# Patient Record
Sex: Female | Born: 1960 | Race: Black or African American | Hispanic: No | Marital: Single | Smoking: Never smoker
Health system: Southern US, Community
[De-identification: ages and names within clinical notes are randomized; demographics above are authoritative.]

## PROBLEM LIST (undated history)

## (undated) DIAGNOSIS — T7840XA Allergy, unspecified, initial encounter: Secondary | ICD-10-CM

## (undated) DIAGNOSIS — K5900 Constipation, unspecified: Secondary | ICD-10-CM

## (undated) DIAGNOSIS — M1990 Unspecified osteoarthritis, unspecified site: Secondary | ICD-10-CM

## (undated) DIAGNOSIS — T8859XA Other complications of anesthesia, initial encounter: Secondary | ICD-10-CM

## (undated) DIAGNOSIS — J45909 Unspecified asthma, uncomplicated: Secondary | ICD-10-CM

## (undated) DIAGNOSIS — H9193 Unspecified hearing loss, bilateral: Secondary | ICD-10-CM

## (undated) DIAGNOSIS — R32 Unspecified urinary incontinence: Secondary | ICD-10-CM

## (undated) DIAGNOSIS — T4145XA Adverse effect of unspecified anesthetic, initial encounter: Secondary | ICD-10-CM

## (undated) DIAGNOSIS — R12 Heartburn: Secondary | ICD-10-CM

## (undated) DIAGNOSIS — N946 Dysmenorrhea, unspecified: Secondary | ICD-10-CM

## (undated) DIAGNOSIS — J189 Pneumonia, unspecified organism: Secondary | ICD-10-CM

## (undated) DIAGNOSIS — F53 Postpartum depression: Secondary | ICD-10-CM

## (undated) DIAGNOSIS — E669 Obesity, unspecified: Secondary | ICD-10-CM

## (undated) DIAGNOSIS — G43909 Migraine, unspecified, not intractable, without status migrainosus: Secondary | ICD-10-CM

## (undated) DIAGNOSIS — E7800 Pure hypercholesterolemia, unspecified: Secondary | ICD-10-CM

## (undated) DIAGNOSIS — E119 Type 2 diabetes mellitus without complications: Secondary | ICD-10-CM

## (undated) DIAGNOSIS — Q632 Ectopic kidney: Secondary | ICD-10-CM

## (undated) DIAGNOSIS — K635 Polyp of colon: Secondary | ICD-10-CM

## (undated) DIAGNOSIS — O99345 Other mental disorders complicating the puerperium: Secondary | ICD-10-CM

## (undated) DIAGNOSIS — D649 Anemia, unspecified: Secondary | ICD-10-CM

## (undated) HISTORY — DX: Migraine, unspecified, not intractable, without status migrainosus: G43.909

## (undated) HISTORY — DX: Unspecified asthma, uncomplicated: J45.909

## (undated) HISTORY — DX: Ectopic kidney: Q63.2

## (undated) HISTORY — DX: Constipation, unspecified: K59.00

## (undated) HISTORY — DX: Postpartum depression: F53.0

## (undated) HISTORY — DX: Obesity, unspecified: E66.9

## (undated) HISTORY — DX: Pure hypercholesterolemia, unspecified: E78.00

## (undated) HISTORY — DX: Polyp of colon: K63.5

## (undated) HISTORY — DX: Unspecified osteoarthritis, unspecified site: M19.90

## (undated) HISTORY — DX: Pneumonia, unspecified organism: J18.9

## (undated) HISTORY — DX: Type 2 diabetes mellitus without complications: E11.9

## (undated) HISTORY — DX: Allergy, unspecified, initial encounter: T78.40XA

## (undated) HISTORY — DX: Other mental disorders complicating the puerperium: O99.345

## (undated) HISTORY — DX: Unspecified urinary incontinence: R32

## (undated) HISTORY — DX: Anemia, unspecified: D64.9

## (undated) HISTORY — PX: HEMORRHOID BANDING: SHX5850

## (undated) HISTORY — DX: Heartburn: R12

## (undated) HISTORY — PX: TYMPANOSTOMY TUBE PLACEMENT: SHX32

## (undated) HISTORY — DX: Dysmenorrhea, unspecified: N94.6

---

## 1999-04-21 ENCOUNTER — Other Ambulatory Visit: Admission: RE | Admit: 1999-04-21 | Discharge: 1999-05-21 | Payer: Self-pay | Admitting: *Deleted

## 1999-04-21 ENCOUNTER — Encounter: Admission: RE | Admit: 1999-04-21 | Discharge: 1999-04-21 | Payer: Self-pay | Admitting: Family Medicine

## 1999-05-26 ENCOUNTER — Encounter: Admission: RE | Admit: 1999-05-26 | Discharge: 1999-05-26 | Payer: Self-pay | Admitting: Family Medicine

## 1999-10-15 ENCOUNTER — Encounter: Admission: RE | Admit: 1999-10-15 | Discharge: 1999-10-15 | Payer: Self-pay | Admitting: Family Medicine

## 2000-02-07 ENCOUNTER — Encounter: Admission: RE | Admit: 2000-02-07 | Discharge: 2000-02-07 | Payer: Self-pay | Admitting: Family Medicine

## 2000-04-19 ENCOUNTER — Encounter: Admission: RE | Admit: 2000-04-19 | Discharge: 2000-04-19 | Payer: Self-pay | Admitting: Family Medicine

## 2000-06-12 ENCOUNTER — Encounter: Admission: RE | Admit: 2000-06-12 | Discharge: 2000-06-12 | Payer: Self-pay | Admitting: Family Medicine

## 2000-09-26 ENCOUNTER — Encounter: Payer: Self-pay | Admitting: Emergency Medicine

## 2000-09-26 ENCOUNTER — Emergency Department (HOSPITAL_COMMUNITY): Admission: EM | Admit: 2000-09-26 | Discharge: 2000-09-26 | Payer: Self-pay | Admitting: Emergency Medicine

## 2000-09-28 ENCOUNTER — Encounter: Admission: RE | Admit: 2000-09-28 | Discharge: 2000-09-28 | Payer: Self-pay | Admitting: Family Medicine

## 2000-12-06 ENCOUNTER — Encounter: Admission: RE | Admit: 2000-12-06 | Discharge: 2000-12-06 | Payer: Self-pay | Admitting: Family Medicine

## 2000-12-06 ENCOUNTER — Encounter: Payer: Self-pay | Admitting: Sports Medicine

## 2000-12-06 ENCOUNTER — Encounter: Admission: RE | Admit: 2000-12-06 | Discharge: 2000-12-06 | Payer: Self-pay | Admitting: Sports Medicine

## 2000-12-12 ENCOUNTER — Encounter: Admission: RE | Admit: 2000-12-12 | Discharge: 2000-12-12 | Payer: Self-pay | Admitting: Family Medicine

## 2000-12-12 ENCOUNTER — Ambulatory Visit (HOSPITAL_COMMUNITY): Admission: RE | Admit: 2000-12-12 | Discharge: 2000-12-12 | Payer: Self-pay | Admitting: *Deleted

## 2000-12-19 ENCOUNTER — Encounter: Admission: RE | Admit: 2000-12-19 | Discharge: 2000-12-19 | Payer: Self-pay | Admitting: *Deleted

## 2000-12-26 ENCOUNTER — Encounter: Admission: RE | Admit: 2000-12-26 | Discharge: 2000-12-26 | Payer: Self-pay | Admitting: Family Medicine

## 2001-01-02 ENCOUNTER — Encounter: Admission: RE | Admit: 2001-01-02 | Discharge: 2001-01-22 | Payer: Self-pay | Admitting: Sports Medicine

## 2001-01-16 ENCOUNTER — Encounter: Admission: RE | Admit: 2001-01-16 | Discharge: 2001-01-16 | Payer: Self-pay | Admitting: Family Medicine

## 2001-04-16 ENCOUNTER — Encounter: Admission: RE | Admit: 2001-04-16 | Discharge: 2001-04-16 | Payer: Self-pay | Admitting: Family Medicine

## 2001-04-18 ENCOUNTER — Encounter: Admission: RE | Admit: 2001-04-18 | Discharge: 2001-04-18 | Payer: Self-pay | Admitting: Family Medicine

## 2001-04-25 ENCOUNTER — Encounter: Admission: RE | Admit: 2001-04-25 | Discharge: 2001-04-25 | Payer: Self-pay | Admitting: Family Medicine

## 2001-05-15 ENCOUNTER — Other Ambulatory Visit: Admission: RE | Admit: 2001-05-15 | Discharge: 2001-05-15 | Payer: Self-pay | Admitting: Internal Medicine

## 2003-02-28 ENCOUNTER — Encounter: Payer: Self-pay | Admitting: Internal Medicine

## 2003-02-28 ENCOUNTER — Other Ambulatory Visit: Admission: RE | Admit: 2003-02-28 | Discharge: 2003-02-28 | Payer: Self-pay | Admitting: Internal Medicine

## 2003-02-28 LAB — CONVERTED CEMR LAB

## 2005-07-29 ENCOUNTER — Encounter: Admission: RE | Admit: 2005-07-29 | Discharge: 2005-07-29 | Payer: Self-pay | Admitting: Internal Medicine

## 2006-08-02 ENCOUNTER — Encounter: Admission: RE | Admit: 2006-08-02 | Discharge: 2006-08-02 | Payer: Self-pay | Admitting: Internal Medicine

## 2006-08-11 ENCOUNTER — Encounter: Admission: RE | Admit: 2006-08-11 | Discharge: 2006-08-11 | Payer: Self-pay | Admitting: Internal Medicine

## 2006-12-12 ENCOUNTER — Ambulatory Visit: Payer: Self-pay | Admitting: Internal Medicine

## 2006-12-12 LAB — CONVERTED CEMR LAB
AST: 16 units/L (ref 0–37)
Albumin: 3.4 g/dL — ABNORMAL LOW (ref 3.5–5.2)
Alkaline Phosphatase: 64 units/L (ref 39–117)
Basophils Relative: 0.2 % (ref 0.0–1.0)
Bilirubin, Direct: 0.1 mg/dL (ref 0.0–0.3)
Cholesterol: 161 mg/dL (ref 0–200)
Creatinine, Ser: 1 mg/dL (ref 0.4–1.2)
GFR calc Af Amer: 77 mL/min
GFR calc non Af Amer: 64 mL/min
HCT: 34.3 % — ABNORMAL LOW (ref 36.0–46.0)
HDL: 43.8 mg/dL (ref >39.0)
Hemoglobin: 11.5 g/dL — ABNORMAL LOW (ref 12.0–15.0)
MCHC: 33.5 g/dL (ref 30.0–36.0)
MCV: 73.3 fL — ABNORMAL LOW (ref 78.0–100.0)
Neutrophils Relative %: 59.1 % (ref 43.0–77.0)
RBC: 4.68 M/uL (ref 3.87–5.11)
RDW: 16.6 % — ABNORMAL HIGH (ref 11.5–14.6)
Sodium: 140 meq/L (ref 135–145)
Total CHOL/HDL Ratio: 3.7
WBC: 6 10*3/uL (ref 4.5–10.5)

## 2006-12-19 ENCOUNTER — Ambulatory Visit: Payer: Self-pay | Admitting: Internal Medicine

## 2007-01-31 ENCOUNTER — Encounter: Payer: Self-pay | Admitting: Internal Medicine

## 2007-01-31 DIAGNOSIS — N943 Premenstrual tension syndrome: Secondary | ICD-10-CM | POA: Insufficient documentation

## 2007-01-31 DIAGNOSIS — J45909 Unspecified asthma, uncomplicated: Secondary | ICD-10-CM | POA: Insufficient documentation

## 2007-01-31 DIAGNOSIS — K219 Gastro-esophageal reflux disease without esophagitis: Secondary | ICD-10-CM | POA: Insufficient documentation

## 2007-03-12 ENCOUNTER — Ambulatory Visit: Payer: Self-pay | Admitting: Internal Medicine

## 2007-03-12 ENCOUNTER — Telehealth: Payer: Self-pay | Admitting: Internal Medicine

## 2007-03-12 DIAGNOSIS — T753XXA Motion sickness, initial encounter: Secondary | ICD-10-CM | POA: Insufficient documentation

## 2007-04-16 ENCOUNTER — Telehealth: Payer: Self-pay | Admitting: Internal Medicine

## 2007-05-14 ENCOUNTER — Other Ambulatory Visit: Admission: RE | Admit: 2007-05-14 | Discharge: 2007-05-14 | Payer: Self-pay | Admitting: Obstetrics and Gynecology

## 2007-06-01 ENCOUNTER — Ambulatory Visit: Payer: Self-pay | Admitting: Internal Medicine

## 2007-06-01 DIAGNOSIS — G479 Sleep disorder, unspecified: Secondary | ICD-10-CM | POA: Insufficient documentation

## 2007-07-13 ENCOUNTER — Encounter: Payer: Self-pay | Admitting: Internal Medicine

## 2007-07-13 ENCOUNTER — Ambulatory Visit (HOSPITAL_BASED_OUTPATIENT_CLINIC_OR_DEPARTMENT_OTHER): Admission: RE | Admit: 2007-07-13 | Discharge: 2007-07-13 | Payer: Self-pay | Admitting: Internal Medicine

## 2007-07-25 ENCOUNTER — Encounter: Payer: Self-pay | Admitting: Critical Care Medicine

## 2007-07-25 ENCOUNTER — Ambulatory Visit: Payer: Self-pay | Admitting: Pulmonary Disease

## 2007-07-30 ENCOUNTER — Encounter: Payer: Self-pay | Admitting: Internal Medicine

## 2007-11-20 ENCOUNTER — Telehealth: Payer: Self-pay | Admitting: Internal Medicine

## 2007-11-20 ENCOUNTER — Ambulatory Visit: Payer: Self-pay | Admitting: Internal Medicine

## 2007-12-07 ENCOUNTER — Telehealth: Payer: Self-pay | Admitting: Internal Medicine

## 2009-04-17 ENCOUNTER — Encounter: Payer: Self-pay | Admitting: *Deleted

## 2009-04-17 ENCOUNTER — Encounter: Payer: Self-pay | Admitting: Internal Medicine

## 2009-04-17 LAB — CONVERTED CEMR LAB
Creatinine, Ser: 0.92 mg/dL
LDL Cholesterol: 117 mg/dL
TSH: 0.937 microintl units/mL
Triglycerides: 50 mg/dL

## 2009-04-22 ENCOUNTER — Telehealth: Payer: Self-pay | Admitting: *Deleted

## 2009-05-19 ENCOUNTER — Ambulatory Visit: Payer: Self-pay | Admitting: Internal Medicine

## 2009-05-19 DIAGNOSIS — G4733 Obstructive sleep apnea (adult) (pediatric): Secondary | ICD-10-CM | POA: Insufficient documentation

## 2009-05-19 DIAGNOSIS — R5381 Other malaise: Secondary | ICD-10-CM | POA: Insufficient documentation

## 2009-05-19 DIAGNOSIS — R5383 Other fatigue: Secondary | ICD-10-CM

## 2009-05-19 DIAGNOSIS — E119 Type 2 diabetes mellitus without complications: Secondary | ICD-10-CM | POA: Insufficient documentation

## 2009-05-19 LAB — CONVERTED CEMR LAB: Blood Glucose, Fingerstick: 146

## 2009-05-22 LAB — CONVERTED CEMR LAB
CO2: 27 meq/L (ref 19–32)
GFR calc non Af Amer: 86.26 mL/min (ref >60)
Microalb Creat Ratio: 2.1 mg/g (ref 0.0–30.0)
Microalb, Ur: 0.3 mg/dL (ref 0.0–1.9)
Potassium: 4.1 meq/L (ref 3.5–5.1)

## 2009-06-11 ENCOUNTER — Encounter: Payer: Self-pay | Admitting: *Deleted

## 2009-06-18 ENCOUNTER — Ambulatory Visit: Payer: Self-pay | Admitting: Internal Medicine

## 2009-06-18 DIAGNOSIS — R1013 Epigastric pain: Secondary | ICD-10-CM | POA: Insufficient documentation

## 2009-06-23 ENCOUNTER — Ambulatory Visit: Payer: Self-pay | Admitting: Internal Medicine

## 2009-06-23 ENCOUNTER — Encounter: Admission: RE | Admit: 2009-06-23 | Discharge: 2009-06-23 | Payer: Self-pay | Admitting: Internal Medicine

## 2009-07-30 ENCOUNTER — Ambulatory Visit: Payer: Self-pay | Admitting: Internal Medicine

## 2009-07-30 DIAGNOSIS — R0989 Other specified symptoms and signs involving the circulatory and respiratory systems: Secondary | ICD-10-CM

## 2009-07-30 DIAGNOSIS — M25519 Pain in unspecified shoulder: Secondary | ICD-10-CM | POA: Insufficient documentation

## 2009-07-30 DIAGNOSIS — R0609 Other forms of dyspnea: Secondary | ICD-10-CM | POA: Insufficient documentation

## 2009-07-30 LAB — CONVERTED CEMR LAB: Hemoglobin: 13.3 g/dL

## 2009-08-12 ENCOUNTER — Ambulatory Visit: Payer: Self-pay | Admitting: Pulmonary Disease

## 2009-08-14 ENCOUNTER — Ambulatory Visit: Payer: Self-pay | Admitting: Pulmonary Disease

## 2009-08-21 ENCOUNTER — Ambulatory Visit: Payer: Self-pay | Admitting: Pulmonary Disease

## 2009-08-26 ENCOUNTER — Ambulatory Visit (HOSPITAL_COMMUNITY): Admission: RE | Admit: 2009-08-26 | Discharge: 2009-08-26 | Payer: Self-pay | Admitting: Pulmonary Disease

## 2009-09-07 ENCOUNTER — Telehealth: Payer: Self-pay | Admitting: *Deleted

## 2009-12-28 ENCOUNTER — Telehealth: Payer: Self-pay | Admitting: *Deleted

## 2010-01-15 ENCOUNTER — Encounter: Admission: RE | Admit: 2010-01-15 | Discharge: 2010-01-15 | Payer: Self-pay | Admitting: Otolaryngology

## 2010-08-29 ENCOUNTER — Encounter: Payer: Self-pay | Admitting: Internal Medicine

## 2010-09-09 NOTE — Assessment & Plan Note (Signed)
Summary: consult for dyspnea   Copy to:  Berniece Andreas Primary Provider/Referring Provider:  Madelin Headings MD  CC:  Pulmonary Consult.  History of Present Illness: The pt is a 50y/o female who I have been asked to see for dyspnea.  She states intermittantly for the past 2 mos, she has had the feeling of sob, as well as chest heaviness with no restriction to airflow.  It occurs primarily with exertion, but it does not limit her ability to do things.  It is just "something I notice".  She describes a 3 block doe at moderate pace on flat ground, but denies any issues with housework or bringing groceries in from the car.  She notices her abnormal breathing the most when she is "stressed".  Her friends will often ask her why she is breathing so heavy.  The pt has chronic LE edema, but denies any chest pain.  She tells me that her weight has been neutral over the past one year.  There is a question whether she has had asthma in the past.  Her recent cxr was unremarkable.  Current Medications (verified): 1)  Norco 5-325 Mg  Tabs (Hydrocodone-Acetaminophen) .Marland Kitchen.. 1-2 By Mouth Q4-6 Hours As Needed  Headache 2)  Maxalt-Mlt 10 Mg  Tbdp (Rizatriptan Benzoate) .Marland Kitchen.. 1 By Mouth As Needed For Headache. Can Repeat in 2 Hours. 3)  Zyrtec Hives Relief 10 Mg Tabs (Cetirizine Hcl) .... As Needed 4)  Naproxen 500 Mg Tabs (Naproxen) .Marland Kitchen.. 1 By Mouth Two Times A Day For Shoulder Neck Pain  Allergies (verified): No Known Drug Allergies  Past History:  Past Medical History: Reviewed history from 07/30/2009 and no changes required. 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 Family History of Arthritis Family History Diabetes gm Family History Hypertension Father :   deceased from MVA   Mom   arthrits  HT   allergies: mother asthma: mother heart disease: maternal grandmother clotting disorders: maternal grandmother (stroke)   Social History: Reviewed history from 07/30/2009 and no changes required. Single pt has child(ren) pt works as Theme park manager  Never Smoked Alcohol use-no Drug use-no  HH of 3   no pets   Review of Systems       The patient complains of shortness of breath with activity, shortness of breath at rest, sore throat, headaches, and nasal congestion/difficulty breathing through nose.  The patient denies productive cough, non-productive cough, coughing up blood, chest pain, irregular heartbeats, acid heartburn, indigestion, loss of appetite, weight change, abdominal pain, difficulty swallowing, tooth/dental problems, sneezing, itching, ear ache, anxiety, depression, hand/feet swelling, joint stiffness or pain, rash, change in color of mucus, and fever.    Vital Signs:  Patient profile:   50 year old female Menstrual status:  irregular Height:      62 inches Weight:      267.25 pounds BMI:     49.06 O2 Sat:      97 % on Room air Temp:     98.3 degrees F oral Pulse rate:   108 / minute BP sitting:   116 / 80  (left arm) Cuff size:   large  Vitals Entered By: Arman Filter LPN (  August 12, 2009 2:30 PM)  O2 Flow:  Room air CC: Pulmonary Consult Comments Medications reviewed with patient Arman Filter LPN  August 12, 2009 2:41 PM    Physical Exam  General:  obese female in nad Eyes:  PERRLA and EOMI.   Nose:  patent without discharge, mild turbinate hypertrophy Mouth:  elongation of soft palate and uvula Neck:  no jvd, LN, tmg Lungs:  totally clear to auscultation Heart:  rrr, no mrg Abdomen:  soft and nontender, bs+ Extremities:  1+ edema with venous  stasis changes no cyanosis pulses intact distally Neurologic:  alert and oriented, moves all 4.   Impression & Recommendations:  Problem # 1:  DYSPNEA ON EXERTION (ICD-786.09) the pt is having a "sensation" of shortness of breath after heavier exertional activities, but does not feel it limits her activities in anyway.  She has a clear cxr and lung exam, but does have centripetal obesity that more than likely limits diaphragm excursion.  At this point, I think it is worthwhile to do pfts to make sure she doesn't have airflow obstruction (i.e. asthma) or a significant DLCO abnormality.  If these are ok, I would recommend working on weight loss and some type of exercise program for 3-4 months.  If she is successful, but her symptoms persist, would do echo for completeness (also to rule out pulmonary htn).  The pt is agreeable to this approach.  Medications Added to Medication List This Visit: 1)  Zyrtec Hives Relief 10 Mg Tabs (Cetirizine hcl) .... As needed  Other Orders: Consultation Level IV (82956) Pulmonary Referral (Pulmonary)  Patient Instructions: 1)  will set up for breathing studies, and call you with the results 2)  work on weight loss and conditioning.

## 2010-09-09 NOTE — Miscellaneous (Signed)
Summary: Orders Update pft charges  Clinical Lists Changes  Orders: Added new Service order of Carbon Monoxide diffusing w/capacity [phone]) - Signed Added new Service order of Lung Volumes (57846) - Signed Added new Service order of Spirometry (Pre & Post) [phone]) - Signed  Appended Document: Orders Update pft charges megan, pt needs ov to discuss pft results.  Appended Document: Orders Update pft charges [employer]  Appended Document: Orders Update pft charges called and spoke with pt.  pt scheduled to see [facility] tomorrow at 2pm

## 2010-09-09 NOTE — Assessment & Plan Note (Signed)
Summary: ov to discuss PFT results/mg   Copy to:  Berniece Andreas Primary Provider/Referring Provider:  Madelin Headings MD  CC:  Pt is here for a f/u appt to discuss PFT results.  .  History of Present Illness: The pt comes in today for f/u of her recent pfts as part of a workup for dyspnea.  She was found to have normal spirometry and no response to bronchodilators.  Her lung volumes showed no restriction, but there was mild airtrapping.  Her DLCO was moderately reduced, but corrected to normal with alveolar volume adjustment.  I have gone over the study with her in detail, and answered all of her questions.  Current Medications (verified): 1)  Norco 5-325 Mg  Tabs (Hydrocodone-Acetaminophen) .Marland Kitchen.. 1-2 By Mouth Q4-6 Hours As Needed  Headache 2)  Maxalt-Mlt 10 Mg  Tbdp (Rizatriptan Benzoate) .Marland Kitchen.. 1 By Mouth As Needed For Headache. Can Repeat in 2 Hours. 3)  Zyrtec Hives Relief 10 Mg Tabs (Cetirizine Hcl) .... As Needed 4)  Naproxen 500 Mg Tabs (Naproxen) .Marland Kitchen.. 1 By Mouth Two Times A Day For Shoulder Neck Pain 5)  Metformin Hcl 500 Mg Tabs (Metformin Hcl) .... Take 1 Tablet By Mouth Once A Day  Allergies (verified): No Known Drug Allergies  Vital Signs:  Patient profile:   50 year old female Menstrual status:  irregular Height:      62 inches Weight:      268 pounds BMI:     49.20 O2 Sat:      97 % on Room air Temp:     98.1 degrees F oral Pulse rate:   95 / minute BP sitting:   118 / 84  (left arm) Cuff size:   large  Vitals Entered By: Arman Filter LPN (August 21, 2009 1:58 PM)  O2 Flow:  Room air CC: Pt is here for a f/u appt to discuss PFT results.   Comments Medications reviewed with patient Arman Filter LPN  August 21, 2009 1:59 PM     Impression & Recommendations:  Problem # 1:  DYSPNEA ON EXERTION (ICD-786.09) the pt has no obvious obstruction by her spirometry, but does have mild airtrapping on her lung volumes.  This raises the question whether she may have  occult obstructive airways disease, but it is far from straightforward.  I think it is worthwhile to try her on an inhaler for asthma and see if she has improvement.  My other concern is that her DLCO is abnormal, and she has issues with LE edema.  I think we need to exclude the possibility of chronic thromboembolic disease, and will check v/q scan.  If the pt does not improve with her inhaler, and the v/q is ok, would do echo to r/o the possibility of pulmonary htn.  She is agreeable to this approach.  Time spent with pt today was .  Medications Added to Medication List This Visit: 1)  Metformin Hcl 500 Mg Tabs (Metformin hcl) .... Take 1 tablet by mouth once a day  Other Orders: Est. Patient Level III (29562) Radiology Referral (Radiology)  Patient Instructions: 1)  will start on dulera 100/58mcg  2 inhalations am and pm...rinse mouth well. 2)  will schedule for lung scan to exclude small blood clots.  I will call with results. 3)  call me in 3 weeks with response to dulera.

## 2010-09-09 NOTE — Progress Notes (Signed)
Summary: machine & strips  Phone Note Call from Patient Call back at Home Phone [phone]   Caller: vm Call For: panosh Summary of Call: Have coupon for free meter One Touch Ultra Mini machine & also need strips One Touch Ultra Blue Test Strips.  Now that I'm diabetic need machine.   [facility] [address]   Initial call taken by: Rudy Jew, RN,  September 07, 2009 2:29 PM  Follow-up for Phone Call        Left message for pt to call back. I need to know how often she is testing her blood sugar so we can call in the rx. Follow-up by: Romualdo Bolk, CMA Duncan Dull),  September 07, 2009 4:46 PM  Additional Follow-up for Phone Call Additional follow up Details #1::        Pt wants to know how often you would like her to check her blood sugar? Additional Follow-up by: Romualdo Bolk, CMA (AAMA),  September 09, 2009 4:17 PM    Additional Follow-up for Phone Call Additional follow up Details #2::    two times a day for 1-2 weeks then daily at various times.  Follow-up by: Madelin Headings MD,  September 09, 2009 5:44 PM  Additional Follow-up for Phone Call Additional follow up Details #3:: Details for Additional Follow-up Action Taken: Rx sent to pharmacy. Pt aware Additional Follow-up by: Romualdo Bolk, CMA (AAMA),  September 10, 2009 11:22 AM  New/Updated Medications: ONETOUCH ULTRA MINI W/DEVICE KIT (BLOOD GLUCOSE MONITORING SUPPL) check blood sugar two times a day for 1-2 weeks then daily at various times ONETOUCH ULTRA TEST  STRP (GLUCOSE BLOOD) check blood sugar two times a day for 1-2 weeks then daily at various times Prescriptions: ONETOUCH ULTRA TEST  STRP (GLUCOSE BLOOD) check blood sugar two times a day for 1-2 weeks then daily at various times  #50 x 11   Entered by:   Romualdo Bolk, CMA (AAMA)   Authorized by:   Madelin Headings MD   Signed by:   Romualdo Bolk, CMA (AAMA) on 09/10/2009   Method used:   [facility]        [facility]  [address] [phone]* (retail)       [address]       [place], [place]  [id number]       Ph: [phone] or [phone]       Fax: [phone]   RxID:   [id number] [facility] ULTRA MINI W/DEVICE KIT (BLOOD GLUCOSE MONITORING SUPPL) check blood sugar two times a day for 1-2 weeks then daily at various times  #1 x 0   Entered by:   Romualdo Bolk, CMA (AAMA)   Authorized by:   Madelin Headings MD   Signed by:   Romualdo Bolk, CMA (AAMA) on 09/10/2009   Method used:   [facility]        [facility]  [address] [phone]* (retail)       [address]       [place], [place]  [id number]       Ph: [phone] or [phone]       Fax: [phone]   RxID:   [id number]

## 2010-09-09 NOTE — Progress Notes (Signed)
Summary: Anielle Headrick please call  Phone Note Call from Patient Call back at Home Phone [phone]   Caller: Patient Call For: Madelin Headings MD Summary of Call: Pt would like Wendell Fiebig to return her call concerning diabete machine.  Initial call taken by: Heron Sabins,  Dec 28, 2009 11:13 AM  Follow-up for Phone Call        Pt states that she has pays $200.00 up front before they will cover anything. Follow-up by: Romualdo Bolk, CMA Duncan Dull),  Dec 28, 2009 11:22 AM  Additional Follow-up for Phone Call Additional follow up Details #1::        I found a Accu-Chek that has a connect card. This card states that you pay no more than $15.00 for a prescription. I gave this to her with some test strips. Additional Follow-up by: Romualdo Bolk, CMA (AAMA),  Dec 28, 2009 11:59 AM    New/Updated Medications: ACCU-CHEK AVIVA  KIT (BLOOD GLUCOSE MONITORING SUPPL)

## 2010-12-21 NOTE — Procedures (Signed)
Jill [name], [place] NO.:  [id number]   MEDICAL RECORD NO.:  [id number]          PATIENT TYPE:  OUT   LOCATION:  SLEEP CENTER                 FACILITY:  [facility]   PHYSICIAN:  Barbaraann Share, MD,FCCPDATE OF BIRTH:  1960/11/05   DATE OF STUDY:  07/13/2007                            NOCTURNAL POLYSOMNOGRAM   REFERRING PHYSICIAN:  Neta Mends. Panosh, MD   INDICATION FOR STUDY:  Hypersomnia with sleep apnea.   EPWORTH SLEEPINESS SCORE:  13.   SLEEP ARCHITECTURE:  The patient had a total sleep time of 335 minutes  with very little slow-wave sleep or REM.  Sleep onset latency was normal  at 17 minutes, and REM onset was prolonged at 165 minutes.  Sleep  efficiency was poor.   RESPIRATORY DATA:  The patient was found to have 212 obstructive apneas,  1 central apnea, and 45 hypopneas during the first 158 minutes of sleep.  Because of the severity of her sleep apnea, she met the emergency split  night protocol.  She therefore had an apnea/hypopnea index of 98 events  per hour during the diagnostic portion of the study.  The events were  not positional and there was very loud snoring noted.  By protocol, the  patient was placed on CPAP, with the mask type not documented by the  sleep technician.  Pressure was initiated at 5 cm of water and gradually  increased in order to treat the patient's obstructive events.  At a  final pressure of 15 cm, patient had a significant reduction in her  sleep apnea but she continued to have breakthrough events.  She also had  difficulties with mask leaking and this may explain the breakthrough  events.   OXYGEN DATA:  The patient had O2 desaturation as low as 73% with her  obstructive events.   CARDIAC DATA:  No clinically significant cardiac arrhythmias were noted.   MOVEMENT-PARASOMNIA:  There were no clinically significant leg jerks nor  abnormal behaviors noted   IMPRESSIONS-RECOMMENDATIONS:  Very severe obstructive sleep  apnea/hypopnea syndrome with an apnea/hypopnea index of 98 events per  hour noted in the first 2-1/2 hours of sleep.  The patient met the  emergency split night protocol because of the large numbers of events  and O2 desaturations as low as 73%.  She was placed on CPAP and titrated  to a pressure of 15 cm of water, however, continued to have persistent  breakthrough.  Patient did have trouble with mask leaking and this may  be responsible for her breakthrough events.  There was really not  enough time during the night to get her CPAP to an optimal level.  I  would highly recommend returning to the sleep lab given the severity of  her sleep apnea for optimal titration and mask fitting.      Barbaraann Share, MD,FCCP  Diplomate, American Board of Sleep  Medicine  Electronically Signed     [facility]/MEDQ  D:  07/25/2007 08:41:21  T:  07/25/2007 12:39:19  Job:  [id number]

## 2012-08-20 ENCOUNTER — Other Ambulatory Visit: Payer: Self-pay | Admitting: Internal Medicine

## 2012-08-20 DIAGNOSIS — Z1231 Encounter for screening mammogram for malignant neoplasm of breast: Secondary | ICD-10-CM

## 2012-09-13 ENCOUNTER — Encounter: Payer: Self-pay | Admitting: Family Medicine

## 2012-09-13 ENCOUNTER — Ambulatory Visit
Admission: RE | Admit: 2012-09-13 | Discharge: 2012-09-13 | Disposition: A | Discharge status: Home / Self Care | Payer: 59 | Source: Ambulatory Visit | Attending: Internal Medicine | Admitting: Internal Medicine

## 2012-09-13 ENCOUNTER — Other Ambulatory Visit: Payer: Self-pay | Admitting: Family Medicine

## 2012-09-13 ENCOUNTER — Ambulatory Visit (INDEPENDENT_AMBULATORY_CARE_PROVIDER_SITE_OTHER): Payer: 59 | Admitting: Family Medicine

## 2012-09-13 VITALS — BP 132/73 | HR 94 | Temp 98.5°F | Resp 16 | Ht 63.0 in | Wt 272.0 lb

## 2012-09-13 DIAGNOSIS — Z130 Encounter for screening for diseases of the blood and blood-forming organs and certain disorders involving the immune mechanism: Secondary | ICD-10-CM

## 2012-09-13 DIAGNOSIS — L258 Unspecified contact dermatitis due to other agents: Secondary | ICD-10-CM

## 2012-09-13 DIAGNOSIS — Z1231 Encounter for screening mammogram for malignant neoplasm of breast: Secondary | ICD-10-CM

## 2012-09-13 DIAGNOSIS — Z0000 Encounter for general adult medical examination without abnormal findings: Secondary | ICD-10-CM

## 2012-09-13 DIAGNOSIS — L853 Xerosis cutis: Secondary | ICD-10-CM

## 2012-09-13 DIAGNOSIS — Z13228 Encounter for screening for other metabolic disorders: Secondary | ICD-10-CM

## 2012-09-13 DIAGNOSIS — Z23 Encounter for immunization: Secondary | ICD-10-CM

## 2012-09-13 DIAGNOSIS — Z1329 Encounter for screening for other suspected endocrine disorder: Secondary | ICD-10-CM

## 2012-09-13 LAB — CBC WITH DIFFERENTIAL/PLATELET
Basophils Absolute: 0 10*3/uL (ref 0.0–0.1)
Basophils Relative: 0 % (ref 0–1)
HCT: 36.5 % (ref 36.0–46.0)
Lymphocytes Relative: 35 % (ref 12–46)
Lymphs Abs: 2.3 10*3/uL (ref 0.7–4.0)
MCH: 23.8 pg — ABNORMAL LOW (ref 26.0–34.0)
MCHC: 32.3 g/dL (ref 30.0–36.0)
MCV: 73.7 fL — ABNORMAL LOW (ref 78.0–100.0)
Monocytes Absolute: 0.3 10*3/uL (ref 0.1–1.0)
Neutrophils Relative %: 59 % (ref 43–77)
RBC: 4.95 MIL/uL (ref 3.87–5.11)
RDW: 16.1 % — ABNORMAL HIGH (ref 11.5–15.5)

## 2012-09-13 LAB — HEPATITIS C ANTIBODY: HCV Ab: NEGATIVE

## 2012-09-13 LAB — POCT URINALYSIS DIPSTICK
Bilirubin, UA: NEGATIVE
Blood, UA: NEGATIVE
Ketones, UA: NEGATIVE
Nitrite, UA: NEGATIVE
pH, UA: 5.5

## 2012-09-13 LAB — LIPID PANEL: Cholesterol: 171 mg/dL (ref 0–200)

## 2012-09-13 LAB — COMPREHENSIVE METABOLIC PANEL
ALT: 16 U/L (ref 0–35)
BUN: 13 mg/dL (ref 6–23)
CO2: 25 mEq/L (ref 19–32)
Calcium: 9.3 mg/dL (ref 8.4–10.5)
Creat: 0.84 mg/dL (ref 0.50–1.10)
Total Bilirubin: 0.3 mg/dL (ref 0.3–1.2)
Total Protein: 7.4 g/dL (ref 6.0–8.3)

## 2012-09-13 LAB — IFOBT (OCCULT BLOOD): IFOBT: NEGATIVE

## 2012-09-13 LAB — T3, FREE: T3, Free: 3.4 pg/mL (ref 2.3–4.2)

## 2012-09-13 NOTE — Patient Instructions (Signed)
Keeping You Healthy  Get These Tests  Blood Pressure- Have your blood pressure checked by your healthcare provider at least once a year.  Normal blood pressure is 120/80.  Weight- Have your body mass index (BMI) calculated to screen for obesity.  BMI is a measure of body fat based on height and weight.  You can calculate your own BMI at https://www.west-esparza.com/  Cholesterol- Have your cholesterol checked every year.  Diabetes- Have your blood sugar checked every year if you have high blood pressure, high cholesterol, a family history of diabetes or if you are overweight.  Pap Smear- Have a pap smear every 1 to 3 years if you have been sexually active.  If you are older than 65 and recent pap smears have been normal you may not need additional pap smears.  In addition, if you have had a hysterectomy  For benign disease additional pap smears are not necessary.  Mammogram-Yearly mammograms are essential for early detection of breast cancer  Screening for Colon Cancer- Colonoscopy starting at age 30. Screening may begin sooner depending on your family history and other health conditions.  Follow up colonoscopy as directed by your Gastroenterologist.  Screening for Osteoporosis- Screening begins at age 100 with bone density scanning, sooner if you are at higher risk for developing Osteoporosis.  Get these medicines  Calcium with Vitamin D- Your body requires 1200-1500 mg of Calcium a day and [phone] IU of Vitamin D a day.  You can only absorb 500 mg of Calcium at a time therefore Calcium must be taken in 2 or 3 separate doses throughout the day.  Hormones- Hormone therapy has been associated with increased risk for certain cancers and heart disease.  Talk to your healthcare provider about if you need relief from menopausal symptoms.  Aspirin- Ask your healthcare provider about taking Aspirin to prevent Heart Disease and Stroke.  Get these Immuniztions  Flu shot- Every fall  Pneumonia  shot- Once after the age of 50; if you are younger ask your healthcare provider if you need a pneumonia shot.  Tetanus- Every ten years.  You received a Tdap today; next regular Tetanus is due in 2024.  Zostavax- Once after the age of 32 to prevent shingles.  Take these steps  Don't smoke- Your healthcare provider can help you quit. For tips on how to quit, ask your healthcare provider or go to www.smokefree.gov or call 1-800 QUIT-NOW.  Be physically active- Exercise 5 days a week for a minimum of 30 minutes.  If you are not already physically active, start slow and gradually work up to 30 minutes of moderate physical activity.  Try walking, dancing, bike riding, swimming, etc.  Eat a healthy diet- Eat a variety of healthy foods such as fruits, vegetables, whole grains, low fat milk, low fat cheeses, yogurt, lean meats, chicken, fish, eggs, dried beans, tofu, etc.  For more information go to www.thenutritionsource.org  Dental visit- Brush and floss teeth twice daily; visit your dentist twice a year.  Eye exam- Visit your Optometrist or Ophthalmologist yearly.  Drink alcohol in moderation- Limit alcohol intake to one drink or less a day.  Never drink and drive.  Depression- Your emotional health is as important as your physical health.  If you're feeling down or losing interest in things you normally enjoy, please talk to your healthcare provider.  Seat Belts- can save your life; always wear one  Smoke/Carbon Monoxide detectors- These detectors need to be installed on the appropriate level of your  home.  Replace batteries at least once a year.  Violence- If anyone is threatening or hurting you, please tell your healthcare provider.  Living Will/ Health care power of attorney- Discuss with your healthcare provider and family.    Obesity Obesity is defined as having too much total body fat and a body mass index (BMI) of 30 or more. BMI is an estimate of body fat and is calculated from  your height and weight. Obesity happens when you consume more calories than you can burn by exercising or performing daily physical tasks. Prolonged obesity can cause major illnesses or emergencies, such as:   A stroke.  Heart disease.  Diabetes.  Cancer.  Arthritis.  High blood pressure (hypertension).  High cholesterol.  Sleep apnea.  Erectile dysfunction.  Infertility problems. CAUSES   Regularly eating unhealthy foods.  Physical inactivity.  Certain disorders, such as an underactive thyroid (hypothyroidism), Cushing's syndrome, and polycystic ovarian syndrome.  Certain medicines, such as steroids, some depression medicines, and antipsychotics.  Genetics.  Lack of sleep. DIAGNOSIS  A caregiver can diagnose obesity after calculating your BMI. Obesity will be diagnosed if your BMI is 30 or higher.  There are other methods of measuring obesity levels. Some other methods include measuring your skin fold thickness, your waist circumference, and comparing your hip circumference to your waist circumference. TREATMENT  A healthy treatment program includes some or all of the following:  Long-term dietary changes.  Exercise and physical activity.  Behavioral and lifestyle changes.  Medicine only under the supervision of your caregiver. Medicines may help, but only if they are used with diet and exercise programs. An unhealthy treatment program includes:  Fasting.  Fad diets.  Supplements and drugs. These choices do not succeed in long-term weight control.  HOME CARE INSTRUCTIONS   Exercise and perform physical activity as directed by your caregiver. To increase physical activity, try the following:  Use stairs instead of elevators.  Park farther away from store entrances.  Garden, bike, or walk instead of watching television or using the computer.  Eat healthy, low-calorie foods and drinks on a regular basis. Eat more fruits and vegetables. Use low-calorie  cookbooks or take healthy cooking classes.  Limit fast food, sweets, and processed snack foods.  Eat smaller portions.  Keep a daily journal of everything you eat. There are many free websites to help you with this. It may be helpful to measure your foods so you can determine if you are eating the correct portion sizes.  Avoid drinking alcohol. Drink more water and drinks without calories.  Take vitamins and supplements only as recommended by your caregiver.  Weight-loss support groups, Optometrist, counselors, and stress reduction education can also be very helpful. SEEK IMMEDIATE MEDICAL CARE IF:  You have chest pain or tightness.  You have trouble breathing or feel short of breath.  You have weakness or leg numbness.  You feel confused or have trouble talking.  You have sudden changes in your vision. MAKE SURE YOU:  Understand these instructions.  Will watch your condition.  Will get help right away if you are not doing well or get worse. Document Released: 09/01/2004 Document Revised: 01/24/2012 Document Reviewed: 08/31/2011 [facility] Patient Information 2013 [place], [place].  If you are interested in nutritional counseling, let us know and a referral can be made for you.

## 2012-09-18 NOTE — Progress Notes (Signed)
Subjective:    Patient ID: Jill [name], female    DOB: May 06, 1961, 52 y.o.   MRN: [id number]  HPI  This 52 y.o. AA female is here for CPE; last PAP was performed by GYN (Dr. Rondel Baton). MMG   is scheduled for today. Pt has chronic hearing loss which is followed by ENT- Dr. Ermalinda Barrios.  She also has chronic dry skin and has been eval by DERM- Dr. Donzetta Starch.  Otherwise, she has no significant complaints.     Review of Systems  Constitutional: Negative.   HENT: Positive for hearing loss.   Eyes: Positive for itching.  Genitourinary: Positive for frequency.  Psychiatric/Behavioral: Positive for sleep disturbance.  All other systems reviewed and are negative.       Objective:   Physical Exam  Nursing note and vitals reviewed. Constitutional: She is oriented to person, place, and time. She appears well-developed and well-nourished. No distress.  Pt is obese.  HENT:  Head: Normocephalic and atraumatic.  Right Ear: Tympanic membrane, external ear and ear canal normal. Decreased hearing is noted.  Left Ear: Tympanic membrane, external ear and ear canal normal. Decreased hearing is noted.  Nose: Nose normal. No nasal deformity or septal deviation.  Mouth/Throat: Uvula is midline and oropharynx is clear and moist.  Eyes: Conjunctivae, EOM and lids are normal. Pupils are equal, round, and reactive to light. No scleral icterus.  Neck: Normal range of motion. Neck supple. No thyromegaly present.  Cardiovascular: Normal rate, regular rhythm, normal heart sounds and intact distal pulses.  Exam reveals no gallop and no friction rub.   No murmur heard. Pulmonary/Chest: Effort normal and breath sounds normal. No respiratory distress. She has no wheezes. Right breast exhibits no inverted nipple, no mass, no nipple discharge, no skin change and no tenderness. Left breast exhibits no inverted nipple, no mass, no nipple discharge, no skin change and no tenderness. Breasts are symmetrical.  Pt  has large breasts w/ dense tissue.  Abdominal: Soft. She exhibits no distension, no abdominal bruit, no pulsatile midline mass and no mass. Bowel sounds are decreased. There is no hepatosplenomegaly. There is no tenderness. There is no guarding and no CVA tenderness. No hernia.  Genitourinary: Guaiac negative stool.  NEFG; PAP/pelvic exam not done.  Musculoskeletal: Normal range of motion. She exhibits no edema and no tenderness.  Lymphadenopathy:    She has no cervical adenopathy.  Neurological: She is alert and oriented to person, place, and time. She has normal reflexes. No cranial nerve deficit. She exhibits normal muscle tone. Coordination normal.  Skin: Skin is warm and dry. Rash noted.  Skin is very dry with hyperpigmented plaques esp on trunk.  Psychiatric: She has a normal mood and affect. Her behavior is normal. Judgment and thought content normal.          Assessment & Plan:   1. Routine general medical examination at a health care facility  Comprehensive metabolic panel      Lipid panel   CBC with Differential   IFOBT POC (occult bld, rslt in office)   POCT urinalysis dipstick   Tdap vaccine greater than or equal to 7yo IM  2. Screening for other and unspecified endocrine, nutritional, metabolic, and immunity disorders  Hepatitis C antibody   Hepatitis C antibody   T3, free   TSH   Vitamin D 25 hydroxy  3. Dry skin dermatitis  Pt can contact DERM and sch follow-up.  4. Obesity, Class III, BMI 40-49.9 (morbid obesity)  Advised better nutrition and increased physical activity.   T3, free   TSH   Lipid panel   Vitamin D 25 hydroxy   Pt advised that we may consider Sleep Study if sleep disturbance continues; she understands but declines at this time.

## 2012-09-19 ENCOUNTER — Other Ambulatory Visit: Payer: Self-pay | Admitting: Family Medicine

## 2012-09-19 MED ORDER — ERGOCALCIFEROL 1.25 MG (50000 UT) PO CAPS: 50000.0000 [IU] | ORAL_CAPSULE | ORAL | Status: DC

## 2012-09-19 NOTE — Progress Notes (Signed)
Quick Note:  Please call pt and advise that the following labs are abnormal... Blood sugar was above normal. You need to return to clinic to be screened for Diabetes.  You have very mild anemia; eat as healthy as possible including foods that are good sources of iron. Thyroid tests are normal. Test for Hepatitis C is negative.  Vitamin D is very low; I am prescribing a Vitamin D supplement - 50000 units per capsule- to be taken once a week.  Copy to pt. ______

## 2012-10-03 ENCOUNTER — Ambulatory Visit (INDEPENDENT_AMBULATORY_CARE_PROVIDER_SITE_OTHER): Payer: 59 | Admitting: Family Medicine

## 2012-10-03 VITALS — BP 130/85 | HR 94 | Temp 97.9°F | Resp 16 | Ht 60.0 in | Wt 272.0 lb

## 2012-10-03 DIAGNOSIS — IMO0001 Reserved for inherently not codable concepts without codable children: Secondary | ICD-10-CM

## 2012-10-03 DIAGNOSIS — R739 Hyperglycemia, unspecified: Secondary | ICD-10-CM

## 2012-10-03 DIAGNOSIS — R7309 Other abnormal glucose: Secondary | ICD-10-CM

## 2012-10-03 DIAGNOSIS — Z23 Encounter for immunization: Secondary | ICD-10-CM

## 2012-10-03 LAB — POCT GLYCOSYLATED HEMOGLOBIN (HGB A1C): Hemoglobin A1C: 8.9

## 2012-10-03 MED ORDER — GLIPIZIDE 5 MG PO TABS: 5.0000 mg | ORAL_TABLET | Freq: Two times a day (BID) | ORAL | Status: DC

## 2012-10-03 MED ORDER — LISINOPRIL 5 MG PO TABS: 5.0000 mg | ORAL_TABLET | Freq: Every day | ORAL | Status: DC

## 2012-10-03 NOTE — Patient Instructions (Addendum)
We are going to start you on glipizide for your blood sugar and lisinopril for blood pressure/ to protect your kidneys.    Start taking glipizide just once a day. If your fasting blood sugars continue to run over 150 increase to twice a day  Think about joining weight watchers or doing another weight loss program, and exercise daily if possible.  Weight loss (even just 20 or 30 lbs) will make a big difference and may eventually allow you to control your diabetes without medication  Please come and see [place] in about 2 months for a recheck- sooner if any concerns

## 2012-10-03 NOTE — Progress Notes (Signed)
[facility] [address] [place] [address] [phone]  Date:  10/03/2012   Name:  Jill [name]   DOB:  November 17, 1960   MRN:  [id number]  PCP:  No primary provider on file.    Chief Complaint: Hyperglycemia   History of Present Illness:  Jill [name] is a 52 y.o. very pleasant female patient who presents with the following:  She is here today to follow- up possible DM.  She was fasting at her last lab check and was noted to have a glucoce of 219.  She does have a glucometer- her sugar was 159 this am.  She has been given metformin in the past- she last used it a few years ago. She was diagnosed with "pre diabetes" at that time.  She did not tolerate the metfomin well and was bothered by persistent GI symptoms.  DM does run in her family    She had TM tubes placed this morning- she thinks this is why her BP is high.  Better on rechek  Patient Active Problem List  Diagnosis  . DIABETES MELLITUS, TYPE II, UNCONTROLLED  . OBESITY, MORBID  . OBSTRUCTIVE SLEEP APNEA  . ASTHMA  . GERD  . MIGRAINE, MENSTRUAL  . SYMPTOM, DISTURBANCE, SLEEP NOS  . FATIGUE  . DYSPNEA ON EXERTION  . MOTION SICKNESS    No past medical history on file.  Past Surgical History  Procedure Laterality Date  . Cesarean section    . Tympanostomy tube placement      History  Substance Use Topics  . Smoking status: Never Smoker   . Smokeless tobacco: Not on file  . Alcohol Use: No    Family History  Problem Relation Age of Onset  . Diabetes Maternal Grandmother   . Heart failure Maternal Grandmother   . Hypertension Maternal Grandfather   . Hypertension Mother   . Arthritis Mother     No Known Allergies  Medication list has been reviewed and updated.  Current Outpatient Prescriptions on File Prior to Visit  Medication Sig Dispense Refill  . ergocalciferol (DRISDOL) 50000 UNITS capsule Take 1 capsule (50,000 Units total) by mouth once a week.  4 capsule  5    No current facility-administered medications on file prior to visit.    Review of Systems:  As per HPI- otherwise negative.   Physical Examination: Filed Vitals:   10/03/12 1050  BP: 196/66  Pulse: 94  Temp: 97.9 F (36.6 C)  Resp: 16   Filed Vitals:   10/03/12 1050  Height: 5' (1.524 m)  Weight: 272 lb (123.378 kg)  see recheck BP  Body mass index is 53.12 kg/(m^2). Ideal Body Weight: Weight in (lb) to have BMI = 25: 127.7  GEN: WDWN, NAD, Non-toxic, A & O x 3, obese HEENT: Atraumatic, Normocephalic. Neck supple. No masses, No LAD. Ears and Nose: No external deformity. CV: RRR, No M/G/R. No JVD. No thrill. No extra heart sounds. PULM: CTA B, no wheezes, crackles, rhonchi. No retractions. No resp. distress. No accessory muscle use. ABD: S, NT, ND EXTR: No c/c/e NEURO Normal gait.  PSYCH: Normally interactive. Conversant. Not depressed or anxious appearing.  Calm demeanor.   Results for orders placed in visit on 10/03/12  POCT GLYCOSYLATED HEMOGLOBIN (HGB A1C)      Result Value Range   Hemoglobin A1C 8.9     She is not currently SA- not in the last 10 years    Assessment and Plan:  Hyperglycemia - Plan: POCT glycosylated hemoglobin (Hb A1C)  Type II or unspecified type diabetes mellitus without mention of complication, uncontrolled - Plan: glipiZIDE (GLUCOTROL) 5 MG tablet, lisinopril (PRINIVIL,ZESTRIL) 5 MG tablet, Flu vaccine greater than or equal to 3yo preservative free IM  Discussed dx of DM in detail and encouraged lifestyle changes to get her weight and blood sugars under control.  Will start treatment with glipizide as she had trouble tolerating glucophage in the past, also start a low dose of lisinopril.  Let her know that we will likely start a low dose of cholesterol medication at our next visit.    See patient instructions for more details.     Abbe Amsterdam, MD

## 2012-12-20 ENCOUNTER — Ambulatory Visit (INDEPENDENT_AMBULATORY_CARE_PROVIDER_SITE_OTHER): Payer: 59 | Admitting: Family Medicine

## 2012-12-20 ENCOUNTER — Encounter: Payer: Self-pay | Admitting: Family Medicine

## 2012-12-20 VITALS — BP 134/76 | HR 87 | Temp 98.2°F | Resp 16 | Ht 62.75 in | Wt 260.2 lb

## 2012-12-20 DIAGNOSIS — E11649 Type 2 diabetes mellitus with hypoglycemia without coma: Secondary | ICD-10-CM

## 2012-12-20 DIAGNOSIS — E1169 Type 2 diabetes mellitus with other specified complication: Secondary | ICD-10-CM

## 2012-12-20 DIAGNOSIS — IMO0001 Reserved for inherently not codable concepts without codable children: Secondary | ICD-10-CM

## 2012-12-20 MED ORDER — LISINOPRIL 5 MG PO TABS: 5.0000 mg | ORAL_TABLET | Freq: Every day | ORAL | Status: DC

## 2012-12-20 MED ORDER — GLIPIZIDE 5 MG PO TABS: ORAL_TABLET | ORAL | Status: DC

## 2012-12-20 NOTE — Progress Notes (Signed)
S:  This 52 y.o. AA female is here for Type II DM f/u; she had A1c checked in Feb 2014= 8.9%. Medications were prescribed and she has been compliant with meds as well as nutrition modification. Pt has eliminated most carbs and meat and drinks mostly water. She has had a few episodes of hypoglycemia (symptomatic w/ diaphoresis and shakiness). This occurred when she increased Glipizide dose to twice daily; she took that  Dose for about 1 month then reduced it back to once a day after FSBS= 60. She feels better at this dose; she has had a mild dry cough which may be related to Lisinopril or allergic rhinitis. Pt  Works 12-hour days so she has not been very motivated to exercise.  PMHx, Soc Hx and Fam Hx reviewed- pt's grandmother died w/ complications (blindness and PAD/ amputation).  O:  Filed Vitals:   12/20/12 0930  BP: 134/76                                        Weight down 12 pounds !  Pulse: 87  Temp: 98.2 F (36.8 C)  Resp: 16   GEN: In nAD; WN,WD. Pt  Is obese. HEENT: [place]/AT. EOMI w/ clear conj/ sclerae. EACs/nose/orph clear and moist. Otherwise unremarkable. COR: RRR. LUNGS: Normal resp rate and effort. SKIN: W&D; no rashes or erythema. NEURO: A&O x 3; CNs intact. Nonfocal.  Results for orders placed in visit on 12/20/12  POCT GLYCOSYLATED HEMOGLOBIN (HGB A1C)      Result Value Range   Hemoglobin A1C 6.6      A/P: Type II or unspecified type diabetes mellitus without mention of complication, uncontrolled - Plan:  Continue Glipizide once a day before breakfast; eat regular meals and healthy snacks.  Microalbumin, urine pending  Hypoglycemia associated with diabetes- Monitor FSBS and discontinue Glipizide if hypoglycemia continues to occur. Maintain DM control w/ good nutrition.  Severe obesity (BMI >= 40)- begin exercise program as instructed.  Meds ordered this encounter  Medications  . Multiple Vitamin (MULTIVITAMIN) tablet    Sig: Take 1 tablet by mouth daily.  Marland Kitchen OVER  THE COUNTER MEDICATION    Sig: OTC Hair,Skin, and Nail  B Complex taking  twice daily  . glipiZIDE (GLUCOTROL) 5 MG tablet    Sig: Take 1 tablet by mouth twice a day before meals or as directed.    Dispense:  60 tablet    Refill:  5  . lisinopril (PRINIVIL,ZESTRIL) 5 MG tablet    Sig: Take 1 tablet (5 mg total) by mouth daily.    Dispense:  30 tablet    Refill:  5

## 2012-12-20 NOTE — Patient Instructions (Addendum)
You have done well with weight loss. Continue the medications once a day and monitor your blood sugar as you have been doing. Try to start exercising for 10-15 minutes most days of the week for additional weight loss and cardiovascular health. I will see you again in 3-4 months.

## 2013-01-11 ENCOUNTER — Telehealth: Payer: Self-pay

## 2013-01-11 MED ORDER — OLOPATADINE HCL 0.2 % OP SOLN: OPHTHALMIC | Status: DC

## 2013-01-11 MED ORDER — FEXOFENADINE HCL 180 MG PO TABS: 180.0000 mg | ORAL_TABLET | Freq: Every day | ORAL | Status: DC

## 2013-01-11 MED ORDER — LOSARTAN POTASSIUM 25 MG PO TABS: 25.0000 mg | ORAL_TABLET | Freq: Every day | ORAL | Status: DC

## 2013-01-11 NOTE — Telephone Encounter (Signed)
Pt would like to change her b/p medicine because the one she is currently on is causing her to cough, also the zyrtec is not working for  Her.please advise. [facility] [address] Best# [phone]

## 2013-01-11 NOTE — Telephone Encounter (Signed)
I have routed the following medications to pt's pharmacy: Generic Allegra 180 mg 1 tablet daily for allergies; allergy eye drops to be used once a day (1 drop in each eye) and Losartan for blood pressure in place of Lisinopril. If pt continues to have problems, she should schedule a follow-up visit.

## 2013-01-11 NOTE — Telephone Encounter (Signed)
1.Using the zyrtec she is still having issues with her eyes. Her eyes are matted in the morning with ithcing. Had fish last night for the fist time in 1 month and started itching bad. Unsure if the fish caused a sensitivity? 2.Has had dry, hacking cough x 1 month. Sometimes can not catch her breath with the cough. Has not taken Lisinopril x 2 days and the cough has resolved. Can we switch her bp medication? Please advise. Thank you!

## 2013-01-14 NOTE — Telephone Encounter (Signed)
Patient called back and was advised.  

## 2013-01-14 NOTE — Telephone Encounter (Signed)
Called her to advise, left message for her to call me back 

## 2013-04-25 ENCOUNTER — Ambulatory Visit: Payer: 59 | Admitting: Family Medicine

## 2013-06-18 ENCOUNTER — Ambulatory Visit: Payer: 59 | Admitting: Family Medicine

## 2013-08-17 ENCOUNTER — Other Ambulatory Visit: Payer: Self-pay | Admitting: Family Medicine

## 2013-08-30 ENCOUNTER — Other Ambulatory Visit: Payer: Self-pay

## 2013-08-30 DIAGNOSIS — Z1231 Encounter for screening mammogram for malignant neoplasm of breast: Secondary | ICD-10-CM

## 2013-10-22 ENCOUNTER — Encounter: Payer: Self-pay | Admitting: Family Medicine

## 2013-10-22 ENCOUNTER — Encounter: Payer: Self-pay | Admitting: Certified Nurse Midwife

## 2013-10-22 ENCOUNTER — Ambulatory Visit
Admission: RE | Admit: 2013-10-22 | Discharge: 2013-10-22 | Disposition: A | Discharge status: Home / Self Care | Payer: 59 | Source: Ambulatory Visit

## 2013-10-22 ENCOUNTER — Other Ambulatory Visit: Payer: Self-pay | Admitting: Family Medicine

## 2013-10-22 ENCOUNTER — Ambulatory Visit (INDEPENDENT_AMBULATORY_CARE_PROVIDER_SITE_OTHER): Payer: 59 | Admitting: Certified Nurse Midwife

## 2013-10-22 ENCOUNTER — Ambulatory Visit (INDEPENDENT_AMBULATORY_CARE_PROVIDER_SITE_OTHER): Payer: 59 | Admitting: Family Medicine

## 2013-10-22 VITALS — BP 120/82 | HR 78 | Resp 16 | Ht 62.75 in | Wt 274.0 lb

## 2013-10-22 VITALS — BP 130/72 | HR 107 | Temp 98.8°F | Resp 16 | Ht 62.75 in | Wt 275.2 lb

## 2013-10-22 DIAGNOSIS — E010 Iodine-deficiency related diffuse (endemic) goiter: Secondary | ICD-10-CM

## 2013-10-22 DIAGNOSIS — E559 Vitamin D deficiency, unspecified: Secondary | ICD-10-CM

## 2013-10-22 DIAGNOSIS — I10 Essential (primary) hypertension: Secondary | ICD-10-CM

## 2013-10-22 DIAGNOSIS — R928 Other abnormal and inconclusive findings on diagnostic imaging of breast: Secondary | ICD-10-CM

## 2013-10-22 DIAGNOSIS — E049 Nontoxic goiter, unspecified: Secondary | ICD-10-CM

## 2013-10-22 DIAGNOSIS — E1165 Type 2 diabetes mellitus with hyperglycemia: Secondary | ICD-10-CM

## 2013-10-22 DIAGNOSIS — Z0000 Encounter for general adult medical examination without abnormal findings: Secondary | ICD-10-CM

## 2013-10-22 DIAGNOSIS — N951 Menopausal and female climacteric states: Secondary | ICD-10-CM

## 2013-10-22 DIAGNOSIS — Z01419 Encounter for gynecological examination (general) (routine) without abnormal findings: Secondary | ICD-10-CM

## 2013-10-22 DIAGNOSIS — Z1231 Encounter for screening mammogram for malignant neoplasm of breast: Secondary | ICD-10-CM

## 2013-10-22 DIAGNOSIS — IMO0001 Reserved for inherently not codable concepts without codable children: Secondary | ICD-10-CM

## 2013-10-22 LAB — POCT GLYCOSYLATED HEMOGLOBIN (HGB A1C): HEMOGLOBIN A1C: 9.3

## 2013-10-22 LAB — COMPLETE METABOLIC PANEL WITH GFR
ALT: 29 U/L (ref 0–35)
AST: 22 U/L (ref 0–37)
Albumin: 3.9 g/dL (ref 3.5–5.2)
Alkaline Phosphatase: 86 U/L (ref 39–117)
BUN: 11 mg/dL (ref 6–23)
CALCIUM: 9.5 mg/dL (ref 8.4–10.5)
CHLORIDE: 101 meq/L (ref 96–112)
CO2: 29 mEq/L (ref 19–32)
CREATININE: 0.93 mg/dL (ref 0.50–1.10)
GFR, EST AFRICAN AMERICAN: 82 mL/min
GFR, Est Non African American: 71 mL/min
Glucose, Bld: 240 mg/dL — ABNORMAL HIGH (ref 70–99)
Potassium: 4.3 mEq/L (ref 3.5–5.3)
Sodium: 136 mEq/L (ref 135–145)
Total Bilirubin: 0.5 mg/dL (ref 0.2–1.2)
Total Protein: 7.3 g/dL (ref 6.0–8.3)

## 2013-10-22 LAB — HM DIABETES EYE EXAM

## 2013-10-22 LAB — GLUCOSE, POCT (MANUAL RESULT ENTRY): POC GLUCOSE: 258 mg/dL — AB (ref 70–99)

## 2013-10-22 MED ORDER — GLIPIZIDE 5 MG PO TABS: 5.0000 mg | ORAL_TABLET | Freq: Two times a day (BID) | ORAL | Status: DC

## 2013-10-22 MED ORDER — LOSARTAN POTASSIUM 25 MG PO TABS: 25.0000 mg | ORAL_TABLET | Freq: Every day | ORAL | Status: DC

## 2013-10-22 MED ORDER — GLIPIZIDE 10 MG PO TABS: 10.0000 mg | ORAL_TABLET | Freq: Two times a day (BID) | ORAL | Status: DC

## 2013-10-22 NOTE — Progress Notes (Signed)
53 y.o. G1P1001 Single African American Fe here for annual exam. Periods are scant to small now, last normal heavy  period 11/14. Occasional hot flash or night sweat, unsure if related to cycle or diabetes. Contraception not needed not sexually active. Sees PCP for diabetes management, aex, labs. Has appointment today. Patient off diabetic medication at present. Plans to work with PCP regarding at appointment today. Sees dermatology for skin issues on legs, appointment today. No other health issues today.   Patient's last menstrual period was 10/16/2013.          Sexually active: no  The current method of family planning is abstinence.    Exercising: no  exercise Smoker:  no  Health Maintenance: Pap:  2011, no abnormal paps ever MMG:  12/13, scheduled for today Colonoscopy:  none BMD:   none TDaP:  2014 Labs: none Self breast exam: done monthly   reports that she has never smoked. She does not have any smokeless tobacco history on file. She reports that she does not drink alcohol or use illicit drugs.  Past Medical History  Diagnosis Date  . Allergy   . Diabetes mellitus without complication   . Asthma     67yrs ago  . Migraines     none in last 11 yrs  . Anemia   . Post partum depression   . Dysmenorrhea   . Urinary incontinence     Past Surgical History  Procedure Laterality Date  . Cesarean section    . Tympanostomy tube placement      Current Outpatient Prescriptions  Medication Sig Dispense Refill  . fexofenadine (ALLEGRA) 180 MG tablet Take 1 tablet (180 mg total) by mouth daily.  30 tablet  5  . Multiple Vitamin (MULTIVITAMIN) tablet Take 1 tablet by mouth daily.      Marland Kitchen OVER THE COUNTER MEDICATION OTC Hair,Skin, and Nail  B Complex taking  twice daily       No current facility-administered medications for this visit.    Family History  Problem Relation Age of Onset  . Diabetes Maternal Grandmother   . Heart failure Maternal Grandmother   . Hypertension  Maternal Grandfather   . Hypertension Mother   . Arthritis Mother   . Cancer Sister     cervical  . Other Sister     blood clot    ROS:  Pertinent items are noted in HPI.  Otherwise, a comprehensive ROS was negative.  Exam:   BP 120/82  Pulse 78  Resp 16  Ht 5' 2.75" (1.594 m)  Wt 274 lb (124.286 kg)  BMI 48.92 kg/m2  LMP 10/16/2013 Height: 5' 2.75" (159.4 cm)  Ht Readings from Last 3 Encounters:  10/22/13 5' 2.75" (1.594 m)  12/20/12 5' 2.75" (1.594 m)  10/03/12 5' (1.524 m)    General appearance: alert, cooperative and appears stated age Head: Normocephalic, without obvious abnormality, atraumatic Neck: no adenopathy, supple, symmetrical, trachea midline and thyroid normal to inspection and palpation Lungs: clear to auscultation bilaterally Breasts: normal appearance, no masses or tenderness, No nipple retraction or dimpling, No nipple discharge or bleeding, No axillary or supraclavicular adenopathy Heart: regular rate and rhythm Abdomen: soft, non-tender; no masses,  no organomegaly Extremities: extremities normal, atraumatic, no cyanosis or edema Skin: Skin color, texture, turgor normal. No rashes or lesions Lymph nodes: Cervical, supraclavicular, and axillary nodes normal. No abnormal inguinal nodes palpated Neurologic: Grossly normal   Pelvic: External genitalia:  no lesions  Urethra:  normal appearing urethra with no masses, tenderness or lesions              Bartholin's and Skene's: normal                 Vagina: normal appearing vagina with normal color and discharge, no lesions              Cervix: normal, non  tender              Pap taken: yes Bimanual Exam:  Uterus:  ?normal size, due to body habitus, no large mass palpated              Adnexa: adnexa not palpated due to body habitus, no large masses noted               Rectovaginal: Confirms               Anus:  normal sphincter tone, no lesions  A:  Well Woman with normal  exam  Perimenopausal with cycle change  Contraception none needed not sexually active  Limited pelvic exam due to body habitus  Morbid Obesity  Diabetes type 2 no medication at present   P:   Reviewed health and wellness pertinent to exam  Discussed etiology and bleeding expectations. Given menses calendar to record with normal and abnormal parameters. Patient will advise if occurs or no period in 3 months.  Lab:FSH  Discussed limited pelvic exam and concern for other findings that could be present. Recommend PUS for evaluation, patient agreeable to PUS. Discussed with patient that she will be called by our insurance personnel with information regarding coverage of PUS. She will then be called by Triage RN to schedule appointment. Patient voiced understanding.  Discussed risks associated Morbid Obesity and importance of losing small amounts of weight are beneficial.Patient aware of surgical options, but not interested. She will be working with PCP again regarding diet and exercise.  Keep appointment today with PCP to follow up.  Pap smear as per guidelines   Mammogram yearly pap smear taken today with [facility]  counseled on breast self exam, mammography screening, adequate intake of calcium and vitamin D, diet and exercise, risks and benefits of colonoscopy. Declines scheduling today or IFOB, will do with PCP.  return annually or prn  An After Visit Summary was printed and given to the patient.

## 2013-10-22 NOTE — Progress Notes (Signed)
Subjective:    Patient ID: Jill [name], female    DOB: 09/14/1960, 53 y.o.   MRN: [id number]  HPI  This 53 y.o. AA female is here for CPE. PAP this morning at GYN practice. Has vision exam scheduled today at 5 pm. Pt has not taken medications x 7 months. Her daughter is enrolled at  [facility] and pt was facing foreclosure, so she discontinued all medications to save money. 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  Patient Active Problem List   Diagnosis Date Noted  . OBESITY, MORBID 07/30/2009  . DYSPNEA ON EXERTION 07/30/2009  . DIABETES MELLITUS, TYPE II, UNCONTROLLED 05/19/2009  . OBSTRUCTIVE SLEEP APNEA 05/19/2009  . FATIGUE 05/19/2009  . SYMPTOM, DISTURBANCE, SLEEP NOS 06/01/2007  . MOTION SICKNESS 03/12/2007  . ASTHMA 01/31/2007  . GERD 01/31/2007  . MIGRAINE, MENSTRUAL 01/31/2007    Prior to Admission medications   Medication Sig Start Date End Date Taking? Authorizing Provider  fexofenadine (ALLEGRA) 180 MG tablet Take 1 tablet (180 mg total) by mouth daily. 01/11/13  Yes Barton Fanny, MD  Multiple Vitamin (MULTIVITAMIN) tablet Take 1 tablet by mouth daily.   Yes Historical Provider, MD  OVER THE COUNTER MEDICATION OTC Hair,Skin, and Nail  B Complex taking  twice daily   Yes Historical Provider, MD   PMHx, Surg Hx, Soc and Fam Hx reviewed.   Review of Systems  Constitutional: Negative.   HENT: Positive for hearing loss.        Hearing loss is chronic.  Eyes: Positive for itching.  Respiratory: Positive for cough.   Cardiovascular: Negative.   Gastrointestinal: Negative.   Endocrine: Negative.   Genitourinary: Positive for frequency and menstrual problem.  Musculoskeletal: Positive for arthralgias, myalgias and neck pain.  Skin: Negative.   Allergic/Immunologic: Positive for environmental allergies.  Neurological: Positive for numbness.  Hematological: Negative.     Psychiatric/Behavioral: Negative.        Objective:   Physical Exam  Nursing note and vitals reviewed. Constitutional: She is oriented to person, place, and time. Vital signs are normal. She appears well-developed and well-nourished. No distress.  HENT:  Head: Normocephalic and atraumatic.  Right Ear: Hearing, external ear and ear canal normal.  Left Ear: Hearing, external ear and ear canal normal.  Nose: Nose normal. No nasal deformity or septal deviation.  Mouth/Throat: Uvula is midline, oropharynx is clear and moist and mucous membranes are normal. No oral lesions. Normal dentition. No dental caries.  Eyes: Conjunctivae, EOM and lids are normal. Pupils are equal, round, and reactive to light. No scleral icterus.  Fundoscopic exam:      The right eye shows red reflex.       The left eye shows red reflex.  Neck: Trachea normal, normal range of motion and full passive range of motion without pain. Neck supple. No JVD present. No spinous process tenderness and no muscular tenderness present. Carotid bruit is not present. Thyromegaly present. No mass present.  Cardiovascular: Normal rate, regular rhythm, S1 normal, S2 normal and normal heart sounds.   No extrasystoles are present. Exam reveals decreased pulses. Exam reveals no gallop and no friction rub.   No murmur heard. Pulmonary/Chest: Effort normal and breath sounds normal. No respiratory distress.  Abdominal: Soft. Normal appearance. She exhibits no distension, no pulsatile midline mass and no mass. Bowel sounds are decreased. There is no hepatosplenomegaly. There is no tenderness. There is no guarding and  no CVA tenderness. No hernia.  Abdominal girth increased; difficult to assess for intra-abdominal masses or abnormalities.  Genitourinary:  Deferred.  Musculoskeletal: Normal range of motion. She exhibits no edema and no tenderness.       Cervical back: Normal.       Thoracic back: She exhibits no bony tenderness, no swelling, no  deformity, no pain and no spasm.       Lumbar back: She exhibits no bony tenderness, no deformity, no pain and no spasm.  Lymphadenopathy:       Head (right side): No submental, no submandibular, no tonsillar, no posterior auricular and no occipital adenopathy present.       Head (left side): No submental, no submandibular, no tonsillar, no posterior auricular and no occipital adenopathy present.    She has no cervical adenopathy.       Right: No inguinal and no supraclavicular adenopathy present.       Left: No inguinal and no supraclavicular adenopathy present.  Neurological: She is alert and oriented to person, place, and time. She has normal strength. She displays no atrophy and no tremor. No cranial nerve deficit or sensory deficit. She exhibits normal muscle tone. Coordination and gait normal.  DTRs decreased and difficult to assess.  Skin: Skin is warm and dry. She is not diaphoretic. No cyanosis. Nails show no clubbing.  Pt has very dry skin; she was seen by DERM this morning.  Psychiatric: She has a normal mood and affect. Her speech is normal and behavior is normal. Judgment and thought content normal. Cognition and memory are normal.    Results for orders placed in visit on 10/22/13  GLUCOSE, POCT (MANUAL RESULT ENTRY)      Result Value Ref Range   POC Glucose 258 (*) 70 - 99 mg/dl  POCT GLYCOSYLATED HEMOGLOBIN (HGB A1C)      Result Value Ref Range   Hemoglobin A1C 9.3      ECG: NSR; no ST-TW changes. No ectopy.     Assessment & Plan:  Routine general medical examination at a health care facility - Plan: POCT urinalysis dipstick, COMPLETE METABOLIC PANEL WITH GFR,  EKG 12-Lead  DIABETES MELLITUS, TYPE II, UNCONTROLLED - Resume med >> Glipizide 5 mg 1 tablet twice a day before main meals. Information given re: Nutrition and exercise. Plan: HM Diabetes Foot Exam, POCT glucose (manual entry), POCT glycosylated hemoglobin (Hb A1C), COMPLETE METABOLIC PANEL WITH  GFR  Unspecified vitamin D deficiency - Plan: Vitamin D 1,25 dihydroxy  Thyromegaly-Thyroid panel with TSH  OBESITY, MORBID - Plan: COMPLETE METABOLIC PANEL WITH GFR, Thyroid Panel With TSH, Vitamin D 1,25 dihydroxy, EKG 12-Lead  Meds ordered this encounter  Medications                 . glipiZIDE (GLUCOTROL) 5 MG tablet    Sig: Take 1 tablet (5 mg total) by mouth 2 (two) times daily before a meal.    Dispense:  60 tablet    Refill:  3  . losartan (COZAAR) 25 MG tablet    Sig: Take 1 tablet (25 mg total) by mouth daily.    Dispense:  30 tablet    Refill:  3

## 2013-10-22 NOTE — Patient Instructions (Signed)
EXERCISE AND DIET:  We recommended that you start or continue a regular exercise program for good health. Regular exercise means any activity that makes your heart beat faster and makes you sweat.  We recommend exercising at least 30 minutes per day at least 3 days a week, preferably 4 or 5.  We also recommend a diet low in fat and sugar.  Inactivity, poor dietary choices and obesity can cause diabetes, heart attack, stroke, and kidney damage, among others.    ALCOHOL AND SMOKING:  Women should limit their alcohol intake to no more than 7 drinks/beers/glasses of wine (combined, not each!) per week. Moderation of alcohol intake to this level decreases your risk of breast cancer and liver damage. And of course, no recreational drugs are part of a healthy lifestyle.  And absolutely no smoking or even second hand smoke. Most people know smoking can cause heart and lung diseases, but did you know it also contributes to weakening of your bones? Aging of your skin?  Yellowing of your teeth and nails?  CALCIUM AND VITAMIN D:  Adequate intake of calcium and Vitamin D are recommended.  The recommendations for exact amounts of these supplements seem to change often, but generally speaking 600 mg of calcium (either carbonate or citrate) and 800 units of Vitamin D per day seems prudent. Certain women may benefit from higher intake of Vitamin D.  If you are among these women, your doctor will have told you during your visit.    PAP SMEARS:  Pap smears, to check for cervical cancer or precancers,  have traditionally been done yearly, although recent scientific advances have shown that most women can have pap smears less often.  However, every woman still should have a physical exam from her gynecologist every year. It will include a breast check, inspection of the vulva and vagina to check for abnormal growths or skin changes, a visual exam of the cervix, and then an exam to evaluate the size and shape of the uterus and  ovaries.  And after 52 years of age, a rectal exam is indicated to check for rectal cancers. We will also provide age appropriate advice regarding health maintenance, like when you should have certain vaccines, screening for sexually transmitted diseases, bone density testing, colonoscopy, mammograms, etc.   MAMMOGRAMS:  All women over 40 years old should have a yearly mammogram. Many facilities now offer a "3D" mammogram, which may cost around $50 extra out of pocket. If possible,  we recommend you accept the option to have the 3D mammogram performed.  It both reduces the number of women who will be called back for extra views which then turn out to be normal, and it is better than the routine mammogram at detecting truly abnormal areas.    COLONOSCOPY:  Colonoscopy to screen for colon cancer is recommended for all women at age 50.  We know, you hate the idea of the prep.  We agree, BUT, having colon cancer and not knowing it is worse!!  Colon cancer so often starts as a polyp that can be seen and removed at colonscopy, which can quite literally save your life!  And if your first colonoscopy is normal and you have no family history of colon cancer, most women don't have to have it again for 10 years.  Once every ten years, you can do something that may end up saving your life, right?  We will be happy to help you get it scheduled when you are ready.    Be sure to check your insurance coverage so you understand how much it will cost.  It may be covered as a preventative service at no cost, but you should check your particular policy.     Calorie Counting Diet A calorie counting diet requires you to eat the number of calories that are right for you in a day. Calories are the measurement of how much energy you get from the food you eat. Eating the right amount of calories is important for staying at a healthy weight. If you eat too many calories, your body will store them as fat and you may gain weight. If you  eat too few calories, you may lose weight. Counting the number of calories you eat during a day will help you know if you are eating the right amount. A Registered Dietitian can determine how many calories you need in a day. The amount of calories needed varies from person to person. If your goal is to lose weight, you will need to eat fewer calories. Losing weight can benefit you if you are overweight or have health problems such as heart disease, high blood pressure, or diabetes. If your goal is to gain weight, you will need to eat more calories. Gaining weight may be necessary if you have a certain health problem that causes your body to need more energy. TIPS Whether you are increasing or decreasing the number of calories you eat during a day, it may be hard to get used to changes in what you eat and drink. The following are tips to help you keep track of the number of calories you eat.  Measure foods at home with measuring cups. This helps you know the amount of food and number of calories you are eating.  Restaurants often serve food in amounts that are larger than 1 serving. While eating out, estimate how many servings of a food you are given. For example, a serving of cooked rice is  cup or about the size of half of a fist. Knowing serving sizes will help you be aware of how much food you are eating at restaurants.  Ask for smaller portion sizes or child-size portions at restaurants.  Plan to eat half of a meal at a restaurant. Take the rest home or share the other half with a friend.  Read the Nutrition Facts panel on food labels for calorie content and serving size. You can find out how many servings are in a package, the size of a serving, and the number of calories each serving has.  For example, a package might contain 3 cookies. The Nutrition Facts panel on that package says that 1 serving is 1 cookie. Below that, it will say there are 3 servings in the container. The calories section  of the Nutrition Facts label says there are 90 calories. This means there are 90 calories in 1 cookie (1 serving). If you eat 1 cookie you have eaten 90 calories. If you eat all 3 cookies, you have eaten 270 calories (3 servings x 90 calories = 270 calories). The list below tells you how big or small some common portion sizes are.  1 oz.........4 stacked dice.  3 oz........Marland KitchenDeck of cards.  1 tsp.......Marland KitchenTip of little finger.  1 tbs......Marland KitchenMarland KitchenThumb.  2 tbs.......Marland KitchenGolf ball.   cup......Marland KitchenHalf of a fist.  1 cup.......Marland KitchenA fist. KEEP A FOOD LOG Write down every food item you eat, the amount you eat, and the number of calories in each food you eat during  the day. At the end of the day, you can add up the total number of calories you have eaten. It may help to keep a list like the one below. Find out the calorie information by reading the Nutrition Facts panel on food labels. Breakfast  Bran cereal (1 cup, 110 calories).  Fat-free milk ( cup, 45 calories). Snack  Apple (1 medium, 80 calories). Lunch  Spinach (1 cup, 20 calories).  Tomato ( medium, 20 calories).  Chicken breast strips (3 oz, 165 calories).  Shredded cheddar cheese ( cup, 110 calories).  Light [place] dressing (2 tbs, 60 calories).  Whole-wheat bread (1 slice, 80 calories).  Tub margarine (1 tsp, 35 calories).  Vegetable soup (1 cup, 160 calories). Dinner  Pork chop (3 oz, 190 calories).  [place] rice (1 cup, 215 calories).  Steamed broccoli ( cup, 20 calories).  Strawberries (1  cup, 65 calories).  Whipped cream (1 tbs, 50 calories). Daily Calorie Total: 2876 Document Released: 07/25/2005 Document Revised: 10/17/2011 Document Reviewed: 01/19/2007 [facility] Patient Information 2014 [place].   It was nice to see you today. We will be calling to schedule your PUS. Have a great spring! Debbi

## 2013-10-22 NOTE — Patient Instructions (Signed)
Diabetes and Exercise Exercising regularly is important. It is not just about losing weight. It has many health benefits, such as:  Improving your overall fitness, flexibility, and endurance.  Increasing your bone density.  Helping with weight control.  Decreasing your body fat.  Increasing your muscle strength.  Reducing stress and tension.  Improving your overall health. People with diabetes who exercise gain additional benefits because exercise:  Reduces appetite.  Improves the body's use of blood sugar (glucose).  Helps lower or control blood glucose.  Decreases blood pressure.  Helps control blood lipids (such as cholesterol and triglycerides).  Improves the body's use of the hormone insulin by:  Increasing the body's insulin sensitivity.  Reducing the body's insulin needs.  Decreases the risk for heart disease because exercising:  Lowers cholesterol and triglycerides levels.  Increases the levels of good cholesterol (such as high-density lipoproteins [HDL]) in the body.  Lowers blood glucose levels. YOUR ACTIVITY PLAN  Choose an activity that you enjoy and set realistic goals. Your health care provider or diabetes educator can help you make an activity plan that works for you. You can break activities into 2 or 3 sessions throughout the day. Doing so is as good as one long session. Exercise ideas include:  Taking the dog for a walk.  Taking the stairs instead of the elevator.  Dancing to your favorite song.  Doing your favorite exercise with a friend. RECOMMENDATIONS FOR EXERCISING WITH TYPE 1 OR TYPE 2 DIABETES   Check your blood glucose before exercising. If blood glucose levels are greater than 240 mg/dL, check for urine ketones. Do not exercise if ketones are present.  Avoid injecting insulin into areas of the body that are going to be exercised. For example, avoid injecting insulin into:  The arms when playing tennis.  The legs when  jogging.  Keep a record of:  Food intake before and after you exercise.  Expected peak times of insulin action.  Blood glucose levels before and after you exercise.  The type and amount of exercise you have done.  Review your records with your health care provider. Your health care provider will help you to develop guidelines for adjusting food intake and insulin amounts before and after exercising.  If you take insulin or oral hypoglycemic agents, watch for signs and symptoms of hypoglycemia. They include:  Dizziness.  Shaking.  Sweating.  Chills.  Confusion.  Drink plenty of water while you exercise to prevent dehydration or heat stroke. Body water is lost during exercise and must be replaced.  Talk to your health care provider before starting an exercise program to make sure it is safe for you. Remember, almost any type of activity is better than none. Document Released: 10/15/2003 Document Revised: 03/27/2013 Document Reviewed: 01/01/2013 [facility] Patient Information 2014 [place].   How to Increase Fiber in the Meal Plan for Diabetes Increasing fiber in the diet has many benefits including lowering blood cholesterol, helping to control blood glucose (sugar), preventing constipation, and aiding in weight management by helping you feel full longer. Start adding fiber to your diet slowly. A gradual substitution of high-fiber foods for low-fiber foods will allow the digestive tract to adjust. Most men under 73 years of age should aim to eat 38 g of fiber a day. Women should aim for 25 g. Over 33 years of age, most men need 30 g of fiber and most women need 21 g. Below are some suggestions for increasing fiber.  Try whole-wheat bread instead  of white bread. Look for words high on the list of ingredients, such as whole wheat, whole rye, or whole oats.  Try a baked potato with skin instead of mashed potatoes.  Try a fresh apple with skin instead of applesauce.  Try a  fresh orange instead of orange juice.  Try popcorn instead of potato chips.  Try bran cereal instead of corn flakes.  Try kidney, whole pinto, or garbanzo beans instead of bread.  Try whole-grain crackers instead of saltine crackers.  Try whole-wheat pasta instead of regular varieties. While on a high-fiber diet:   Drink enough water and fluids to keep your urine clear or pale yellow.  Eat a variety of high fiber foods such as fruits, vegetables, whole grains, nuts, and seeds.  Aim for 5 servings of fruit and vegetables per day.  Try to increase your intake of fiber by eating high-fiber foods instead of taking fiber supplements that contain small amounts of fiber. There can be additional benefits for long-term health and blood glucose control with high-fiber foods.  SOURCES OF FIBER The following list shows the average dietary fiber for types of food in the various food groups. Starches and Breads / Dietary Fiber (g)  Whole-grain bread, 1 slice / 2 g  Whole-grain cereals,  cup / 3 g  Bran cereals,  to  cup / 8 g  Starchy vegetables,  cup / 3 g  Oatmeal,  cup / 2 g  Whole-wheat pasta,  cup / 2 g  [place] rice,  cup / 2 g  Barley,  cup / 3 g Legumes / Dietary Fiber (g)  Beans,  cup / 8 g  Peas,  cup / 8 g  Lentils ,  cup / 8 g Meat and Meat Substitutes / Dietary Fiber (g) This group averages 0 grams of fiber. Exceptions are:  Nuts, seeds, 1 oz or  cup / 3 g  Chunky peanut butter, 2 tbs / 3 g Vegetables / Dietary Fiber (g)  Cooked vegetables,  to  cup / 2 to 3 g  Raw vegetables, 1 to 2 cups / 2 to 3 g Fruit / Dietary Fiber (g)  Raw or cooked fruit,  cup or 1 small, fresh piece / 2 g Milk / Dietary Fiber (g)  Milk, 1 cup or 8 oz / 0 g Fats and Oils / Dietary Fiber (g)  Fats and oils, 1 tsp / 0 g You can determine how much fiber you are eating by reading the Nutrition Facts panel on the labels of the foods you eat. FIBER IN SPECIFIC  FOODS Cereals / Dietary Fiber (g)  All Bran,  cup / 9 g  All Bran with Extra Fiber,  cup / 13 g  Bran Flakes,  cup / 4 g  Cheerios,  cup / 1.5 g  Corn Bran,  cup / 4 g  Corn Flakes,  cup / 0.75 g  Cracklin' Oat Bran,  cup / 4 g  Fiber One,  cup / 13 g  Grape Nuts, 3 tbs / 3 g  Grape Nuts Flakes,  cup / 3 g  Noodles,  cup, cooked / 0.5 g  Nutrigrain Wheat,  cup / 3.5 g  Oatmeal,  cup, cooked / 1.1 g  Pasta, white (macaroni, spaghetti),  cup, cooked / 0.5 g  Pasta, whole-wheat (macaroni, spaghetti),  cup, cooked / 2 g  [place],  cup, cooked / 3 g  Rice, wild,  cup, cooked / 0.5 g  Rice, [place],  cup, cooked / 1 g  Rice, white,  cup, cooked / 0.2 g  Shredded Wheat, bite-sized,  cup / 2 g  Total,  cup / 1.75 g  Wheat Chex,  cup / 2.5 g  [place],  cup, cooked / 4 g  Wheaties,  cup / 2.75 g Bread, Starchy Vegetables, and Dried Peas and Beans / Dietary Fiber (g)  Bagel, whole / 0.6 g  Baked beans in tomato sauce,  cup, cooked / 3 g  Bran muffin, 1 small / 2.5 g  Bread, cracked wheat, 1 slice / 2.5 g  Bread, pumpernickel, 1 slice / 2.5 g  Bread, white, 1 slice / 0.4 g  Bread, whole-wheat, 1 slice / 1.4 g  Corn,  cup, canned / 2.9 g  Kidney beans,  cup, cooked / 3.5 g  Lentils, cup, cooked / 3 g  Lima beans,  cup, cooked / 4 g  Navy beans,  cup, cooked / 4 g  Peas,  cup, cooked / 4 g  Popcorn, 3 cups popped, unbuttered / 3.5 g  Potato, baked (with skin), 1 small / 4 g  Potato, baked (without skin), 1 small / 2 g  Ry-Krisp, 4 crackers / 3 g  Saltine crackers, 6 squares / 0 g  Split peas,  cup, cooked / 2.5 g  Yams (sweet potato),  cup / 1.7 g Fruit / Dietary Fiber (g)  Apple, 1 small, fresh, with skin / 4 g  Apple juice,  cup / 0.4 g  Apricots, 4 medium, fresh / 4 g  Apricots, 7 halves, dried / 2 g  Banana,  medium / 1.2 g  Blueberries,  cup / 2 g  Cantaloupe, melon / 1.3  g  Cherries,  cup, canned / 1.4 g  Grapefruit,  medium / 1.6 g  Grapes, 15 small / 1.2 g  Grape juice,  cup / 0.5 g  Orange, 1 medium, fresh / 2 g  Orange juice,  cup / 0.5 g  Peach, 1 medium,fresh, with skin / 2 g  Pear, 1 medium, fresh, with skin / 4 g  Pineapple, cup, canned / 0.7 g  Plums, 2 whole / 2 g  Prunes, 3 whole / 1.5 g  Raspberries, 1 cup / 6 g  Strawberries, 1  cup / 4 g  Watermelon, 1  cup / 0.5 g Vegetables / Dietary Fiber (g)  Asparagus,  cup, cooked / 1 g  Beans, green and wax,  cup, cooked / 1.6 g  Beets,  cup, cooked / 1.8 g  Broccoli,  cup, cooked / 2.2 g  Brussels sprouts,  cup, cooked / 4 g  Cabbage,  cup, cooked / 2.5 g  Carrots,  cup, cooked / 2.3 g  Cauliflower,  cup, cooked / 1.1 g  Celery, 1 cup, raw / 1.5 g  Cucumber, 1 cup, raw / 0.8 g  Green pepper,  cup sliced, cooked / 1.5 g  Lettuce, 1 cup, sliced / 0.9 g  Mushrooms, 1 cup sliced, raw / 1.8 g  Onion, 1 cup sliced, raw / 1.6 g  Spinach,  cup, cooked / 2.4 g  Tomato, 1 medium, fresh / 1.5 g  Tomato juice,  cup / 0 g  Zucchini,  cup, cooked / 1.8 g Document Released: 01/14/2002 Document Revised: 11/19/2012 Document Reviewed: 02/10/2009 [facility] Patient Information 2014 [place], [place].

## 2013-10-23 ENCOUNTER — Telehealth: Payer: Self-pay | Admitting: Certified Nurse Midwife

## 2013-10-23 ENCOUNTER — Telehealth: Payer: Self-pay

## 2013-10-23 LAB — THYROID PANEL WITH TSH
Free Thyroxine Index: 4.7 — ABNORMAL HIGH (ref 1.0–3.9)
T3 Uptake: 44.4 % — ABNORMAL HIGH (ref 22.5–37.0)
T4 TOTAL: 10.6 ug/dL (ref 5.0–12.5)
TSH: 0.93 u[IU]/mL (ref 0.350–4.500)

## 2013-10-23 LAB — FOLLICLE STIMULATING HORMONE: FSH: 16.2 m[IU]/mL

## 2013-10-23 NOTE — Telephone Encounter (Signed)
[email or web address]

## 2013-10-23 NOTE — Telephone Encounter (Signed)
Message copied by Susy Manor on Wed Oct 23, 2013  1:00 PM ------      Message from: Regina Eck      Created: Wed Oct 23, 2013  8:09 AM       Notify patient that [facility] does not indicate menopause, but she is perimenopausal with just cycles changing. Be sure to keep up with menses if no menses in 3 months needs to advise. ------

## 2013-10-23 NOTE — Telephone Encounter (Signed)
Left message for patient to call back. Need to review quoted insurance benefits

## 2013-10-24 NOTE — Telephone Encounter (Signed)
Spoke with patient. Message from [place] given as seen below. Patient verbalized understanding and will call back as needed.  Routing to provider for final review. Patient agreeable to disposition. Will close encounter

## 2013-10-25 LAB — VITAMIN D 1,25 DIHYDROXY
Vitamin D 1, 25 (OH)2 Total: 87 pg/mL — ABNORMAL HIGH (ref 18–72)
Vitamin D2 1, 25 (OH)2: 50 pg/mL
Vitamin D3 1, 25 (OH)2: 37 pg/mL

## 2013-10-25 LAB — IPS PAP TEST WITH HPV

## 2013-10-25 NOTE — Progress Notes (Signed)
Reviewed personally.  M. Suzanne Rome Echavarria, MD.  

## 2013-10-26 NOTE — Progress Notes (Signed)
Quick Note:  Please advise pt regarding following labs... Abnormal labs are as follows:  Blood sugar high as we discussed. Thyroid function tests show some slight elevations. I suggest we repeat these tests at your next visit in June.   Try to improve your nutrition and increase physical activity so that you get better control of Diabetes (A1c= 9.3%; the goal in ~7%).  Copy to pt. ______

## 2013-10-28 NOTE — Telephone Encounter (Signed)
Left message to call back  

## 2013-11-01 ENCOUNTER — Encounter: Payer: Self-pay | Admitting: *Deleted

## 2013-11-04 NOTE — Telephone Encounter (Signed)
Left message for patient to call back  

## 2013-11-05 ENCOUNTER — Encounter: Payer: Self-pay | Admitting: Gynecology

## 2013-11-05 ENCOUNTER — Other Ambulatory Visit: Payer: Self-pay | Admitting: *Deleted

## 2013-11-05 ENCOUNTER — Ambulatory Visit (INDEPENDENT_AMBULATORY_CARE_PROVIDER_SITE_OTHER): Payer: 59

## 2013-11-05 ENCOUNTER — Ambulatory Visit (INDEPENDENT_AMBULATORY_CARE_PROVIDER_SITE_OTHER): Payer: 59 | Admitting: Gynecology

## 2013-11-05 DIAGNOSIS — IMO0002 Reserved for concepts with insufficient information to code with codable children: Secondary | ICD-10-CM | POA: Insufficient documentation

## 2013-11-05 DIAGNOSIS — E119 Type 2 diabetes mellitus without complications: Secondary | ICD-10-CM | POA: Insufficient documentation

## 2013-11-05 DIAGNOSIS — N925 Other specified irregular menstruation: Secondary | ICD-10-CM

## 2013-11-05 DIAGNOSIS — N949 Unspecified condition associated with female genital organs and menstrual cycle: Secondary | ICD-10-CM

## 2013-11-05 DIAGNOSIS — E1169 Type 2 diabetes mellitus with other specified complication: Secondary | ICD-10-CM | POA: Insufficient documentation

## 2013-11-05 DIAGNOSIS — E1165 Type 2 diabetes mellitus with hyperglycemia: Secondary | ICD-10-CM | POA: Insufficient documentation

## 2013-11-05 DIAGNOSIS — N938 Other specified abnormal uterine and vaginal bleeding: Secondary | ICD-10-CM

## 2013-11-05 DIAGNOSIS — N926 Irregular menstruation, unspecified: Secondary | ICD-10-CM

## 2013-11-05 MED ORDER — MEDROXYPROGESTERONE ACETATE 10 MG PO TABS: 10.0000 mg | ORAL_TABLET | Freq: Every day | ORAL | Status: DC

## 2013-11-05 NOTE — Progress Notes (Signed)
      Pt here for u/s due to limited exam secondary to habitus. U/S images reviewed with pt.  Uterus is globular with 4 small fibroids-10.1x6x6cm, fibroids are all around 2cm.  EMS 69mm ovaries are normal, no masses. Pt reports that her cycle just ended yesterday, it was very light and mostly when she wiped.  Her cycle in January and February were similar.  Pt reports that her last normal cycle where she flowed was in November, she also had one in October and August. Her lining is thick for just completing her cycle. We reviewed risk factors for endometrial hyperplasia of obesity, diabetes.  Recommend EMB and then treating with progestin to shed the remaining lining. Risks of bleeding, infection and perforation reviewed.  Procedure outlined and consent obtained,  Procedure: Speculum placed, old dark blood noted in vagina, cervix cleansed with betadine x3, xylocaine jelly placed, single tooth placed at 12.  pipelle advanced to 10cm, tissue obtained on 2 passes to pathologhy.  Pt tolerated well Will start provera for 10d/m for 55m

## 2013-11-05 NOTE — Patient Instructions (Signed)
Endometrial Biopsy Post- Procedure Instructions  1. You may take Ibuprofen, Aleve or Tylenol for pain if needed.     Cramping should resolve within 24 hours.  2.  You may have a small amount of spotting. You should wear a mini pad for the next few days.  3. You may have intercourse after 24 hours.  4. You need to call if you have any pelvic pain, fever, heavy bleeding or foul smelling     vaginal discharge.  5. Shower or bathe as normal.  6. We will call you within one week with results or we will discuss the results at your follow-up appointment if needed.  

## 2013-11-05 NOTE — Addendum Note (Signed)
Addended by: Elveria Rising on: 11/05/2013 05:06 PM   Modules accepted: Orders

## 2013-11-08 LAB — IPS OTHER TISSUE BIOPSY

## 2013-11-11 ENCOUNTER — Telehealth: Payer: Self-pay | Admitting: Emergency Medicine

## 2013-11-11 NOTE — Addendum Note (Signed)
Addended by: Elveria Rising on: 11/11/2013 11:36 AM   Modules accepted: Orders

## 2013-11-11 NOTE — Telephone Encounter (Signed)
Message copied by Michele Mcalpine on Mon Nov 11, 2013  3:33 PM ------      Message from: Elveria Rising      Created: Mon Nov 11, 2013 11:34 AM       Inform biopsy is benign, should take the provera as prescribed and repeat biopsy after 42m as lining is disordered ------

## 2013-11-11 NOTE — Telephone Encounter (Signed)
Message left to return call to Jamesia Linnen at [phone].    

## 2013-11-12 NOTE — Telephone Encounter (Signed)
Spoke with patient and message from Dr. Charlies Constable discussed. She is agreeable to plan. Currently taking provera as directed.   Scheduled follow up endometrial biopsy for 02/14/14 with Dr. Charlies Constable. Order is in and pre-certed. Patient agreeable.  Pre-procedure instructions given. Motrin instructions given. Motrin=Advil=Ibuprofen Can take 800 mg (Can purchase over the counter, you will need four 200 mg pills). Take with food. Make sure to eat a meal and drink fluids prior to appointment.     Routing to provider for final review. Patient agreeable to disposition. Will close encounter

## 2013-11-15 ENCOUNTER — Other Ambulatory Visit: Payer: Self-pay | Admitting: Family Medicine

## 2013-11-26 ENCOUNTER — Encounter: Payer: Self-pay | Admitting: Family Medicine

## 2013-12-02 ENCOUNTER — Other Ambulatory Visit: Payer: 59

## 2013-12-04 ENCOUNTER — Ambulatory Visit
Admission: RE | Admit: 2013-12-04 | Discharge: 2013-12-04 | Disposition: A | Discharge status: Home / Self Care | Payer: 59 | Source: Ambulatory Visit | Attending: Family Medicine | Admitting: Family Medicine

## 2013-12-04 DIAGNOSIS — R928 Other abnormal and inconclusive findings on diagnostic imaging of breast: Secondary | ICD-10-CM

## 2013-12-10 ENCOUNTER — Ambulatory Visit: Payer: 59 | Admitting: Family Medicine

## 2013-12-12 ENCOUNTER — Ambulatory Visit (INDEPENDENT_AMBULATORY_CARE_PROVIDER_SITE_OTHER): Payer: 59 | Admitting: Family Medicine

## 2013-12-12 ENCOUNTER — Encounter: Payer: Self-pay | Admitting: Family Medicine

## 2013-12-12 VITALS — BP 148/78 | HR 86 | Temp 98.3°F | Resp 16 | Ht 63.0 in | Wt 273.2 lb

## 2013-12-12 DIAGNOSIS — M25559 Pain in unspecified hip: Secondary | ICD-10-CM

## 2013-12-12 DIAGNOSIS — IMO0001 Reserved for inherently not codable concepts without codable children: Secondary | ICD-10-CM

## 2013-12-12 DIAGNOSIS — M25552 Pain in left hip: Principal | ICD-10-CM

## 2013-12-12 DIAGNOSIS — M25551 Pain in right hip: Principal | ICD-10-CM

## 2013-12-12 DIAGNOSIS — G8929 Other chronic pain: Secondary | ICD-10-CM

## 2013-12-12 DIAGNOSIS — M791 Myalgia, unspecified site: Secondary | ICD-10-CM

## 2013-12-12 DIAGNOSIS — M25561 Pain in right knee: Principal | ICD-10-CM

## 2013-12-12 DIAGNOSIS — M25562 Pain in left knee: Principal | ICD-10-CM

## 2013-12-12 LAB — SEDIMENTATION RATE: Sed Rate: 32 mm/hr — ABNORMAL HIGH (ref 0–22)

## 2013-12-12 MED ORDER — TRAMADOL HCL 50 MG PO TABS: ORAL_TABLET | ORAL | Status: DC

## 2013-12-12 NOTE — Patient Instructions (Signed)
Arthralgia  Your caregiver has diagnosed you as suffering from an arthralgia. Arthralgia means there is pain in a joint. This can come from many reasons including:  · Bruising the joint which causes soreness (inflammation) in the joint.  · Wear and tear on the joints which occur as we grow older (osteoarthritis).  · Overusing the joint.  · Various forms of arthritis.  · Infections of the joint.  Regardless of the cause of pain in your joint, most of these different pains respond to anti-inflammatory drugs and rest. The exception to this is when a joint is infected, and these cases are treated with antibiotics, if it is a bacterial infection.  HOME CARE INSTRUCTIONS   · Rest the injured area for as long as directed by your caregiver. Then slowly start using the joint as directed by your caregiver and as the pain allows. Crutches as directed may be useful if the ankles, knees or hips are involved. If the knee was splinted or casted, continue use and care as directed. If an stretchy or elastic wrapping bandage has been applied today, it should be removed and re-applied every 3 to 4 hours. It should not be applied tightly, but firmly enough to keep swelling down. Watch toes and feet for swelling, bluish discoloration, coldness, numbness or excessive pain. If any of these problems (symptoms) occur, remove the ace bandage and re-apply more loosely. If these symptoms persist, contact your caregiver or return to this location.  · For the first 24 hours, keep the injured extremity elevated on pillows while lying down.  · Apply ice for 15-20 minutes to the sore joint every couple hours while awake for the first half day. Then 03-04 times per day for the first 48 hours. Put the ice in a plastic bag and place a towel between the bag of ice and your skin.  · Wear any splinting, casting, elastic bandage applications, or slings as instructed.  · Only take over-the-counter or prescription medicines for pain, discomfort, or fever as  directed by your caregiver. Do not use aspirin immediately after the injury unless instructed by your physician. Aspirin can cause increased bleeding and bruising of the tissues.  · If you were given crutches, continue to use them as instructed and do not resume weight bearing on the sore joint until instructed.  Persistent pain and inability to use the sore joint as directed for more than 2 to 3 days are warning signs indicating that you should see a caregiver for a follow-up visit as soon as possible. Initially, a hairline fracture (break in bone) may not be evident on X-rays. Persistent pain and swelling indicate that further evaluation, non-weight bearing or use of the joint (use of crutches or slings as instructed), or further X-rays are indicated. X-rays may sometimes not show a small fracture until a week or 10 days later. Make a follow-up appointment with your own caregiver or one to whom we have referred you. A radiologist (specialist in reading X-rays) may read your X-rays. Make sure you know how you are to obtain your X-ray results. Do not assume everything is normal if you do not hear from us.  SEEK MEDICAL CARE IF:  Bruising, swelling, or pain increases.  SEEK IMMEDIATE MEDICAL CARE IF:   · Your fingers or toes are numb or blue.  · The pain is not responding to medications and continues to stay the same or get worse.  · The pain in your joint becomes severe.  · You   develop a fever over 102° F (38.9° C).  · It becomes impossible to move or use the joint.  MAKE SURE YOU:   · Understand these instructions.  · Will watch your condition.  · Will get help right away if you are not doing well or get worse.  Document Released: 07/25/2005 Document Revised: 10/17/2011 Document Reviewed: 03/12/2008  [employer]® Patient Information ©2014 [employer].

## 2013-12-13 LAB — RHEUMATOID FACTOR: Rhuematoid fact SerPl-aCnc: <10 IU/mL (ref <=14)

## 2013-12-13 LAB — ANA: ANA: NEGATIVE

## 2013-12-15 NOTE — Progress Notes (Signed)
Quick Note:  Please advise pt regarding following labs... The lab results do not show any significantly abnormal results. The Sed Rate is a little above normal but consistent with arthritis.  Continue taking current medications.  Copy to pt. ______

## 2013-12-17 ENCOUNTER — Telehealth: Payer: Self-pay | Admitting: *Deleted

## 2013-12-17 NOTE — Telephone Encounter (Signed)
Pt called back concerning lab results . Pt aware and copy sent to pt.

## 2013-12-17 NOTE — Progress Notes (Signed)
Subjective:    Patient ID: Jill [name], female    DOB: 1961/01/11, 53 y.o.   MRN: [id number]  HPI  This 53 y.o. AA female has Type II DM- uncontrolled, fatigue w/ arthralgias, OSA and muscle aches w/ soreness. Chronic knee pain is now accompanied by low back pain, stiffness in most major joints and muscle soreness. These problems limit activity and make weight reduction impossible. Shoulder pain w/ neck stiffness has been present for 3+ months.   Family hx significant for arthritis.  Patient Active Problem List   Diagnosis Date Noted  . Diabetes mellitus without complication   . OBESITY, MORBID 07/30/2009  . DYSPNEA ON EXERTION 07/30/2009  . DIABETES MELLITUS, TYPE II, UNCONTROLLED 05/19/2009  . OBSTRUCTIVE SLEEP APNEA 05/19/2009  . FATIGUE 05/19/2009  . SYMPTOM, DISTURBANCE, SLEEP NOS 06/01/2007  . MOTION SICKNESS 03/12/2007  . ASTHMA 01/31/2007  . GERD 01/31/2007  . MIGRAINE, MENSTRUAL 01/31/2007   Prior to Admission medications   Medication Sig Start Date End Date Taking? Authorizing Provider  Cetirizine HCl (ZYRTEC PO) Take by mouth daily.   Yes Historical Provider, MD  glipiZIDE (GLUCOTROL) 5 MG tablet Take 1 tablet (5 mg total) by mouth 2 (two) times daily before a meal. 10/22/13  Yes Barton Fanny, MD  losartan (COZAAR) 25 MG tablet Take 1 tablet (25 mg total) by mouth daily. 10/22/13  Yes Barton Fanny, MD  medroxyPROGESTERone (PROVERA) 10 MG tablet Take 1 tablet (10 mg total) by mouth daily. FOR 10D/M 11/05/13  Yes Azalia Bilis, MD  Multiple Vitamin (MULTIVITAMIN) tablet Take 1 tablet by mouth daily.   Yes Historical Provider, MD  OVER THE COUNTER MEDICATION OTC Hair,Skin, and Nail  B Complex taking  twice daily   Yes Historical Provider, MD  TRUETEST TEST test strip USE ONCE DAILY 11/15/13  Yes Barton Fanny, MD  fexofenadine (ALLEGRA) 180 MG tablet Take 1 tablet (180 mg total) by mouth daily. 01/11/13   Barton Fanny, MD    PMHx, Surg Hx, Soc  and Fam Hx reviewed.   Review of Systems  Constitutional: Positive for activity change and fatigue. Negative for fever, chills, diaphoresis, appetite change and unexpected weight change.  Eyes: Negative.   Respiratory: Positive for apnea and shortness of breath. Negative for cough, choking, chest tightness and wheezing.   Cardiovascular: Negative.   Genitourinary: Negative.   Musculoskeletal: Positive for arthralgias, back pain, myalgias and neck stiffness. Negative for joint swelling and neck pain.  Skin: Negative.   Neurological: Negative.   Psychiatric/Behavioral: Positive for sleep disturbance.       Objective:   Physical Exam  Nursing note and vitals reviewed. Constitutional: She is oriented to person, place, and time. She appears well-developed and well-nourished. No distress.  Pt is obese.  HENT:  Head: Normocephalic and atraumatic.  Right Ear: External ear normal.  Left Ear: External ear normal.  Nose: Nose normal.  Mouth/Throat: Oropharynx is clear and moist.  Eyes: Conjunctivae and EOM are normal. Pupils are equal, round, and reactive to light. No scleral icterus.  Neck: Neck supple. Spinous process tenderness and muscular tenderness present. Decreased range of motion present.  Cardiovascular: Normal rate and regular rhythm.   Pulmonary/Chest: Effort normal and breath sounds normal. No respiratory distress.  Musculoskeletal:       Right shoulder: She exhibits decreased range of motion, tenderness, crepitus and spasm. She exhibits no effusion and no deformity.       Left shoulder: She exhibits decreased range of  motion, tenderness and crepitus. She exhibits no deformity and no spasm.       Right hip: She exhibits decreased range of motion.       Left hip: She exhibits decreased range of motion.       Right knee: She exhibits decreased range of motion. She exhibits no effusion and no deformity. Tenderness found.       Left knee: She exhibits decreased range of motion. She  exhibits no effusion and no deformity. Tenderness found.       Cervical back: She exhibits decreased range of motion, tenderness and spasm. She exhibits no bony tenderness, no edema, no deformity and no pain.       Thoracic back: She exhibits tenderness. She exhibits no bony tenderness, no deformity, no pain and no spasm.       Lumbar back: She exhibits decreased range of motion, tenderness and spasm. She exhibits no bony tenderness, no deformity and no pain.       Right upper arm: She exhibits tenderness. She exhibits no swelling, no edema and no deformity.       Left upper arm: She exhibits tenderness. She exhibits no swelling, no edema and no deformity.       Right hand: She exhibits decreased range of motion, tenderness and swelling. She exhibits no bony tenderness and no deformity.       Left hand: She exhibits decreased range of motion, tenderness and swelling. She exhibits no bony tenderness and no deformity.       Right lower leg: She exhibits tenderness. She exhibits no swelling and no edema.       Left lower leg: She exhibits tenderness. She exhibits no swelling and no edema.  Neurological: She is alert and oriented to person, place, and time. She has normal strength. No cranial nerve deficit or sensory deficit. Coordination and gait normal.  Gait slightly antalgic.  Skin: Skin is warm and dry. No ecchymosis, no lesion and no rash noted. She is not diaphoretic. No erythema.      Assessment & Plan:  Chronic arthralgias of knees and hips - RX: Tramadol for bedtime use. Plan: Sedimentation rate, ANA, Rheumatoid factor  Muscle pain- Labs as noted. Pt may have fatigue and muscle aches due to multiple factors (uncontrolled DM, OSA, obesity, deconditioning, etc).  Meds ordered this encounter  Medications  . Cetirizine HCl (ZYRTEC PO)    Sig: Take by mouth daily.  . traMADol (ULTRAM) 50 MG tablet    Sig: Take 1-2 tablets at bedtime for pain.    Dispense:  40 tablet    Refill:  1    Encouraged weight reduction; pt mentions exercise plan with low-impact aerobics. Focus on improving nutrition also,

## 2013-12-27 ENCOUNTER — Encounter: Payer: Self-pay | Admitting: Radiology

## 2014-01-21 ENCOUNTER — Ambulatory Visit (INDEPENDENT_AMBULATORY_CARE_PROVIDER_SITE_OTHER): Payer: 59 | Admitting: Family Medicine

## 2014-01-21 ENCOUNTER — Encounter: Payer: Self-pay | Admitting: Family Medicine

## 2014-01-21 VITALS — BP 127/68 | HR 97 | Temp 98.4°F | Resp 20 | Ht 62.75 in | Wt 275.4 lb

## 2014-01-21 DIAGNOSIS — M79609 Pain in unspecified limb: Secondary | ICD-10-CM

## 2014-01-21 DIAGNOSIS — IMO0001 Reserved for inherently not codable concepts without codable children: Secondary | ICD-10-CM

## 2014-01-21 DIAGNOSIS — E1165 Type 2 diabetes mellitus with hyperglycemia: Principal | ICD-10-CM

## 2014-01-21 DIAGNOSIS — M79671 Pain in right foot: Secondary | ICD-10-CM

## 2014-01-21 DIAGNOSIS — M79672 Pain in left foot: Secondary | ICD-10-CM

## 2014-01-21 LAB — POCT GLYCOSYLATED HEMOGLOBIN (HGB A1C): HEMOGLOBIN A1C: 9.4

## 2014-01-21 MED ORDER — GLIPIZIDE 10 MG PO TABS: ORAL_TABLET | ORAL | Status: DC

## 2014-01-21 MED ORDER — METFORMIN HCL 500 MG PO TABS: ORAL_TABLET | ORAL | Status: DC

## 2014-01-21 NOTE — Patient Instructions (Addendum)
I am referring you to Diabetes Nutrition and Education. Someone will call you with an appointment; if you need to make adjustment in the time of your first visit, please do so.  I am increasing Glipizide to 10 mg 1 tablet twice a day before meals and adding Metformin 500 mg 1 tablet once a day after largest meal. If you still cannot tolerate this medication, stop taking it and let me know.    Plantar Fasciitis Plantar fasciitis is a common condition that causes foot pain. It is soreness (inflammation) of the band of tough fibrous tissue on the bottom of the foot that runs from the heel bone (calcaneus) to the ball of the foot. The cause of this soreness may be from excessive standing, poor fitting shoes, running on hard surfaces, being overweight, having an abnormal walk, or overuse (this is common in runners) of the painful foot or feet. It is also common in aerobic exercise dancers and ballet dancers. SYMPTOMS  Most people with plantar fasciitis complain of:  Severe pain in the morning on the bottom of their foot especially when taking the first steps out of bed. This pain recedes after a few minutes of walking.  Severe pain is experienced also during walking following a long period of inactivity.  Pain is worse when walking barefoot or up stairs DIAGNOSIS   Your caregiver will diagnose this condition by examining and feeling your foot.  Special tests such as X-rays of your foot, are usually not needed. PREVENTION   Consult a sports medicine professional before beginning a new exercise program.  Walking programs offer a good workout. With walking there is a lower chance of overuse injuries common to runners. There is less impact and less jarring of the joints.  Begin all new exercise programs slowly. If problems or pain develop, decrease the amount of time or distance until you are at a comfortable level.  Wear good shoes and replace them regularly.  Stretch your foot and the heel  cords at the back of the ankle (Achilles tendon) both before and after exercise.  Run or exercise on even surfaces that are not hard. For example, asphalt is better than pavement.  Do not run barefoot on hard surfaces.  If using a treadmill, vary the incline.  Do not continue to workout if you have foot or joint problems. Seek professional help if they do not improve. HOME CARE INSTRUCTIONS   Avoid activities that cause you pain until you recover.  Use ice or cold packs on the problem or painful areas after working out.  Only take over-the-counter or prescription medicines for pain, discomfort, or fever as directed by your caregiver.  Soft shoe inserts or athletic shoes with air or gel sole cushions may be helpful.  If problems continue or become more severe, consult a sports medicine caregiver or your own health care provider. Cortisone is a potent anti-inflammatory medication that may be injected into the painful area. You can discuss this treatment with your caregiver. MAKE SURE YOU:   Understand these instructions.  Will watch your condition.  Will get help right away if you are not doing well or get worse. Document Released: 04/19/2001 Document Revised: 10/17/2011 Document Reviewed: 06/18/2008 [facility] Patient Information 2014 [place], [place].

## 2014-01-21 NOTE — Progress Notes (Signed)
S:  This 53 y.o. AA female returns for HTN and DM follow-up. FSBS rarely below 160. Last A1c= 9.3%. Pt thinks Glipizide ineffective. Metformin caused diarrhea in the past but pt willing to try it again. She has altered nutrition, eating mostly  Fruits and vegetables and has given up sodas. Despite best efforts, she has gained weight. Recently completed hormone treatment prescribed by GYN; thinks this may be contributing to weight gain. Not able to exercise much due to persistent bilateral heel pain. Pain not present today but usually occurs early in the day and increased w/ ambulation. No hx of injury. Tramadol not effective and seems to make joints hurt worse.   Patient Active Problem List   Diagnosis Date Noted  . Diabetes mellitus without complication   . OBESITY, MORBID 07/30/2009  . DYSPNEA ON EXERTION 07/30/2009  . DIABETES MELLITUS, TYPE II, UNCONTROLLED 05/19/2009  . OBSTRUCTIVE SLEEP APNEA 05/19/2009  . FATIGUE 05/19/2009  . SYMPTOM, DISTURBANCE, SLEEP NOS 06/01/2007  . MOTION SICKNESS 03/12/2007  . ASTHMA 01/31/2007  . GERD 01/31/2007  . MIGRAINE, MENSTRUAL 01/31/2007   Prior to Admission medications   Medication Sig Start Date End Date Taking? Authorizing Provider  Cetirizine HCl (ZYRTEC PO) Take by mouth daily.   Yes Historical Provider, MD  glipiZIDE (GLUCOTROL) 5 MG tablet Take 1 tablet twice a day before main meals.   Yes Barton Fanny, MD  losartan (COZAAR) 25 MG tablet Take 1 tablet (25 mg total) by mouth daily. 10/22/13  Yes Barton Fanny, MD  Multiple Vitamin (MULTIVITAMIN) tablet Take 1 tablet by mouth daily.   Yes Historical Provider, MD  OVER THE COUNTER MEDICATION OTC Hair,Skin, and Nail  B Complex taking  twice daily   Yes Historical Provider, MD  TRUETEST TEST test strip USE ONCE DAILY 11/15/13  Yes Barton Fanny, MD  fexofenadine (ALLEGRA) 180 MG tablet Take 1 tablet (180 mg total) by mouth daily. 01/11/13   Barton Fanny, MD  medroxyPROGESTERone  (PROVERA) 10 MG tablet Take 1 tablet (10 mg total) by mouth daily. FOR 10D/M 11/05/13   Azalia Bilis, MD  traMADol (ULTRAM) 50 MG tablet Take 1-2 tablets at bedtime for pain. 12/12/13   Barton Fanny, MD   PMHx, Surg Hx, Soc and Fam Hx reviewed.  ROS: As per HPI; negative for anorexia, diaphoresis, fatigue, vision disturbances, CP or tightness, palpitations, edema, cough, SOB or DOE, abd pain, heat or cold intolerance, HA, dizziness, numbness, weakness or syncope.  O: Filed Vitals:   01/21/14 1547  BP: 127/68  Pulse: 97  Temp: 98.4 F (36.9 C)  Resp: 20   GEN: in NAD; WN,WD. Pt is obese w/ BMI= 49.17 kg/m2. HENT: [place]/AT; EOMI w/ clear conj/sclerae. Otherwise unremarkable. COR: RRR. No edema. LUNGS: Unlabored resp. SKIN: W&D; intact w/o diaphoresis, erythema or rashes. Dry skin on feet. MS: MAEs; Feet- trace edema; difficult to palpate pulses. No heel tenderness w/ deep compression of plantar aspect. NEURO: A&Ox 3; CNs intact. Nonfocal.  Results for orders placed in visit on 01/21/14  POCT GLYCOSYLATED HEMOGLOBIN (HGB A1C)      Result Value Ref Range   Hemoglobin A1C 9.4      A/P: DIABETES MELLITUS, TYPE II, UNCONTROLLED - DM education/ nutrition referral; increase Glipizide to 10 mg 1 tablet twice daily with main meals. Pt wants to try  Metformin again-  500 mg 1 tablet once a day after largest meal. Plan: POCT glycosylated hemoglobin (Hb A1C), Ambulatory referral to diabetic education  OBESITY, MORBID - Plan: Ambulatory referral to diabetic education  Heel pain, bilateral- Pt will try insoles and/or heel cups; weight reduction needed. Oral and topical analgesics may be helpful.  Meds ordered this encounter  Medications  . glipiZIDE (GLUCOTROL) 10 MG tablet    Sig: Take 1 tablet twice a day before main meals.    Dispense:  60 tablet    Refill:  3  . metFORMIN (GLUCOPHAGE) 500 MG tablet    Sig: Take 1 tablet once a day after largest meal.    Dispense:  30 tablet     Refill:  3

## 2014-02-14 ENCOUNTER — Ambulatory Visit (INDEPENDENT_AMBULATORY_CARE_PROVIDER_SITE_OTHER): Payer: 59 | Admitting: Gynecology

## 2014-02-14 ENCOUNTER — Encounter: Payer: Self-pay | Admitting: Gynecology

## 2014-02-14 DIAGNOSIS — N926 Irregular menstruation, unspecified: Secondary | ICD-10-CM

## 2014-02-14 DIAGNOSIS — Q632 Ectopic kidney: Secondary | ICD-10-CM | POA: Insufficient documentation

## 2014-02-14 DIAGNOSIS — N949 Unspecified condition associated with female genital organs and menstrual cycle: Secondary | ICD-10-CM

## 2014-02-14 DIAGNOSIS — N938 Other specified abnormal uterine and vaginal bleeding: Secondary | ICD-10-CM

## 2014-02-14 DIAGNOSIS — E119 Type 2 diabetes mellitus without complications: Secondary | ICD-10-CM

## 2014-02-14 DIAGNOSIS — Q638 Other specified congenital malformations of kidney: Secondary | ICD-10-CM

## 2014-02-14 LAB — POCT WET PREP (WET MOUNT): Clue Cells Wet Prep Whiff POC: NEGATIVE

## 2014-02-14 NOTE — Progress Notes (Signed)
Pt here for repeat EMB.  EMB done 3/15 for DUB showed disordered proliferative endometrium, she was treated with 3 monthly cycles of provera.  Pt states that she bled heavy for 7-10d with each treatment.  Pt was using pads and tampons changing every 2-4h for 5 of the days.  No clots.  No cramping.  Not sexually active over 10y. Pt states DM is better controled. Pt here for repeat EMB. Consent obtained  BME limited by habitus. AV uterus, no CMT Speculum: thick white discharge with some blood. Cervix without lesion.  WP obtained. Cervix cleansed with betadine x3, xylocaine jelly  2% placed anteriorly.  Cervix stabilized with single tooth, pipelle advanced with ease, sounded to 11, adequate tissue obtained on 2 passes.  Tolerated well. Tissue to pathology Will triage accordingly. Based on flow described will check FSH to confirm post-menopausal

## 2014-02-14 NOTE — Patient Instructions (Signed)
Endometrial Biopsy Post- Procedure Instructions  1. You may take Ibuprofen, Aleve or Tylenol for pain if needed.     Cramping should resolve within 24 hours.  2.  You may have a small amount of spotting. You should wear a mini pad for the next few days.  3. You may have intercourse after 24 hours.  4. You need to call if you have any pelvic pain, fever, heavy bleeding or foul smelling     vaginal discharge.  5. Shower or bathe as normal.  6. We will call you within one week with results or we will discuss the results at your follow-up appointment if needed.

## 2014-02-15 LAB — FOLLICLE STIMULATING HORMONE: FSH: 23.6 m[IU]/mL

## 2014-02-17 ENCOUNTER — Telehealth: Payer: Self-pay | Admitting: *Deleted

## 2014-02-17 NOTE — Telephone Encounter (Signed)
Message copied by Alfonzo Feller on Mon Feb 17, 2014  8:42 AM ------      Message from: Elveria Rising      Created: Mon Feb 17, 2014  8:05 AM       Inform [facility] is not post-menopausal, may want to consider IUD as we had discussed, biopsy pending ------

## 2014-02-17 NOTE — Telephone Encounter (Signed)
Left Message To Call Back  

## 2014-02-18 LAB — IPS OTHER TISSUE BIOPSY

## 2014-02-19 ENCOUNTER — Other Ambulatory Visit: Payer: Self-pay | Admitting: Gynecology

## 2014-02-19 DIAGNOSIS — N926 Irregular menstruation, unspecified: Secondary | ICD-10-CM

## 2014-02-19 DIAGNOSIS — N938 Other specified abnormal uterine and vaginal bleeding: Secondary | ICD-10-CM

## 2014-02-19 MED ORDER — MISOPROSTOL 200 MCG PO TABS: ORAL_TABLET | ORAL | Status: DC

## 2014-02-19 MED ORDER — MEDROXYPROGESTERONE ACETATE 10 MG PO TABS: 10.0000 mg | ORAL_TABLET | Freq: Every day | ORAL | Status: DC

## 2014-02-19 NOTE — Telephone Encounter (Signed)
Patient notified see result note 

## 2014-02-21 ENCOUNTER — Telehealth: Payer: Self-pay

## 2014-02-21 NOTE — Telephone Encounter (Signed)
Pt is taking 2 medications for Diabetes (Metfromin and Glucotrol). Her last A1c= 9.4%.   She needs to check to be sure she is following a meal plan and taking medications as directed w/ meals. She can hold Metformin over the weekend and see if she feels better. If she feels worse, she needs to be evaluated at 102 this weekend.

## 2014-02-21 NOTE — Telephone Encounter (Signed)
Noted, thanks I called patient to advise. Left message for call back so I can advise.

## 2014-02-21 NOTE — Telephone Encounter (Signed)
Pt is taking metFORMIN and Lipitor, she said that her blood sugar has been 56,51,and 101 in the last couple of days, she does not feel well and would like to know if she should adjust her meds?

## 2014-02-21 NOTE — Telephone Encounter (Signed)
LM for rtn call. 

## 2014-03-05 ENCOUNTER — Telehealth: Payer: Self-pay | Admitting: Gynecology

## 2014-03-05 NOTE — Telephone Encounter (Signed)
Left message for patient to call back. Need to go over IUD benefits °

## 2014-03-07 NOTE — Telephone Encounter (Signed)
Have tried x2 more times. Unable to reach

## 2014-03-09 ENCOUNTER — Other Ambulatory Visit: Payer: Self-pay | Admitting: Family Medicine

## 2014-03-10 ENCOUNTER — Ambulatory Visit: Payer: 59 | Admitting: *Deleted

## 2014-03-18 ENCOUNTER — Telehealth: Payer: Self-pay | Admitting: Gynecology

## 2014-03-18 NOTE — Telephone Encounter (Signed)
Spoke with patient. Advised that per benefits quote received, IUD and insertion is covered at 100% after $35 copay. There will be $35 patient liability. Patient is to call within the first 5 days of her cycle to schedule insertion.

## 2014-04-02 ENCOUNTER — Ambulatory Visit (INDEPENDENT_AMBULATORY_CARE_PROVIDER_SITE_OTHER): Payer: 59 | Admitting: Family Medicine

## 2014-04-02 ENCOUNTER — Encounter: Payer: Self-pay | Admitting: Family Medicine

## 2014-04-02 VITALS — BP 136/72 | HR 79 | Temp 98.0°F | Resp 16 | Ht 63.0 in | Wt 266.4 lb

## 2014-04-02 DIAGNOSIS — E1165 Type 2 diabetes mellitus with hyperglycemia: Principal | ICD-10-CM

## 2014-04-02 DIAGNOSIS — IMO0001 Reserved for inherently not codable concepts without codable children: Secondary | ICD-10-CM

## 2014-04-02 LAB — POCT GLYCOSYLATED HEMOGLOBIN (HGB A1C): HEMOGLOBIN A1C: 7.3

## 2014-04-02 LAB — LIPID PANEL
Cholesterol: 163 mg/dL (ref 0–200)
HDL: 49 mg/dL (ref >39)
LDL Cholesterol: 104 mg/dL — ABNORMAL HIGH (ref 0–99)
Total CHOL/HDL Ratio: 3.3 Ratio
Triglycerides: 49 mg/dL (ref <150)
VLDL: 10 mg/dL (ref 0–40)

## 2014-04-02 MED ORDER — METFORMIN HCL 500 MG PO TABS: ORAL_TABLET | ORAL | Status: DC

## 2014-04-02 MED ORDER — GLUCOSE BLOOD VI STRP: ORAL_STRIP | Status: DC

## 2014-04-02 MED ORDER — GLIPIZIDE 10 MG PO TABS: ORAL_TABLET | ORAL | Status: DC

## 2014-04-02 NOTE — Patient Instructions (Signed)
Mediterranean Diet  Why follow it? Research shows.   Those who follow the Mediterranean diet have a reduced risk of heart disease    The diet is associated with a reduced incidence of Parkinson's and Alzheimer's diseases   People following the diet may have longer life expectancies and lower rates of chronic diseases    The Dietary Guidelines for Americans recommends the Mediterranean diet as an eating plan to promote health and prevent disease  What Is the Mediterranean Diet?    Healthy eating plan based on typical foods and recipes of Mediterranean-style cooking   The diet is primarily a plant based diet; these foods should make up a majority of meals   Starches - Plant based foods should make up a majority of meals - They are an important sources of vitamins, minerals, energy, antioxidants, and fiber - Choose whole grains, foods high in fiber and minimally processed items  - Typical grain sources include wheat, oats, barley, corn, [place] rice, bulgar, farro, millet, polenta, couscous  - Various types of beans include chickpeas, lentils, fava beans, black beans, white beans   Fruits  Veggies - Large quantities of antioxidant rich fruits & veggies; 6 or more servings  - Vegetables can be eaten raw or lightly drizzled with oil and cooked  - Vegetables common to the traditional Mediterranean Diet include: artichokes, arugula, beets, broccoli, brussel sprouts, cabbage, carrots, celery, collard greens, cucumbers, eggplant, kale, leeks, lemons, lettuce, mushrooms, okra, onions, peas, peppers, potatoes, pumpkin, radishes, rutabaga, shallots, spinach, sweet potatoes, turnips, zucchini - Fruits common to the Mediterranean Diet include: apples, apricots, avocados, cherries, clementines, dates, figs, grapefruits, grapes, melons, nectarines, oranges, peaches, pears, pomegranates, strawberries, tangerines  Fats - Replace butter and margarine with healthy oils, such as olive oil, canola oil, and tahini   - Limit nuts to no more than a handful a day  - Nuts include walnuts, almonds, pecans, pistachios, pine nuts  - Limit or avoid candied, honey roasted or heavily salted nuts - Olives are central to the Mediterranean diet - can be eaten whole or used in a variety of dishes   Meats Protein - Limiting red meat: no more than a few times a month - When eating red meat: choose lean cuts and keep the portion to the size of deck of cards - Eggs: approx. 0 to 4 times a week  - Fish and lean poultry: at least 2 a week  - Healthy protein sources include, chicken, turkey, lean beef, lamb - Increase intake of seafood such as tuna, salmon, trout, mackerel, shrimp, scallops - Avoid or limit high fat processed meats such as sausage and bacon  Dairy - Include moderate amounts of low fat dairy products  - Focus on healthy dairy such as fat free yogurt, skim milk, low or reduced fat cheese - Limit dairy products higher in fat such as whole or 2% milk, cheese, ice cream  Alcohol - Moderate amounts of red wine is ok  - No more than 5 oz daily for women (all ages) and men older than age 65  - No more than 10 oz of wine daily for men younger than 65  Other - Limit sweets and other desserts  - Use herbs and spices instead of salt to flavor foods  - Herbs and spices common to the traditional Mediterranean Diet include: basil, bay leaves, chives, cloves, cumin, fennel, garlic, lavender, marjoram, mint, oregano, parsley, pepper, rosemary, sage, savory, sumac, tarragon, thyme   It's not just a diet,   it's a lifestyle:    The Mediterranean diet includes lifestyle factors typical of those in the region    Foods, drinks and meals are best eaten with others and savored   Daily physical activity is important for overall good health   This could be strenuous exercise like running and aerobics   This could also be more leisurely activities such as walking, housework, yard-work, or taking the stairs   Moderation is the key;  a balanced and healthy diet accommodates most foods and drinks   Consider portion sizes and frequency of consumption of certain foods   Meal Ideas & Options:    Breakfast:  o Whole wheat toast or whole wheat English muffins with peanut butter & hard boiled egg o Steel cut oats topped with apples & cinnamon and skim milk  o Fresh fruit: banana, strawberries, melon, berries, peaches  o Smoothies: strawberries, bananas, greek yogurt, peanut butter o Low fat greek yogurt with blueberries and granola  o Egg white omelet with spinach and mushrooms o Breakfast couscous: whole wheat couscous, apricots, skim milk, cranberries    Sandwiches:  o Hummus and grilled vegetables (peppers, zucchini, squash) on whole wheat bread   o Grilled chicken on whole wheat pita with lettuce, tomatoes, cucumbers or tzatziki  o Tuna salad on whole wheat bread: tuna salad made with greek yogurt, olives, red peppers, capers, green onions o Garlic rosemary lamb pita: lamb sauted with garlic, rosemary, salt & pepper; add lettuce, cucumber, greek yogurt to pita - flavor with lemon juice and black pepper    Seafood:  o Mediterranean grilled salmon, seasoned with garlic, basil, parsley, lemon juice and black pepper o Shrimp, lemon, and spinach whole-grain pasta salad made with low fat greek yogurt  o Seared scallops with lemon orzo  o Seared tuna steaks seasoned salt, pepper, coriander topped with tomato mixture of olives, tomatoes, olive oil, minced garlic, parsley, green onions and cappers    Meats:  o Herbed greek chicken salad with kalamata olives, cucumber, feta  o Red bell peppers stuffed with spinach, bulgur, lean ground beef (or lentils) & topped with feta   o Kebabs: skewers of chicken, tomatoes, onions, zucchini, squash  o Turkey burgers: made with red onions, mint, dill, lemon juice, feta cheese topped with roasted red peppers   Vegetarian o Cucumber salad: cucumbers, artichoke hearts, celery, red onion, feta  cheese, tossed in olive oil & lemon juice  o Hummus and whole grain pita points with a greek salad (lettuce, tomato, feta, olives, cucumbers, red onion) o Lentil soup with celery, carrots made with vegetable broth, garlic, salt and pepper  o Tabouli salad: parsley, bulgur, mint, scallions, cucumbers, tomato, radishes, lemon juice, olive oil, salt and pepper. o  

## 2014-04-02 NOTE — Progress Notes (Signed)
S:  This 53 y.o. AA female is here for DM follow-up and fasting labs (lipids).  June 2015 A1c= 9.4%.  Pt is compliant with medications but not checking FSBS daily due to cost of strips. No reported hypoglycemia. She has been actively working on weight loss.   Patient Active Problem List   Diagnosis Date Noted  . Pelvic kidney   . Diabetes mellitus without complication   . OBESITY, MORBID 07/30/2009  . DYSPNEA ON EXERTION 07/30/2009  . DIABETES MELLITUS, TYPE II, UNCONTROLLED 05/19/2009  . OBSTRUCTIVE SLEEP APNEA 05/19/2009  . FATIGUE 05/19/2009  . SYMPTOM, DISTURBANCE, SLEEP NOS 06/01/2007  . MOTION SICKNESS 03/12/2007  . ASTHMA 01/31/2007  . GERD 01/31/2007  . MIGRAINE, MENSTRUAL 01/31/2007   Prior to Admission medications   Medication Sig Start Date End Date Taking? Authorizing Provider  Cetirizine HCl (ZYRTEC PO) Take by mouth daily.   Yes Historical Provider, MD  Cholecalciferol (VITAMIN D-3) 5000 UNITS TABS Take by mouth.   Yes Historical Provider, MD  glipiZIDE (GLUCOTROL) 10 MG tablet Take 1 tablet twice a day before main meals.   Yes Barton Fanny, MD  glucose blood (TRUETEST TEST) test strip Test FSBS twice daily.   Yes Barton Fanny, MD  losartan (COZAAR) 25 MG tablet TAKE 1 TABLET (25 MG TOTAL) BY MOUTH DAILY.   Yes Barton Fanny, MD  magnesium 30 MG tablet Take 30 mg by mouth 2 (two) times daily.   Yes Historical Provider, MD  medroxyPROGESTERone (PROVERA) 10 MG tablet Take 1 tablet (10 mg total) by mouth daily. 02/19/14  Yes Azalia Bilis, MD  metFORMIN (GLUCOPHAGE) 500 MG tablet Take 1 tablet once a day after largest meal.   Yes Barton Fanny, MD  misoprostol (CYTOTEC) 200 MCG tablet 1 po evening before procedure and morning of 02/19/14  Yes Azalia Bilis, MD  Multiple Vitamin (MULTIVITAMIN) tablet Take 1 tablet by mouth daily.   Yes Historical Provider, MD  OVER THE COUNTER MEDICATION OTC Hair,Skin, and Nail  B Complex taking  twice daily   Yes  Historical Provider, MD  medroxyPROGESTERone (PROVERA) 10 MG tablet Take 1 tablet (10 mg total) by mouth daily. FOR 10D/M 11/05/13   Azalia Bilis, MD    History   Social History  . Marital Status: Single    Spouse Name: N/A    Number of Children: N/A  . Years of Education: N/A   Occupational History  . social services case worker   .  [place] History Main Topics  . Smoking status: Never Smoker   . Smokeless tobacco: Never Used  . Alcohol Use: No  . Drug Use: No  . Sexual Activity: No   Other Topics Concern  . Not on file   Social History Narrative   Single. Education: [employer].     Family History  Problem Relation Age of Onset  . Diabetes Maternal Grandmother   . Heart failure Maternal Grandmother   . Heart disease Maternal Grandmother   . Hyperlipidemia Maternal Grandmother   . Hypertension Maternal Grandmother   . Hypertension Maternal Grandfather   . Hyperlipidemia Maternal Grandfather   . Hypertension Mother   . Arthritis Mother   . Other Sister     blood clot  . Cancer Sister   . Hypertension Sister   . Breast cancer Paternal Aunt   . Cancer Paternal Grandfather     ROS: Noncontributory.  ODanley Danker Vitals:   04/02/14 0824  BP:  136/72  Pulse: 79  Temp: 98 F (36.7 C)  Resp: 16   GEN: In NAD; WN,WD. HENT: [place]/AT; EOMI w/ clear conj/sclerae. Otherwise unremarkable. COR: RRR. No edema. LUNGS: Unlabored resp. SKIN: W&D; intact w/o diaphoresis or erythema. Feet- very dry skin w/o ulcerations or rashes. See DM FOOT EXAM. NEURO: A&O x 3; CNs intact. Nonfocal.   Results for orders placed in visit on 04/02/14  POCT GLYCOSYLATED HEMOGLOBIN (HGB A1C)  Value Ref Range   7.3      A/P: DIABETES MELLITUS, TYPE II, UNCONTROLLED - Stable and improved A1c. No medication change. Plan: HM Diabetes Foot Exam, POCT glycosylated hemoglobin (Hb A1C), Lipid panel  OBESITY, MORBID- Encouraged ongoing weight loss and improved nutrition.  Meds  ordered this encounter  Medications  . glucose blood (TRUETEST TEST) test strip    Sig: Test FSBS twice daily.    Dispense:  100 each    Refill:  3  . glipiZIDE (GLUCOTROL) 10 MG tablet    Sig: Take 1 tablet twice a day before main meals.    Dispense:  60 tablet    Refill:  11  . metFORMIN (GLUCOPHAGE) 500 MG tablet    Sig: Take 1 tablet once a day after largest meal.    Dispense:  30 tablet    Refill:  11

## 2014-04-04 NOTE — Progress Notes (Signed)
Quick Note:  Please advise pt regarding following labs...  Lipid panel numbers are better though LDL ("bad") cholesterol is still above norma;. Work on healthy eating and regular exercise; walking is the best activity. Aim for 20-30 minutes of walking most days of the week.  Keep your appointment with the nutritionist on 04/23/2014.   Contact the clinic if you have questions or concerns. ______

## 2014-04-09 ENCOUNTER — Ambulatory Visit: Payer: Self-pay | Admitting: Orthopedic Surgery

## 2014-04-09 NOTE — H&P (Signed)
Jill [name] is an 53 y.o. female.   Chief Complaint: L knee pain HPI: The patient is a 53 year old female who presents for a follow-up for follow-up knee. The patient is being followed for their left knee pain. They are now 2 weeks, 5 days out from when symptoms began. Symptoms reported today include: pain (better), swelling (improved), aching and instability, while the patient does not report symptoms of: giving way. The patient feels that they are doing poorly and report their pain level to be moderate and 6 / 10. Current treatment includes: activity modification and NSAIDs. The following medication has been used for pain control: ibuprofen. The patient presents today following MRI. The patient has not gotten any relief of their symptoms with activity modification. Note for "Follow-up Knee": The knee is feeling less unstable and less swollen but is still fairly painful and limiting her activity due to pain. She does have a knee sleeve which she is using prn for support. She is taking ibuprofen prn, using voltaren gel locally with some relief.  Past Medical History  Diagnosis Date  . Allergy   . Diabetes mellitus without complication   . Asthma     44yrs ago  . Migraines     none in last 11 yrs  . Anemia   . Post partum depression   . Dysmenorrhea   . Urinary incontinence   . Pelvic kidney     left    Past Surgical History  Procedure Laterality Date  . Cesarean section    . Tympanostomy tube placement      Family History  Problem Relation Age of Onset  . Diabetes Maternal Grandmother   . Heart failure Maternal Grandmother   . Heart disease Maternal Grandmother   . Hyperlipidemia Maternal Grandmother   . Hypertension Maternal Grandmother   . Hypertension Maternal Grandfather   . Hyperlipidemia Maternal Grandfather   . Hypertension Mother   . Arthritis Mother   . Other Sister     blood clot  . Cancer Sister   . Hypertension Sister   . Breast cancer Paternal Aunt   .  Cancer Paternal Grandfather    Social History:  reports that she has never smoked. She has never used smokeless tobacco. She reports that she does not drink alcohol or use illicit drugs.  Allergies:  Allergies  Allergen Reactions  . Metformin And Related Diarrhea     (Not in a hospital admission)  No results found for this or any previous visit (from the past 48 hour(s)). No results found.  Review of Systems  Constitutional: Negative.   HENT: Negative.   Eyes: Negative.   Respiratory: Negative.   Cardiovascular: Negative.   Gastrointestinal: Negative.   Genitourinary: Negative.   Musculoskeletal: Positive for joint pain.  Skin: Negative.   Neurological: Negative.   Psychiatric/Behavioral: Negative.     Last menstrual period 02/27/2014. Physical Exam  Constitutional: She is oriented to person, place, and time. She appears well-developed and well-nourished.  HENT:  Head: Normocephalic and atraumatic.  Eyes: Conjunctivae and EOM are normal. Pupils are equal, round, and reactive to light.  Neck: Normal range of motion. Neck supple.  Cardiovascular: Normal rate and regular rhythm.   Respiratory: Effort normal and breath sounds normal.  GI: Soft. Bowel sounds are normal.  Musculoskeletal:  Tender in the medial joint line. Patellofemoral pain with compression. Trace effusion. McMurray's is positive.   Neurological: She is alert and oriented to person, place, and time. She has normal  reflexes.  Skin: Skin is warm and dry.  Psychiatric: She has a normal mood and affect.    MRI demonstrates near complete tear at the posterior medial meniscus. Cleavage tear at the posterior horn. Chondromalacia patella. Moderate effusion.  Assessment/Plan Left knee MMT Symptomatic medial meniscal tear of posterior horn. Locking, popping, and giving away. 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  We discussed options to proceed with knee arthroscopy, quad  strengthening. In the interim protective activity. Discussed the possibility specifically of residual symptoms related to her arthritis requiring additional treatment.  I had a long discussion with the patient concerning the risks and benefits of knee arthroscopy including help from the arthroscopic procedure as well as no help from the arthroscopic procedure or worsening of symptoms. Also discussed infection, DVT, PE, anesthetic complications, etc. Also discussed the possibility of repeat arthroscopic surgery required in the future or total knee replacement. I provided the patient with an illustrated handout and discussed it in detail as well as discussed the postoperative and perioperative courses and return to functional activities including work. Need for postoperative DVT prophylaxis was discussed as well.  Plan left knee arthroscopy, partial medial meniscectomy, debridement  BISSELL, JACLYN M. for Dr. Tonita Cong 04/09/2014, 12:46 PM

## 2014-04-10 ENCOUNTER — Encounter (HOSPITAL_COMMUNITY): Payer: Self-pay | Admitting: Pharmacy Technician

## 2014-04-11 ENCOUNTER — Encounter (HOSPITAL_COMMUNITY)
Admission: RE | Admit: 2014-04-11 | Discharge: 2014-04-11 | Disposition: A | Discharge status: Home / Self Care | Payer: 59 | Source: Ambulatory Visit | Attending: Specialist | Admitting: Specialist

## 2014-04-11 ENCOUNTER — Encounter (HOSPITAL_COMMUNITY): Payer: Self-pay

## 2014-04-11 DIAGNOSIS — Z01818 Encounter for other preprocedural examination: Secondary | ICD-10-CM | POA: Insufficient documentation

## 2014-04-11 DIAGNOSIS — M23305 Other meniscus derangements, unspecified medial meniscus, unspecified knee: Secondary | ICD-10-CM | POA: Diagnosis present

## 2014-04-11 HISTORY — DX: Unspecified hearing loss, bilateral: H91.93

## 2014-04-11 LAB — CBC
HEMATOCRIT: 38.7 % (ref 36.0–46.0)
HEMOGLOBIN: 12.4 g/dL (ref 12.0–15.0)
MCH: 24.8 pg — AB (ref 26.0–34.0)
MCHC: 32 g/dL (ref 30.0–36.0)
MCV: 77.2 fL — AB (ref 78.0–100.0)
Platelets: 257 10*3/uL (ref 150–400)
RBC: 5.01 MIL/uL (ref 3.87–5.11)
RDW: 16.6 % — ABNORMAL HIGH (ref 11.5–15.5)
WBC: 7.6 10*3/uL (ref 4.0–10.5)

## 2014-04-11 LAB — BASIC METABOLIC PANEL
Anion gap: 13 (ref 5–15)
BUN: 13 mg/dL (ref 6–23)
CHLORIDE: 99 meq/L (ref 96–112)
CO2: 27 meq/L (ref 19–32)
CREATININE: 0.93 mg/dL (ref 0.50–1.10)
Calcium: 10.6 mg/dL — ABNORMAL HIGH (ref 8.4–10.5)
GFR calc Af Amer: 80 mL/min — ABNORMAL LOW (ref >90)
GFR calc non Af Amer: 69 mL/min — ABNORMAL LOW (ref >90)
GLUCOSE: 212 mg/dL — AB (ref 70–99)
Potassium: 4 mEq/L (ref 3.7–5.3)
Sodium: 139 mEq/L (ref 137–147)

## 2014-04-11 LAB — HCG, SERUM, QUALITATIVE: Preg, Serum: NEGATIVE

## 2014-04-11 NOTE — Patient Instructions (Signed)
20     Your procedure is scheduled on:  Thursday 04/17/2014  Report to [facility] Main Entrance and follow signs to Short Stay  at 0830 AM.  Call this number if you have problems the night before or morning of surgery: [phone]   Remember:DO NOT TAKE ANY DIABETIC MEDICATIONS MORNING OF SURGERY!          Do not eat food or drink liquids AFTER MIDNIGHT!  Take these medicines the morning of surgery with A SIP OF WATER: NONE    [facility] IS NOT RESPONSIBLE FOR ANY BELONGINGS OR VALUABLES BROUGHT TO HOSPITAL.  Marland Kitchen  Leave suitcase in the car. After surgery it may be brought to your room.  For patients admitted to the hospital, checkout time is 11:00 AM the day of              Discharge.    DO NOT WEAR  JEWELRY,MAKE-UP,LOTIONS,POWDERS,PERFUMES,CONTACTS , DENTURES OR BRIDGEWORK ,AND DO NOT WEAR FALSE EYELASHES                                    Patients discharged the day of surgery will not be allowed to drive home.   If going home the same day of surgery, must have someone stay with you   first 24 hrs.at home and arrange for someone to drive you home from the [place] sister- [place]   Special Instructions:              Please read over the following fact sheets that you were given:             1. [facility] - Preparing for Surgery Before surgery, you can play an important role.  Because skin is not sterile, your skin needs to be as free of germs as possible.  You can reduce the number of germs on your skin by washing with CHG (chlorahexidine gluconate) soap before surgery.  CHG is an antiseptic cleaner which kills germs and bonds with the skin to continue killing germs even after washing. Please DO NOT use if you have an allergy to CHG or antibacterial soaps.  If your skin becomes reddened/irritated  stop using the CHG and inform your nurse when you arrive at Short Stay. Do not shave (including legs and underarms) for at least 48 hours prior to the first CHG shower.  You may shave your face/neck. Please follow these instructions carefully:  1.  Shower with CHG Soap the night before surgery and the  morning of Surgery.  2.  If you choose to wash your hair, wash your hair first as usual with your  normal  shampoo.  3.  After you shampoo, rinse your hair and body thoroughly to remove the  shampoo.  4.  Use CHG as you would any other liquid soap.  You can apply chg directly  to the skin and wash                       Gently with a scrungie or clean washcloth.  5.  Apply the CHG Soap to your body ONLY FROM THE NECK DOWN.   Do not use on face/ open                           Wound or open sores. Avoid contact with eyes, ears mouth and genitals (private parts).                       Wash face,  Genitals (private parts) with your normal soap.             6.  Wash thoroughly, paying special attention to the area where your surgery  will be performed.  7.  Thoroughly rinse your body with warm water from the neck down.  8.  DO NOT shower/wash with your normal soap after using and rinsing off  the CHG Soap.                9.  Pat yourself dry with a clean towel.            10.  Wear clean pajamas.            11.  Place clean sheets on your bed the night of your first shower and do not  sleep with pets. Day of Surgery : Do not apply any lotions/deodorants the morning of surgery.  Please wear clean clothes to the hospital/surgery center.  FAILURE TO FOLLOW THESE INSTRUCTIONS MAY RESULT IN THE CANCELLATION OF YOUR SURGERY PATIENT SIGNATURE_________________________________  NURSE SIGNATURE__________________________________  ________________________________________________________________________   Adam Phenix  An incentive spirometer is a tool that can help keep your  lungs clear and active. This tool measures how well you are filling your lungs with each breath. Taking long deep breaths may help reverse or decrease the chance of developing breathing (pulmonary) problems (especially infection) following:  A long period of time when you are unable to move or be active. BEFORE THE PROCEDURE   If the spirometer includes an indicator to show your best effort, your nurse or respiratory therapist will set it to a desired goal.  If possible, sit up straight or lean slightly forward. Try not to slouch.  Hold the incentive spirometer in an upright position. INSTRUCTIONS FOR USE  1. Sit on the edge of your bed if possible, or sit up as far as you can in bed or on a chair. 2. Hold the incentive spirometer in an upright position. 3. Breathe out normally. 4. Place the mouthpiece in your mouth and seal your lips tightly around it. 5. Breathe in slowly and as deeply as possible, raising the piston or the ball toward the top of the column. 6. Hold your breath for 3-5 seconds or for as long as possible. Allow the piston or ball to fall to the bottom of the column. 7. Remove the mouthpiece from your mouth and breathe out normally. 8. Rest for a few seconds and repeat Steps 1 through 7 at least 10 times every 1-2 hours when you are awake. Take your time and take a few normal breaths between deep breaths. 9. The spirometer may include an indicator to show  your best effort. Use the indicator as a goal to work toward during each repetition. 10. After each set of 10 deep breaths, practice coughing to be sure your lungs are clear. If you have an incision (the cut made at the time of surgery), support your incision when coughing by placing a pillow or rolled up towels firmly against it. Once you are able to get out of bed, walk around indoors and cough well. You may stop using the incentive spirometer when instructed by your caregiver.  RISKS AND COMPLICATIONS  Take your time so  you do not get dizzy or light-headed.  If you are in pain, you may need to take or ask for pain medication before doing incentive spirometry. It is harder to take a deep breath if you are having pain. AFTER USE  Rest and breathe slowly and easily.  It can be helpful to keep track of a log of your progress. Your caregiver can provide you with a simple table to help with this. If you are using the spirometer at home, follow these instructions: [place] IF:   You are having difficultly using the spirometer.  You have trouble using the spirometer as often as instructed.  Your pain medication is not giving enough relief while using the spirometer.  You develop fever of 100.5 F (38.1 C) or higher. SEEK IMMEDIATE MEDICAL CARE IF:   You cough up bloody sputum that had not been present before.  You develop fever of 102 F (38.9 C) or greater.  You develop worsening pain at or near the incision site. MAKE SURE YOU:   Understand these instructions.  Will watch your condition.  Will get help right away if you are not doing well or get worse. Document Released: 12/05/2006 Document Revised: 10/17/2011 Document Reviewed: 02/05/2007 [facility] Patient Information 2014 [place], [place].   ________________________________________________________________________

## 2014-04-11 NOTE — Progress Notes (Signed)
04/11/14 1441  OBSTRUCTIVE SLEEP APNEA  Have you ever been diagnosed with sleep apnea through a sleep study? No  Do you snore loudly (loud enough to be heard through closed doors)?  1  Do you often feel tired, fatigued, or sleepy during the daytime? 1  Has anyone observed you stop breathing during your sleep? 0  Do you have, or are you being treated for high blood pressure? 0  BMI more than 35 kg/m2? 1  Age over 52 years old? 1  Neck circumference greater than 40 cm/16 inches? 1  Gender: 0  Obstructive Sleep Apnea Score 5  Score 4 or greater  Results sent to PCP

## 2014-04-17 ENCOUNTER — Ambulatory Visit (HOSPITAL_COMMUNITY): Payer: 59 | Admitting: Registered Nurse

## 2014-04-17 ENCOUNTER — Encounter (HOSPITAL_COMMUNITY): Payer: 59 | Admitting: Registered Nurse

## 2014-04-17 ENCOUNTER — Ambulatory Visit (HOSPITAL_COMMUNITY)
Admission: RE | Admit: 2014-04-17 | Discharge: 2014-04-17 | Disposition: A | Discharge status: Home / Self Care | Payer: 59 | Source: Ambulatory Visit | Attending: Specialist | Admitting: Specialist

## 2014-04-17 ENCOUNTER — Encounter (HOSPITAL_COMMUNITY)
Admission: RE | Disposition: A | Discharge status: Home / Self Care | Payer: Self-pay | Source: Ambulatory Visit | Attending: Specialist

## 2014-04-17 ENCOUNTER — Encounter (HOSPITAL_COMMUNITY): Payer: Self-pay | Admitting: *Deleted

## 2014-04-17 DIAGNOSIS — J45909 Unspecified asthma, uncomplicated: Secondary | ICD-10-CM | POA: Diagnosis not present

## 2014-04-17 DIAGNOSIS — E119 Type 2 diabetes mellitus without complications: Secondary | ICD-10-CM | POA: Insufficient documentation

## 2014-04-17 DIAGNOSIS — S83207A Unspecified tear of unspecified meniscus, current injury, left knee, initial encounter: Secondary | ICD-10-CM

## 2014-04-17 DIAGNOSIS — Z9109 Other allergy status, other than to drugs and biological substances: Secondary | ICD-10-CM | POA: Insufficient documentation

## 2014-04-17 DIAGNOSIS — R32 Unspecified urinary incontinence: Secondary | ICD-10-CM | POA: Diagnosis not present

## 2014-04-17 DIAGNOSIS — IMO0002 Reserved for concepts with insufficient information to code with codable children: Secondary | ICD-10-CM | POA: Insufficient documentation

## 2014-04-17 DIAGNOSIS — N946 Dysmenorrhea, unspecified: Secondary | ICD-10-CM | POA: Insufficient documentation

## 2014-04-17 DIAGNOSIS — Z6841 Body Mass Index (BMI) 40.0 and over, adult: Secondary | ICD-10-CM | POA: Diagnosis not present

## 2014-04-17 DIAGNOSIS — X58XXXA Exposure to other specified factors, initial encounter: Secondary | ICD-10-CM | POA: Diagnosis not present

## 2014-04-17 DIAGNOSIS — M2240 Chondromalacia patellae, unspecified knee: Secondary | ICD-10-CM | POA: Insufficient documentation

## 2014-04-17 DIAGNOSIS — Z888 Allergy status to other drugs, medicaments and biological substances status: Secondary | ICD-10-CM | POA: Diagnosis not present

## 2014-04-17 DIAGNOSIS — Z887 Allergy status to serum and vaccine status: Secondary | ICD-10-CM | POA: Diagnosis not present

## 2014-04-17 HISTORY — PX: KNEE ARTHROSCOPY WITH MEDIAL MENISECTOMY: SHX5651

## 2014-04-17 LAB — GLUCOSE, CAPILLARY
GLUCOSE-CAPILLARY: 174 mg/dL — AB (ref 70–99)
Glucose-Capillary: 181 mg/dL — ABNORMAL HIGH (ref 70–99)

## 2014-04-17 SURGERY — ARTHROSCOPY, KNEE, WITH MEDIAL MENISCECTOMY
Anesthesia: General | Site: Knee | Laterality: Left

## 2014-04-17 MED ORDER — ROCURONIUM BROMIDE 100 MG/10ML IV SOLN
INTRAVENOUS | Status: DC | PRN
  Administered 2014-04-17: 10 mg via INTRAVENOUS

## 2014-04-17 MED ORDER — DEXTROSE 5 % IV SOLN
3.0000 g | INTRAVENOUS | Status: AC
  Administered 2014-04-17: 3 g via INTRAVENOUS
  Filled 2014-04-17: qty 3000

## 2014-04-17 MED ORDER — PROPOFOL 10 MG/ML IV BOLUS
INTRAVENOUS | Status: DC | PRN
  Administered 2014-04-17: 250 mg via INTRAVENOUS
  Administered 2014-04-17: 150 mg via INTRAVENOUS

## 2014-04-17 MED ORDER — LOSARTAN POTASSIUM 25 MG PO TABS
25.0000 mg | ORAL_TABLET | Freq: Every day | ORAL | Status: DC
  Administered 2014-04-17: 25 mg via ORAL
  Filled 2014-04-17 (×2): qty 1

## 2014-04-17 MED ORDER — FENTANYL CITRATE 0.05 MG/ML IJ SOLN
INTRAMUSCULAR | Status: DC | PRN
  Administered 2014-04-17: 50 ug via INTRAVENOUS
  Administered 2014-04-17 (×4): 25 ug via INTRAVENOUS

## 2014-04-17 MED ORDER — ACETAMINOPHEN 10 MG/ML IV SOLN
1000.0000 mg | Freq: Once | INTRAVENOUS | Status: AC
Start: 2014-04-17 — End: 2014-04-17
  Administered 2014-04-17: 1000 mg via INTRAVENOUS
  Filled 2014-04-17: qty 100

## 2014-04-17 MED ORDER — ONDANSETRON HCL 4 MG/2ML IJ SOLN
INTRAMUSCULAR | Status: AC
  Filled 2014-04-17: qty 2

## 2014-04-17 MED ORDER — ALBUTEROL SULFATE (2.5 MG/3ML) 0.083% IN NEBU
2.5000 mg | INHALATION_SOLUTION | Freq: Four times a day (QID) | RESPIRATORY_TRACT | Status: DC | PRN
  Administered 2014-04-17: 2.5 mg via RESPIRATORY_TRACT

## 2014-04-17 MED ORDER — SCOPOLAMINE 1 MG/3DAYS TD PT72
1.0000 | MEDICATED_PATCH | TRANSDERMAL | Status: DC
  Administered 2014-04-17: 1.5 mg via TRANSDERMAL
  Filled 2014-04-17: qty 1

## 2014-04-17 MED ORDER — SUCCINYLCHOLINE CHLORIDE 20 MG/ML IJ SOLN
INTRAMUSCULAR | Status: DC | PRN
  Administered 2014-04-17: 100 mg via INTRAVENOUS

## 2014-04-17 MED ORDER — HYDROMORPHONE HCL PF 1 MG/ML IJ SOLN
INTRAMUSCULAR | Status: AC
  Filled 2014-04-17: qty 1

## 2014-04-17 MED ORDER — EPINEPHRINE HCL 1 MG/ML IJ SOLN
INTRAMUSCULAR | Status: AC
  Filled 2014-04-17: qty 1

## 2014-04-17 MED ORDER — PROPOFOL 10 MG/ML IV BOLUS
INTRAVENOUS | Status: AC
  Filled 2014-04-17: qty 20

## 2014-04-17 MED ORDER — BUPIVACAINE-EPINEPHRINE (PF) 0.25% -1:200000 IJ SOLN
INTRAMUSCULAR | Status: AC
  Filled 2014-04-17: qty 30

## 2014-04-17 MED ORDER — MIDAZOLAM HCL 5 MG/5ML IJ SOLN
INTRAMUSCULAR | Status: DC | PRN
  Administered 2014-04-17 (×2): 1 mg via INTRAVENOUS

## 2014-04-17 MED ORDER — KETOROLAC TROMETHAMINE 10 MG PO TABS: 10.0000 mg | ORAL_TABLET | Freq: Four times a day (QID) | ORAL | Status: DC | PRN

## 2014-04-17 MED ORDER — SCOPOLAMINE 1 MG/3DAYS TD PT72
MEDICATED_PATCH | TRANSDERMAL | Status: AC
  Filled 2014-04-17: qty 1

## 2014-04-17 MED ORDER — EPINEPHRINE HCL 1 MG/ML IJ SOLN
INTRAMUSCULAR | Status: DC | PRN
  Administered 2014-04-17: 1 mL

## 2014-04-17 MED ORDER — KETOROLAC TROMETHAMINE 30 MG/ML IJ SOLN
INTRAMUSCULAR | Status: DC | PRN
  Administered 2014-04-17: 30 mg via INTRAVENOUS

## 2014-04-17 MED ORDER — LACTATED RINGERS IV SOLN
INTRAVENOUS | Status: DC
  Administered 2014-04-17 (×2): 1000 mL via INTRAVENOUS

## 2014-04-17 MED ORDER — LACTATED RINGERS IR SOLN
Status: DC | PRN
  Administered 2014-04-17: 6000 mL

## 2014-04-17 MED ORDER — NEOSTIGMINE METHYLSULFATE 10 MG/10ML IV SOLN
INTRAVENOUS | Status: DC | PRN
  Administered 2014-04-17: 3 mg via INTRAVENOUS

## 2014-04-17 MED ORDER — MIDAZOLAM HCL 2 MG/2ML IJ SOLN
INTRAMUSCULAR | Status: AC
  Filled 2014-04-17: qty 2

## 2014-04-17 MED ORDER — GLYCOPYRROLATE 0.2 MG/ML IJ SOLN
INTRAMUSCULAR | Status: AC
  Filled 2014-04-17: qty 2

## 2014-04-17 MED ORDER — FENTANYL CITRATE 0.05 MG/ML IJ SOLN
INTRAMUSCULAR | Status: AC
  Filled 2014-04-17: qty 5

## 2014-04-17 MED ORDER — ALBUTEROL SULFATE (2.5 MG/3ML) 0.083% IN NEBU
INHALATION_SOLUTION | RESPIRATORY_TRACT | Status: DC
Start: 2014-04-17 — End: 2014-04-17
  Filled 2014-04-17: qty 3

## 2014-04-17 MED ORDER — OXYCODONE-ACETAMINOPHEN 7.5-325 MG PO TABS: 1.0000 | ORAL_TABLET | ORAL | Status: DC | PRN

## 2014-04-17 MED ORDER — GLYCOPYRROLATE 0.2 MG/ML IJ SOLN
INTRAMUSCULAR | Status: DC | PRN
  Administered 2014-04-17: 0.2 mg via INTRAVENOUS

## 2014-04-17 MED ORDER — LACTATED RINGERS IV SOLN: INTRAVENOUS | Status: DC

## 2014-04-17 MED ORDER — BUPIVACAINE-EPINEPHRINE (PF) 0.25% -1:200000 IJ SOLN
INTRAMUSCULAR | Status: DC | PRN
  Administered 2014-04-17: 16 mL

## 2014-04-17 MED ORDER — DOCUSATE SODIUM 100 MG PO CAPS: 100.0000 mg | ORAL_CAPSULE | Freq: Two times a day (BID) | ORAL | Status: DC | PRN

## 2014-04-17 MED ORDER — LIDOCAINE HCL (CARDIAC) 20 MG/ML IV SOLN
INTRAVENOUS | Status: DC | PRN
  Administered 2014-04-17: 100 mg via INTRAVENOUS

## 2014-04-17 MED ORDER — FENTANYL CITRATE 0.05 MG/ML IJ SOLN
INTRAMUSCULAR | Status: AC
  Filled 2014-04-17: qty 2

## 2014-04-17 MED ORDER — LIDOCAINE HCL (CARDIAC) 20 MG/ML IV SOLN
INTRAVENOUS | Status: AC
  Filled 2014-04-17: qty 5

## 2014-04-17 MED ORDER — KETOROLAC TROMETHAMINE 30 MG/ML IJ SOLN
INTRAMUSCULAR | Status: AC
  Filled 2014-04-17: qty 1

## 2014-04-17 MED ORDER — FENTANYL CITRATE 0.05 MG/ML IJ SOLN
50.0000 ug | INTRAMUSCULAR | Status: DC | PRN
  Administered 2014-04-17 (×2): 50 ug via INTRAVENOUS

## 2014-04-17 MED ORDER — DEXAMETHASONE SODIUM PHOSPHATE 10 MG/ML IJ SOLN
INTRAMUSCULAR | Status: DC | PRN
  Administered 2014-04-17: 10 mg via INTRAVENOUS

## 2014-04-17 MED ORDER — HYDROMORPHONE HCL PF 1 MG/ML IJ SOLN
0.2500 mg | INTRAMUSCULAR | Status: DC | PRN
  Administered 2014-04-17: 0.5 mg via INTRAVENOUS

## 2014-04-17 SURGICAL SUPPLY — 20 items
BLADE CUDA SHAVER 3.5 (BLADE) ×2 IMPLANT
BNDG CMPR MED 10X6 ELC LF (GAUZE/BANDAGES/DRESSINGS) ×1
BNDG ELASTIC 6X10 VLCR STRL LF (GAUZE/BANDAGES/DRESSINGS) ×1 IMPLANT
CLOTH 2% CHLOROHEXIDINE 3PK (PERSONAL CARE ITEMS) ×2 IMPLANT
DRSG EMULSION OIL 3X3 NADH (GAUZE/BANDAGES/DRESSINGS) ×2 IMPLANT
DRSG PAD ABDOMINAL 8X10 ST (GAUZE/BANDAGES/DRESSINGS) ×1 IMPLANT
DURAPREP 26ML APPLICATOR (WOUND CARE) ×2 IMPLANT
GAUZE SPONGE 4X4 12PLY STRL (GAUZE/BANDAGES/DRESSINGS) ×1 IMPLANT
GLOVE BIO SURGEON STRL SZ8 (GLOVE) ×1 IMPLANT
GLOVE BIOGEL PI IND STRL 8.5 (GLOVE) IMPLANT
GLOVE BIOGEL PI INDICATOR 8.5 (GLOVE) ×1
GLOVE SURG SS PI 8.0 STRL IVOR (GLOVE) ×3 IMPLANT
GOWN STRL REUS W/TWL XL LVL3 (GOWN DISPOSABLE) ×3 IMPLANT
MANIFOLD NEPTUNE II (INSTRUMENTS) ×2 IMPLANT
PACK ARTHROSCOPY WL (CUSTOM PROCEDURE TRAY) ×2 IMPLANT
PADDING CAST COTTON 6X4 STRL (CAST SUPPLIES) ×2 IMPLANT
SET ARTHROSCOPY TUBING (MISCELLANEOUS) ×2
SET ARTHROSCOPY TUBING LN (MISCELLANEOUS) ×1 IMPLANT
SUT ETHILON 4 0 PS 2 18 (SUTURE) ×2 IMPLANT
WRAP KNEE MAXI GEL POST OP (GAUZE/BANDAGES/DRESSINGS) ×2 IMPLANT

## 2014-04-17 NOTE — Interval H&P Note (Signed)
History and Physical Interval Note:  04/17/2014 7:37 AM  Jill [name]  has presented today for surgery, with the diagnosis of left knee medial meniscus tear  The various methods of treatment have been discussed with the patient and family. After consideration of risks, benefits and other options for treatment, the patient has consented to  Procedure(s): LEFT KNEE ARTHROSCOPY WITH PARTIAL MEDIAL MENISECTOMY AND DEBRIDEMENT (Left) as a surgical intervention .  The patient's history has been reviewed, patient examined, no change in status, stable for surgery.  I have reviewed the patient's chart and labs.  Questions were answered to the patient's satisfaction.     Drena Ham C

## 2014-04-17 NOTE — Transfer of Care (Signed)
Immediate Anesthesia Transfer of Care Note  Patient: Jill [name]  Procedure(s) Performed: Procedure(s): LEFT KNEE ARTHROSCOPY WITH PARTIAL MEDIAL MENISCECTOMY AND CHONDROPLASTY (Left)  Patient Location: PACU  Anesthesia Type:General  Level of Consciousness: awake, alert , oriented and patient cooperative  Airway & Oxygen Therapy: Patient Spontanous Breathing and Patient connected to face mask oxygen  Post-op Assessment: Report given to PACU RN, Post -op Vital signs reviewed and stable and Patient moving all extremities  Post vital signs: Reviewed and stable  Complications: No apparent anesthesia complications

## 2014-04-17 NOTE — Progress Notes (Signed)
Unable to wean pt off oxygen.  Pt is improved after albuterol treatment and actively using incentive spiromtere, but still not able to wean to room air.  Discussed with pt who reported she  has not taken COZAAR in a few days.  Dr Landry Dyke notifed and Cozaar ordered.

## 2014-04-17 NOTE — Anesthesia Preprocedure Evaluation (Addendum)
Anesthesia Evaluation  Patient identified by MRN, date of birth, ID band Patient awake    Reviewed: Allergy & Precautions, H&P , NPO status , Patient's Chart, lab work & pertinent test results  History of Anesthesia Complications (+) PONVNegative for: history of anesthetic complications  Airway Mallampati: III TM Distance: >3 FB Neck ROM: full    Dental no notable dental hx. (+) Teeth Intact, Dental Advisory Given   Pulmonary shortness of breath and with exertion, asthma , sleep apnea ,  breath sounds clear to auscultation  Pulmonary exam normal       Cardiovascular Exercise Tolerance: Good negative cardio ROS  Rhythm:regular Rate:Normal     Neuro/Psych negative neurological ROS  negative psych ROS   GI/Hepatic negative GI ROS, Neg liver ROS,   Endo/Other  diabetes, Well Controlled, Type 2, Oral Hypoglycemic AgentsMorbid obesity  Renal/GU negative Renal ROS  negative genitourinary   Musculoskeletal   Abdominal (+) + obese,   Peds  Hematology negative hematology ROS (+)   Anesthesia Other Findings   Reproductive/Obstetrics negative OB ROS                        Anesthesia Physical Anesthesia Plan  ASA: III  Anesthesia Plan: General   Post-op Pain Management:    Induction: Intravenous  Airway Management Planned: LMA  Additional Equipment:   Intra-op Plan:   Post-operative Plan:   Informed Consent: I have reviewed the patients History and Physical, chart, labs and discussed the procedure including the risks, benefits and alternatives for the proposed anesthesia with the patient or authorized representative who has indicated his/her understanding and acceptance.   Dental Advisory Given  Plan Discussed with: CRNA and Surgeon  Anesthesia Plan Comments:         Anesthesia Quick Evaluation

## 2014-04-17 NOTE — Discharge Instructions (Signed)
ARTHROSCOPIC KNEE SURGERY HOME CARE INSTRUCTIONS ° ° °PAIN °You will be expected to have a moderate amount of pain in the affected knee for approximately two weeks.  However, the first two to four days will be the most severe in terms of the pain you will experience.  Prescriptions have been provided for you to take as needed for the pain.  The pain can be markedly reduced by using the ice/compressive bandage given.  Exchange the ice packs whenever they thaw.  During the night, keep the bandage on because it will still provide some compression for the swelling.  Also, keep the leg elevated on pillows above your heart, and this will help alleviate the pain and swelling. ° °MEDICATION °Prescriptions have been provided to take as needed for pain. To prevent blood clots, take Aspirin 325mg daily with a meal if not on a blood thinner and if no history of stomach ulcers. ° °ACTIVITY °It is preferred that you stay on bedrest for approximately 24 hours.  However, you may go to the bathroom with help.  After this, you can start to be up and about progressively more.  Remember that the swelling may still increase after three to four days if you are up and doing too much.  You may put as much weight on the affected leg as pain will allow.  Use your crutches for comfort and safety.  However, as soon as you are able, you may discard the crutches and go without them.  ° °DRESSING °Keep the current dressing as dry as possible.  Two days after your surgery, you may remove the ice/compressive wrap, and surgical dressing.  You may now take a shower, but do not scrub the sounds directly with soap.  Let water rinse over these and gently wipe with your hand.  Reapply band-aids over the puncture wounds and more gauze if needed.  A slight amount of thin drainage can be normal at this time, and do not let it frighten you.  Reapply the ice/compressive wrap.  You may now repeat this every day each time you shower. ° °SYMPTOMS TO REPORT TO  YOUR DOCTOR ° -Extreme pain. ° -Extreme swelling. ° -Temperature above 101 degrees that does not come down with acetaminophen     (Tylenol). ° -Any changes in the feeling, color or movement of your toes. ° -Extreme redness, heat, swelling or drainage at your incision ° °EXERCISE °It is preferred that you begin to exercise on the day of your surgery.  Straight leg raises and short arc quads should be begun the afternoon or evening of surgery and continued until you come back for your follow-up appointment.   Attached is an instruction sheet on how to perform these two simple exercises.  Do these at least three times per day if not more.  You may bend your knee as much as is comfortable.  The puncture wounds may occasionally be slightly uncomfortable with bending of the knee.  Do not let this frighten you.  It is important to keep your knee motion, but do not overdo it.  If you have significant pain, simply do not bend the knee as far.   You will be given more exercises to perform at your first return visit.   ° °RETURN APPOINTMENT °Please make an appointment to be seen by your doctor in 14 days from your surgery. ° °Patient Signature:  ________________________________________________________ ° °Nurse's Signature:  ________________________________________________________ °

## 2014-04-17 NOTE — H&P (View-Only) (Signed)
Jill [name] is an 53 y.o. female.   Chief Complaint: L knee pain HPI: The patient is a 53 year old female who presents for a follow-up for follow-up knee. The patient is being followed for their left knee pain. They are now 2 weeks, 5 days out from when symptoms began. Symptoms reported today include: pain (better), swelling (improved), aching and instability, while the patient does not report symptoms of: giving way. The patient feels that they are doing poorly and report their pain level to be moderate and 6 / 10. Current treatment includes: activity modification and NSAIDs. The following medication has been used for pain control: ibuprofen. The patient presents today following MRI. The patient has not gotten any relief of their symptoms with activity modification. Note for "Follow-up Knee": The knee is feeling less unstable and less swollen but is still fairly painful and limiting her activity due to pain. She does have a knee sleeve which she is using prn for support. She is taking ibuprofen prn, using voltaren gel locally with some relief.  Past Medical History  Diagnosis Date  . Allergy   . Diabetes mellitus without complication   . Asthma     5yrs ago  . Migraines     none in last 11 yrs  . Anemia   . Post partum depression   . Dysmenorrhea   . Urinary incontinence   . Pelvic kidney     left    Past Surgical History  Procedure Laterality Date  . Cesarean section    . Tympanostomy tube placement      Family History  Problem Relation Age of Onset  . Diabetes Maternal Grandmother   . Heart failure Maternal Grandmother   . Heart disease Maternal Grandmother   . Hyperlipidemia Maternal Grandmother   . Hypertension Maternal Grandmother   . Hypertension Maternal Grandfather   . Hyperlipidemia Maternal Grandfather   . Hypertension Mother   . Arthritis Mother   . Other Sister     blood clot  . Cancer Sister   . Hypertension Sister   . Breast cancer Paternal Aunt   .  Cancer Paternal Grandfather    Social History:  reports that she has never smoked. She has never used smokeless tobacco. She reports that she does not drink alcohol or use illicit drugs.  Allergies:  Allergies  Allergen Reactions  . Metformin And Related Diarrhea     (Not in a hospital admission)  No results found for this or any previous visit (from the past 48 hour(s)). No results found.  Review of Systems  Constitutional: Negative.   HENT: Negative.   Eyes: Negative.   Respiratory: Negative.   Cardiovascular: Negative.   Gastrointestinal: Negative.   Genitourinary: Negative.   Musculoskeletal: Positive for joint pain.  Skin: Negative.   Neurological: Negative.   Psychiatric/Behavioral: Negative.     Last menstrual period 02/27/2014. Physical Exam  Constitutional: She is oriented to person, place, and time. She appears well-developed and well-nourished.  HENT:  Head: Normocephalic and atraumatic.  Eyes: Conjunctivae and EOM are normal. Pupils are equal, round, and reactive to light.  Neck: Normal range of motion. Neck supple.  Cardiovascular: Normal rate and regular rhythm.   Respiratory: Effort normal and breath sounds normal.  GI: Soft. Bowel sounds are normal.  Musculoskeletal:  Tender in the medial joint line. Patellofemoral pain with compression. Trace effusion. McMurray's is positive.   Neurological: She is alert and oriented to person, place, and time. She has normal  reflexes.  Skin: Skin is warm and dry.  Psychiatric: She has a normal mood and affect.    MRI demonstrates near complete tear at the posterior medial meniscus. Cleavage tear at the posterior horn. Chondromalacia patella. Moderate effusion.  Assessment/Plan Left knee MMT Symptomatic medial meniscal tear of posterior horn. Locking, popping, and giving away. 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  We discussed options to proceed with knee arthroscopy, quad  strengthening. In the interim protective activity. Discussed the possibility specifically of residual symptoms related to her arthritis requiring additional treatment.  I had a long discussion with the patient concerning the risks and benefits of knee arthroscopy including help from the arthroscopic procedure as well as no help from the arthroscopic procedure or worsening of symptoms. Also discussed infection, DVT, PE, anesthetic complications, etc. Also discussed the possibility of repeat arthroscopic surgery required in the future or total knee replacement. I provided the patient with an illustrated handout and discussed it in detail as well as discussed the postoperative and perioperative courses and return to functional activities including work. Need for postoperative DVT prophylaxis was discussed as well.  Plan left knee arthroscopy, partial medial meniscectomy, debridement  [name] M. for Dr. Tonita Cong 04/09/2014, 12:46 PM

## 2014-04-17 NOTE — Anesthesia Postprocedure Evaluation (Signed)
  Anesthesia Post-op Note  Patient: Jill [name]  Procedure(s) Performed: Procedure(s) (LRB): LEFT KNEE ARTHROSCOPY WITH PARTIAL MEDIAL MENISCECTOMY AND CHONDROPLASTY (Left)  Patient Location: PACU  Anesthesia Type: General  Level of Consciousness: awake and alert   Airway and Oxygen Therapy: Patient Spontanous Breathing  Post-op Pain: mild  Post-op Assessment: Post-op Vital signs reviewed, Patient's Cardiovascular Status Stable, Respiratory Function Stable, Patent Airway and No signs of Nausea or vomiting  Last Vitals:  Filed Vitals:   04/17/14 1330  BP: 152/87  Pulse: 88  Temp: 36.6 C  Resp: 13    Post-op Vital Signs: stable   Complications: No apparent anesthesia complications

## 2014-04-17 NOTE — Brief Op Note (Signed)
04/17/2014  12:10 PM  PATIENT:  Jill [name]  53 y.o. female  PRE-OPERATIVE DIAGNOSIS:  left knee medial meniscus tear  POST-OPERATIVE DIAGNOSIS:  left knee medial meniscus tear  PROCEDURE:  Procedure(s): LEFT KNEE ARTHROSCOPY WITH PARTIAL MEDIAL MENISCECTOMY AND CHONDROPLASTY (Left)  SURGEON:  Surgeon(s) and Role:    * Johnn Hai, MD - Primary  PHYSICIAN ASSISTANT:   ASSISTANTS: none   ANESTHESIA:   general  EBL:  Total I/O In: 1000 [I.V.:1000] Out: -   BLOOD ADMINISTERED:none  DRAINS: none   LOCAL MEDICATIONS USED:  MARCAINE     SPECIMEN:  No Specimen  DISPOSITION OF SPECIMEN:  N/A  COUNTS:  YES  TOURNIQUET:  * No tourniquets in log *  DICTATION: .Other Dictation: Dictation Number [id number]  PLAN OF CARE: Discharge to home after PACU  PATIENT DISPOSITION:  PACU - hemodynamically stable.   Delay start of Pharmacological VTE agent (>24hrs) due to surgical blood loss or risk of bleeding: no

## 2014-04-17 NOTE — Interval H&P Note (Signed)
History and Physical Interval Note:  04/17/2014 9:57 AM  Jill [name]  has presented today for surgery, with the diagnosis of left knee medial meniscus tear  The various methods of treatment have been discussed with the patient and family. After consideration of risks, benefits and other options for treatment, the patient has consented to  Procedure(s): LEFT KNEE ARTHROSCOPY WITH PARTIAL MEDIAL MENISECTOMY AND DEBRIDEMENT (Left) as a surgical intervention .  The patient's history has been reviewed, patient examined, no change in status, stable for surgery.  I have reviewed the patient's chart and labs.  Questions were answered to the patient's satisfaction.     Shaka Zech C

## 2014-04-18 ENCOUNTER — Encounter (HOSPITAL_COMMUNITY): Payer: Self-pay | Admitting: Specialist

## 2014-04-18 NOTE — Op Note (Signed)
NAMEBRIGHTYN, Jill [name]             ACCOUNT NO.:  [id number]  MEDICAL RECORD NO.:  [id number]  LOCATION:  [facility]                         FACILITY:  [facility]  PHYSICIAN:  Susa Day, M.D.    DATE OF BIRTH:  March 22, 1961  DATE OF PROCEDURE:  04/17/2014 DATE OF DISCHARGE:  04/17/2014                              OPERATIVE REPORT   PREOPERATIVE DIAGNOSIS:  Medial meniscus tear of the left knee.  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  PROCEDURE PERFORMED: 1. Left knee arthroscopy. 2. Partial medial meniscectomy. 3. Chondroplasty of medial femoral condyle and patella.  ANESTHESIA:  General.  ASSISTANT:  None.  Degree of difficulty was increased due to the patient's BMI which was 40, 120 kg.  HISTORY OF PRESENT ILLNESS:  A 53 year old locking popping giving way. MRI indicating medial meniscus tear.  Posterior horn root avulsion, had mechanical symptoms and pain indicated for arthroscopy, evaluation of meniscus and partial meniscectomy.  Risks and benefits were discussed including bleeding, infection, damage to neurovascular structures, DVT, PE, anesthetic complications, etc.  TECHNIQUE:  With the patient in supine position, after induction of adequate general anesthesia, 3 g Kefzol, left lower extremity was prepped and draped in usual sterile fashion.  I spent considerable time using the leg holder with the thigh extra padding, foam padding as well as the towels and yellow-foam padding __________ configurations.  Then the left lower extremity was prepped and draped in usual sterile fashion.  A lateral parapatellar portal was fashioned with a #11 blade. Ingress cannula atraumatically placed.  Irrigant was utilized to insufflate the joint at 65 mmHg.  There was a bloody clot within the joint itself.  We attempted to irrigate that copiously.  Some difficulty with visualization,  therefore made a  superior medial parapatellar portal with an #11 blade and Ingress cannula increasing our flow. __________ tight in the medial compartment and evaluating the posterior horn of the medial meniscus was also grade 3 flap tear of the medial femoral condyle.  I introduced the shaver after localization with an 18- gauge needle and fashioning the medial parapatellar portal with an #11 blade.  I shaved this to a stable base.  Again, difficulty obtaining the posterior medial corner.  I therefore had to go from an LMA anesthesia to general endotracheal.  Using  a small biting basket after I probed that posteriorly, there was no big instability noted within the meniscus itself, there was some tearing along the free edge.  I introduced a shaver and a basket and trimmed the anterior edge, approximately 20% of the posterior third was excised to a stable base.  Further contoured with 3.5 Cuda shaver, and again, we were unable to get to the full posterior medial corner, but the probe placed there with manipulation, we found that it was fairly stable and through flexion-extension was not subluxating into the joint, I therefore felt it was stable.  ACL was unremarkable.  Lateral compartment revealed normal femoral condyle, tibial plateau, and meniscus stable to probe palpation. Suprapatellar pouch revealed grade 3 changes of patella.  Light chondroplasty performed here with normal patellofemoral tracking. Revisit all compartments, no further  pathology amenable to arthroscopic intervention.  I therefore removed all instrumentation.  Portals were closed with 4-0 nylon simple sutures, 0.25% Marcaine with epinephrine was infiltrated in the joint.  Wound was dressed sterilely.  Awoken without difficulty and transported to the recovery room in satisfactory condition.  The patient tolerated the procedure well.  No complications.  No assistant.     Susa Day, M.D.     Jill [name]  D:  04/17/2014  T:   04/17/2014  Job:  [id number]

## 2014-04-23 ENCOUNTER — Ambulatory Visit: Payer: 59 | Admitting: *Deleted

## 2014-05-07 ENCOUNTER — Other Ambulatory Visit: Payer: Self-pay | Admitting: Family Medicine

## 2014-05-07 DIAGNOSIS — N6310 Unspecified lump in the right breast, unspecified quadrant: Secondary | ICD-10-CM

## 2014-05-09 ENCOUNTER — Other Ambulatory Visit: Payer: Self-pay | Admitting: Family Medicine

## 2014-05-09 ENCOUNTER — Other Ambulatory Visit: Payer: Self-pay

## 2014-05-09 DIAGNOSIS — N6310 Unspecified lump in the right breast, unspecified quadrant: Secondary | ICD-10-CM

## 2014-05-15 ENCOUNTER — Encounter (INDEPENDENT_AMBULATORY_CARE_PROVIDER_SITE_OTHER): Payer: Self-pay

## 2014-05-15 ENCOUNTER — Ambulatory Visit
Admission: RE | Admit: 2014-05-15 | Discharge: 2014-05-15 | Disposition: A | Discharge status: Home / Self Care | Payer: 59 | Source: Ambulatory Visit | Attending: Family Medicine | Admitting: Family Medicine

## 2014-05-15 DIAGNOSIS — N6310 Unspecified lump in the right breast, unspecified quadrant: Secondary | ICD-10-CM

## 2014-05-28 ENCOUNTER — Ambulatory Visit: Payer: 59 | Admitting: *Deleted

## 2014-05-30 ENCOUNTER — Telehealth: Payer: Self-pay | Admitting: Radiology

## 2014-05-30 NOTE — Telephone Encounter (Signed)
Called her left message for her to call me back, she has missed diabetes education appointment, I would like to urge her to reschedule this, it is helpful to go.

## 2014-06-09 ENCOUNTER — Encounter (HOSPITAL_COMMUNITY): Payer: Self-pay | Admitting: Specialist

## 2014-09-10 ENCOUNTER — Ambulatory Visit (INDEPENDENT_AMBULATORY_CARE_PROVIDER_SITE_OTHER): Payer: 59 | Admitting: Family Medicine

## 2014-09-10 VITALS — BP 134/76 | HR 77 | Temp 98.6°F | Resp 18 | Wt 273.0 lb

## 2014-09-10 DIAGNOSIS — E119 Type 2 diabetes mellitus without complications: Secondary | ICD-10-CM

## 2014-09-10 DIAGNOSIS — R000 Tachycardia, unspecified: Secondary | ICD-10-CM

## 2014-09-10 DIAGNOSIS — I10 Essential (primary) hypertension: Secondary | ICD-10-CM

## 2014-09-10 LAB — POCT GLYCOSYLATED HEMOGLOBIN (HGB A1C): Hemoglobin A1C: 8.1

## 2014-09-10 NOTE — Progress Notes (Signed)
Jill [name] - 54 y.o. female MRN [id number]  Date of birth: 29-Aug-1960  SUBJECTIVE: CC: Low BP and Tachycardia HPI: Patient was seen at audiology appointment for routine hearing check. Blood pressure is noted to be 70 systolic and her heart rate is reportedly 170. Encouraged her to come here for further evaluation. Denies any symptoms at this time or at the time of these measurements.  Specifically denies dizziness, lightheadedness, chest pain, shortness of breath, difficulty breathing, tachypalpitations. She has overall been feeling well the past couple of weeks with no reported fevers, chills or respiratory symptoms. She does report stopping all of her medications and supplements approximately 2 weeks ago due to a generalized fatigue and malaise that she was attributing to her medications. The generalized fatigue and malaise is been present for greater than 6 months and essentially unchanged.  ROS: She denies any changes in her functional status and reports overall doing well following the surgery in 2015. She has not been going up and down steps due to her knee being painful still but denies any undue dyspnea on exertion, lower extremity edema, orthopnea or PND.  HISTORY:  Past Medical, Surgical, Social, and Family History reviewed & updated per EMR.  Pertinent Historical Findings include:  reports that she has never smoked. She has never used smokeless tobacco. Known diabetes on metformin and glipizide however she has discontinued these medicines approximately 2 weeks ago. Prior evaluation for sleep apnea but has not followed up for repeat sleep study and does not use a CPAP No prior thyroid dysfunction in spite of abnormal TSH intermittently. Most recently was less than 1. No prior cardiac events. She does not smoke.  OBJECTIVE:  VS:   HT:    WT:273 lb (123.832 kg)  BMI:           BP:134/76 mmHg  HR:77bpm  TEMP:98.6 F (37 C)(Oral)  RESP:97 %  Orthostatic VS reviewed and  normal PHYSICAL EXAM:  Physical Exam  Constitutional: She is oriented to person, place, and time and well-developed, well-nourished, and in no distress. No distress.  HENT:  Head: Normocephalic and atraumatic.  Eyes: Right eye exhibits no discharge. Left eye exhibits no discharge. No scleral icterus.  Neck: No JVD present. No tracheal deviation present. No thyromegaly present.  Cardiovascular: Normal rate, regular rhythm and normal heart sounds.  Exam reveals no gallop and no friction rub.   No murmur heard. Pulmonary/Chest: Effort normal. No respiratory distress. She has no wheezes. She has no rales. She exhibits no tenderness.  Abdominal: Soft.  Musculoskeletal: She exhibits no edema.  Lymphadenopathy:    She has no cervical adenopathy.  Neurological: She is alert and oriented to person, place, and time.  Skin: Skin is warm and dry. No rash noted. She is not diaphoretic. No erythema. No pallor.  Psychiatric: Mood, memory, affect and judgment normal.    DATA OBTAINED DURING VISIT: EKG: 09/10/14  - multiple obtained. There were technical difficulties with a couple of the leads initially Rate & Rhythm:  90 bpm, sinus   Axis:   normal   Intervals:   normal   Other:  T-wave inversion in lead III, isolated  overall good R wave progression Normal EKG   Results for orders placed or performed in visit on 09/10/14  POCT glycosylated hemoglobin (Hb A1C)  Result Value Ref Range   Hemoglobin A1C 8.1    general labs pending including TSH, CBC, CMP and direct LDL.    ASSESSMENT: 1. Essential hypertension   2.  Tachycardia   3. OBESITY, MORBID   4. Type 2 diabetes mellitus without complication    PLAN: See problem based charting & AVS for additional documentation.  Overall clinical picture reassuring. Assume this is likely due to machine air given patient completely asymptomatic at the time of findings and normal reassuring findings today.  Discussed red flags that would want further  acute evaluation  Follow-up as scheduled with Barton Fanny, MD   The patient would likely benefit from looking further into prior diagnosis of sleep apnea that is untreated.  Will send letter with lab results  Patient should restart taking her currently prescribed medications. Patient Agreeable > Return for As scheduled for chronic disease management.

## 2014-09-10 NOTE — Patient Instructions (Signed)
Please restart your diabetes medication. A1c is slightly higher than previously. Follow-up with Barton Fanny, MD as scheduled  Hypertension Hypertension, commonly called high blood pressure, is when the force of blood pumping through your arteries is too strong. Your arteries are the blood vessels that carry blood from your heart throughout your body. A blood pressure reading consists of a higher number over a lower number, such as 110/72. The higher number (systolic) is the pressure inside your arteries when your heart pumps. The lower number (diastolic) is the pressure inside your arteries when your heart relaxes. Ideally you want your blood pressure below 120/80. Hypertension forces your heart to work harder to pump blood. Your arteries may become narrow or stiff. Having hypertension puts you at risk for heart disease, stroke, and other problems.  RISK FACTORS Some risk factors for high blood pressure are controllable. Others are not.  Risk factors you cannot control include:   Race. You may be at higher risk if you are African American.  Age. Risk increases with age.  Gender. Men are at higher risk than women before age 79 years. After age 1, women are at higher risk than men. Risk factors you can control include:  Not getting enough exercise or physical activity.  Being overweight.  Getting too much fat, sugar, calories, or salt in your diet.  Drinking too much alcohol. SIGNS AND SYMPTOMS Hypertension does not usually cause signs or symptoms. Extremely high blood pressure (hypertensive crisis) may cause headache, anxiety, shortness of breath, and nosebleed. DIAGNOSIS  To check if you have hypertension, your health care provider will measure your blood pressure while you are seated, with your arm held at the level of your heart. It should be measured at least twice using the same arm. Certain conditions can cause a difference in blood pressure between your right and left arms.  A blood pressure reading that is higher than normal on one occasion does not mean that you need treatment. If one blood pressure reading is high, ask your health care provider about having it checked again. TREATMENT  Treating high blood pressure includes making lifestyle changes and possibly taking medicine. Living a healthy lifestyle can help lower high blood pressure. You may need to change some of your habits. Lifestyle changes may include:  Following the DASH diet. This diet is high in fruits, vegetables, and whole grains. It is low in salt, red meat, and added sugars.  Getting at least 2 hours of brisk physical activity every week.  Losing weight if necessary.  Not smoking.  Limiting alcoholic beverages.  Learning ways to reduce stress. If lifestyle changes are not enough to get your blood pressure under control, your health care provider may prescribe medicine. You may need to take more than one. Work closely with your health care provider to understand the risks and benefits. HOME CARE INSTRUCTIONS  Have your blood pressure rechecked as directed by your health care provider.   Take medicines only as directed by your health care provider. Follow the directions carefully. Blood pressure medicines must be taken as prescribed. The medicine does not work as well when you skip doses. Skipping doses also puts you at risk for problems.   Do not smoke.   Monitor your blood pressure at home as directed by your health care provider. SEEK MEDICAL CARE IF:   You think you are having a reaction to medicines taken.  You have recurrent headaches or feel dizzy.  You have swelling in your ankles.  You have trouble with your vision. SEEK IMMEDIATE MEDICAL CARE IF:  You develop a severe headache or confusion.  You have unusual weakness, numbness, or feel faint.  You have severe chest or abdominal pain.  You vomit repeatedly.  You have trouble breathing. MAKE SURE YOU:    Understand these instructions.  Will watch your condition.  Will get help right away if you are not doing well or get worse. Document Released: 07/25/2005 Document Revised: 12/09/2013 Document Reviewed: 05/17/2013 [facility] Patient Information 2015 [place], [place]. This information is not intended to replace advice given to you by your health care provider. Make sure you discuss any questions you have with your health care provider.

## 2014-09-11 ENCOUNTER — Other Ambulatory Visit: Payer: Self-pay | Admitting: Family Medicine

## 2014-09-11 LAB — TSH: TSH: 0.972 u[IU]/mL (ref 0.350–4.500)

## 2014-09-11 LAB — CBC WITH DIFFERENTIAL/PLATELET
BASOS PCT: 0 % (ref 0–1)
Basophils Absolute: 0 10*3/uL (ref 0.0–0.1)
EOS ABS: 0.1 10*3/uL (ref 0.0–0.7)
EOS PCT: 1 % (ref 0–5)
HCT: 37.9 % (ref 36.0–46.0)
Hemoglobin: 12.8 g/dL (ref 12.0–15.0)
LYMPHS PCT: 32 % (ref 12–46)
Lymphs Abs: 2.1 10*3/uL (ref 0.7–4.0)
MCH: 25.9 pg — AB (ref 26.0–34.0)
MCHC: 33.8 g/dL (ref 30.0–36.0)
MCV: 76.6 fL — ABNORMAL LOW (ref 78.0–100.0)
MONOS PCT: 6 % (ref 3–12)
MPV: 9.8 fL (ref 8.6–12.4)
Monocytes Absolute: 0.4 10*3/uL (ref 0.1–1.0)
NEUTROS ABS: 4 10*3/uL (ref 1.7–7.7)
Neutrophils Relative %: 61 % (ref 43–77)
Platelets: 238 10*3/uL (ref 150–400)
RBC: 4.95 MIL/uL (ref 3.87–5.11)
RDW: 15.9 % — AB (ref 11.5–15.5)
WBC: 6.6 10*3/uL (ref 4.0–10.5)

## 2014-09-11 LAB — COMPLETE METABOLIC PANEL WITH GFR
ALK PHOS: 90 U/L (ref 39–117)
ALT: 21 U/L (ref 0–35)
AST: 12 U/L (ref 0–37)
Albumin: 3.9 g/dL (ref 3.5–5.2)
BILIRUBIN TOTAL: 0.3 mg/dL (ref 0.2–1.2)
BUN: 13 mg/dL (ref 6–23)
CALCIUM: 10.2 mg/dL (ref 8.4–10.5)
CO2: 27 mEq/L (ref 19–32)
CREATININE: 0.88 mg/dL (ref 0.50–1.10)
Chloride: 103 mEq/L (ref 96–112)
GFR, EST AFRICAN AMERICAN: 87 mL/min
GFR, Est Non African American: 76 mL/min
GLUCOSE: 146 mg/dL — AB (ref 70–99)
POTASSIUM: 4.2 meq/L (ref 3.5–5.3)
Sodium: 137 mEq/L (ref 135–145)
Total Protein: 7.5 g/dL (ref 6.0–8.3)

## 2014-09-11 LAB — LDL CHOLESTEROL, DIRECT: Direct LDL: 125 mg/dL — ABNORMAL HIGH

## 2014-09-11 NOTE — Progress Notes (Signed)
EKG read and patient discussed with Dr. Paulla Fore. Agree with assessment and plan of care per his note. Appears to be machine error in BP, pulse readings and as ASX, no further acute w/u in office, but follow up for fatigue eval and ER/RTC precautions if any worsening or new symptoms.

## 2014-09-12 ENCOUNTER — Encounter: Payer: Self-pay | Admitting: Sports Medicine

## 2014-10-24 ENCOUNTER — Encounter: Payer: 59 | Admitting: Family Medicine

## 2014-11-04 ENCOUNTER — Other Ambulatory Visit: Payer: Self-pay | Admitting: Family Medicine

## 2014-11-04 DIAGNOSIS — N63 Unspecified lump in unspecified breast: Secondary | ICD-10-CM

## 2014-11-06 ENCOUNTER — Ambulatory Visit: Payer: 59 | Admitting: Obstetrics & Gynecology

## 2014-12-18 ENCOUNTER — Telehealth: Payer: Self-pay

## 2014-12-18 NOTE — Telephone Encounter (Signed)
lmtcb to see if she can come in today for appointment.//kn

## 2014-12-29 NOTE — Telephone Encounter (Signed)
Patient was called several times-would sound like call being answered but no one talking on the other line-appointment for patient has been made prior to this opening.//kn

## 2015-01-01 ENCOUNTER — Encounter: Payer: Self-pay | Admitting: Obstetrics & Gynecology

## 2015-01-01 ENCOUNTER — Ambulatory Visit (INDEPENDENT_AMBULATORY_CARE_PROVIDER_SITE_OTHER): Payer: 59 | Admitting: Obstetrics & Gynecology

## 2015-01-01 VITALS — BP 132/64 | HR 72 | Resp 25 | Ht 62.75 in | Wt 275.0 lb

## 2015-01-01 DIAGNOSIS — Z1211 Encounter for screening for malignant neoplasm of colon: Secondary | ICD-10-CM | POA: Diagnosis not present

## 2015-01-01 DIAGNOSIS — N912 Amenorrhea, unspecified: Secondary | ICD-10-CM

## 2015-01-01 DIAGNOSIS — Z01419 Encounter for gynecological examination (general) (routine) without abnormal findings: Secondary | ICD-10-CM

## 2015-01-01 DIAGNOSIS — E119 Type 2 diabetes mellitus without complications: Secondary | ICD-10-CM | POA: Diagnosis not present

## 2015-01-01 DIAGNOSIS — Z0000 Encounter for general adult medical examination without abnormal findings: Secondary | ICD-10-CM

## 2015-01-01 LAB — POCT URINALYSIS DIPSTICK
Bilirubin, UA: NEGATIVE
Leukocytes, UA: NEGATIVE
Nitrite, UA: NEGATIVE
PROTEIN UA: NEGATIVE
RBC UA: NEGATIVE
Urobilinogen, UA: NEGATIVE
pH, UA: 5

## 2015-01-01 LAB — LIPID PANEL
CHOL/HDL RATIO: 3.8 ratio
CHOLESTEROL: 181 mg/dL (ref 0–200)
HDL: 48 mg/dL (ref >=46)
LDL CALC: 122 mg/dL — AB (ref 0–99)
Triglycerides: 57 mg/dL (ref <150)
VLDL: 11 mg/dL (ref 0–40)

## 2015-01-01 LAB — COMPREHENSIVE METABOLIC PANEL
ALT: 20 U/L (ref 0–35)
AST: 13 U/L (ref 0–37)
Albumin: 4.1 g/dL (ref 3.5–5.2)
Alkaline Phosphatase: 87 U/L (ref 39–117)
BUN: 15 mg/dL (ref 6–23)
CALCIUM: 10.3 mg/dL (ref 8.4–10.5)
CO2: 23 meq/L (ref 19–32)
Chloride: 102 mEq/L (ref 96–112)
Creat: 0.84 mg/dL (ref 0.50–1.10)
Glucose, Bld: 207 mg/dL — ABNORMAL HIGH (ref 70–99)
Potassium: 4.2 mEq/L (ref 3.5–5.3)
SODIUM: 138 meq/L (ref 135–145)
Total Bilirubin: 0.4 mg/dL (ref 0.2–1.2)
Total Protein: 7.8 g/dL (ref 6.0–8.3)

## 2015-01-01 LAB — HEMOGLOBIN A1C
Hgb A1c MFr Bld: 9.7 % — ABNORMAL HIGH (ref <5.7)
Mean Plasma Glucose: 232 mg/dL — ABNORMAL HIGH (ref <117)

## 2015-01-01 LAB — TSH: TSH: 0.548 u[IU]/mL (ref 0.350–4.500)

## 2015-01-01 NOTE — Telephone Encounter (Signed)
Patient was seen on 01/01/15 by Dr Miller.//kn

## 2015-01-01 NOTE — Progress Notes (Signed)
54 y.o. G1P1001 SingleAfrican AmericanF here for annual exam.  Pt reports last cycle was 12 days.  First two days were light and the other 10 were very heavy and clotting.  It just stopped on day 12.  She hasn't had any bleeding since.  Having hot flashes and night sweats.  This is worse for her this year.  Hot flashes wake her up every night at 3pm.    Two endometrial biopsies last year in 3/15 and 7/15.  Both were negative for abnormal cells.    PCP:  Dr. Leward Quan.  AEX was in August.  Missed follow up in March.  Last HBA1C was 8.2.    Patient's last menstrual period was 10/16/2014.          Sexually active: No.  The current method of family planning is none.    Exercising: No.  not regularly Smoker:  no  Health Maintenance: Pap:  10/22/13 WNL/negative  HR HPV History of abnormal Pap:  yes MMG:  10/22/13 3D MMG, 12/04/13 right diag/us-needs 6 month f/u, 05/15/14 6 month f/u-needs bilat diag in 6 months-scheduled for 02/03/15 Colonoscopy:  none BMD:   none TDaP:  09/13/12 Screening Labs: PCP, Hb today: PCP, Urine today: GLUCOSE-trace, KETONES-trace   reports that she has never smoked. She has never used smokeless tobacco. She reports that she does not drink alcohol or use illicit drugs.  Past Medical History  Diagnosis Date  . Allergy   . Diabetes mellitus without complication   . Asthma     50yrs ago  . Migraines     none in last 11 yrs  . Anemia   . Post partum depression   . Dysmenorrhea   . Urinary incontinence   . Pelvic kidney     left  . Chronic deafness of both ears     left ear has 40% hearing-right ear has 60 % hearing    Past Surgical History  Procedure Laterality Date  . Cesarean section    . Tympanostomy tube placement    . Knee arthroscopy with medial menisectomy Left 04/17/2014    Procedure: LEFT KNEE ARTHROSCOPY WITH PARTIAL MEDIAL MENISCECTOMY AND CHONDROPLASTY;  Surgeon: Johnn Hai, MD;  Location: [facility];  Service: Orthopedics;  Laterality: Left;     Current Outpatient Prescriptions  Medication Sig Dispense Refill  . cetirizine (ZYRTEC) 10 MG tablet Take 10 mg by mouth daily.    Marland Kitchen glipiZIDE (GLUCOTROL) 10 MG tablet Take 10 mg by mouth 2 (two) times daily before a meal.    . losartan (COZAAR) 25 MG tablet Take 25 mg by mouth at bedtime.    . metFORMIN (GLUCOPHAGE) 500 MG tablet Take 500 mg by mouth daily with breakfast.     No current facility-administered medications for this visit.    Family History  Problem Relation Age of Onset  . Diabetes Maternal Grandmother   . Heart failure Maternal Grandmother   . Heart disease Maternal Grandmother   . Hyperlipidemia Maternal Grandmother   . Hypertension Maternal Grandmother   . Hypertension Maternal Grandfather   . Hyperlipidemia Maternal Grandfather   . Hypertension Mother   . Arthritis Mother   . Other Sister     blood clot  . Cancer Sister   . Hypertension Sister   . Breast cancer Paternal Aunt   . Cancer Paternal Grandfather     ROS:  Pertinent items are noted in HPI.  Otherwise, a comprehensive ROS was negative.  Exam:   BP 132/64 mmHg  Pulse 72  Resp 25  Ht 5' 2.75" (1.594 m)  Wt 275 lb (124.739 kg)  BMI 49.09 kg/m2  LMP 10/16/2014  Weight change: +1#  Height: 5' 2.75" (159.4 cm)  Ht Readings from Last 3 Encounters:  01/01/15 5' 2.75" (1.594 m)  04/17/14 5\' 2"  (1.575 m)  04/02/14 5\' 3"  (1.6 m)    General appearance: alert, cooperative and appears stated age Head: Normocephalic, without obvious abnormality, atraumatic Neck: no adenopathy, supple, symmetrical, trachea midline and thyroid normal to inspection and palpation Lungs: clear to auscultation bilaterally Breasts: normal appearance, no masses or tenderness Heart: regular rate and rhythm Abdomen: soft, non-tender; bowel sounds normal; no masses,  no organomegaly Extremities: extremities normal, atraumatic, no cyanosis or edema Skin: Skin color, texture, turgor normal. No rashes or lesions Lymph nodes:  Cervical, supraclavicular, and axillary nodes normal. No abnormal inguinal nodes palpated Neurologic: Grossly normal   Pelvic: External genitalia:  no lesions              Urethra:  normal appearing urethra with no masses, tenderness or lesions              Bartholins and Skenes: normal                 Vagina: normal appearing vagina with normal color and discharge, no lesions              Cervix: no lesions              Pap taken: No. Bimanual Exam:  Uterus:  normal size, contour, position, consistency, mobility, non-tender and exam difficult exam due to habitus              Adnexa: normal adnexa               Rectovaginal: Confirms               Anus:  normal sphincter tone, no lesions  Chaperone was present for exam.  A:  Well Woman with normal exam H/O menorrhagia, now amenorrhea x almost 3 months H/O uterine fibroids H/O left pelvic kidney Contraception none needed not sexually active Morbid Obesity Diabetes type 2.  On Glucophage and glucotrol  P:    Mammogram yearly.  Pt has abnormal follow up scheduled in June pap smear with neg HR HPV 3/15.  No pap today. Referral to Dr. Collene Mares for colonoscopy screening HbA1C TSh, CMP, Lipids.  Will send copies to pt and PCP, if pt desires. Walker today.  If minimal change, will recommend provera challenge. return annually or prn

## 2015-01-02 LAB — FOLLICLE STIMULATING HORMONE: FSH: 21.3 m[IU]/mL

## 2015-01-06 ENCOUNTER — Telehealth: Payer: Self-pay | Admitting: Emergency Medicine

## 2015-01-06 NOTE — Telephone Encounter (Signed)
-----   Message from Megan Salon, MD sent at 01/02/2015  4:45 AM EDT ----- Please inform pt her [facility] is 21.  This hasn't changed much since last year.  Needs provera challenge called in to help trigger a cycle. She and I discussed this yesterday.  Provera 10mg  x 10 d.  CMP is fine except for elevated blood glucose.  HbA1C is 9.7.  This is higher than any other value for this that I can see over the last year.  She needs to get back with PCP or we could refer her to an endocrinologist.  TSH was normal.  Lipids ok as well.  LDL mildly elevated but total normal, triglycerides normal, and HDLs good.  OK to keep watching.

## 2015-01-06 NOTE — Telephone Encounter (Signed)
Call to patient x 2. Unable to leave voicemail as none available.  Will have to try again.

## 2015-01-13 NOTE — Telephone Encounter (Signed)
Call to patient on mobile: Message left to return call to [place] at [phone].

## 2015-01-14 MED ORDER — MEDROXYPROGESTERONE ACETATE 10 MG PO TABS: 10.0000 mg | ORAL_TABLET | Freq: Every day | ORAL | Status: DC

## 2015-01-14 NOTE — Telephone Encounter (Signed)
Called patient work number as requested and left message only to return my call.  Called mobile and no answer.

## 2015-01-14 NOTE — Telephone Encounter (Signed)
Returning a call to [place].  [phone]

## 2015-01-14 NOTE — Telephone Encounter (Signed)
Patient returned call. She is given message from Dr. Sabra Heck.  Instructions for provera challenge given.  Patient to take Provera 10 mg po daily x 10 days. Will expect menses (withdrawl bleed) up to two weeks after completing medication. Advised patient to call back when starts cycle. Advised patient to call our office if does not start cycle after two weeks of completing provera. If starts cycle while taking provera, can stop taking provera and call our office. Patient verbalized understanding of  Instructions.   Regarding hemoglobin A1C-patient states she will schedule an appointment with Dr. Leward Quan. She states she is complete colonoscopy that is scheduled for next week, then mammogram and then appointment with Dr. Leward Quan. Emphasized importance of follow up for hemoglobin A1C. Discussed lab results per Dr. Sabra Heck.  Patient verbalized understanding and agreeable to plan of care as discussed. Routing to provider for final review. Patient agreeable to disposition. Will close encounter.

## 2015-01-16 ENCOUNTER — Other Ambulatory Visit: Payer: Self-pay | Admitting: Gastroenterology

## 2015-02-03 ENCOUNTER — Ambulatory Visit: Payer: 59 | Admitting: Nurse Practitioner

## 2015-02-06 ENCOUNTER — Telehealth: Payer: Self-pay | Admitting: Obstetrics & Gynecology

## 2015-02-06 NOTE — Telephone Encounter (Signed)
Patient called requesting an appointment with Dr. Sabra Heck and said, "I am having extreme pain in my area where my ovaries are. I took Provera but now am in a lot of pain."

## 2015-02-06 NOTE — Telephone Encounter (Signed)
Spoke with patient. She is at work at the current time. She states she completed the course of Provera and subsequently started her cycle on on 01/26/15. Reports cycle was initially light, 6/20 to 6/21 then 6/23-6/28 heavy and then light flow to taper to the end of cycle through 6/29-6/30/16.  Patient states that around 6/29 she felt pain in her R ovary, sometimes in L ovary but pain in R ovary is greater than pain on L. She states this has occurred before and has happened with her cycle since she first began having cycles as a child. Patient describes pain on R side as a swelling, throbbing and ache which increases with movement. She reports some stabbing pain that radiates to her vagina yesterday and today.  Patient has not tried anything to help with the pain, she states that most medications "make me sleepy" but she would try motrin when she got home which is what she normally uses when this occurs.   Denies vaginal discharge, no vaginal bleeding. Patient denies nausea and vomiting. Denies fevers or chills. Denies Dysuria. Reports diarrhea yesterday, none today while at work.  Patient confirms she is not sexually active. Patient declines appointment today due to lack of sick time at work. She states she called to report her symptoms and obtain appointment for follow up with Dr. Sabra Heck before next cycle occurs, and wants to develop a plan with Dr. Sabra Heck for future cycles. Patient is given appointment for 02/13/15 with Dr. Sabra Heck.   Patient is given strict precautions to call back for MD on call or report to nearest Emergency Department if pain increases, develops nausea, vomiting, dysuria, fevers or chills, or difficulty tolerating fluids or foods. Patient verbalized understanding of importance of follow up if develops any increase in symptoms or any other concerns. Advised would review with Dr. Sabra Heck and if any further instructions would return call. Patient agreeable.

## 2015-02-10 ENCOUNTER — Telehealth: Payer: Self-pay | Admitting: Obstetrics & Gynecology

## 2015-02-10 NOTE — Telephone Encounter (Signed)
Noted.  Will close encounter.  

## 2015-02-10 NOTE — Telephone Encounter (Signed)
Agree with recommendations.  Ok to close encounter. 

## 2015-02-10 NOTE — Telephone Encounter (Signed)
Patient called and cancelled her appointment 02/13/15 with Dr. Sabra Heck for "follow up on Provera and ovarian pain" due to "feeling better and no pain over the weekend at all."

## 2015-02-11 NOTE — Telephone Encounter (Signed)
Dr Sabra Heck, do I need to follow up with this patient or ok that she has cancelled?kn

## 2015-02-12 NOTE — Telephone Encounter (Signed)
Ok to close encounter and not follow up with pt.  Thanks.

## 2015-02-13 ENCOUNTER — Ambulatory Visit: Payer: 59 | Admitting: Obstetrics & Gynecology

## 2015-02-13 ENCOUNTER — Ambulatory Visit
Admission: RE | Admit: 2015-02-13 | Discharge: 2015-02-13 | Disposition: A | Discharge status: Home / Self Care | Payer: 59 | Source: Ambulatory Visit | Attending: Family Medicine | Admitting: Family Medicine

## 2015-02-13 DIAGNOSIS — N63 Unspecified lump in unspecified breast: Secondary | ICD-10-CM

## 2015-03-11 ENCOUNTER — Encounter: Payer: Self-pay | Admitting: *Deleted

## 2015-03-20 ENCOUNTER — Encounter (HOSPITAL_COMMUNITY): Payer: Self-pay | Admitting: *Deleted

## 2015-03-24 ENCOUNTER — Ambulatory Visit (HOSPITAL_COMMUNITY)
Admission: RE | Admit: 2015-03-24 | Discharge: 2015-03-24 | Disposition: A | Discharge status: Home / Self Care | Payer: 59 | Source: Ambulatory Visit | Attending: Gastroenterology | Admitting: Gastroenterology

## 2015-03-24 ENCOUNTER — Encounter (HOSPITAL_COMMUNITY): Payer: Self-pay

## 2015-03-24 ENCOUNTER — Encounter (HOSPITAL_COMMUNITY)
Admission: RE | Disposition: A | Discharge status: Home / Self Care | Payer: Self-pay | Source: Ambulatory Visit | Attending: Gastroenterology

## 2015-03-24 ENCOUNTER — Ambulatory Visit (HOSPITAL_COMMUNITY): Payer: 59 | Admitting: Certified Registered Nurse Anesthetist

## 2015-03-24 DIAGNOSIS — K648 Other hemorrhoids: Secondary | ICD-10-CM | POA: Diagnosis not present

## 2015-03-24 DIAGNOSIS — J45909 Unspecified asthma, uncomplicated: Secondary | ICD-10-CM | POA: Diagnosis not present

## 2015-03-24 DIAGNOSIS — Z1211 Encounter for screening for malignant neoplasm of colon: Secondary | ICD-10-CM | POA: Insufficient documentation

## 2015-03-24 DIAGNOSIS — K635 Polyp of colon: Secondary | ICD-10-CM | POA: Insufficient documentation

## 2015-03-24 DIAGNOSIS — E119 Type 2 diabetes mellitus without complications: Secondary | ICD-10-CM | POA: Insufficient documentation

## 2015-03-24 DIAGNOSIS — H9193 Unspecified hearing loss, bilateral: Secondary | ICD-10-CM | POA: Insufficient documentation

## 2015-03-24 DIAGNOSIS — Z79899 Other long term (current) drug therapy: Secondary | ICD-10-CM | POA: Diagnosis not present

## 2015-03-24 DIAGNOSIS — Z6841 Body Mass Index (BMI) 40.0 and over, adult: Secondary | ICD-10-CM | POA: Insufficient documentation

## 2015-03-24 DIAGNOSIS — G4733 Obstructive sleep apnea (adult) (pediatric): Secondary | ICD-10-CM | POA: Insufficient documentation

## 2015-03-24 HISTORY — DX: Other complications of anesthesia, initial encounter: T88.59XA

## 2015-03-24 HISTORY — DX: Adverse effect of unspecified anesthetic, initial encounter: T41.45XA

## 2015-03-24 HISTORY — PX: COLONOSCOPY WITH PROPOFOL: SHX5780

## 2015-03-24 LAB — GLUCOSE, CAPILLARY: Glucose-Capillary: 169 mg/dL — ABNORMAL HIGH (ref 65–99)

## 2015-03-24 SURGERY — COLONOSCOPY WITH PROPOFOL
Anesthesia: Monitor Anesthesia Care

## 2015-03-24 MED ORDER — PROPOFOL 10 MG/ML IV BOLUS
INTRAVENOUS | Status: AC
  Filled 2015-03-24: qty 20

## 2015-03-24 MED ORDER — LACTATED RINGERS IV SOLN
INTRAVENOUS | Status: DC
  Administered 2015-03-24: 1000 mL via INTRAVENOUS

## 2015-03-24 MED ORDER — ONDANSETRON HCL 4 MG/2ML IJ SOLN
INTRAMUSCULAR | Status: DC | PRN
  Administered 2015-03-24: 4 mg via INTRAVENOUS

## 2015-03-24 MED ORDER — ONDANSETRON HCL 4 MG/2ML IJ SOLN
INTRAMUSCULAR | Status: AC
  Filled 2015-03-24: qty 2

## 2015-03-24 MED ORDER — LIDOCAINE HCL (CARDIAC) 20 MG/ML IV SOLN
INTRAVENOUS | Status: AC
  Filled 2015-03-24: qty 5

## 2015-03-24 MED ORDER — PROPOFOL INFUSION 10 MG/ML OPTIME
INTRAVENOUS | Status: DC | PRN
  Administered 2015-03-24: 300 ug/kg/min via INTRAVENOUS

## 2015-03-24 MED ORDER — LIDOCAINE HCL (CARDIAC) 20 MG/ML IV SOLN
INTRAVENOUS | Status: DC | PRN
  Administered 2015-03-24: 100 mg via INTRAVENOUS

## 2015-03-24 MED ORDER — PROPOFOL 10 MG/ML IV BOLUS
INTRAVENOUS | Status: AC
Start: 2015-03-24 — End: 2015-03-24
  Filled 2015-03-24: qty 20

## 2015-03-24 MED ORDER — SODIUM CHLORIDE 0.9 % IV SOLN: INTRAVENOUS | Status: DC

## 2015-03-24 SURGICAL SUPPLY — 22 items

## 2015-03-24 NOTE — Anesthesia Preprocedure Evaluation (Signed)
Anesthesia Evaluation  Patient identified by MRN, date of birth, ID band Patient awake    Reviewed: Allergy & Precautions, H&P , NPO status , Patient's Chart, lab work & pertinent test results  History of Anesthesia Complications (+) PONV and PROLONGED EMERGENCENegative for: history of anesthetic complications  Airway Mallampati: III  TM Distance: >3 FB Neck ROM: full    Dental no notable dental hx. (+) Teeth Intact, Dental Advisory Given   Pulmonary shortness of breath and with exertion, asthma , sleep apnea ,  breath sounds clear to auscultation  Pulmonary exam normal       Cardiovascular Exercise Tolerance: Good negative cardio ROS Normal cardiovascular examRhythm:regular Rate:Normal     Neuro/Psych negative neurological ROS  negative psych ROS   GI/Hepatic negative GI ROS, Neg liver ROS,   Endo/Other  diabetes, Well Controlled, Type 2, Oral Hypoglycemic AgentsMorbid obesity  Renal/GU negative Renal ROS  negative genitourinary   Musculoskeletal   Abdominal (+) + obese,   Peds  Hematology negative hematology ROS (+)   Anesthesia Other Findings   Reproductive/Obstetrics negative OB ROS                             Anesthesia Physical  Anesthesia Plan  ASA: III  Anesthesia Plan: MAC   Post-op Pain Management:    Induction:   Airway Management Planned: Simple Face Mask  Additional Equipment:   Intra-op Plan:   Post-operative Plan:   Informed Consent: I have reviewed the patients History and Physical, chart, labs and discussed the procedure including the risks, benefits and alternatives for the proposed anesthesia with the patient or authorized representative who has indicated his/her understanding and acceptance.   Dental Advisory Given  Plan Discussed with: CRNA and Surgeon  Anesthesia Plan Comments:         Anesthesia Quick Evaluation

## 2015-03-24 NOTE — Anesthesia Postprocedure Evaluation (Signed)
  Anesthesia Post-op Note  Patient: Jill [name]  Procedure(s) Performed: Procedure(s) (LRB): COLONOSCOPY WITH PROPOFOL (N/A)  Patient Location: PACU  Anesthesia Type: MAC  Level of Consciousness: awake and alert   Airway and Oxygen Therapy: Patient Spontanous Breathing  Post-op Pain: mild  Post-op Assessment: Post-op Vital signs reviewed, Patient's Cardiovascular Status Stable, Respiratory Function Stable, Patent Airway and No signs of Nausea or vomiting  Last Vitals:  Filed Vitals:   03/24/15 0805  BP:   Pulse: 79  Temp:   Resp: 21    Post-op Vital Signs: stable   Complications: No apparent anesthesia complications

## 2015-03-24 NOTE — H&P (Signed)
Jill [name] is an 54 y.o. female.   Chief Complaint: Colorectal cancer screening. HPI: 54 year old black female here for a screening colonoscopy. She has OSA and is morbidly obese with a BMI of 52.71. See office notes for details.  Past Medical History  Diagnosis Date  . Allergy   . Diabetes mellitus without complication   . Asthma     39yrs ago  . Migraines     none in last 11 yrs  . Anemia   . Post partum depression   . Dysmenorrhea   . Urinary incontinence   . Chronic deafness of both ears     left ear has 40% hearing-right ear has 60 % hearing  . Pelvic kidney     left  . Complication of anesthesia     Hard to wake up   Past Surgical History  Procedure Laterality Date  . Cesarean section    . Tympanostomy tube placement    . Knee arthroscopy with medial menisectomy Left 04/17/2014    Procedure: LEFT KNEE ARTHROSCOPY WITH PARTIAL MEDIAL MENISCECTOMY AND CHONDROPLASTY;  Surgeon: Johnn Hai, MD;  Location: [facility];  Service: Orthopedics;  Laterality: Left;   Family History  Problem Relation Age of Onset  . Diabetes Maternal Grandmother   . Heart failure Maternal Grandmother   . Heart disease Maternal Grandmother   . Hyperlipidemia Maternal Grandmother   . Hypertension Maternal Grandmother   . Hypertension Maternal Grandfather   . Hyperlipidemia Maternal Grandfather   . Hypertension Mother   . Arthritis Mother   . Other Sister     blood clot  . Cancer Sister   . Hypertension Sister   . Breast cancer Paternal Aunt   . Cancer Paternal Grandfather    Social History:  reports that she has never smoked. She has never used smokeless tobacco. She reports that she does not drink alcohol or use illicit drugs.  Allergies:  Allergies  Allergen Reactions  . Adhesive [Tape]     Pulls skin off  . Latex Itching  . Toradol [Ketorolac Tromethamine]     Joint pain and swelling  . Tramadol Other (See Comments)    JOINT PAIN   Medications Prior to Admission   Medication Sig Dispense Refill  . cetirizine (ZYRTEC) 10 MG tablet Take 10 mg by mouth at bedtime.     Marland Kitchen glipiZIDE (GLUCOTROL) 10 MG tablet Take 10 mg by mouth 2 (two) times daily before a meal.    . losartan (COZAAR) 25 MG tablet Take 25 mg by mouth at bedtime.    . metFORMIN (GLUCOPHAGE) 500 MG tablet Take 500 mg by mouth every evening.     . medroxyPROGESTERone (PROVERA) 10 MG tablet Take 1 tablet (10 mg total) by mouth daily. For ten days. (Patient not taking: Reported on 03/06/2015) 10 tablet 0    Results for orders placed or performed during the hospital encounter of 03/24/15 (from the past 48 hour(s))  Glucose, capillary     Status: Abnormal   Collection Time: 03/24/15  7:18 AM  Result Value Ref Range   Glucose-Capillary 169 (H) 65 - 99 mg/dL  Review of Systems  Constitutional: Negative.   HENT: Negative.   Eyes: Negative.   Respiratory: Negative.   Cardiovascular: Negative.   Gastrointestinal: Positive for constipation.  Musculoskeletal: Negative.   Skin: Negative.   Neurological: Negative.   Endo/Heme/Allergies: Negative.   Psychiatric/Behavioral: Negative.    Blood pressure 179/65, pulse 83, temperature 97.8 F (36.6 C), temperature  source Oral, resp. rate 19, height 5\' 1"  (1.549 m), weight 117.935 kg (260 lb), last menstrual period 01/25/2015, SpO2 100 %. Physical Exam  Constitutional: She appears well-developed and well-nourished.  HENT:  Head: Normocephalic and atraumatic.  Eyes: Conjunctivae and EOM are normal. Pupils are equal, round, and reactive to light.  Neck: Normal range of motion. Neck supple.  Cardiovascular: Normal rate and regular rhythm.   GI: Soft. Bowel sounds are normal.  Musculoskeletal: Normal range of motion.  Skin: Skin is warm and dry.  Psychiatric: She has a normal mood and affect. Her behavior is normal. Judgment and thought content normal.    Assessment/Plan Chronic constipation/CRC screening; proceed with a colonoscopy at this  time.  Rawson Minix 03/24/2015, 7:26 AM

## 2015-03-24 NOTE — Discharge Instructions (Signed)

## 2015-03-24 NOTE — Op Note (Signed)
[facility] [place], [address]   OPERATIVE PROCEDURE REPORT  PATIENT: Jill [name], Jill [name]  MR#: [id number] BIRTHDATE: Jan 26, 1961 GENDER: female ENDOSCOPIST: Edmonia James, MD ASSISTANT:   Sharon Mt, technician & Tory Emerald, RN PROCEDURE DATE: 16-Apr-2015 PRE-PROCEDURE PREPARATION: Patient fasted for 4 hours prior to procedure. The patient was prepped with a gallon of Golytely the night prior to the procedure.  The patient has fasted for 4 hours prior to the procedure PRE-PROCEDURE PHYSICAL: Patient has stable vital signs.  Neck is supple.  There is no JVD, thyromegaly or LAD.  Chest clear to auscultation.  S1 and S2 regular.  Abdomen soft, morbidly obese, non-distended, non-tender with NABS. PROCEDURE:     Colonoscopy with cold biopsies x 2. ASA CLASS:     Class III INDICATIONS:     1.  Colorectal cancer screening-average risk patient for colon cancer. MEDICATIONS:     Monitored anesthesia care  DESCRIPTION OF PROCEDURE: After the risks, benefits, and alternatives of the procedure were thoroughly explained [including a 10% missed rate of cancer and polyps], informed consent was obtained. Digital rectal exam was performed.  The EC-[id number] ([id number])  was introduced through the anus  and advanced to the cecum, which was identified by both the appendix and ileocecal valve , limited by No adverse events experienced.  The quality of the prep was good. Multiple washes were done. Small lesions could be missed. The instrument was then slowly withdrawn as the colon was fully examined. Estimated blood loss is zero unless otherwise noted in this procedure report.     COLON FINDINGS: A single dimunitive polyp was found in the proximal ascending colon and was removed by cold biopsies x 2 using cold forceps. Sample was obtained and sent to histology. The rest of the entire colonic mucosa appeared healthy with a normal vascular pattern. No masses,  diverticula or AVMs were noted. The appendiceal orifice and the ICV were identified and photographed.  . Retroflexed views revealed small internal hemorrhoids. The patient tolerated the procedure without immediate complications.  The scope was then withdrawn from the patient and the procedure terminated.  TIME TO CECUM:   03 minutes 00 seconds WITHDRAW TIME:  11 minutes 00 seconds  IMPRESSION:     One single dimunitive polyp was found in the proximal ascending colon-removed by cold biopsies x 2; small internal hemorrhoids; otherwise normal exam upto the cecum.Marland Kitchen  RECOMMENDATIONS:     1.  Hold Aspirin and all other NSAIDS for 2 weeks. 2.  Continue current medications. 3.  Continue surveillance. 4.  High fiber diet with liberal fluid intake. 5.  OP follow-up is advised on a PRN basis.  REPEAT EXAM:      A repat colonoscopy is advised in 7  years. If the patient has any abnormal GI symptoms in the interim, she have been advised to contact the office as soon as possible for further recommendations.   REFERRED EN:IDPO Edwinna Areola, M.D. Ellsworth Lennox, M.D. eSigned:  Edmonia James, MD 04/16/15 8:01 AM  CPT CODES:     [phone] Colonoscopy, flexible, proximal to splenic flexure; with biopsy, single or multiple ICD CODES:     Z12.11 Encounter for screening for malignant neoplasm of colon D12.2 Benign neoplasm of ascending colon  The ICD and CPT codes recommended by this software are interpretations from the data that the clinical staff has captured with the software.  The verification of the translation of this report to the ICD and CPT codes  and modifiers is the sole responsibility of the health care institution and practicing physician where this report was generated.  [employer] will not be held responsible for the validity of the ICD and CPT codes included on this report.  AMA assumes no liability for data contained or not contained herein. CPT is a  Designer, television/film set of the [employer].  PATIENT NAME:  Jill [name] MR#: [id number]

## 2015-03-24 NOTE — Transfer of Care (Signed)
Immediate Anesthesia Transfer of Care Note  Patient: Jill [name]  Procedure(s) Performed: Procedure(s): COLONOSCOPY WITH PROPOFOL (N/A)  Patient Location: PACU  Anesthesia Type:MAC  Level of Consciousness:  sedated, patient cooperative and responds to stimulation  Airway & Oxygen Therapy:Patient Spontanous Breathing and Patient connected to face mask oxgen  Post-op Assessment:  Report given to PACU RN and Post -op Vital signs reviewed and stable  Post vital signs:  Reviewed and stable  Last Vitals:  Filed Vitals:   03/24/15 0658  BP: 179/65  Pulse: 83  Temp: 36.6 C  Resp: 19    Complications: No apparent anesthesia complications

## 2015-03-25 ENCOUNTER — Encounter (HOSPITAL_COMMUNITY): Payer: Self-pay | Admitting: Gastroenterology

## 2015-05-07 ENCOUNTER — Other Ambulatory Visit: Payer: Self-pay

## 2015-05-07 MED ORDER — METFORMIN HCL 500 MG PO TABS: 500.0000 mg | ORAL_TABLET | Freq: Every evening | ORAL | Status: DC

## 2015-05-07 MED ORDER — GLIPIZIDE 10 MG PO TABS: 10.0000 mg | ORAL_TABLET | Freq: Two times a day (BID) | ORAL | Status: DC

## 2015-05-25 ENCOUNTER — Ambulatory Visit (INDEPENDENT_AMBULATORY_CARE_PROVIDER_SITE_OTHER): Payer: 59 | Admitting: Physician Assistant

## 2015-05-25 ENCOUNTER — Encounter: Payer: Self-pay | Admitting: Physician Assistant

## 2015-05-25 VITALS — BP 134/76 | HR 95 | Temp 98.3°F | Resp 16 | Ht 62.75 in | Wt 275.4 lb

## 2015-05-25 DIAGNOSIS — Z131 Encounter for screening for diabetes mellitus: Secondary | ICD-10-CM

## 2015-05-25 DIAGNOSIS — Z130 Encounter for screening for diseases of the blood and blood-forming organs and certain disorders involving the immune mechanism: Secondary | ICD-10-CM

## 2015-05-25 DIAGNOSIS — E119 Type 2 diabetes mellitus without complications: Secondary | ICD-10-CM | POA: Diagnosis not present

## 2015-05-25 DIAGNOSIS — Z13228 Encounter for screening for other metabolic disorders: Secondary | ICD-10-CM

## 2015-05-25 DIAGNOSIS — Z1329 Encounter for screening for other suspected endocrine disorder: Secondary | ICD-10-CM | POA: Diagnosis not present

## 2015-05-25 DIAGNOSIS — Z13220 Encounter for screening for lipoid disorders: Secondary | ICD-10-CM

## 2015-05-25 DIAGNOSIS — Z0000 Encounter for general adult medical examination without abnormal findings: Secondary | ICD-10-CM | POA: Diagnosis not present

## 2015-05-25 DIAGNOSIS — Z23 Encounter for immunization: Secondary | ICD-10-CM | POA: Diagnosis not present

## 2015-05-25 LAB — COMPLETE METABOLIC PANEL WITH GFR
ALBUMIN: 4.2 g/dL (ref 3.6–5.1)
ALK PHOS: 90 U/L (ref 33–130)
ALT: 24 U/L (ref 6–29)
AST: 13 U/L (ref 10–35)
BUN: 14 mg/dL (ref 7–25)
CHLORIDE: 100 mmol/L (ref 98–110)
CO2: 27 mmol/L (ref 20–31)
Calcium: 10.3 mg/dL (ref 8.6–10.4)
Creat: 0.82 mg/dL (ref 0.50–1.05)
GFR, EST NON AFRICAN AMERICAN: 82 mL/min (ref >=60)
GFR, Est African American: >89 mL/min (ref >=60)
GLUCOSE: 224 mg/dL — AB (ref 65–99)
POTASSIUM: 3.9 mmol/L (ref 3.5–5.3)
SODIUM: 138 mmol/L (ref 135–146)
Total Bilirubin: 0.4 mg/dL (ref 0.2–1.2)
Total Protein: 7.9 g/dL (ref 6.1–8.1)

## 2015-05-25 LAB — LIPID PANEL
CHOL/HDL RATIO: 3.2 ratio (ref <=5.0)
Cholesterol: 168 mg/dL (ref 125–200)
HDL: 53 mg/dL (ref >=46)
LDL CALC: 101 mg/dL (ref <130)
Triglycerides: 68 mg/dL (ref <150)
VLDL: 14 mg/dL (ref <30)

## 2015-05-25 LAB — GLUCOSE, POCT (MANUAL RESULT ENTRY): POC Glucose: 252 mg/dl — AB (ref 70–99)

## 2015-05-25 LAB — POCT GLYCOSYLATED HEMOGLOBIN (HGB A1C): Hemoglobin A1C: 9.2

## 2015-05-25 LAB — MICROALBUMIN, URINE: Microalb, Ur: 1.7 mg/dL

## 2015-05-25 LAB — HEMOGLOBIN A1C: Hgb A1c MFr Bld: 9.2 % — AB (ref 4.0–6.0)

## 2015-05-25 NOTE — Progress Notes (Signed)
[facility] [address] [place] [address] [phone]  Date:  05/25/2015   Name:  Jill [name]   DOB:  15-Jul-1961   MRN:  [id number]  PCP:  Ellsworth Lennox, MD    History of Present Illness:  Jill [name] is a 54 y.o. female patient who presents to [facility] for an annual physical exam, diabetic follow up.  DM2: States her morning fast 120-150 constantly.  She takes glipizide as prescribed and 500mg  of metformin daily for the past 2 years.  She is trying to avoid the side effects of diarrhea.  Though she states that she takes she has been having constipation.  She has been taking 3 colace daily to have bm daily.  Without, her bm is q2-3 days.  No blood in stool.  She has attempted to make dietary changes--avoiding starches, pastas, and breads.  She may have sweets every 3 months.  She has no sodas for the last 2 months.  Seldom has juice.  6 glases per day. Urinary urgency.  Was given sample of rub on wrist from gynecologist which seemed to help.  No hematuria or dysuria.    20%-30% left, right 60%.    Constipation, days of qd.  3 pills colace which helps qd.  2-3 days.  No blood in the stool.     Eye doctor missed appointment, attempting to schedule at this time. [facility].    She also states that she has found that her mind is wandering.  Easily breaks concentration with any noise.   Exercise: Not exercising due to injury of left knee.    Patient Active Problem List   Diagnosis Date Noted  . Pelvic kidney   . Diabetes mellitus without complication ([place])   . OBESITY, MORBID 07/30/2009  . DYSPNEA ON EXERTION 07/30/2009  . DIABETES MELLITUS, TYPE II, UNCONTROLLED 05/19/2009  . OBSTRUCTIVE SLEEP APNEA 05/19/2009  . FATIGUE 05/19/2009  . SYMPTOM, DISTURBANCE, SLEEP NOS 06/01/2007  . MOTION SICKNESS 03/12/2007  . ASTHMA 01/31/2007  . GERD 01/31/2007  . MIGRAINE, MENSTRUAL 01/31/2007    Past Medical History  Diagnosis  Date  . Allergy   . Diabetes mellitus without complication ([place])   . Asthma     39yrs ago  . Migraines     none in last 11 yrs  . Anemia   . Post partum depression   . Dysmenorrhea   . Urinary incontinence   . Chronic deafness of both ears     left ear has 40% hearing-right ear has 60 % hearing  . Pelvic kidney     left  . Complication of anesthesia     Hard to wake up    Past Surgical History  Procedure Laterality Date  . Cesarean section    . Tympanostomy tube placement    . Knee arthroscopy with medial menisectomy Left 04/17/2014    Procedure: LEFT KNEE ARTHROSCOPY WITH PARTIAL MEDIAL MENISCECTOMY AND CHONDROPLASTY;  Surgeon: Johnn Hai, MD;  Location: [facility] OR[facility];  Service: Orthopedics;  Laterality: Left;  . Colonoscopy with propofol N/A 03/24/2015    Procedure: COLONOSCOPY WITH PROPOFOL;  Surgeon: Juanita Craver, MD;  Location: [facility] ENDOSCOPY;  Service: Endoscopy;  Laterality: N/A;    Social History  Substance Use Topics  . Smoking status: Never Smoker   . Smokeless tobacco: Never Used  . Alcohol Use: No    Family History  Problem Relation Age of Onset  . Diabetes Maternal Grandmother   .  Heart failure Maternal Grandmother   . Heart disease Maternal Grandmother   . Hyperlipidemia Maternal Grandmother   . Hypertension Maternal Grandmother   . Hypertension Maternal Grandfather   . Hyperlipidemia Maternal Grandfather   . Hypertension Mother   . Arthritis Mother   . Other Sister     blood clot  . Cancer Sister   . Hypertension Sister   . Breast cancer Paternal Aunt   . Cancer Paternal Grandfather     Allergies  Allergen Reactions  . Adhesive [Tape]     Pulls skin off  . Latex Itching  . Toradol [Ketorolac Tromethamine]     Joint pain and swelling  . Tramadol Other (See Comments)    JOINT PAIN    Medication list has been reviewed and updated.  Current Outpatient Prescriptions on File Prior to Visit  Medication Sig Dispense Refill  . cetirizine (ZYRTEC)  10 MG tablet Take 10 mg by mouth at bedtime.     Marland Kitchen glipiZIDE (GLUCOTROL) 10 MG tablet Take 1 tablet (10 mg total) by mouth 2 (two) times daily before a meal. PATIENT NEEDS OFFICE VISIT FOR ADDITIONAL REFILLS 60 tablet 0  . losartan (COZAAR) 25 MG tablet Take 25 mg by mouth at bedtime.    . metFORMIN (GLUCOPHAGE) 500 MG tablet Take 1 tablet (500 mg total) by mouth every evening. PATIENT NEEDS OFFICE VISIT FOR ADDITIONAL REFILLS 30 tablet 0  . medroxyPROGESTERone (PROVERA) 10 MG tablet Take 1 tablet (10 mg total) by mouth daily. For ten days. (Patient not taking: Reported on 03/06/2015) 10 tablet 0   No current facility-administered medications on file prior to visit.    Review of Systems  Constitutional: Negative for fever and chills.  HENT: Positive for hearing loss (eustachian tube in place at right.  hearing loss right>left). Negative for ear discharge, ear pain and sore throat.   Eyes: Negative for blurred vision and double vision.  Respiratory: Negative for cough, shortness of breath and wheezing.   Cardiovascular: Positive for leg swelling. Negative for chest pain and palpitations.  Gastrointestinal: Positive for constipation. Negative for nausea, vomiting and diarrhea.  Genitourinary: Negative for dysuria, frequency and hematuria.  Musculoskeletal: Positive for joint pain (chronic right knee pain).  Skin: Negative for itching and rash.  Neurological: Negative for dizziness and headaches.     Physical Examination: BP 134/76 mmHg  Pulse 95  Temp(Src) 98.3 F (36.8 C) (Oral)  Resp 16  Ht 5' 2.75" (1.594 m)  Wt 275 lb 6.4 oz (124.921 kg)  BMI 49.17 kg/m2  SpO2 96%  LMP 11/07/2014 Ideal Body Weight: Weight in (lb) to have BMI = 25: 139.7 Wt Readings from Last 3 Encounters:  05/25/15 275 lb 6.4 oz (124.921 kg)  03/24/15 260 lb (117.935 kg)  01/01/15 275 lb (124.739 kg)    Physical Exam  Constitutional: She is oriented to person, place, and time. She appears well-developed and  well-nourished. No distress.  HENT:  Head: Normocephalic and atraumatic.  Right Ear: External ear and ear canal normal.  Left Ear: External ear and ear canal normal.  Nose: No mucosal edema or rhinorrhea.  Mouth/Throat: No oropharyngeal exudate, posterior oropharyngeal edema or posterior oropharyngeal erythema.  Eustachian tube in place at right.  Eyes: Conjunctivae and EOM are normal. Pupils are equal, round, and reactive to light.  Cardiovascular: Normal rate and regular rhythm.  Exam reveals no gallop, no distant heart sounds and no friction rub.   No murmur heard. Pulses:      Radial  pulses are 2+ on the right side, and 2+ on the left side.       Popliteal pulses are 2+ on the left side.       Dorsalis pedis pulses are 1+ on the right side, and 1+ on the left side.  Pulmonary/Chest: Effort normal. No respiratory distress. She has no decreased breath sounds. She has no wheezes. She has no rhonchi.  Abdominal: Soft. Normal appearance and bowel sounds are normal. There is no tenderness. There is no tenderness at McBurney's point and negative Murphy's sign.  Hepato/splenomegaly not appreciated due to body habitus  Musculoskeletal: Normal range of motion. She exhibits edema (non-pitting).  Neurological: She is alert and oriented to person, place, and time. No cranial nerve deficit. She displays a negative Romberg sign. Coordination normal.  Skin: She is not diaphoretic.  Psychiatric: She has a normal mood and affect. Her behavior is normal.    Visual Acuity Screening   Right eye Left eye Both eyes  Without correction: 20/200 20/40 20/50  With correction:       Assessment and Plan: Jill [name] is a 54 y.o. female who is here today for annual physical exam and diabetic follow up.   She would like to change medications.  At this time, I am going to await the results of kidney and liver function before changing meds.  There is doubt of the dietary management and glucose reading, as  her elevated a1c should not reflect that. Colonoscopy q7 years from previous colonoscopy this year (2023) Recommend she return to optometrist Agreed to join a gym ([employer]?) and work out 5days per week.   Annual physical exam - Plan: HM Diabetes Foot Exam, Flu Vaccine QUAD 36+ mos IM, POCT glycosylated hemoglobin (Hb A1C), POCT glucose (manual entry), Lipid panel, CBC, TSH, COMPLETE METABOLIC PANEL WITH GFR, Microalbumin, urine  Screening for diabetes mellitus - Plan: HM Diabetes Foot Exam, POCT glycosylated hemoglobin (Hb A1C), POCT glucose (manual entry)  Need for prophylactic vaccination and inoculation against influenza - Plan: Flu Vaccine QUAD 36+ mos IM  Type 2 diabetes mellitus without complication, without long-term current use of insulin ([place]) - Plan: HM Diabetes Foot Exam, POCT glycosylated hemoglobin (Hb A1C), POCT glucose (manual entry), Microalbumin, urine  Screening for thyroid disorder - Plan: TSH  Screening for metabolic disorder - Plan: COMPLETE METABOLIC PANEL WITH GFR  Screening for deficiency anemia - Plan: CBC  Screening for lipid disorders - Plan: Lipid panel    Ivar Drape, PA-C [facility] 05/25/2015 10:19 AM

## 2015-05-25 NOTE — Patient Instructions (Signed)
I will have your results within the next 10 days. We will then have a change in medication that will be more useful to your glucose. Please try to get in with the gym.  Let's work on a plan of exercise at least 3 times a week.  We have a goal of 4 times per week which will lower cardiovascular risk and diabetes.

## 2015-05-26 LAB — CBC
HCT: 38.1 % (ref 36.0–46.0)
Hemoglobin: 12.5 g/dL (ref 12.0–15.0)
MCH: 25 pg — ABNORMAL LOW (ref 26.0–34.0)
MCHC: 32.8 g/dL (ref 30.0–36.0)
MCV: 76 fL — ABNORMAL LOW (ref 78.0–100.0)
MPV: 10.7 fL (ref 8.6–12.4)
PLATELETS: 257 10*3/uL (ref 150–400)
RBC: 5.01 MIL/uL (ref 3.87–5.11)
RDW: 17.4 % — AB (ref 11.5–15.5)
WBC: 6.9 10*3/uL (ref 4.0–10.5)

## 2015-05-26 LAB — TSH: TSH: 1.141 u[IU]/mL (ref 0.350–4.500)

## 2015-05-27 ENCOUNTER — Encounter: Payer: Self-pay | Admitting: Physician Assistant

## 2015-06-01 ENCOUNTER — Encounter: Payer: Self-pay | Admitting: Family Medicine

## 2015-06-02 ENCOUNTER — Other Ambulatory Visit: Payer: Self-pay | Admitting: Physician Assistant

## 2015-06-02 DIAGNOSIS — E1165 Type 2 diabetes mellitus with hyperglycemia: Secondary | ICD-10-CM

## 2015-06-02 MED ORDER — METFORMIN HCL ER 750 MG PO TB24: 750.0000 mg | ORAL_TABLET | Freq: Two times a day (BID) | ORAL | Status: DC

## 2015-06-02 MED ORDER — GLUCOSE BLOOD VI STRP: ORAL_STRIP | Status: DC

## 2015-06-02 NOTE — Telephone Encounter (Signed)
Denied metformin and glipizide per lab result notes and sent in Rx of test strips.

## 2015-06-03 ENCOUNTER — Other Ambulatory Visit: Payer: Self-pay | Admitting: Physician Assistant

## 2015-06-03 DIAGNOSIS — E119 Type 2 diabetes mellitus without complications: Secondary | ICD-10-CM

## 2015-06-03 MED ORDER — CANAGLIFLOZIN 100 MG PO TABS: 100.0000 mg | ORAL_TABLET | Freq: Every day | ORAL | Status: DC

## 2015-06-03 NOTE — Progress Notes (Signed)
Filled invokana 100mg  per day.  Sent to pharmacy.  Please take glucose twice per day to daily.  I want to see you in 3 months.

## 2015-06-03 NOTE — Progress Notes (Signed)
Left message letting pt know. 

## 2015-07-18 ENCOUNTER — Ambulatory Visit (INDEPENDENT_AMBULATORY_CARE_PROVIDER_SITE_OTHER): Payer: 59 | Admitting: Family Medicine

## 2015-07-18 ENCOUNTER — Telehealth: Payer: Self-pay

## 2015-07-18 VITALS — BP 108/72 | HR 105 | Temp 98.7°F | Resp 14 | Ht 62.75 in | Wt 273.0 lb

## 2015-07-18 DIAGNOSIS — H6692 Otitis media, unspecified, left ear: Secondary | ICD-10-CM

## 2015-07-18 DIAGNOSIS — R05 Cough: Secondary | ICD-10-CM

## 2015-07-18 DIAGNOSIS — R059 Cough, unspecified: Secondary | ICD-10-CM

## 2015-07-18 MED ORDER — HYDROCODONE-HOMATROPINE 5-1.5 MG/5ML PO SYRP: 5.0000 mL | ORAL_SOLUTION | Freq: Three times a day (TID) | ORAL | Status: DC | PRN

## 2015-07-18 MED ORDER — AMOXICILLIN-POT CLAVULANATE 875-125 MG PO TABS: 1.0000 | ORAL_TABLET | Freq: Two times a day (BID) | ORAL | Status: DC

## 2015-07-18 NOTE — Telephone Encounter (Signed)
Pt was seen earlier today for a cough and now the stuff she is couging up has a red tint should she be concerned or is it just th cough medication since it is red color

## 2015-07-18 NOTE — Patient Instructions (Signed)

## 2015-07-18 NOTE — Progress Notes (Signed)
Patient ID: Jill [name], female   DOB: 05-Aug-1961, 54 y.o.   MRN: [id number]   This chart was scribed for Robyn Haber, MD by [facility], medical scribe at [facility].The patient was seen in exam room ** and the patient's care was started at 9:49 AM.  Patient ID: Jill [name] MRN: [id number], DOB: 12-01-1960, 54 y.o. Date of Encounter: 07/18/2015  Primary Physician: Ellsworth Lennox, MD  Chief Complaint:  Chief Complaint  Patient presents with  . Sore Throat    Began yesterday   . Ear Pain    Left side   . Cough    Began last night    HPI:  Jill [name] is a 54 y.o. female with a hx of diabetes who presents to [facility] with multiple concerns. Sore throat, congestion, rhinorrhea, and left ear pain began yesterday. Her cough began yesterday but worse today. Unsure of a fever.  Past Medical History  Diagnosis Date  . Allergy   . Diabetes mellitus without complication ([place])   . Asthma     68yrs ago  . Migraines     none in last 11 yrs  . Anemia   . Post partum depression   . Dysmenorrhea   . Urinary incontinence   . Chronic deafness of both ears     left ear has 40% hearing-right ear has 60 % hearing  . Pelvic kidney     left  . Complication of anesthesia     Hard to wake up    Home Meds: Prior to Admission medications   Medication Sig Start Date End Date Taking? Authorizing Provider  canagliflozin (INVOKANA) 100 MG TABS tablet Take 1 tablet (100 mg total) by mouth daily. 06/03/15  Yes Stephanie D English, PA  glucose blood test strip Test blood sugar daily. E11.9 06/02/15  Yes Stephanie D English, PA  losartan (COZAAR) 25 MG tablet Take 25 mg by mouth at bedtime.   Yes Historical Provider, MD  metFORMIN (GLUCOPHAGE-XR) 750 MG 24 hr tablet Take 1 tablet (750 mg total) by mouth 2 (two) times daily. 06/02/15  Yes Dorian Heckle English, PA  cetirizine (ZYRTEC) 10 MG tablet Take 10 mg by mouth at bedtime.      Historical Provider, MD   Allergies:  Allergies  Allergen Reactions  . Adhesive [Tape]     Pulls skin off  . Latex Itching  . Toradol [Ketorolac Tromethamine]     Joint pain and swelling  . Tramadol Other (See Comments)    JOINT PAIN   Social History   Social History  . Marital Status: Single    Spouse Name: N/A  . Number of Children: N/A  . Years of Education: N/A   Occupational History  . social services case worker   .  [place] History Main Topics  . Smoking status: Never Smoker   . Smokeless tobacco: Never Used  . Alcohol Use: No  . Drug Use: No  . Sexual Activity: No   Other Topics Concern  . Not on file   Social History Narrative   Single. Education: [employer].     Review of Systems: Constitutional: negative for chills, fever, night sweats, weight changes, or fatigue  HEENT: negative for vision changes, hearing loss, epistaxis, or sinus pressure. Positive for congestion, rhinorrhea, ST, and ear pain. Cardiovascular: negative for chest pain or palpitations Respiratory: negative for hemoptysis, wheezing, shortness of breath, or cough Abdominal: negative for abdominal  pain, nausea, vomiting, diarrhea, or constipation Dermatological: negative for rash Neurologic: negative for headache, dizziness, or syncope All other systems reviewed and are otherwise negative with the exception to those above and in the HPI.  Physical Exam: Blood pressure 108/72, pulse 105, temperature 98.7 F (37.1 C), temperature source Oral, resp. rate 14, height 5' 2.75" (1.594 m), weight 273 lb (123.832 kg), SpO2 97 %., Body mass index is 48.74 kg/(m^2). General: Well developed, well nourished, in no acute distress. Head: Normocephalic, atraumatic, eyes without discharge, sclera non-icteric, nares are without discharge. Left ear is retracted and red. Right ear has a myringotomy tube. Throat is mildly erythematous.  Neck: Supple. No thyromegaly. Full ROM. No  lymphadenopathy. Lungs: Clear bilaterally to auscultation without wheezes, rales, or rhonchi. Breathing is unlabored. Heart: RRR with S1 S2. No murmurs, rubs, or gallops appreciated. Abdomen: Soft, non-tender, non-distended with normoactive bowel sounds. No hepatomegaly. No rebound/guarding. No obvious abdominal masses. Msk:  Strength and tone normal for age. Extremities/Skin: Warm and dry. No clubbing or cyanosis. No edema. No rashes or suspicious lesions. Neuro: Alert and oriented X 3. Moves all extremities spontaneously. Gait is normal. CNII-XII grossly in tact. Psych:  Responds to questions appropriately with a normal affect.   ASSESSMENT AND PLAN:  54 y.o. year old female with left otitis media and cough This chart was scribed in my presence and reviewed by me personally.    ICD-9-CM ICD-10-CM   1. Cough 786.2 R05 HYDROcodone-homatropine (HYCODAN) 5-1.5 MG/5ML syrup  2. Recurrent acute otitis media of left ear, unspecified otitis media type 382.9 H66.92 amoxicillin-clavulanate (AUGMENTIN) 875-125 MG tablet    By signing my name below, I, Nadim Abuhashem, attest that this documentation has been prepared under the direction and in the presence of Robyn Haber, MD.  Electronically Signed: Lora Havens, medical scribe. 07/18/2015 9:49 AM.

## 2015-07-20 NOTE — Telephone Encounter (Signed)
Called pt and advised message from provider on their voicemail.  

## 2015-07-20 NOTE — Telephone Encounter (Signed)
Please monitor this.  I could be the medicine.  If chest pain, shortness of breath, or continued pink sputum, please return for chest x-ray

## 2015-08-31 ENCOUNTER — Ambulatory Visit: Payer: Self-pay | Admitting: Physician Assistant

## 2015-10-13 ENCOUNTER — Ambulatory Visit: Payer: 59 | Admitting: Physician Assistant

## 2015-11-01 ENCOUNTER — Other Ambulatory Visit: Payer: Self-pay | Admitting: Physician Assistant

## 2015-11-03 NOTE — Telephone Encounter (Signed)
Patient needs DM follow up. Is she out of medications?

## 2015-12-26 ENCOUNTER — Other Ambulatory Visit: Payer: Self-pay | Admitting: Physician Assistant

## 2015-12-26 NOTE — Telephone Encounter (Signed)
Unable to leave message patient needs a follow up for refills

## 2016-03-18 ENCOUNTER — Ambulatory Visit (INDEPENDENT_AMBULATORY_CARE_PROVIDER_SITE_OTHER): Payer: 59 | Admitting: Obstetrics & Gynecology

## 2016-03-18 ENCOUNTER — Encounter: Payer: Self-pay | Admitting: Obstetrics & Gynecology

## 2016-03-18 VITALS — BP 126/70 | HR 90 | Ht 62.5 in | Wt 263.0 lb

## 2016-03-18 DIAGNOSIS — Z0000 Encounter for general adult medical examination without abnormal findings: Secondary | ICD-10-CM

## 2016-03-18 DIAGNOSIS — Z124 Encounter for screening for malignant neoplasm of cervix: Secondary | ICD-10-CM | POA: Diagnosis not present

## 2016-03-18 DIAGNOSIS — N951 Menopausal and female climacteric states: Secondary | ICD-10-CM | POA: Diagnosis not present

## 2016-03-18 DIAGNOSIS — E559 Vitamin D deficiency, unspecified: Secondary | ICD-10-CM

## 2016-03-18 DIAGNOSIS — E1165 Type 2 diabetes mellitus with hyperglycemia: Secondary | ICD-10-CM

## 2016-03-18 DIAGNOSIS — Z01419 Encounter for gynecological examination (general) (routine) without abnormal findings: Secondary | ICD-10-CM

## 2016-03-18 DIAGNOSIS — Z1389 Encounter for screening for other disorder: Secondary | ICD-10-CM

## 2016-03-18 LAB — POCT URINALYSIS DIPSTICK
BILIRUBIN UA: NEGATIVE
GLUCOSE UA: 1000
KETONES UA: NEGATIVE
NITRITE UA: NEGATIVE
Protein, UA: NEGATIVE
Urobilinogen, UA: NEGATIVE
pH, UA: 6

## 2016-03-18 LAB — CBC
HEMATOCRIT: 40.9 % (ref 35.0–45.0)
HEMOGLOBIN: 13.3 g/dL (ref 11.7–15.5)
MCH: 26.8 pg — AB (ref 27.0–33.0)
MCHC: 32.5 g/dL (ref 32.0–36.0)
MCV: 82.3 fL (ref 80.0–100.0)
MPV: 10.3 fL (ref 7.5–12.5)
Platelets: 208 10*3/uL (ref 140–400)
RBC: 4.97 MIL/uL (ref 3.80–5.10)
RDW: 14.9 % (ref 11.0–15.0)
WBC: 5.5 10*3/uL (ref 3.8–10.8)

## 2016-03-18 LAB — LIPID PANEL
Cholesterol: 165 mg/dL (ref 125–200)
HDL: 50 mg/dL (ref >=46)
LDL CALC: 99 mg/dL (ref <130)
TRIGLYCERIDES: 82 mg/dL (ref <150)
Total CHOL/HDL Ratio: 3.3 Ratio (ref <=5.0)
VLDL: 16 mg/dL (ref <30)

## 2016-03-18 LAB — COMPREHENSIVE METABOLIC PANEL
ALBUMIN: 4.1 g/dL (ref 3.6–5.1)
ALT: 22 U/L (ref 6–29)
AST: 14 U/L (ref 10–35)
Alkaline Phosphatase: 109 U/L (ref 33–130)
BUN: 10 mg/dL (ref 7–25)
CALCIUM: 10.6 mg/dL — AB (ref 8.6–10.4)
CHLORIDE: 104 mmol/L (ref 98–110)
CO2: 27 mmol/L (ref 20–31)
Creat: 0.99 mg/dL (ref 0.50–1.05)
Glucose, Bld: 277 mg/dL — ABNORMAL HIGH (ref 65–99)
POTASSIUM: 4.3 mmol/L (ref 3.5–5.3)
Sodium: 139 mmol/L (ref 135–146)
Total Bilirubin: 0.3 mg/dL (ref 0.2–1.2)
Total Protein: 7 g/dL (ref 6.1–8.1)

## 2016-03-18 LAB — TSH: TSH: 0.6 mIU/L

## 2016-03-18 MED ORDER — METFORMIN HCL ER 750 MG PO TB24: ORAL_TABLET | ORAL | 13 refills | Status: DC

## 2016-03-18 MED ORDER — LOSARTAN POTASSIUM 25 MG PO TABS: 25.0000 mg | ORAL_TABLET | Freq: Every day | ORAL | 13 refills | Status: DC

## 2016-03-18 NOTE — Patient Instructions (Addendum)
[facility], [place], [place] [address]  Phone: [phone]

## 2016-03-18 NOTE — Progress Notes (Addendum)
55 y.o. G1P1001 SingleAfrican AmericanF here for annual exam.  Reports her cycles have gotten much less frequent.  She went almost six months between cycles.  Had cycles in Nov, Jun, and August.  Flow is light and lasted 6 days.    Pt having trouble getting in with her PCP.  PCP is now working in urgent care and has limited schedule for "routine visits".  Pt hasn't taken any medication in the last month.  Reports she wouldn't do RFs until she was seen.  Would like to see endocrinologist.  Last hba1c 9.2.  Knows her HbA1C is elevated right now.  Patient's last menstrual period was 03/10/2016 (exact date).          Sexually active: No.  The current method of family planning is abstinence.    Exercising: No.  The patient does not participate in regular exercise at present. Smoker:  no  Health Maintenance: Pap:  10/22/13 Neg. HR HPV:neg  History of abnormal Pap:  yes MMG:  02/13/15 BIRADS1:neg. Pt will call to schedule Colonoscopy:  03/24/15 f/u 7 year on report but pt was told 10 years after pathology came back, Dr. Collene Mares BMD:   Never TDaP:  09/13/12  Pneumonia vaccine(s):  2010  Zostavax:   No Hep C testing: done today Screening Labs: Here, Hb today: 13.2, Urine today: Glucose= 1000, RBC=large, WBC=trace   reports that she has never smoked. She has never used smokeless tobacco. She reports that she does not drink alcohol or use drugs.  Past Medical History:  Diagnosis Date  . Allergy   . Anemia   . Asthma    3yrs ago  . Chronic deafness of both ears    left ear has 40% hearing-right ear has 60 % hearing  . Complication of anesthesia    Hard to wake up  . Diabetes mellitus without complication ([place])   . Dysmenorrhea   . Migraines    none in last 11 yrs  . Pelvic kidney    left  . Post partum depression   . Urinary incontinence     Past Surgical History:  Procedure Laterality Date  . CESAREAN SECTION    . COLONOSCOPY WITH PROPOFOL N/A 03/24/2015   Procedure: COLONOSCOPY WITH  PROPOFOL;  Surgeon: Juanita Craver, MD;  Location: [facility];  Service: Endoscopy;  Laterality: N/A;  . KNEE ARTHROSCOPY WITH MEDIAL MENISECTOMY Left 04/17/2014   Procedure: LEFT KNEE ARTHROSCOPY WITH PARTIAL MEDIAL MENISCECTOMY AND CHONDROPLASTY;  Surgeon: Johnn Hai, MD;  Location: [facility];  Service: Orthopedics;  Laterality: Left;  . TYMPANOSTOMY TUBE PLACEMENT      Current Outpatient Prescriptions  Medication Sig Dispense Refill  . cetirizine (ZYRTEC) 10 MG tablet Take 10 mg by mouth at bedtime.     Marland Kitchen glucose blood test strip Test blood sugar daily. E11.9 100 each 3  . losartan (COZAAR) 25 MG tablet TAKE 1 TABLET BY MOUTH DAILY. 15 tablet 0  . metFORMIN (GLUCOPHAGE-XR) 750 MG 24 hr tablet TAKE 1 TABLET (750 MG TOTAL) BY MOUTH 2 (TWO) TIMES DAILY. 30 tablet 0   No current facility-administered medications for this visit.     Family History  Problem Relation Age of Onset  . Hypertension Mother   . Arthritis Mother   . Other Sister     blood clot  . Cancer Sister   . Hypertension Sister   . Diabetes Maternal Grandmother   . Heart failure Maternal Grandmother   . Heart disease Maternal Grandmother   .  Hyperlipidemia Maternal Grandmother   . Hypertension Maternal Grandmother   . Hypertension Maternal Grandfather   . Hyperlipidemia Maternal Grandfather   . Breast cancer Paternal Aunt   . Cancer Paternal Grandfather     ROS:  Pertinent items are noted in HPI.  Otherwise, a comprehensive ROS was negative.  Exam:   BP 126/70 (BP Location: Right Arm, Patient Position: Sitting, Cuff Size: Large)   Pulse 90   Ht 5' 2.5" (1.588 m)   Wt 263 lb (119.3 kg)   LMP 03/10/2016 (Exact Date)   BMI 47.34 kg/m    Height: 5' 2.5" (158.8 cm)  Ht Readings from Last 3 Encounters:  03/18/16 5' 2.5" (1.588 m)  07/18/15 5' 2.75" (1.594 m)  05/25/15 5' 2.75" (1.594 m)    General appearance: alert, cooperative and appears stated age Head: Normocephalic, without obvious abnormality,  atraumatic Neck: no adenopathy, supple, symmetrical, trachea midline and thyroid normal to inspection and palpation Lungs: clear to auscultation bilaterally Breasts: normal appearance, no masses or tenderness Heart: regular rate and rhythm Abdomen: soft, non-tender; bowel sounds normal; no masses,  no organomegaly Extremities: extremities normal, atraumatic, no cyanosis or edema Skin: Skin color, texture, turgor normal. No rashes or lesions Lymph nodes: Cervical, supraclavicular, and axillary nodes normal. No abnormal inguinal nodes palpated Neurologic: Grossly normal   Pelvic: External genitalia:  no lesions              Urethra:  normal appearing urethra with no masses, tenderness or lesions              Bartholins and Skenes: normal                 Vagina: normal appearing vagina with normal color and discharge, no lesions              Cervix: no lesions              Pap taken: Yes.   Bimanual Exam:  Uterus:  normal size, contour, position, consistency, mobility, non-tender.  Cannot palpate pelvic kidney due to body habitus.              Adnexa: normal adnexa and no mass, fullness, tenderness               Rectovaginal: Confirms               Anus:  normal sphincter tone, no lesions  Chaperone was present for exam.  A:  Well Woman with normal exam H/O menorrhagia, now perimenopausal with episodes of amenorrhea H/O uterine fibroids H/O left pelvic kidney No SA, does not need contraception Morbid obesity with BMI 47.3 Diabetes type 2, likely with poor control due to being completley off medications for several weeks  P:                   Mammogram yearly.  Pt has abnormal follow last year and is back on yearly.  Knows this is due.   Pap obtained today.  H/O neg HR HPV 2015.   HbA1C FSH obtained today CBC, CMP, Lipids, TSH, Vit D today Names given for new PCP Referral made to endocrinology.  Rx's given for Glucophage XR 750mg  daily.  #30/12 RF and Losartan 25mg  daily.  #30/12  RF.  Did not RF Invokana due to lack of familiarity with this medication.   Return annually or prn   In additional to routine gyn exam, additional 10 minutes spent discussing diabetes, lack of access, need for specialist,  and need for improved control.

## 2016-03-19 LAB — VITAMIN D 25 HYDROXY (VIT D DEFICIENCY, FRACTURES): Vit D, 25-Hydroxy: 22 ng/mL — ABNORMAL LOW (ref 30–100)

## 2016-03-19 LAB — FOLLICLE STIMULATING HORMONE: FSH: 19.1 m[IU]/mL

## 2016-03-19 LAB — HEMOGLOBIN A1C
Hgb A1c MFr Bld: 10.9 % — ABNORMAL HIGH (ref <5.7)
Mean Plasma Glucose: 266 mg/dL

## 2016-03-21 LAB — IPS PAP TEST WITH REFLEX TO HPV

## 2016-03-21 LAB — HEMOGLOBIN, FINGERSTICK: HEMOGLOBIN, FINGERSTICK: 13.2 g/dL (ref 12.0–16.0)

## 2016-03-24 ENCOUNTER — Other Ambulatory Visit: Payer: Self-pay | Admitting: *Deleted

## 2016-03-24 DIAGNOSIS — N921 Excessive and frequent menstruation with irregular cycle: Secondary | ICD-10-CM

## 2016-03-24 DIAGNOSIS — N924 Excessive bleeding in the premenopausal period: Secondary | ICD-10-CM

## 2016-03-24 MED ORDER — VITAMIN D (ERGOCALCIFEROL) 1.25 MG (50000 UNIT) PO CAPS: 50000.0000 [IU] | ORAL_CAPSULE | ORAL | 1 refills | Status: DC

## 2016-03-24 NOTE — Addendum Note (Signed)
Addended by: Megan Salon on: 03/24/2016 10:07 AM   Modules accepted: Orders

## 2016-03-31 ENCOUNTER — Telehealth: Payer: Self-pay | Admitting: *Deleted

## 2016-03-31 DIAGNOSIS — R87618 Other abnormal cytological findings on specimens from cervix uteri: Secondary | ICD-10-CM

## 2016-03-31 NOTE — Telephone Encounter (Signed)
Call to patient. Left message to call back. Ask for Jill [name] or Jill [name] 

## 2016-03-31 NOTE — Telephone Encounter (Signed)
Spoke with patient. Advised of results and message as seen below from [place]. She is agreeable and verbalizes understanding. Patient reports no yeast infection symptoms at this time. No rx sent. SHGM and EMB scheduled for 04/07/2016 at 3 pm with 3:30 pm consult with Dr.Miller. Orders have been placed for precert. Patient is agreeable to date and time. Pre procedure instructions given.  Motrin instructions given. Motrin=Advil=Ibuprofen, 800 mg one hour before appointment. Eat a meal and hydrate well before appointment. 2 month lab recheck scheduled for 06/03/2016 at 3 pm. She is agreeable to date and time. Reports she is scheduled to see endocrinology Dr.Ellison on 04/27/2016 at 3:30 pm. Dr.Ellison will have access to labs in EPIC. Reports she will contact PCP recommended by Dr.Miller and schedule an appointment. Will contact the office when this has been scheduled so that labs can be sent to their office for appointment. She is agreeable.  Routing to provider for final review. Patient agreeable to disposition. Will close encounter.

## 2016-03-31 NOTE — Telephone Encounter (Signed)
-----   Message from Megan Salon, MD sent at 03/24/2016 10:00 AM EDT ----- Please let pt know her pap was normal.  However, endometrial cells were present on the pap.  She needs an endometrial biopsy and SHGM.  [place] still in perimenopausal range so some irregular bleeding is expected.  With the endometrial cells, however, additional evaluation is recommended. Yeast was also noted.  If symptomatic, ok to place order for diflucan 150mg  po x 1, repeat 72 hours.    CBC was normal.    CMP showed elevated glucose and HbA1C was 10.9.  I have already referred her to endocrinology and she was given names for new PCP.  CMP also showed mildly elevated calcium level.  This needs to be repeated.  Lastly, Vit D is 22.  She needs to be on prescription dosing and repeat in TWO months.  I'll repeat the calcium level at the same time.  I did place orders for repeat calcium and Vit D.  Orders for rx also placed.  Needs endo bx and [facility] scheduled.

## 2016-04-07 ENCOUNTER — Other Ambulatory Visit: Payer: Self-pay | Admitting: Obstetrics & Gynecology

## 2016-04-07 ENCOUNTER — Ambulatory Visit (INDEPENDENT_AMBULATORY_CARE_PROVIDER_SITE_OTHER): Payer: 59 | Admitting: Obstetrics & Gynecology

## 2016-04-07 ENCOUNTER — Ambulatory Visit (INDEPENDENT_AMBULATORY_CARE_PROVIDER_SITE_OTHER): Payer: 59

## 2016-04-07 ENCOUNTER — Encounter: Payer: Self-pay | Admitting: Obstetrics & Gynecology

## 2016-04-07 VITALS — BP 122/70 | HR 80 | Resp 20 | Ht 62.5 in | Wt 262.0 lb

## 2016-04-07 DIAGNOSIS — N921 Excessive and frequent menstruation with irregular cycle: Secondary | ICD-10-CM | POA: Diagnosis not present

## 2016-04-07 DIAGNOSIS — R87619 Unspecified abnormal cytological findings in specimens from cervix uteri: Secondary | ICD-10-CM

## 2016-04-07 DIAGNOSIS — N924 Excessive bleeding in the premenopausal period: Secondary | ICD-10-CM

## 2016-04-07 DIAGNOSIS — R8789 Other abnormal findings in specimens from female genital organs: Secondary | ICD-10-CM

## 2016-04-07 DIAGNOSIS — R87618 Other abnormal cytological findings on specimens from cervix uteri: Secondary | ICD-10-CM

## 2016-04-07 MED ORDER — NORETHINDRONE 0.35 MG PO TABS: 1.0000 | ORAL_TABLET | Freq: Every day | ORAL | 2 refills | Status: DC

## 2016-04-07 NOTE — Progress Notes (Signed)
55 y.o.Singlefemale here for a pelvic ultrasound with sonohystogram due to endometrial cells on pap, h/o uterine fibroids, and DUB.  Pt's [place] on 03/18/16 was 19.  It was 21 a year ago.  Patient's last menstrual period was 03/10/2016 (exact date).  Contraception: abstinance  Technique:  Both transabdominal and transvaginal ultrasound examinations of the pelvis were performed. Transabdominal technique was performed for global imaging of the pelvis including uterus, ovaries, adnexal regions, and pelvic cul-de-sac.  It was necessary to proceed with endovaginal exam following the abdominal ultrasound transabdominal exam to visualize the endometrium and adnexa.  Color and duplex Doppler ultrasound was utilized to evaluate blood flow to the ovaries.    FINDINGS: Uterus: 10.2 x 5.6 x 5.5cm with 1.8cm, 1.7cm, 1.2cm, 1.2cm intramural fibroids (stable from last PUS) Endometrium: 11.12mm Adnexa:  Left: 1.5 x 1.0 x 0.8cm     Right: 2.3 x 1.1 x 1.0cm Cul de sac: no free fluid Pelvic kidney again noted on PUS  SHSG:  After obtaining appropriate verbal consent from patient, the cervix was visualized using a speculum, and prepped with betadine.  A tenaculum  was applied to the cervix.  Dilation of the cervix was not necessary. The catheter was passed into the uterus and sterile saline introduced, with the following findings:  Endometrial biopsy recommended. Verbal and written consent obtained.  Speculum placed.  Cervix visualized and cleansed with betadine prep.  A single toothed tenaculum was applied to the anterior lip of the cervix.  Endometrial pipelle was advanced through the cervix into the endometrial cavity without difficulty.  Pipelle passed to 8.0cm.  Suction applied and pipelle removed with good tissue sample obtained.  Tenculum removed.  No bleeding noted.  Patient tolerated procedure well.  All instruments removed.   Assessment:  DUB Mildly elevated FSH Fibroid uterus  Plan:   Endometrial biopsy  pending.  As long as pathology is normal, pt will start micronor to see if this will control bleeding better.  As her [place] is mildly elevated, hopefully this will allow her to proceed into menopause which should resolve bleeding issues without any surgical procedures (which she would like to avoid). Of note, pt is back on her diabetes medication and does have endocrinology appt next month.  ~25 minutes spent with patient >50% of time was in face to face discussion of above.

## 2016-04-12 ENCOUNTER — Telehealth: Payer: Self-pay | Admitting: Obstetrics & Gynecology

## 2016-04-12 MED ORDER — FLUCONAZOLE 150 MG PO TABS: 150.0000 mg | ORAL_TABLET | Freq: Once | ORAL | 0 refills | Status: AC

## 2016-04-12 NOTE — Telephone Encounter (Signed)
Left message, per DPR, letting patient know prescription for diflucan had been sent to her pharmacy on file. Instructions on use given. Instructed patient to call office with any questions.    Routing to provider for final review. Patient agreeable to disposition. Will close encounter.    Cc Dr. Sabra Heck

## 2016-04-12 NOTE — Telephone Encounter (Signed)
Spoke with patient advised of note as seen below from Dr. Talbert Nan. Patient is agreeable and verbalizes understanding.   Cc: Dr. Sabra Heck Routing to covering provider for final review. Patient is agreeable to disposition. Will close encounter.

## 2016-04-12 NOTE — Telephone Encounter (Signed)
She had yeast on her pap, diflucan script sent to the pharmacy. Please inform the patient.

## 2016-04-12 NOTE — Telephone Encounter (Signed)
Patient thinks she may have a yeast infection and was told by Dr. Sabra Heck to call for a prescription if that happened after the procedure from last week. [facility] [address] at [phone].

## 2016-04-12 NOTE — Telephone Encounter (Signed)
Dr. Talbert Nan,   Please advise.  Routing to provider for review.   Cc Dr. Sabra Heck

## 2016-04-27 ENCOUNTER — Ambulatory Visit: Payer: 59 | Admitting: Endocrinology

## 2016-05-02 ENCOUNTER — Ambulatory Visit (INDEPENDENT_AMBULATORY_CARE_PROVIDER_SITE_OTHER): Payer: 59 | Admitting: Obstetrics & Gynecology

## 2016-05-02 ENCOUNTER — Encounter: Payer: Self-pay | Admitting: Obstetrics & Gynecology

## 2016-05-02 ENCOUNTER — Telehealth: Payer: Self-pay | Admitting: Obstetrics & Gynecology

## 2016-05-02 VITALS — BP 128/90 | HR 88 | Resp 20 | Ht 62.0 in | Wt 266.2 lb

## 2016-05-02 DIAGNOSIS — D251 Intramural leiomyoma of uterus: Secondary | ICD-10-CM

## 2016-05-02 DIAGNOSIS — Z30014 Encounter for initial prescription of intrauterine contraceptive device: Secondary | ICD-10-CM

## 2016-05-02 DIAGNOSIS — N921 Excessive and frequent menstruation with irregular cycle: Secondary | ICD-10-CM | POA: Diagnosis not present

## 2016-05-02 DIAGNOSIS — E119 Type 2 diabetes mellitus without complications: Secondary | ICD-10-CM

## 2016-05-02 NOTE — Telephone Encounter (Signed)
Patient was seen in the office on 04/07/2016 for [facility] and EMB due to endometrial cells seen on her pap smear. Patient has a history of uterine fibroids and DUB. Please see SHGM results below. EMB was negative. Patient was started on Mirconor on 04/13/2016. Spoke with patient. Patient states on Saturday she began to have light bleeding. On Sunday bleeding increased to a light flow. Today 05/02/2016 the patient woke up with severe cramping and heavy bleeding. Reports she is wearing an overnight pad and a super tampon that she is changing every 2 hours. Patient took 600 mg of Ibuprofen which relieved her cramping for 2-3 hours. Reports intermittent sharp pain on her right side. Denies any nausea, vomiting, or fever. Unable to tell RN if she is dizzy or weak as she has been laying down most of the morning. Denies SOB. This is her 3rd week on Micronor and she is taking her pills daily at 10:30 pm without missing any pills. Reports passing "a lot" of dime sized clots. Advised patient she will need to be seen in the office for further evaluation. Patient declines at this time due to financial concerns. Advised with her increased bleeding and pain assessment is important. Patient requests I speak with Dr.Miller and return call with recommendations. Aware Dr.Miller is in surgery this morning.

## 2016-05-02 NOTE — Telephone Encounter (Signed)
Spoke with patient. Advised I have spoken with Dr.Miller who recommends that she be seen in the office today for evaluation. Patient is agreeable and verbalizes understanding. Appointment scheduled for today at 3:30 pm with Dr.Miller. Patient is agreeable to date and time.  Routing to provider for final review. Patient agreeable to disposition. Will close encounter.

## 2016-05-02 NOTE — Telephone Encounter (Signed)
Patient called and said, "I am taking birth control pills to help control my cycles. It is not working because I started bleeding yesterday and it is extremely heavy. I am bleeding through a super tampon and a pad through to my pants. I couldn't make it to work today." Patient requests an appointment.  Last seen 04/07/16

## 2016-05-03 DIAGNOSIS — D251 Intramural leiomyoma of uterus: Secondary | ICD-10-CM | POA: Insufficient documentation

## 2016-05-03 DIAGNOSIS — N921 Excessive and frequent menstruation with irregular cycle: Secondary | ICD-10-CM | POA: Insufficient documentation

## 2016-05-03 MED ORDER — MEDROXYPROGESTERONE ACETATE 10 MG PO TABS: ORAL_TABLET | ORAL | 0 refills | Status: DC

## 2016-05-03 NOTE — Addendum Note (Signed)
Addended by: Megan Salon on: 05/03/2016 06:59 AM   Modules accepted: Orders

## 2016-05-03 NOTE — Progress Notes (Signed)
GYNECOLOGY  VISIT   HPI: 55 y.o. G73P1001 Single African American female with complaint of heavy vaginal bleeding that started overnight and persisted into the morning.  She states wearing a tampon and pad were the only things that kept her from soiling clothes.  The clots were just "dropping out".  She reports having some light spotting on thurs and Friday last week.  Then the bleeding increased to more like a period on Sat and Sun.  Then this morning, bleeding became very heavy.  Denies SOB, palpitations, or dizziness.  She reports bleeding has really decreased since she called this morning.  She is still passing clots but they are much smaller now and flow has decreased as well.  At pt's [facility] on 8/11, irregular cycles were noted.  She'd skipped up to six months over the past year.  Her pap was negative but showed endometrial cells.  Therefore, ultrasound and endometrial biopsy were recommended.  [place] was 19 at this time.  Endometrial biopsy was negative and she was started on micronor.  She'd been on this for about 3 weeks before the bleeding began.  Fingerstick Hb 13.6 today in office.  Pt has not seen endocrinologist yet.  Has appt 10/5.  Pt is back taking her metformin 750mg  XR but reports it is causing stomach pains and she didn't take it today with all the bleeding she was having.  Really hopes this will be changed due to side effects.  HbA1C was 10.9 on 03/18/16.  GYNECOLOGIC HISTORY: Patient's last menstrual period was 04/29/2016. Contraception: micronor  Patient Active Problem List   Diagnosis Date Noted  . Pelvic kidney   . Diabetes mellitus without complication ([place])   . OBESITY, MORBID 07/30/2009  . DYSPNEA ON EXERTION 07/30/2009  . DIABETES MELLITUS, TYPE II, UNCONTROLLED 05/19/2009  . OBSTRUCTIVE SLEEP APNEA 05/19/2009  . FATIGUE 05/19/2009  . SYMPTOM, DISTURBANCE, SLEEP NOS 06/01/2007  . MOTION SICKNESS 03/12/2007  . ASTHMA 01/31/2007  . GERD 01/31/2007  . MIGRAINE, MENSTRUAL  01/31/2007    Past Medical History:  Diagnosis Date  . Allergy   . Anemia   . Asthma    26yrs ago  . Chronic deafness of both ears    left ear has 40% hearing-right ear has 60 % hearing  . Complication of anesthesia    Hard to wake up  . Diabetes mellitus without complication ([place])   . Dysmenorrhea   . Migraines    none in last 11 yrs  . Pelvic kidney    left  . Post partum depression   . Urinary incontinence     Past Surgical History:  Procedure Laterality Date  . CESAREAN SECTION    . COLONOSCOPY WITH PROPOFOL N/A 03/24/2015   Procedure: COLONOSCOPY WITH PROPOFOL;  Surgeon: Juanita Craver, MD;  Location: [facility];  Service: Endoscopy;  Laterality: N/A;  . KNEE ARTHROSCOPY WITH MEDIAL MENISECTOMY Left 04/17/2014   Procedure: LEFT KNEE ARTHROSCOPY WITH PARTIAL MEDIAL MENISCECTOMY AND CHONDROPLASTY;  Surgeon: Johnn Hai, MD;  Location: [facility];  Service: Orthopedics;  Laterality: Left;  . TYMPANOSTOMY TUBE PLACEMENT      MEDS:  Reviewed in EPIC and UTD  ALLERGIES: Adhesive [tape]; Latex; Toradol [ketorolac tromethamine]; and Tramadol  Family History  Problem Relation Age of Onset  . Hypertension Mother   . Arthritis Mother   . Other Sister     blood clot  . Cancer Sister   . Hypertension Sister   . Diabetes Maternal Grandmother   . Heart  failure Maternal Grandmother   . Heart disease Maternal Grandmother   . Hyperlipidemia Maternal Grandmother   . Hypertension Maternal Grandmother   . Hypertension Maternal Grandfather   . Hyperlipidemia Maternal Grandfather   . Breast cancer Paternal Aunt   . Cancer Paternal Grandfather     SH:  Non smoker, single  Review of Systems  Gastrointestinal: Positive for abdominal pain (consistent with cramping).  All other systems reviewed and are negative.   PHYSICAL EXAMINATION:    BP 128/90 (BP Location: Right Arm, Patient Position: Sitting, Cuff Size: Large)   Pulse 88   Resp 20   Ht 5\' 2"  (1.575 m)   Wt 266 lb 3.2  oz (120.7 kg)   LMP 04/29/2016   BMI 48.69 kg/m      General appearance: alert, cooperative and appears stated age CV:  Regular rate and rhythm Lungs:  clear to auscultation, no wheezes, rales or rhonchi, symmetric air entry Abdomen: soft, non-tender; bowel sounds normal; no masses,  no organomegaly  Pelvic: External genitalia:  no lesions              Urethra:  normal appearing urethra with no masses, tenderness or lesions              Bartholins and Skenes: normal                 Vagina: normal appearing vagina with normal color and discharge, no lesions              Cervix: no lesions and blood present with some clots in vagina              Bimanual Exam:  Uterus:  enlarged, 8-10  weeks size              Adnexa: no mass, fullness, tenderness              Anus:  normal sphincter tone, no lesions  Discussed with pt options for treatment including adding additional progesterone, Depo Provera, Mirena IUD use.  Do not feel should change to oral OCP containing estrogen due to age, obesity, and poorly controlled diabetes.  D&C and hysterectomy reviewed as well.  She is not interested in any surgical procedure at this point.  Today, pt most interested in Mirena IUD placement.  Procedure:  Speculum placed.  Cervix cleansed wth Betadine x 3.  Single toothed tenaculum applied to anterior lip of cervix.  Attempt made to pass uterine sound.  This was unsuccessful.  Milex dilator passed without difficulty.  Several additional attempts made at passage of uterine sound.  This was unsuccessful several times.  due to risks of perforation (esp in pt with a pelvic kidney), procedure was ended.  Pt tolerated this well.  Decision made to then use oral progesterone.  Pt comfortable with plan.  Chaperone was present for exam.  Assessment: Menorrhagia Desires contraception if micronor is stopped DUB Known hx of fibroids Unsuccessful IUD placement today DM, poorly controlled  Plan: Pt will start Provera  10mg  BID until bleeding stops and then decreased to daily.  She will continue her micronor with this.  She will give update tomorrow regarding bleeding.  If she has another cycle like this, will attempt IUD placement with ultrasound guidance.  Pt comfortable with plan.

## 2016-05-10 ENCOUNTER — Other Ambulatory Visit: Payer: Self-pay | Admitting: Obstetrics & Gynecology

## 2016-05-10 ENCOUNTER — Telehealth: Payer: Self-pay | Admitting: Obstetrics & Gynecology

## 2016-05-10 ENCOUNTER — Telehealth: Payer: Self-pay | Admitting: *Deleted

## 2016-05-10 DIAGNOSIS — N938 Other specified abnormal uterine and vaginal bleeding: Secondary | ICD-10-CM

## 2016-05-10 DIAGNOSIS — Z30430 Encounter for insertion of intrauterine contraceptive device: Secondary | ICD-10-CM

## 2016-05-10 MED ORDER — MEDROXYPROGESTERONE ACETATE 10 MG PO TABS: ORAL_TABLET | ORAL | 0 refills | Status: DC

## 2016-05-10 NOTE — Telephone Encounter (Signed)
OK to increase to BID.  Rx sent to the pharmacy.  I would really like to try placing IUD under ultrasound guidance.  Do not need to precert.  If pt is ok with this, please see if can get scheduled.  Thanks.

## 2016-05-10 NOTE — Progress Notes (Signed)
Rx sent for provera for 10mg  bid.  Will try to set up ultrasound guided IUD placement.

## 2016-05-10 NOTE — Telephone Encounter (Signed)
Spoke with patient. Advised as seen below per Dr. Sabra Heck. Patient scheduled for PUS for Mirena IUD placement 05/12/16 at 1:00pm. Advised patient to take 800mg  Motrin 1 hour prior to procedure. Patient verbalizes understanding and is agreeable to date and time.     Routing to provider for final review. Patient is agreeable to disposition. Will close encounter.      Per Dr. Sabra Heck, no precert required

## 2016-05-10 NOTE — Telephone Encounter (Signed)
Patient called requesting to speak with the nurse about her medications to regulate her menstrual cycles.

## 2016-05-10 NOTE — Telephone Encounter (Signed)
Spoke with patient. Patient calling to give update on bleeding since 05/02/16 OV. Patient states she started Provera BID 05/04/16; bleeding stopped 10/1, started 1 daily as suggested. Bleeding started 10/3.  Patient states when she drops to 1 daily the heavy bleeding starts again. Patient currently changing 1 super plus tampon and overnight pad q4h. Patient would like to know if she needs to go back to BID? And if so, she request refill. Patient reports she is still taking Micronor. Advised patient I would need to review with Dr. Sabra Heck and return call with recommendations. Patient is agreeable.    Dr. Sabra Heck, please advise on Provera?

## 2016-05-11 ENCOUNTER — Other Ambulatory Visit: Payer: Self-pay | Admitting: *Deleted

## 2016-05-11 DIAGNOSIS — Z30430 Encounter for insertion of intrauterine contraceptive device: Secondary | ICD-10-CM

## 2016-05-12 ENCOUNTER — Encounter: Payer: Self-pay | Admitting: Obstetrics & Gynecology

## 2016-05-12 ENCOUNTER — Ambulatory Visit (INDEPENDENT_AMBULATORY_CARE_PROVIDER_SITE_OTHER): Payer: 59 | Admitting: Endocrinology

## 2016-05-12 ENCOUNTER — Other Ambulatory Visit: Payer: Self-pay | Admitting: Obstetrics & Gynecology

## 2016-05-12 ENCOUNTER — Ambulatory Visit (INDEPENDENT_AMBULATORY_CARE_PROVIDER_SITE_OTHER): Payer: 59 | Admitting: Obstetrics & Gynecology

## 2016-05-12 ENCOUNTER — Encounter: Payer: Self-pay | Admitting: Endocrinology

## 2016-05-12 ENCOUNTER — Ambulatory Visit (INDEPENDENT_AMBULATORY_CARE_PROVIDER_SITE_OTHER): Payer: 59

## 2016-05-12 VITALS — BP 126/72 | HR 96 | Ht 62.5 in | Wt 268.0 lb

## 2016-05-12 DIAGNOSIS — Z30430 Encounter for insertion of intrauterine contraceptive device: Secondary | ICD-10-CM | POA: Diagnosis not present

## 2016-05-12 DIAGNOSIS — N921 Excessive and frequent menstruation with irregular cycle: Secondary | ICD-10-CM

## 2016-05-12 DIAGNOSIS — N938 Other specified abnormal uterine and vaginal bleeding: Secondary | ICD-10-CM

## 2016-05-12 DIAGNOSIS — E119 Type 2 diabetes mellitus without complications: Secondary | ICD-10-CM | POA: Diagnosis not present

## 2016-05-12 MED ORDER — EMPAGLIFLOZIN 25 MG PO TABS: 25.0000 mg | ORAL_TABLET | Freq: Every day | ORAL | 11 refills | Status: DC

## 2016-05-12 MED ORDER — GLUCOSE BLOOD VI STRP: 1.0000 | ORAL_STRIP | Freq: Every day | 12 refills | Status: DC

## 2016-05-12 MED ORDER — METFORMIN HCL ER 750 MG PO TB24
750.0000 mg | ORAL_TABLET | Freq: Every day | ORAL | 13 refills | Status: DC
Start: 2016-05-12 — End: 2016-06-03

## 2016-05-12 NOTE — Progress Notes (Deleted)
GYNECOLOGY  VISIT   HPI: 55 y.o. G1P1001 Single {Race/ethnicity:17218} female with Patient's last menstrual period was 04/29/2016.   here for ***.  GYNECOLOGIC HISTORY: Patient's last menstrual period was 04/29/2016. Contraception: *** Menopausal hormone therapy: ***  Patient Active Problem List   Diagnosis Date Noted  . Menorrhagia with irregular cycle 05/03/2016  . Fibroids, intramural 05/03/2016  . Pelvic kidney   . Diabetes mellitus without complication ([place])   . OBESITY, MORBID 07/30/2009  . DYSPNEA ON EXERTION 07/30/2009  . DIABETES MELLITUS, TYPE II, UNCONTROLLED 05/19/2009  . OBSTRUCTIVE SLEEP APNEA 05/19/2009  . FATIGUE 05/19/2009  . SYMPTOM, DISTURBANCE, SLEEP NOS 06/01/2007  . MOTION SICKNESS 03/12/2007  . ASTHMA 01/31/2007  . GERD 01/31/2007  . MIGRAINE, MENSTRUAL 01/31/2007    Past Medical History:  Diagnosis Date  . Allergy   . Anemia   . Asthma    79yrs ago  . Chronic deafness of both ears    left ear has 40% hearing-right ear has 60 % hearing  . Complication of anesthesia    Hard to wake up  . Diabetes mellitus without complication ([place])   . Dysmenorrhea   . Migraines    none in last 11 yrs  . Pelvic kidney    left  . Post partum depression   . Urinary incontinence     Past Surgical History:  Procedure Laterality Date  . CESAREAN SECTION    . COLONOSCOPY WITH PROPOFOL N/A 03/24/2015   Procedure: COLONOSCOPY WITH PROPOFOL;  Surgeon: Juanita Craver, MD;  Location: [facility];  Service: Endoscopy;  Laterality: N/A;  . KNEE ARTHROSCOPY WITH MEDIAL MENISECTOMY Left 04/17/2014   Procedure: LEFT KNEE ARTHROSCOPY WITH PARTIAL MEDIAL MENISCECTOMY AND CHONDROPLASTY;  Surgeon: Johnn Hai, MD;  Location: [facility];  Service: Orthopedics;  Laterality: Left;  . TYMPANOSTOMY TUBE PLACEMENT      MEDS:  Reviewed in EPIC and UTD  ALLERGIES: Adhesive [tape]; Latex; Toradol [ketorolac tromethamine]; and Tramadol  Family History  Problem Relation Age of Onset   . Hypertension Mother   . Arthritis Mother   . Other Sister     blood clot  . Cancer Sister   . Hypertension Sister   . Diabetes Maternal Grandmother   . Heart failure Maternal Grandmother   . Heart disease Maternal Grandmother   . Hyperlipidemia Maternal Grandmother   . Hypertension Maternal Grandmother   . Hypertension Maternal Grandfather   . Hyperlipidemia Maternal Grandfather   . Breast cancer Paternal Aunt   . Cancer Paternal Grandfather     SH:  ***  ROS  PHYSICAL EXAMINATION:    LMP 04/29/2016     General appearance: alert, cooperative and appears stated age Neck: no adenopathy, supple, symmetrical, trachea midline and thyroid {CHL AMB PHY EX THYROID NORM DEFAULT:[phone]::"normal to inspection and palpation"} CV:  {Exam; heart brief:31539} Lungs:  {pe lungs ob:314451::"clear to auscultation, no wheezes, rales or rhonchi, symmetric air entry"} Breasts: {Exam; breast:13139::"normal appearance, no masses or tenderness"} Abdomen: soft, non-tender; bowel sounds normal; no masses,  no organomegaly  Pelvic: External genitalia:  no lesions              Urethra:  normal appearing urethra with no masses, tenderness or lesions              Bartholins and Skenes: normal                 Vagina: normal appearing vagina with normal color and discharge, no lesions  Cervix: {CHL AMB PHY EX CERVIX NORM DEFAULT:[phone]::"no lesions"}              Bimanual Exam:  Uterus:  {CHL AMB PHY EX UTERUS NORM [phone]::"normal size, contour, position, consistency, mobility, non-tender"}              Adnexa: {CHL AMB PHY EX ADNEXA NO MASS [phone]::"no mass, fullness, tenderness"}              Rectovaginal: {yes no:314532}.  Confirms.              Anus:  normal sphincter tone, no lesions  Chaperone was present for exam.  Assessment:   ***  Plan: ***   ~{NUMBERS; -10-45 JOINT ROM:10287} minutes spent with patient >50% of time was in face to face  discussion of above.

## 2016-05-12 NOTE — Progress Notes (Signed)
Subjective:    Patient ID: Jill [name], female    DOB: 1960/11/12, 55 y.o.   MRN: [id number]  HPI pt states DM was dx'ed in 2006; she has mild if any neuropathy of the lower extremities; she is unaware of any associated chronic complications; she has never been on insulin; pt says his diet and exercise are good; she has never had GDM, pancreatitis, severe hypoglycemia or DKA.  She does not check cbg's.  She attributes intermittent abd pain to metformin.    Past Medical History:  Diagnosis Date  . Allergy   . Anemia   . Asthma    12yrs ago  . Chronic deafness of both ears    left ear has 40% hearing-right ear has 60 % hearing  . Complication of anesthesia    Hard to wake up  . Diabetes mellitus without complication ([place])   . Dysmenorrhea   . Migraines    none in last 11 yrs  . Pelvic kidney    left  . Post partum depression   . Urinary incontinence     Past Surgical History:  Procedure Laterality Date  . CESAREAN SECTION    . COLONOSCOPY WITH PROPOFOL N/A 03/24/2015   Procedure: COLONOSCOPY WITH PROPOFOL;  Surgeon: Juanita Craver, MD;  [facility];  Service: Endoscopy;  Laterality: N/A;  . KNEE ARTHROSCOPY WITH MEDIAL MENISECTOMY Left 04/17/2014   Procedure: LEFT KNEE ARTHROSCOPY WITH PARTIAL MEDIAL MENISCECTOMY AND CHONDROPLASTY;  Surgeon: Johnn Hai, MD;  Location[facility];  Service: Orthopedics;  Laterality: Left;  . TYMPANOSTOMY TUBE PLACEMENT      Social History   Social History  . Marital status: Single    Spouse name: N/A  . Number of children: N/A  . Years of education: N/A   Occupational History  . social services case worker   .  [place] History Main Topics  . Smoking status: Never Smoker  . Smokeless tobacco: Never Used  . Alcohol use No  . Drug use: No  . Sexual activity: No   Other Topics Concern  . Not on file   Social History Narrative   Single. Education: [employer].     Current Outpatient Prescriptions on  File Prior to Visit  Medication Sig Dispense Refill  . glucose blood test strip Test blood sugar daily. E11.9 100 each 3  . losartan (COZAAR) 25 MG tablet Take 1 tablet (25 mg total) by mouth daily. 30 tablet 13  . medroxyPROGESTERone (PROVERA) 10 MG tablet Take 1 tab BID until bleeding stops then decreased to daily for next 7 days. 60 tablet 0  . Vitamin D, Ergocalciferol, (DRISDOL) 50000 units CAPS capsule Take 1 capsule (50,000 Units total) by mouth every 7 (seven) days. 4 capsule 1   No current facility-administered medications on file prior to visit.     Allergies  Allergen Reactions  . Adhesive [Tape]     Pulls skin off  . Latex Itching  . Toradol [Ketorolac Tromethamine]     Joint pain and swelling  . Tramadol Other (See Comments)    JOINT PAIN    Family History  Problem Relation Age of Onset  . Hypertension Mother   . Arthritis Mother   . Other Sister     blood clot  . Cancer Sister   . Hypertension Sister   . Diabetes Maternal Grandmother   . Heart failure Maternal Grandmother   . Heart disease Maternal Grandmother   . Hyperlipidemia Maternal Grandmother   .  Hypertension Maternal Grandmother   . Hypertension Maternal Grandfather   . Hyperlipidemia Maternal Grandfather   . Breast cancer Paternal Aunt   . Cancer Paternal Grandfather     BP 126/72   Pulse 96   Ht 5' 2.5" (1.588 m)   Wt 268 lb (121.6 kg)   LMP 04/29/2016   SpO2 97%   BMI 48.24 kg/m    Review of Systems denies weight loss, blurry vision, headache, chest pain, sob, n/v, urinary frequency, muscle cramps, excessive diaphoresis, depression, cold intolerance, rhinorrhea, and easy bruising.       Objective:   Physical Exam VS: see vs page GEN: no distress HEAD: head: no deformity eyes: no periorbital swelling, no proptosis external nose and ears are normal mouth: no lesion seen NECK: supple, thyroid is not enlarged CHEST WALL: no deformity LUNGS: clear to auscultation. CV: reg rate and  rhythm, no murmur.   ABD: abdomen is soft, nontender.  no hepatosplenomegaly.  not distended.  no hernia MUSCULOSKELETAL: muscle bulk and strength are grossly normal.  no obvious joint swelling.  gait is normal and steady EXTEMITIES: no deformity.  no ulcer on the feet.  feet are of normal color and temp.  1+ bilat leg edema.  There is bilateral onychomycosis of the toenails PULSES: dorsalis pedis intact bilat.  no carotid bruit NEURO:  cn 2-12 grossly intact.   readily moves all 4's.  sensation is intact to touch on the feet SKIN:  Normal texture and temperature.  No rash or suspicious lesion is visible.  Hyperpigmentation of the legs and feet NODES:  None palpable at the neck PSYCH: alert, well-oriented.  Does not appear anxious nor depressed.   Lab Results  Component Value Date   HGBA1C 10.9 (H) 03/18/2016   Lab Results  Component Value Date   CREATININE 0.99 03/18/2016   BUN 10 03/18/2016   NA 139 03/18/2016   K 4.3 03/18/2016   CL 104 03/18/2016   CO2 27 03/18/2016   i personally reviewed electrocardiogram tracing (09/10/14): Indication: HTN Impression: normal  I have reviewed outside records, and summarized: Pt was recently seen for DUB, and IUD was inserted.  She was noted to have severely elevated a1c, and referred here.     Assessment & Plan:  Morbid obesity, new to me: I advised surgery, but she declines.  Type 2 DM: severe exacerbation: she declines insulin abd pain, apparently due to metformin.  Edema: this precludes pioglitizone rx. Patient is advised the following: Patient Instructions  good diet and exercise significantly improve the control of your diabetes.  please let me know if you wish to be referred to a dietician.  high blood sugar is very risky to your health.  you should see an eye doctor and dentist every year.  It is very important to get all recommended vaccinations.  Controlling your blood pressure and cholesterol drastically reduces the damage  diabetes does to your body.  Those who smoke should quit.  Please discuss these with your doctor.  check your blood sugar a day.  vary the time of day when you check, between before the 3 meals, and at bedtime.  also check if you have symptoms of your blood sugar being too high or too low.  please keep a record of the readings and bring it to your next appointment here (or you can bring the meter itself).  You can write it on any piece of paper.  please call us sooner if your blood sugar goes below  70, or if you have a lot of readings over 200. For now, please: Reduce the metformin, and: Add "jardiance," and: Please or message [place] next week to tell us how the blood sugar is doing.  If it is still high, we can add "tradjenta."  Please come back for a follow-up appointment in 2-3 weeks.

## 2016-05-12 NOTE — Patient Instructions (Signed)
It is ok to finish the [place] and then stop it.

## 2016-05-12 NOTE — Patient Instructions (Addendum)
good diet and exercise significantly improve the control of your diabetes.  please let me know if you wish to be referred to a dietician.  high blood sugar is very risky to your health.  you should see an eye doctor and dentist every year.  It is very important to get all recommended vaccinations.  Controlling your blood pressure and cholesterol drastically reduces the damage diabetes does to your body.  Those who smoke should quit.  Please discuss these with your doctor.  check your blood sugar a day.  vary the time of day when you check, between before the 3 meals, and at bedtime.  also check if you have symptoms of your blood sugar being too high or too low.  please keep a record of the readings and bring it to your next appointment here (or you can bring the meter itself).  You can write it on any piece of paper.  please call us sooner if your blood sugar goes below 70, or if you have a lot of readings over 200. For now, please: Reduce the metformin, and: Add "jardiance," and: Please or message [place] next week to tell us how the blood sugar is doing.  If it is still high, we can add "tradjenta."  Please come back for a follow-up appointment in 2-3 weeks.

## 2016-05-13 ENCOUNTER — Telehealth: Payer: Self-pay | Admitting: Endocrinology

## 2016-05-13 NOTE — Telephone Encounter (Signed)
Patient is calling about the direction of he medication . Please advise

## 2016-05-13 NOTE — Telephone Encounter (Signed)
Requested a call back from the patient to discuss her questions.

## 2016-05-17 ENCOUNTER — Encounter: Payer: Self-pay | Admitting: Obstetrics & Gynecology

## 2016-05-17 NOTE — Progress Notes (Signed)
55 y.o. G1P1001 Single African American female here for pelvic ultrasound and ultrasound guided IUD placement.  Pt has hx of uterine fibroids and menorrhagia with irregular cycles.  She also has hx of diabetes and poor control and elevated hbA1C.  She is seeing Dr. Loanne Drilling this afternoon.  Pt had attempted placement of IUD on 05/02/16 but this was not successful.  She was placed on provera which has helped her bleeding but not stopped it.  She would like to attempt IUD placement again today.  She did have an endometrial biopsy on 04/07/16 showing: Endometrium, biopsy - SCANT BENIGN ENDOMETRIUM. - NO HYPERPLASIA OR MALIGNANCY.  LMP:  Patient's last menstrual period was 04/29/2016.  Patient Active Problem List   Diagnosis Date Noted  . Menorrhagia with irregular cycle 05/03/2016  . Fibroids, intramural 05/03/2016  . Pelvic kidney   . Diabetes mellitus without complication ([place])   . OBESITY, MORBID 07/30/2009  . DYSPNEA ON EXERTION 07/30/2009  . Diabetes ([place]) 05/19/2009  . OBSTRUCTIVE SLEEP APNEA 05/19/2009  . FATIGUE 05/19/2009  . SYMPTOM, DISTURBANCE, SLEEP NOS 06/01/2007  . MOTION SICKNESS 03/12/2007  . ASTHMA 01/31/2007  . GERD 01/31/2007  . MIGRAINE, MENSTRUAL 01/31/2007   Past Medical History:  Diagnosis Date  . Allergy   . Anemia   . Asthma    62yrs ago  . Chronic deafness of both ears    left ear has 40% hearing-right ear has 60 % hearing  . Complication of anesthesia    Hard to wake up  . Diabetes mellitus without complication ([place])   . Dysmenorrhea   . Migraines    none in last 11 yrs  . Pelvic kidney    left  . Post partum depression   . Urinary incontinence    Current Outpatient Prescriptions on File Prior to Visit  Medication Sig Dispense Refill  . empagliflozin (JARDIANCE) 25 MG TABS tablet Take 25 mg by mouth daily. 30 tablet 11  . glucose blood (ONETOUCH VERIO) test strip 1 each by Other route daily. And lancets 1/day 100 each 12  . glucose blood test  strip Test blood sugar daily. E11.9 100 each 3  . losartan (COZAAR) 25 MG tablet Take 1 tablet (25 mg total) by mouth daily. 30 tablet 13  . medroxyPROGESTERone (PROVERA) 10 MG tablet Take 1 tab BID until bleeding stops then decreased to daily for next 7 days. 60 tablet 0  . metFORMIN (GLUCOPHAGE-XR) 750 MG 24 hr tablet Take 1 tablet (750 mg total) by mouth daily with breakfast. TAKE 1 TABLET (750 MG TOTAL) BY MOUTH 2 (TWO) TIMES DAILY. 30 tablet 13  . Vitamin D, Ergocalciferol, (DRISDOL) 50000 units CAPS capsule Take 1 capsule (50,000 Units total) by mouth every 7 (seven) days. 4 capsule 1   No current facility-administered medications on file prior to visit.    Adhesive [tape]; Latex; Toradol [ketorolac tromethamine]; and Tramadol  Review of Systems  All other systems reviewed and are negative.    Gen:  WNWF healthy female NAD Abdomen: soft, non-tender Groin:  no inguinal nodes palpated  Pelvic exam: Vulva:  normal female genitalia Vagina:  normal vagina, no discharge, exudate, lesion, or erythema Cervix:  Non-tender, Negative CMT, no lesions or redness. Uterus:  About 10 weeks size and globular consistent with fibroids  Procedure:  Speculum reinserted.  Cervix visualized and cleansed with Betadine x 3.  Paracervical block was not placed.  Single toothed tenaculum applied to anterior lip of cervix without difficulty.  Uterus sounded to 9cm.  Mirena IUD package was opened.  IUD and introducer passed to fundus and then withdrawn slightly before IUD was passed into endometrial cavity.  Introducer removed.  Strings cut to 2cm.  Tenaculum removed from cervix.  Minimal bleeding noted.  Pt tolerated the procedure well.  All instruments removed from vagina.  Correct placement confirmed with PUS and ultrasound was used throughout procedure to ensure correct placement.  Lot number: [id number].  Expiration:  4/20.   A: Insertion of Mirena IUD Contraception desires Menorrhagia with irregular  cycle Diabetes, poorly controlled  P:  Return for recheck 6-8 weeks Pt aware to call for any concerns Pt aware removal due no later than 05/12/2021.  IUD card given to pt.

## 2016-05-31 ENCOUNTER — Telehealth: Payer: Self-pay | Admitting: Obstetrics & Gynecology

## 2016-05-31 NOTE — Telephone Encounter (Signed)
Patient cancelled lab appointment for Friday and will have done at her endocrinologist office.

## 2016-06-03 ENCOUNTER — Other Ambulatory Visit: Payer: 59

## 2016-06-03 ENCOUNTER — Encounter: Payer: Self-pay | Admitting: Endocrinology

## 2016-06-03 ENCOUNTER — Ambulatory Visit (INDEPENDENT_AMBULATORY_CARE_PROVIDER_SITE_OTHER): Payer: 59 | Admitting: Endocrinology

## 2016-06-03 VITALS — BP 142/84 | HR 110 | Ht 62.5 in | Wt 267.0 lb

## 2016-06-03 DIAGNOSIS — E119 Type 2 diabetes mellitus without complications: Secondary | ICD-10-CM

## 2016-06-03 LAB — POCT GLYCOSYLATED HEMOGLOBIN (HGB A1C): HEMOGLOBIN A1C: 10

## 2016-06-03 MED ORDER — METFORMIN HCL ER 750 MG PO TB24
750.0000 mg | ORAL_TABLET | Freq: Every day | ORAL | 13 refills | Status: DC
Start: 2016-06-03 — End: 2017-04-03

## 2016-06-03 MED ORDER — EMPAGLIFLOZIN 25 MG PO TABS: 25.0000 mg | ORAL_TABLET | Freq: Every day | ORAL | 11 refills | Status: DC

## 2016-06-03 MED ORDER — LINAGLIPTIN 5 MG PO TABS: 5.0000 mg | ORAL_TABLET | Freq: Every day | ORAL | 11 refills | Status: DC

## 2016-06-03 NOTE — Patient Instructions (Addendum)
check your blood sugar a day.  vary the time of day when you check, between before the 3 meals, and at bedtime.  also check if you have symptoms of your blood sugar being too high or too low.  please keep a record of the readings and bring it to your next appointment here (or you can bring the meter itself).  You can write it on any piece of paper.  please call us sooner if your blood sugar goes below 70, or if you have a lot of readings over 200. I have sent prescriptions to your pharmacy, to add "jardiance," and "tradjenta."  Please continue the same metformin (1/day).  Please come back for a follow-up appointment in 3 weeks.

## 2016-06-03 NOTE — Telephone Encounter (Signed)
Opened in error, will close encounter

## 2016-06-03 NOTE — Progress Notes (Signed)
Subjective:    Patient ID: Jill [name], female    DOB: [birth year], 55 y.o.   MRN: [id number]  HPI  Pt returns for f/u of diabetes mellitus: DM type: 2 Dx'ed: 123456 Complications: none Therapy: 2 oral meds GDM: never DKA: never Severe hypoglycemia: never Pancreatitis: never Other: she has never been on insulin; she declines bariatric surgery; edema limits oral DM rx options. Interval history: She does not take jardiance.  pt states she feels well in general.   Past Medical History:  Diagnosis Date  . Allergy   . Anemia   . Asthma    13yrs ago  . Chronic deafness of both ears    left ear has 40% hearing-right ear has 60 % hearing  . Complication of anesthesia    Hard to wake up  . Diabetes mellitus without complication ([place])   . Dysmenorrhea   . Migraines    none in last 11 yrs  . Pelvic kidney    left  . Post partum depression   . Urinary incontinence     Past Surgical History:  Procedure Laterality Date  . CESAREAN SECTION    . COLONOSCOPY WITH PROPOFOL N/A 03/24/2015   Procedure: COLONOSCOPY WITH PROPOFOL;  Surgeon: Juanita Craver, MD;  [facility];  Service: Endoscopy;  Laterality: N/A;  . KNEE ARTHROSCOPY WITH MEDIAL MENISECTOMY Left 04/17/2014   Procedure: LEFT KNEE ARTHROSCOPY WITH PARTIAL MEDIAL MENISCECTOMY AND CHONDROPLASTY;  Surgeon: Johnn Hai, MD;  Location[facility];  Service: Orthopedics;  Laterality: Left;  . TYMPANOSTOMY TUBE PLACEMENT      Social History   Social History  . Marital status: Single    Spouse name: N/A  . Number of children: N/A  . Years of education: N/A   Occupational History  . social services case worker   .  [place] History Main Topics  . Smoking status: Never Smoker  . Smokeless tobacco: Never Used  . Alcohol use No  . Drug use: No  . Sexual activity: No   Other Topics Concern  . Not on file   Social History Narrative   Single. Education: [employer].     Current Outpatient  Prescriptions on File Prior to Visit  Medication Sig Dispense Refill  . glucose blood (ONETOUCH VERIO) test strip 1 each by Other route daily. And lancets 1/day 100 each 12  . glucose blood test strip Test blood sugar daily. E11.9 100 each 3  . losartan (COZAAR) 25 MG tablet Take 1 tablet (25 mg total) by mouth daily. 30 tablet 13  . Vitamin D, Ergocalciferol, (DRISDOL) 50000 units CAPS capsule Take 1 capsule (50,000 Units total) by mouth every 7 (seven) days. 4 capsule 1   No current facility-administered medications on file prior to visit.     Allergies  Allergen Reactions  . Adhesive [Tape]     Pulls skin off  . Latex Itching  . Toradol [Ketorolac Tromethamine]     Joint pain and swelling  . Tramadol Other (See Comments)    JOINT PAIN    Family History  Problem Relation Age of Onset  . Hypertension Mother   . Arthritis Mother   . Other Sister     blood clot  . Cancer Sister   . Hypertension Sister   . Diabetes Maternal Grandmother   . Heart failure Maternal Grandmother   . Heart disease Maternal Grandmother   . Hyperlipidemia Maternal Grandmother   . Hypertension Maternal Grandmother   .  Hypertension Maternal Grandfather   . Hyperlipidemia Maternal Grandfather   . Breast cancer Paternal Aunt   . Cancer Paternal Grandfather    BP (!) 142/84   Pulse (!) 110   Ht 5' 2.5" (1.588 m)   Wt 267 lb (121.1 kg)   SpO2 98%   BMI 48.06 kg/m   Review of Systems Denies weight change.      Objective:   Physical Exam VITAL SIGNS:  See vs page GENERAL: no distress Pulses: dorsalis pedis intact bilat.   MSK: no deformity of the feet CV: 1+ bilat leg edema Skin:  no ulcer on the feet.  normal color and temp on the feet. Neuro: sensation is intact to touch on the feet Ext: There is bilateral onychomycosis of the toenails  A1c=10.4%    Assessment & Plan:  Type 2 DM: therapy limited by noncompliance.   Patient is advised the following: Patient Instructions  check your  blood sugar a day.  vary the time of day when you check, between before the 3 meals, and at bedtime.  also check if you have symptoms of your blood sugar being too high or too low.  please keep a record of the readings and bring it to your next appointment here (or you can bring the meter itself).  You can write it on any piece of paper.  please call us sooner if your blood sugar goes below 70, or if you have a lot of readings over 200. I have sent prescriptions to your pharmacy, to add "jardiance," and "tradjenta."  Please continue the same metformin (1/day).  Please come back for a follow-up appointment in 3 weeks.

## 2016-06-20 ENCOUNTER — Telehealth: Payer: Self-pay | Admitting: Obstetrics & Gynecology

## 2016-06-20 NOTE — Telephone Encounter (Signed)
Patient is still bleeding and has canceled her appointment for 6-8 recheck IUD. No available appointments to reschedule this appointment.

## 2016-06-20 NOTE — Telephone Encounter (Signed)
Spoke with patient. Patient requesting to schedule appt previously cancelled for 6-8wk IUD recheck. Patient r/s for 07/04/16 at 2:30pm with Dr. Sabra Heck. Patient states she is having scant amount of bleeding when wiping at this time, "no where as much as I was". Patient reports taking provera BID daily. Patient is agreeable to date and time.  Routing to provider for final review. Patient is agreeable to disposition. Will close encounter.

## 2016-06-23 ENCOUNTER — Ambulatory Visit: Payer: Self-pay | Admitting: Obstetrics & Gynecology

## 2016-06-26 NOTE — Progress Notes (Signed)
Subjective:    Patient ID: Jill [name], female    DOB: 08-Feb-1961, 55 y.o.   MRN: [id number]  HPI Pt returns for f/u of diabetes mellitus: DM type: 2 Dx'ed: 123456 Complications: none Therapy: 2 oral meds GDM: never DKA: never Severe hypoglycemia: never Pancreatitis: never Other: she has never been on insulin; she declines bariatric surgery; edema limits oral DM rx options. Interval history: no cbg record, but states cbg's are well-controlled.  pt states she feels well in general. Past Medical History:  Diagnosis Date  . Allergy   . Anemia   . Asthma    69yrs ago  . Chronic deafness of both ears    left ear has 40% hearing-right ear has 60 % hearing  . Complication of anesthesia    Hard to wake up  . Diabetes mellitus without complication ([place])   . Dysmenorrhea   . Migraines    none in last 11 yrs  . Pelvic kidney    left  . Post partum depression   . Urinary incontinence     Past Surgical History:  Procedure Laterality Date  . CESAREAN SECTION    . COLONOSCOPY WITH PROPOFOL N/A 03/24/2015   Procedure: COLONOSCOPY WITH PROPOFOL;  Surgeon: Juanita Craver, MD;  Location: [facility];  Service: Endoscopy;  Laterality: N/A;  . KNEE ARTHROSCOPY WITH MEDIAL MENISECTOMY Left 04/17/2014   Procedure: LEFT KNEE ARTHROSCOPY WITH PARTIAL MEDIAL MENISCECTOMY AND CHONDROPLASTY;  Surgeon: Johnn Hai, MD;  Location: [facility];  Service: Orthopedics;  Laterality: Left;  . TYMPANOSTOMY TUBE PLACEMENT      Social History   Social History  . Marital status: Single    Spouse name: N/A  . Number of children: N/A  . Years of education: N/A   Occupational History  . social services case worker   .  [place] History Main Topics  . Smoking status: Never Smoker  . Smokeless tobacco: Never Used  . Alcohol use No  . Drug use: No  . Sexual activity: No   Other Topics Concern  . Not on file   Social History Narrative   Single. Education: [employer].      Current Outpatient Prescriptions on File Prior to Visit  Medication Sig Dispense Refill  . empagliflozin (JARDIANCE) 25 MG TABS tablet Take 25 mg by mouth daily. 30 tablet 11  . glucose blood (ONETOUCH VERIO) test strip 1 each by Other route daily. And lancets 1/day 100 each 12  . glucose blood test strip Test blood sugar daily. E11.9 100 each 3  . linagliptin (TRADJENTA) 5 MG TABS tablet Take 1 tablet (5 mg total) by mouth daily. 30 tablet 11  . losartan (COZAAR) 25 MG tablet Take 1 tablet (25 mg total) by mouth daily. 30 tablet 13  . metFORMIN (GLUCOPHAGE-XR) 750 MG 24 hr tablet Take 1 tablet (750 mg total) by mouth daily with breakfast. 30 tablet 13  . Vitamin D, Ergocalciferol, (DRISDOL) 50000 units CAPS capsule Take 1 capsule (50,000 Units total) by mouth every 7 (seven) days. 4 capsule 1   No current facility-administered medications on file prior to visit.     Allergies  Allergen Reactions  . Adhesive [Tape]     Pulls skin off  . Latex Itching  . Toradol [Ketorolac Tromethamine]     Joint pain and swelling  . Tramadol Other (See Comments)    JOINT PAIN    Family History  Problem Relation Age of Onset  . Hypertension Mother   .  Arthritis Mother   . Other Sister     blood clot  . Cancer Sister   . Hypertension Sister   . Diabetes Maternal Grandmother   . Heart failure Maternal Grandmother   . Heart disease Maternal Grandmother   . Hyperlipidemia Maternal Grandmother   . Hypertension Maternal Grandmother   . Hypertension Maternal Grandfather   . Hyperlipidemia Maternal Grandfather   . Breast cancer Paternal Aunt   . Cancer Paternal Grandfather     BP 128/70   Pulse 93   Ht 5' 2.5" (1.588 m)   Wt 263 lb (119.3 kg)   SpO2 97%   BMI 47.34 kg/m    Review of Systems She denies hypoglycemia.  She is due to see GYN in a few weeks for menorrhagia    Objective:   Physical Exam VITAL SIGNS:  See vs page GENERAL: no distress CV: 1+ bilat leg edema       Assessment & Plan:  Type 2 DM: improved control: check fructosamine Edema: this limits rx options of DM Menorrhagia: check cbc and fe  Patient is advised the following: Patient Instructions  check your blood sugar a day.  vary the time of day when you check, between before the 3 meals, and at bedtime.  also check if you have symptoms of your blood sugar being too high or too low.  please keep a record of the readings and bring it to your next appointment here (or you can bring the meter itself).  You can write it on any piece of paper.  please call us sooner if your blood sugar goes below 70, or if you have a lot of readings over 200. Please continue the same medications blood tests are requested for you today.  We'll let you know about the results. Please come back for a follow-up appointment in 3 months.

## 2016-06-27 ENCOUNTER — Encounter: Payer: Self-pay | Admitting: Endocrinology

## 2016-06-27 ENCOUNTER — Ambulatory Visit (INDEPENDENT_AMBULATORY_CARE_PROVIDER_SITE_OTHER): Payer: 59 | Admitting: Endocrinology

## 2016-06-27 VITALS — BP 128/70 | HR 93 | Ht 62.5 in | Wt 263.0 lb

## 2016-06-27 DIAGNOSIS — N921 Excessive and frequent menstruation with irregular cycle: Secondary | ICD-10-CM

## 2016-06-27 DIAGNOSIS — E119 Type 2 diabetes mellitus without complications: Secondary | ICD-10-CM | POA: Diagnosis not present

## 2016-06-27 LAB — CBC WITH DIFFERENTIAL/PLATELET
BASOS ABS: 0 10*3/uL (ref 0.0–0.1)
Basophils Relative: 0.4 % (ref 0.0–3.0)
Eosinophils Absolute: 0.1 10*3/uL (ref 0.0–0.7)
Eosinophils Relative: 1.1 % (ref 0.0–5.0)
HEMATOCRIT: 39 % (ref 36.0–46.0)
Hemoglobin: 12.9 g/dL (ref 12.0–15.0)
LYMPHS PCT: 34 % (ref 12.0–46.0)
Lymphs Abs: 2.5 10*3/uL (ref 0.7–4.0)
MCHC: 33.1 g/dL (ref 30.0–36.0)
MCV: 80.7 fl (ref 78.0–100.0)
MONOS PCT: 7.6 % (ref 3.0–12.0)
Monocytes Absolute: 0.5 10*3/uL (ref 0.1–1.0)
NEUTROS ABS: 4.1 10*3/uL (ref 1.4–7.7)
Neutrophils Relative %: 56.9 % (ref 43.0–77.0)
Platelets: 246 10*3/uL (ref 150.0–400.0)
RBC: 4.84 Mil/uL (ref 3.87–5.11)
RDW: 14.6 % (ref 11.5–15.5)
WBC: 7.2 10*3/uL (ref 4.0–10.5)

## 2016-06-27 LAB — BASIC METABOLIC PANEL
BUN: 17 mg/dL (ref 6–23)
CALCIUM: 10.4 mg/dL (ref 8.4–10.5)
CO2: 25 meq/L (ref 19–32)
Chloride: 108 mEq/L (ref 96–112)
Creatinine, Ser: 1.15 mg/dL (ref 0.40–1.20)
GFR: 63.18 mL/min (ref >60.00)
Glucose, Bld: 140 mg/dL — ABNORMAL HIGH (ref 70–99)
POTASSIUM: 4.1 meq/L (ref 3.5–5.1)
SODIUM: 140 meq/L (ref 135–145)

## 2016-06-27 LAB — HEMOGLOBIN A1C: HEMOGLOBIN A1C: 9 % — AB (ref 4.6–6.5)

## 2016-06-27 LAB — IBC PANEL
Iron: 40 ug/dL — ABNORMAL LOW (ref 42–145)
SATURATION RATIOS: 8.7 % — AB (ref 20.0–50.0)
Transferrin: 328 mg/dL (ref 212.0–360.0)

## 2016-06-27 NOTE — Patient Instructions (Addendum)
check your blood sugar a day.  vary the time of day when you check, between before the 3 meals, and at bedtime.  also check if you have symptoms of your blood sugar being too high or too low.  please keep a record of the readings and bring it to your next appointment here (or you can bring the meter itself).  You can write it on any piece of paper.  please call us sooner if your blood sugar goes below 70, or if you have a lot of readings over 200. Please continue the same medications. blood tests are requested for you today.  We'll let you know about the results. Please come back for a follow-up appointment in 3 months.

## 2016-06-29 ENCOUNTER — Other Ambulatory Visit: Payer: 59

## 2016-06-29 DIAGNOSIS — E119 Type 2 diabetes mellitus without complications: Secondary | ICD-10-CM

## 2016-06-30 ENCOUNTER — Other Ambulatory Visit: Payer: Self-pay | Admitting: Obstetrics & Gynecology

## 2016-07-01 LAB — FRUCTOSAMINE: Fructosamine: 309 umol/L — ABNORMAL HIGH (ref 190–270)

## 2016-07-04 ENCOUNTER — Ambulatory Visit (INDEPENDENT_AMBULATORY_CARE_PROVIDER_SITE_OTHER): Payer: 59 | Admitting: Obstetrics & Gynecology

## 2016-07-04 ENCOUNTER — Encounter: Payer: Self-pay | Admitting: Obstetrics & Gynecology

## 2016-07-04 VITALS — BP 140/64 | HR 100 | Resp 16 | Ht 62.0 in | Wt 259.8 lb

## 2016-07-04 DIAGNOSIS — Z975 Presence of (intrauterine) contraceptive device: Secondary | ICD-10-CM | POA: Diagnosis not present

## 2016-07-04 DIAGNOSIS — N921 Excessive and frequent menstruation with irregular cycle: Secondary | ICD-10-CM

## 2016-07-04 DIAGNOSIS — D251 Intramural leiomyoma of uterus: Secondary | ICD-10-CM | POA: Diagnosis not present

## 2016-07-04 MED ORDER — VITAMIN D (ERGOCALCIFEROL) 1.25 MG (50000 UNIT) PO CAPS: 50000.0000 [IU] | ORAL_CAPSULE | ORAL | 4 refills | Status: DC

## 2016-07-04 NOTE — Telephone Encounter (Signed)
Medication refill request: Vitamin D 50000 Last AEX:  03/18/16 SM Next AEX: 06/15/17  Last MMG (if hormonal medication request): 02/13/15 BIRADS 1 negative Refill authorized: 03/24/16 #4 w/1 refill; today please advise; last Vit D check - 03/18/16 - 22 - appointment today w/ Dr. Sabra Heck

## 2016-07-04 NOTE — Progress Notes (Signed)
55 y.o. G1P1001 Single African American female presents for followed up after insertion of Mirena IUD on 05/12/16.  Pt reports she is doing well.  Has continued to take her Provera 10mg  bid.  Reports she is just having some dark, daily spotting but it is not "nearly as heavy" as it was.  If her bleeding continues at this rate, she reports she will be very please.  She did have an endometrial biopsy performed 04/07/16 showing scant benign endometrium.  Denies cramping or pelvic pain.  She has not tried to feel her IUD string.  Pt has followed up with Dr. Loanne Drilling.  Most recent HbA1C on 06/27/16 was 9.0 (down from 10.0).  Pt reports he dicussed weight loss surgery with her.  States "I just like food too much" to proceed with surgery.  Medications are being adjusted.  She did have a follow up BMD and calcium was normal.  Did not have PTH as the calcium level was normal.    Patient Active Problem List   Diagnosis Date Noted  . Menorrhagia with irregular cycle 05/03/2016  . Fibroids, intramural 05/03/2016  . Pelvic kidney   . Diabetes mellitus without complication ([place])   . OBESITY, MORBID 07/30/2009  . DYSPNEA ON EXERTION 07/30/2009  . Diabetes ([place]) 05/19/2009  . OBSTRUCTIVE SLEEP APNEA 05/19/2009  . FATIGUE 05/19/2009  . SYMPTOM, DISTURBANCE, SLEEP NOS 06/01/2007  . MOTION SICKNESS 03/12/2007  . ASTHMA 01/31/2007  . GERD 01/31/2007  . MIGRAINE, MENSTRUAL 01/31/2007   Past Medical History:  Diagnosis Date  . Allergy   . Anemia   . Asthma    1yrs ago  . Chronic deafness of both ears    left ear has 40% hearing-right ear has 60 % hearing  . Complication of anesthesia    Hard to wake up  . Diabetes mellitus without complication ([place])   . Dysmenorrhea   . Migraines    none in last 11 yrs  . Pelvic kidney    left  . Post partum depression   . Urinary incontinence    Current Outpatient Prescriptions on File Prior to Visit  Medication Sig Dispense Refill  . empagliflozin (JARDIANCE)  25 MG TABS tablet Take 25 mg by mouth daily. 30 tablet 11  . glucose blood (ONETOUCH VERIO) test strip 1 each by Other route daily. And lancets 1/day 100 each 12  . glucose blood test strip Test blood sugar daily. E11.9 100 each 3  . linagliptin (TRADJENTA) 5 MG TABS tablet Take 1 tablet (5 mg total) by mouth daily. 30 tablet 11  . losartan (COZAAR) 25 MG tablet Take 1 tablet (25 mg total) by mouth daily. 30 tablet 13  . medroxyPROGESTERone (PROVERA) 10 MG tablet Take 10 mg by mouth 2 (two) times daily.    . metFORMIN (GLUCOPHAGE-XR) 750 MG 24 hr tablet Take 1 tablet (750 mg total) by mouth daily with breakfast. 30 tablet 13  . Vitamin D, Ergocalciferol, (DRISDOL) 50000 units CAPS capsule Take 1 capsule (50,000 Units total) by mouth every 7 (seven) days. 4 capsule 1   No current facility-administered medications on file prior to visit.    Adhesive [tape]; Latex; Toradol [ketorolac tromethamine]; and Tramadol  Review of Systems  Genitourinary:       Vaginal spotting  All other systems reviewed and are negative.    Vitals:   07/04/16 1444  BP: 140/64  Pulse: 100  Resp: 16  Weight: 259 lb 12.8 oz (117.8 kg)  Height: 5\' 2"  (1.575 m)  Gen:  WNWF healthy female NAD Abdomen: soft, non-tender Groin:  no inguinal nodes palpated  Pelvic exam: Vulva:  normal female genitalia Vagina:  normal vagina Cervix:  Non-tender, Negative CMT, no lesions or redness.  IUD string noted, about 2cm.   Uterus:  Enlarged to about 10 weeks size, nodular c/w fibroids    A: H/O Menorrhagia and uterine fibroids IUD follow-up after insertion of Mirena on 05/12/16 Vit D deficiency  P:  Exam normal.  Pt knows ok to stop her provera now.  Aware she may have a "cycle" and she knows to call if this is heavy.  Pt states "no news from me is good news". Pt knows to call with any new concerns or problems. RF for Vit  D 50K IU weekly.  #12/4RF to pharmacy.  Will recheck at [place] 2018. Had follow up calcium level  with Dr. Loanne Drilling on 06/27/16 which was normal.  Will not test PTH unless this becomes abnormal again.

## 2016-07-05 DIAGNOSIS — Z975 Presence of (intrauterine) contraceptive device: Secondary | ICD-10-CM | POA: Insufficient documentation

## 2016-08-03 ENCOUNTER — Ambulatory Visit (INDEPENDENT_AMBULATORY_CARE_PROVIDER_SITE_OTHER): Payer: 59 | Admitting: Physician Assistant

## 2016-08-03 VITALS — BP 126/74 | HR 113 | Temp 98.2°F | Resp 18 | Ht 64.0 in | Wt 252.0 lb

## 2016-08-03 DIAGNOSIS — N390 Urinary tract infection, site not specified: Secondary | ICD-10-CM

## 2016-08-03 DIAGNOSIS — R300 Dysuria: Secondary | ICD-10-CM

## 2016-08-03 DIAGNOSIS — R509 Fever, unspecified: Secondary | ICD-10-CM | POA: Diagnosis not present

## 2016-08-03 DIAGNOSIS — R319 Hematuria, unspecified: Secondary | ICD-10-CM | POA: Diagnosis not present

## 2016-08-03 LAB — POCT URINALYSIS DIP (MANUAL ENTRY)
Bilirubin, UA: NEGATIVE
Glucose, UA: >=1000 — AB
Nitrite, UA: NEGATIVE
Protein Ur, POC: 100 — AB
Spec Grav, UA: 1.01
Urobilinogen, UA: 1
pH, UA: 5

## 2016-08-03 LAB — POCT CBC
Granulocyte percent: 85.6 %G — AB (ref 37–80)
HCT, POC: 39.4 % (ref 37.7–47.9)
Hemoglobin: 13.3 g/dL (ref 12.2–16.2)
Lymph, poc: 1 (ref 0.6–3.4)
MCH, POC: 26.5 pg — AB (ref 27–31.2)
MCHC: 33.8 g/dL (ref 31.8–35.4)
MCV: 78.3 fL — AB (ref 80–97)
MID (cbc): 0.5 (ref 0–0.9)
MPV: 7.4 fL (ref 0–99.8)
POC Granulocyte: 8.9 — AB (ref 2–6.9)
POC LYMPH PERCENT: 10 %L (ref 10–50)
POC MID %: 4.4 %M (ref 0–12)
Platelet Count, POC: 210 10*3/uL (ref 142–424)
RBC: 5.03 M/uL (ref 4.04–5.48)
RDW, POC: 13.6 %
WBC: 10.4 10*3/uL — AB (ref 4.6–10.2)

## 2016-08-03 LAB — POC MICROSCOPIC URINALYSIS (UMFC): Mucus: ABSENT

## 2016-08-03 MED ORDER — NITROFURANTOIN MONOHYD MACRO 100 MG PO CAPS: 100.0000 mg | ORAL_CAPSULE | Freq: Two times a day (BID) | ORAL | 0 refills | Status: DC

## 2016-08-03 NOTE — Progress Notes (Signed)
Jill [name]  MRN: [id number] DOB: [birth year]  PCP: No PCP Per Patient  Subjective:  Pt is a 55 year old female who presents to clinic for fever and chills x 2 days.  Two days ago she had burning with urination.  +low abdominal pain Took two days of AZO - yesterday and the day before, burning is gone. She endorses blood in urine, however she had an IUD placed two months ago and recently started spotting..  Took ibuprofen, helped some.  Denies recent sexual encounter.  Denies chest pain, flank pain, low back pain, increased urgency, increased frequency.   Review of Systems  Constitutional: Positive for chills and fever. Negative for fatigue.  Respiratory: Negative for cough, shortness of breath and wheezing.   Cardiovascular: Negative for chest pain and palpitations.  Gastrointestinal: Negative for abdominal pain, diarrhea, nausea and vomiting.  Genitourinary: Positive for dysuria and hematuria. Negative for decreased urine volume, difficulty urinating, enuresis, flank pain, frequency and urgency.  Musculoskeletal: Negative for back pain.  Neurological: Negative for dizziness, weakness, light-headedness and headaches.    Patient Active Problem List   Diagnosis Date Noted  . IUD (intrauterine device) in place 07/05/2016  . Menorrhagia with irregular cycle 05/03/2016  . Fibroids, intramural 05/03/2016  . Pelvic kidney   . Diabetes mellitus without complication ([place])   . OBESITY, MORBID 07/30/2009  . DYSPNEA ON EXERTION 07/30/2009  . Diabetes ([place]) 05/19/2009  . OBSTRUCTIVE SLEEP APNEA 05/19/2009  . FATIGUE 05/19/2009  . SYMPTOM, DISTURBANCE, SLEEP NOS 06/01/2007  . MOTION SICKNESS 03/12/2007  . ASTHMA 01/31/2007  . GERD 01/31/2007  . MIGRAINE, MENSTRUAL 01/31/2007    Current Outpatient Prescriptions on File Prior to Visit  Medication Sig Dispense Refill  . empagliflozin (JARDIANCE) 25 MG TABS tablet Take 25 mg by mouth daily. 30 tablet 11  . glucose blood (ONETOUCH  VERIO) test strip 1 each by Other route daily. And lancets 1/day 100 each 12  . glucose blood test strip Test blood sugar daily. E11.9 100 each 3  . linagliptin (TRADJENTA) 5 MG TABS tablet Take 1 tablet (5 mg total) by mouth daily. 30 tablet 11  . losartan (COZAAR) 25 MG tablet Take 1 tablet (25 mg total) by mouth daily. 30 tablet 13  . metFORMIN (GLUCOPHAGE-XR) 750 MG 24 hr tablet Take 1 tablet (750 mg total) by mouth daily with breakfast. 30 tablet 13  . ONE TOUCH LANCETS MISC TAKE 1 TABLET BY MOUTH EVERY DAY  12  . Vitamin D, Ergocalciferol, (DRISDOL) 50000 units CAPS capsule Take 1 capsule (50,000 Units total) by mouth every 7 (seven) days. 12 capsule 4  . medroxyPROGESTERone (PROVERA) 10 MG tablet Take 10 mg by mouth 2 (two) times daily.     No current facility-administered medications on file prior to visit.     Allergies  Allergen Reactions  . Adhesive [Tape]     Pulls skin off  . Latex Itching  . Toradol [Ketorolac Tromethamine]     Joint pain and swelling  . Tramadol Other (See Comments)    JOINT PAIN     Objective:  BP 126/74 (BP Location: Right Arm, Patient Position: Sitting, Cuff Size: Large)   Pulse (!) 113   Temp 98.2 F (36.8 C)   Resp 18   Ht 5\' 4"  (1.626 m)   Wt 252 lb (114.3 kg)   LMP  (Approximate) Comment: pt states ongoing for 50ish days   SpO2 96%   BMI 43.26 kg/m   Physical Exam  Constitutional:  She is oriented to person, place, and time and well-developed, well-nourished, and in no distress. No distress.  Cardiovascular: Normal rate, regular rhythm and normal heart sounds.   Pulmonary/Chest: Effort normal. No respiratory distress.  Abdominal: Soft. Bowel sounds are normal. There is no tenderness. There is no CVA tenderness.  Neurological: She is alert and oriented to person, place, and time. GCS score is 15.  Skin: Skin is warm and dry.  Psychiatric: Mood, memory, affect and judgment normal.  Vitals reviewed.  Results for orders placed or  performed in visit on 08/03/16  POCT Microscopic Urinalysis ([facility])  Result Value Ref Range   WBC,UR,HPF,POC Too numerous to count  (A) None WBC/hpf   RBC,UR,HPF,POC Too numerous to count  (A) None RBC/hpf   Bacteria None None, Too numerous to count   Mucus Absent Absent   Epithelial Cells, UR Per Microscopy Moderate (A) None, Too numerous to count cells/hpf  POCT urinalysis dipstick  Result Value Ref Range   Color, UA [place] (A) yellow   Clarity, UA cloudy (A) clear   Glucose, UA >=1,000 (A) negative   Bilirubin, UA negative negative   Ketones, POC UA trace (5) (A) negative   Spec Grav, UA 1.010    Blood, UA large (A) negative   pH, UA 5.0    Protein Ur, POC =100 (A) negative   Urobilinogen, UA 1.0    Nitrite, UA Negative Negative   Leukocytes, UA Trace (A) Negative  POCT CBC  Result Value Ref Range   WBC 10.4 (A) 4.6 - 10.2 K/uL   Lymph, poc 1.0 0.6 - 3.4   POC LYMPH PERCENT 10.0 10 - 50 %L   MID (cbc) 0.5 0 - 0.9   POC MID % 4.4 0 - 12 %M   POC Granulocyte 8.9 (A) 2 - 6.9   Granulocyte percent 85.6 (A) 37 - 80 %G   RBC 5.03 4.04 - 5.48 M/uL   Hemoglobin 13.3 12.2 - 16.2 g/dL   HCT, POC 39.4 37.7 - 47.9 %   MCV 78.3 (A) 80 - 97 fL   MCH, POC 26.5 (A) 27 - 31.2 pg   MCHC 33.8 31.8 - 35.4 g/dL   RDW, POC 13.6 %   Platelet Count, POC 210 142 - 424 K/uL   MPV 7.4 0 - 99.8 fL    Assessment and Plan :  1. Urinary tract infection with hematuria, site unspecified 2. Fever, unspecified fever cause 3. Dysuria - nitrofurantoin, macrocrystal-monohydrate, (MACROBID) 100 MG capsule; Take 1 capsule (100 mg total) by mouth 2 (two) times daily.  Dispense: 14 capsule; Refill: 0 - POCT Microscopic Urinalysis ([facility]) - POCT urinalysis dipstick - POCT CBC - Urine culture - Culture pending, will contact with results if medication change is needed. Advised increasing water intake. RTC in 5-7 days if no improvement.   Mercer Pod, PA-C  [facility] 08/03/2016 3:04 PM

## 2016-08-03 NOTE — Patient Instructions (Addendum)
Please drink plenty of water. At least 2 L a day.  Thank you for coming in today. I hope you feel we met your needs.  Feel free to call [facility] if you have any questions or further requests.  Please consider signing up for MyChart if you do not already have it, as this is a great way to communicate with me.  Best,  Whitney McVey, PA-C   Urinary Tract Infection, Adult A urinary tract infection (UTI) is an infection of any part of the urinary tract, which includes the kidneys, ureters, bladder, and urethra. These organs make, store, and get rid of urine in the body. UTI can be a bladder infection (cystitis) or kidney infection (pyelonephritis). What are the causes? This infection may be caused by fungi, viruses, or bacteria. Bacteria are the most common cause of UTIs. This condition can also be caused by repeated incomplete emptying of the bladder during urination. What increases the risk? This condition is more likely to develop if:  You ignore your need to urinate or hold urine for long periods of time.  You do not empty your bladder completely during urination.  You wipe back to front after urinating or having a bowel movement, if you are female.  You are uncircumcised, if you are female.  You are constipated.  You have a urinary catheter that stays in place (indwelling).  You have a weak defense (immune) system.  You have a medical condition that affects your bowels, kidneys, or bladder.  You have diabetes.  You take antibiotic medicines frequently or for long periods of time, and the antibiotics no longer work well against certain types of infections (antibiotic resistance).  You take medicines that irritate your urinary tract.  You are exposed to chemicals that irritate your urinary tract.  You are female. What are the signs or symptoms? Symptoms of this condition include:  Fever.  Frequent urination or passing small amounts of urine frequently.  Needing to urinate  urgently.  Pain or burning with urination.  Urine that smells bad or unusual.  Cloudy urine.  Pain in the lower abdomen or back.  Trouble urinating.  Blood in the urine.  Vomiting or being less hungry than normal.  Diarrhea or abdominal pain.  Vaginal discharge, if you are female. How is this diagnosed? This condition is diagnosed with a medical history and physical exam. You will also need to provide a urine sample to test your urine. Other tests may be done, including:  Blood tests.  Sexually transmitted disease (STD) testing. If you have had more than one UTI, a cystoscopy or imaging studies may be done to determine the cause of the infections. How is this treated? Treatment for this condition often includes a combination of two or more of the following:  Antibiotic medicine.  Other medicines to treat less common causes of UTI.  Over-the-counter medicines to treat pain.  Drinking enough water to stay hydrated. Follow these instructions at home:  Take over-the-counter and prescription medicines only as told by your health care provider.  If you were prescribed an antibiotic, take it as told by your health care provider. Do not stop taking the antibiotic even if you start to feel better.  Avoid alcohol, caffeine, tea, and carbonated beverages. They can irritate your bladder.  Drink enough fluid to keep your urine clear or pale yellow.  Keep all follow-up visits as told by your health care provider. This is important.  Make sure to:  Empty your bladder often  and completely. Do not hold urine for long periods of time.  Empty your bladder before and after sex.  Wipe from front to back after a bowel movement if you are female. Use each tissue one time when you wipe. Contact a health care provider if:  You have back pain.  You have a fever.  You feel nauseous or vomit.  Your symptoms do not get better after 3 days.  Your symptoms go away and then  return. Get help right away if:  You have severe back pain or lower abdominal pain.  You are vomiting and cannot keep down any medicines or water. This information is not intended to replace advice given to you by your health care provider. Make sure you discuss any questions you have with your health care provider. Document Released: 05/04/2005 Document Revised: 01/06/2016 Document Reviewed: 06/15/2015 Elsevier Interactive Patient Education  2017 [employer].    IF you received an x-ray today, you will receive an invoice from [facility] Radiology. Please contact [facility] Radiology at [phone] with questions or concerns regarding your invoice.   IF you received labwork today, you will receive an invoice from [facility]. Please contact [facility] at [phone] with questions or concerns regarding your invoice.   Our billing staff will not be able to assist you with questions regarding bills from these companies.  You will be contacted with the lab results as soon as they are available. The fastest way to get your results is to activate your My Chart account. Instructions are located on the last page of this paperwork. If you have not heard from [place] regarding the results in 2 weeks, please contact this office.

## 2016-08-05 LAB — URINE CULTURE

## 2016-08-10 ENCOUNTER — Other Ambulatory Visit (INDEPENDENT_AMBULATORY_CARE_PROVIDER_SITE_OTHER): Payer: 59

## 2016-08-10 ENCOUNTER — Ambulatory Visit (INDEPENDENT_AMBULATORY_CARE_PROVIDER_SITE_OTHER): Payer: 59 | Admitting: Internal Medicine

## 2016-08-10 ENCOUNTER — Encounter: Payer: Self-pay | Admitting: Internal Medicine

## 2016-08-10 VITALS — BP 134/70 | HR 86 | Temp 98.6°F | Resp 16 | Ht 64.0 in | Wt 259.0 lb

## 2016-08-10 DIAGNOSIS — K2100 Gastro-esophageal reflux disease with esophagitis, without bleeding: Secondary | ICD-10-CM

## 2016-08-10 DIAGNOSIS — G8929 Other chronic pain: Secondary | ICD-10-CM

## 2016-08-10 DIAGNOSIS — N390 Urinary tract infection, site not specified: Secondary | ICD-10-CM

## 2016-08-10 DIAGNOSIS — E119 Type 2 diabetes mellitus without complications: Secondary | ICD-10-CM

## 2016-08-10 DIAGNOSIS — J069 Acute upper respiratory infection, unspecified: Secondary | ICD-10-CM

## 2016-08-10 DIAGNOSIS — R1013 Epigastric pain: Secondary | ICD-10-CM

## 2016-08-10 LAB — URINALYSIS, ROUTINE W REFLEX MICROSCOPIC
Bilirubin Urine: NEGATIVE
Ketones, ur: NEGATIVE
LEUKOCYTES UA: NEGATIVE
Nitrite: NEGATIVE
SPECIFIC GRAVITY, URINE: 1.015 (ref 1.000–1.030)
UROBILINOGEN UA: 1 (ref 0.0–1.0)
Urine Glucose: 500 — AB
pH: 6 (ref 5.0–8.0)

## 2016-08-10 MED ORDER — HYDROCOD POLST-CPM POLST ER 10-8 MG/5ML PO SUER: 5.0000 mL | Freq: Two times a day (BID) | ORAL | 0 refills | Status: DC | PRN

## 2016-08-10 MED ORDER — PANTOPRAZOLE SODIUM 40 MG PO TBEC: 40.0000 mg | DELAYED_RELEASE_TABLET | Freq: Every day | ORAL | 3 refills | Status: DC

## 2016-08-10 NOTE — Assessment & Plan Note (Signed)
Has not taken her meds in one week - when she swallows them they come back up - able to swallow small pills - may be related to the size of the metformin Will follow up with Dr Loanne Drilling If she is unable to take pills she will let us know - try taking them separately

## 2016-08-10 NOTE — Assessment & Plan Note (Signed)
Associated with reflux of pills - no heartburn Will start protonix for possible gastritis/GERD Follow up in few weeks - sooner if no improvement/worsening of symptoms

## 2016-08-10 NOTE — Progress Notes (Signed)
Subjective:    Patient ID: Jill [name], female    DOB: 05-Nov-1960, 56 y.o.   MRN: [id number]  HPI She is here to establish with a new pcp.    She just finished antibiotics for a bladder infection.  She is still spotting a litte, but it is from the IUD she thinks - it is not new.  She completed one week of macrobid. She denies any dysuria, increased urinary frequency/urgency now.     Diabetes: she follows with Dr Loanne Drilling.  She is taking her medication daily as prescribed. She is fairly compliant with a diabetic diet.    Abdominal pain:  It is in her epigastric region.  It was intermittent when it started about 6 months ago, but has gotten worse over the past 4 months.  She had gerd 20 years ago.  She denies gerd now.  She has a constant ache there and she had increased pain on a few occasions.  She has not taken her metformin, tradjenta and jardiance in a week.  The metformin is big and she has difficulty swallowing it.  She takes them all in the am and has not tried taking the others without the metformin.  When she swallows them they come right back up.   She has been able to take the losartan - it is very small.    She denies difficulty swallowing liquids.  She typically eats soft food and denies difficulty swallowing food.    She took gel advil with a recent cold, but has not taken it long term.  She dis have difficulty swallowing them - but has always had diffficlty swallowing pills. She does not take nsaids chronically.    Cold symptoms:  She has had symptoms for one week.  Cough -some time productive of clear - yellow mucus.  She has had a little sob today.  No wheeze.  H/o bronchitis ,PNA once in past.  .  She feels this is just a cold.  She has been taking Robitussin dm.  Medications and allergies reviewed with patient and updated if appropriate.  Patient Active Problem List   Diagnosis Date Noted  . IUD (intrauterine device) in place 07/05/2016  . Menorrhagia with  irregular cycle 05/03/2016  . Fibroids, intramural 05/03/2016  . Pelvic kidney   . Diabetes mellitus without complication ([place])   . OBESITY, MORBID 07/30/2009  . DYSPNEA ON EXERTION 07/30/2009  . Diabetes ([place]) 05/19/2009  . OBSTRUCTIVE SLEEP APNEA 05/19/2009  . FATIGUE 05/19/2009  . SYMPTOM, DISTURBANCE, SLEEP NOS 06/01/2007  . MOTION SICKNESS 03/12/2007  . ASTHMA 01/31/2007  . GERD 01/31/2007  . MIGRAINE, MENSTRUAL 01/31/2007    Current Outpatient Prescriptions on File Prior to Visit  Medication Sig Dispense Refill  . empagliflozin (JARDIANCE) 25 MG TABS tablet Take 25 mg by mouth daily. 30 tablet 11  . glucose blood (ONETOUCH VERIO) test strip 1 each by Other route daily. And lancets 1/day 100 each 12  . glucose blood test strip Test blood sugar daily. E11.9 100 each 3  . linagliptin (TRADJENTA) 5 MG TABS tablet Take 1 tablet (5 mg total) by mouth daily. 30 tablet 11  . losartan (COZAAR) 25 MG tablet Take 1 tablet (25 mg total) by mouth daily. 30 tablet 13  . metFORMIN (GLUCOPHAGE-XR) 750 MG 24 hr tablet Take 1 tablet (750 mg total) by mouth daily with breakfast. 30 tablet 13  . ONE TOUCH LANCETS MISC TAKE 1 TABLET BY MOUTH EVERY DAY  12  .  Vitamin D, Ergocalciferol, (DRISDOL) 50000 units CAPS capsule Take 1 capsule (50,000 Units total) by mouth every 7 (seven) days. 12 capsule 4   No current facility-administered medications on file prior to visit.     Past Medical History:  Diagnosis Date  . Allergy   . Anemia   . Asthma    37yrs ago  . Chronic deafness of both ears    left ear has 40% hearing-right ear has 60 % hearing  . Complication of anesthesia    Hard to wake up  . Diabetes mellitus without complication ([place])   . Dysmenorrhea   . Migraines    none in last 11 yrs  . Pelvic kidney    left  . Post partum depression   . Urinary incontinence     Past Surgical History:  Procedure Laterality Date  . CESAREAN SECTION    . COLONOSCOPY WITH PROPOFOL N/A 03/24/2015    Procedure: COLONOSCOPY WITH PROPOFOL;  Surgeon: Juanita Craver, MD;  Location: [facility];  Service: Endoscopy;  Laterality: N/A;  . KNEE ARTHROSCOPY WITH MEDIAL MENISECTOMY Left 04/17/2014   Procedure: LEFT KNEE ARTHROSCOPY WITH PARTIAL MEDIAL MENISCECTOMY AND CHONDROPLASTY;  Surgeon: Johnn Hai, MD;  Location: [facility];  Service: Orthopedics;  Laterality: Left;  . TYMPANOSTOMY TUBE PLACEMENT      Social History   Social History  . Marital status: Single    Spouse name: N/A  . Number of children: N/A  . Years of education: N/A   Occupational History  . social services case worker   .  [place] History Main Topics  . Smoking status: Never Smoker  . Smokeless tobacco: Never Used  . Alcohol use No  . Drug use: No  . Sexual activity: No   Other Topics Concern  . Not on file   Social History Narrative   Single. Education: [employer].     Family History  Problem Relation Age of Onset  . Hypertension Mother   . Arthritis Mother   . Other Sister     blood clot  . Cancer Sister   . Hypertension Sister   . Diabetes Maternal Grandmother   . Heart failure Maternal Grandmother   . Heart disease Maternal Grandmother   . Hyperlipidemia Maternal Grandmother   . Hypertension Maternal Grandmother   . Hypertension Maternal Grandfather   . Hyperlipidemia Maternal Grandfather   . Breast cancer Paternal Aunt   . Cancer Paternal Grandfather     Review of Systems  Constitutional: Negative for fever.  HENT: Positive for congestion and sore throat. Negative for ear pain, sinus pain and sinus pressure.   Respiratory: Positive for cough (with recent URI). Negative for shortness of breath and wheezing.   Cardiovascular: Negative for chest pain, palpitations and leg swelling.  Gastrointestinal: Negative for abdominal pain, blood in stool, constipation, diarrhea and nausea.       No GERD  Genitourinary: Positive for hematuria. Negative for dysuria and frequency.    Neurological: Positive for headaches (posterior head x few days). Negative for dizziness and light-headedness.  Psychiatric/Behavioral: Negative for dysphoric mood. The patient is not nervous/anxious.        Objective:   Vitals:   08/10/16 1503  BP: 134/70  Pulse: 86  Resp: 16  Temp: 98.6 F (37 C)   Filed Weights   08/10/16 1503  Weight: 259 lb (117.5 kg)   Body mass index is 44.46 kg/m.  Wt Readings from Last 3 Encounters:  08/10/16 259 lb (  117.5 kg)  08/03/16 252 lb (114.3 kg)  07/04/16 259 lb 12.8 oz (117.8 kg)     Physical Exam  Constitutional: She appears well-developed and well-nourished. No distress.  HENT:  Head: Normocephalic and atraumatic.  Right Ear: External ear normal.  Left Ear: External ear normal.  Mouth/Throat: Oropharynx is clear and moist. No oropharyngeal exudate.  B/l ear canals and TM normal  Eyes: Conjunctivae are normal.  Neck: Neck supple. No tracheal deviation present. No thyromegaly present.  Cardiovascular: Normal rate, regular rhythm and normal heart sounds.   No murmur heard. Pulmonary/Chest: Effort normal and breath sounds normal. No respiratory distress. She has no wheezes. She has no rales.  Abdominal: Soft. She exhibits no distension and no mass. There is tenderness (epigastric region and LUQ). There is no rebound and no guarding.  Musculoskeletal: She exhibits no edema.  Lymphadenopathy:    She has no cervical adenopathy.  Skin: Skin is warm and dry. She is not diaphoretic.  Psychiatric: She has a normal mood and affect. Her behavior is normal.          Assessment & Plan:   See Problem List for Assessment and Plan of chronic medical problems.

## 2016-08-10 NOTE — Assessment & Plan Note (Signed)
Likely viral in nature Will hold off on antibiotic for now Start tussionex cough syrup at night Rest, fluids Call if no improvement Symptomatic treatment

## 2016-08-10 NOTE — Assessment & Plan Note (Signed)
Completed one week macrobid Recheck UA, UCx at patients request No symptoms curenlty

## 2016-08-10 NOTE — Assessment & Plan Note (Signed)
Having epigastric pain  - ? Possible gastritis Start portonix 40 mg daily - will not keep her on it long term - if symptoms improve will change to zantac/pepcid If no improvement she will let me know If symptoms worsen she will return

## 2016-08-10 NOTE — Progress Notes (Signed)
Pre visit review using our clinic review tool, if applicable. No additional management support is needed unless otherwise documented below in the visit note. 

## 2016-08-10 NOTE — Patient Instructions (Addendum)
  Test(s) ordered today. Your results will be released to [place] (or called to you) after review, usually within 72hours after test completion. If any changes need to be made, you will be notified at that same time.   Medications reviewed and updated.  Changes include starting protonix daily for your stomach pain.   Your prescription(s) have been submitted to your pharmacy. Please take as directed and contact our office if you believe you are having problem(s) with the medication(s).   Please followup in 4 weeks

## 2016-08-11 LAB — URINE CULTURE

## 2016-08-13 ENCOUNTER — Encounter: Payer: Self-pay | Admitting: Internal Medicine

## 2016-08-13 DIAGNOSIS — R3129 Other microscopic hematuria: Secondary | ICD-10-CM

## 2016-09-07 ENCOUNTER — Ambulatory Visit (INDEPENDENT_AMBULATORY_CARE_PROVIDER_SITE_OTHER): Payer: 59 | Admitting: Internal Medicine

## 2016-09-07 ENCOUNTER — Encounter: Payer: Self-pay | Admitting: Internal Medicine

## 2016-09-07 VITALS — BP 128/72 | HR 109 | Temp 97.8°F | Resp 16 | Wt 256.0 lb

## 2016-09-07 DIAGNOSIS — G8929 Other chronic pain: Secondary | ICD-10-CM

## 2016-09-07 DIAGNOSIS — K2100 Gastro-esophageal reflux disease with esophagitis, without bleeding: Secondary | ICD-10-CM

## 2016-09-07 DIAGNOSIS — R1013 Epigastric pain: Secondary | ICD-10-CM

## 2016-09-07 DIAGNOSIS — R319 Hematuria, unspecified: Secondary | ICD-10-CM | POA: Diagnosis not present

## 2016-09-07 NOTE — Progress Notes (Signed)
Pre visit review using our clinic review tool, if applicable. No additional management support is needed unless otherwise documented below in the visit note. 

## 2016-09-07 NOTE — Assessment & Plan Note (Signed)
Recent UA normal - no evidence of infection Will refer to urology

## 2016-09-07 NOTE — Patient Instructions (Addendum)
    Medications reviewed and updated.  No changes recommended at this time.   A referral was ordered for for urology and GI.  Please followup in 6 months

## 2016-09-07 NOTE — Assessment & Plan Note (Signed)
Improved pain and swallowing issues Likely gastritis or gerd Continue protonix daily Avoid greasy food/ fatty foods Will refer to GI for further evaluation

## 2016-09-07 NOTE — Assessment & Plan Note (Signed)
Likely having atypical gerd or gastritis - no gerd symptoms GI symptoms improved with protonix Continue protonix Will refer to GI for further evaluation

## 2016-09-07 NOTE — Progress Notes (Signed)
Subjective:    Patient ID: Jill [name], female    DOB: 1961-04-27, 56 y.o.   MRN: [id number]  HPI The patient is here for follow up.  Abdominal pain:  She is still having epigastric abdominal pain.  If she has a coughing spell she will throw up.  She can not eat fried or greasy food - she will throw up.  She still has a nagging daily achy pain in her epigastric area. It is occasionally sharp pain. Her symptoms are better since starting the protonix daily. She is swallowing better - she is not throwing up her medication.  She denies GERD.  She does feel like something is in her throat.   Cough:  She feels like something is stuck in her throat or a tickle in her throat.  She will wake up in the middle of the night and cough.  If her throat gets dry she will cough.  She has noticed this more since she was here last.  The cough is dry.  She denies wheeze and sob.   Blood in urine:  Her urine looks clean, but when she wipes she sees blood clots.  When she loooks at her urine she will see clots of tissue or mucus.  She denies pain with urination.  She denies abdominal pain.  Her urine earlier this month was normal.    Medications and allergies reviewed with patient and updated if appropriate.  Patient Active Problem List   Diagnosis Date Noted  . Abdominal pain, chronic, epigastric 08/10/2016  . Urinary tract infection without hematuria 08/10/2016  . Upper respiratory tract infection 08/10/2016  . IUD (intrauterine device) in place 07/05/2016  . Menorrhagia with irregular cycle 05/03/2016  . Fibroids, intramural 05/03/2016  . Pelvic kidney   . Diabetes mellitus without complication ([place])   . OBESITY, MORBID 07/30/2009  . DYSPNEA ON EXERTION 07/30/2009  . OBSTRUCTIVE SLEEP APNEA 05/19/2009  . FATIGUE 05/19/2009  . SYMPTOM, DISTURBANCE, SLEEP NOS 06/01/2007  . MOTION SICKNESS 03/12/2007  . ASTHMA 01/31/2007  . GERD 01/31/2007  . MIGRAINE, MENSTRUAL 01/31/2007    Current  Outpatient Prescriptions on File Prior to Visit  Medication Sig Dispense Refill  . empagliflozin (JARDIANCE) 25 MG TABS tablet Take 25 mg by mouth daily. 30 tablet 11  . glucose blood (ONETOUCH VERIO) test strip 1 each by Other route daily. And lancets 1/day 100 each 12  . glucose blood test strip Test blood sugar daily. E11.9 100 each 3  . linagliptin (TRADJENTA) 5 MG TABS tablet Take 1 tablet (5 mg total) by mouth daily. 30 tablet 11  . losartan (COZAAR) 25 MG tablet Take 1 tablet (25 mg total) by mouth daily. 30 tablet 13  . metFORMIN (GLUCOPHAGE-XR) 750 MG 24 hr tablet Take 1 tablet (750 mg total) by mouth daily with breakfast. 30 tablet 13  . ONE TOUCH LANCETS MISC TAKE 1 TABLET BY MOUTH EVERY DAY  12  . pantoprazole (PROTONIX) 40 MG tablet Take 1 tablet (40 mg total) by mouth daily. 30 tablet 3  . Vitamin D, Ergocalciferol, (DRISDOL) 50000 units CAPS capsule Take 1 capsule (50,000 Units total) by mouth every 7 (seven) days. 12 capsule 4   No current facility-administered medications on file prior to visit.     Past Medical History:  Diagnosis Date  . Allergy   . Anemia   . Asthma    3yrs ago  . Chronic deafness of both ears    left ear has 40%  hearing-right ear has 60 % hearing  . Complication of anesthesia    Hard to wake up  . Diabetes mellitus without complication ([place])   . Dysmenorrhea   . Migraines    none in last 11 yrs  . Pelvic kidney    left  . Post partum depression   . Urinary incontinence     Past Surgical History:  Procedure Laterality Date  . CESAREAN SECTION    . COLONOSCOPY WITH PROPOFOL N/A 03/24/2015   Procedure: COLONOSCOPY WITH PROPOFOL;  Surgeon: Juanita Craver, MD;  [facility];  Service: Endoscopy;  Laterality: N/A;  . KNEE ARTHROSCOPY WITH MEDIAL MENISECTOMY Left 04/17/2014   Procedure: LEFT KNEE ARTHROSCOPY WITH PARTIAL MEDIAL MENISCECTOMY AND CHONDROPLASTY;  Surgeon: Johnn Hai, MD;  Location: [facility];  Service: Orthopedics;   Laterality: Left;  . TYMPANOSTOMY TUBE PLACEMENT      Social History   Social History  . Marital status: Single    Spouse name: N/A  . Number of children: N/A  . Years of education: N/A   Occupational History  . social services case worker   .  [place] History Main Topics  . Smoking status: Never Smoker  . Smokeless tobacco: Never Used  . Alcohol use No  . Drug use: No  . Sexual activity: No   Other Topics Concern  . Not on file   Social History Narrative   Single. Education: [employer].     Family History  Problem Relation Age of Onset  . Hypertension Mother   . Arthritis Mother   . Other Sister     blood clot  . Cancer Sister   . Hypertension Sister   . Diabetes Maternal Grandmother   . Heart failure Maternal Grandmother   . Heart disease Maternal Grandmother   . Hyperlipidemia Maternal Grandmother   . Hypertension Maternal Grandmother   . Hypertension Maternal Grandfather   . Hyperlipidemia Maternal Grandfather   . Breast cancer Paternal Aunt   . Cancer Paternal Grandfather     Review of Systems  Constitutional: Negative for fever.  HENT: Negative for sore throat, trouble swallowing and voice change.   Respiratory: Positive for cough.   Cardiovascular: Negative for chest pain and palpitations.  Gastrointestinal: Positive for abdominal pain (epigsatric region). Negative for nausea.       No gerd  Genitourinary: Positive for hematuria. Negative for dysuria (discomfort at end of urination).       Objective:   Vitals:   09/07/16 1544  BP: 128/72  Pulse: (!) 109  Resp: 16  Temp: 97.8 F (36.6 C)   Wt Readings from Last 3 Encounters:  09/07/16 256 lb (116.1 kg)  08/10/16 259 lb (117.5 kg)  08/03/16 252 lb (114.3 kg)   Body mass index is 43.94 kg/m.   Physical Exam    Constitutional: Appears well-developed and well-nourished. No distress.  HENT:  Head: Normocephalic and atraumatic.  Neck: Neck supple. No tracheal deviation  present. No thyromegaly present.  No cervical lymphadenopathy Cardiovascular: Normal rate, regular rhythm and normal heart sounds.   No murmur heard. No carotid bruit .  No edema Pulmonary/Chest: Effort normal and breath sounds normal. No respiratory distress. No has no wheezes. No rales.  Abdomen: soft, non tender, non distended Skin: Skin is warm and dry. Not diaphoretic.  Psychiatric: Normal mood and affect. Behavior is normal.      Assessment & Plan:    See Problem List for Assessment and Plan of chronic  medical problems.

## 2016-09-08 ENCOUNTER — Encounter: Payer: Self-pay | Admitting: Gastroenterology

## 2016-09-09 ENCOUNTER — Encounter: Payer: Self-pay | Admitting: Internal Medicine

## 2016-09-16 ENCOUNTER — Encounter: Payer: Self-pay | Admitting: Internal Medicine

## 2016-09-27 ENCOUNTER — Encounter: Payer: Self-pay | Admitting: Endocrinology

## 2016-09-27 ENCOUNTER — Ambulatory Visit (INDEPENDENT_AMBULATORY_CARE_PROVIDER_SITE_OTHER): Payer: 59 | Admitting: Endocrinology

## 2016-09-27 VITALS — BP 126/68 | HR 103 | Ht 62.0 in | Wt 254.0 lb

## 2016-09-27 DIAGNOSIS — E119 Type 2 diabetes mellitus without complications: Secondary | ICD-10-CM | POA: Diagnosis not present

## 2016-09-27 LAB — POCT GLYCOSYLATED HEMOGLOBIN (HGB A1C): Hemoglobin A1C: 7.4

## 2016-09-27 MED ORDER — REPAGLINIDE 1 MG PO TABS: 1.0000 mg | ORAL_TABLET | Freq: Two times a day (BID) | ORAL | 11 refills | Status: DC

## 2016-09-27 NOTE — Patient Instructions (Addendum)
check your blood sugar a day.  vary the time of day when you check, between before the 3 meals, and at bedtime.  also check if you have symptoms of your blood sugar being too high or too low.  please keep a record of the readings and bring it to your next appointment here (or you can bring the meter itself).  You can write it on any piece of paper.  please call us sooner if your blood sugar goes below 70, or if you have a lot of readings over 200. I have sent a prescription to your pharmacy, to add "repaglinide."  Please come back for a follow-up appointment in 3 months.

## 2016-09-27 NOTE — Progress Notes (Signed)
Subjective:    Patient ID: Jill [name], female    DOB: [birth year], 56 y.o.   MRN: [id number]  HPI Pt returns for f/u of diabetes mellitus: DM type: 2 Dx'ed: 123456 Complications: none Therapy: 3 oral meds GDM: never DKA: never Severe hypoglycemia: never Pancreatitis: never Other: she has never been on insulin; she declines bariatric surgery; edema limits oral DM rx options. Interval history: no cbg record, but states cbg's are well-controlled.  pt states she feels well in general.  Past Medical History:  Diagnosis Date  . Allergy   . Anemia   . Asthma    92yrs ago  . Chronic deafness of both ears    left ear has 40% hearing-right ear has 60 % hearing  . Complication of anesthesia    Hard to wake up  . Diabetes mellitus without complication ([place])   . Dysmenorrhea   . Migraines    none in last 11 yrs  . Pelvic kidney    left  . Post partum depression   . Urinary incontinence     Past Surgical History:  Procedure Laterality Date  . CESAREAN SECTION    . COLONOSCOPY WITH PROPOFOL N/A 03/24/2015   Procedure: COLONOSCOPY WITH PROPOFOL;  Surgeon: Juanita Craver, MD;  [facility];  Service: Endoscopy;  Laterality: N/A;  . KNEE ARTHROSCOPY WITH MEDIAL MENISECTOMY Left 04/17/2014   Procedure: LEFT KNEE ARTHROSCOPY WITH PARTIAL MEDIAL MENISCECTOMY AND CHONDROPLASTY;  Surgeon: Johnn Hai, MD;  Location[facility];  Service: Orthopedics;  Laterality: Left;  . TYMPANOSTOMY TUBE PLACEMENT      Social History   Social History  . Marital status: Single    Spouse name: N/A  . Number of children: N/A  . Years of education: N/A   Occupational History  . social services case worker   .  [place] History Main Topics  . Smoking status: Never Smoker  . Smokeless tobacco: Never Used  . Alcohol use No  . Drug use: No  . Sexual activity: No   Other Topics Concern  . Not on file   Social History Narrative   Single. Education: [employer].      Current Outpatient Prescriptions on File Prior to Visit  Medication Sig Dispense Refill  . empagliflozin (JARDIANCE) 25 MG TABS tablet Take 25 mg by mouth daily. 30 tablet 11  . glucose blood (ONETOUCH VERIO) test strip 1 each by Other route daily. And lancets 1/day 100 each 12  . glucose blood test strip Test blood sugar daily. E11.9 100 each 3  . linagliptin (TRADJENTA) 5 MG TABS tablet Take 1 tablet (5 mg total) by mouth daily. 30 tablet 11  . losartan (COZAAR) 25 MG tablet Take 1 tablet (25 mg total) by mouth daily. 30 tablet 13  . metFORMIN (GLUCOPHAGE-XR) 750 MG 24 hr tablet Take 1 tablet (750 mg total) by mouth daily with breakfast. 30 tablet 13  . ONE TOUCH LANCETS MISC TAKE 1 TABLET BY MOUTH EVERY DAY  12  . pantoprazole (PROTONIX) 40 MG tablet Take 1 tablet (40 mg total) by mouth daily. 30 tablet 3   No current facility-administered medications on file prior to visit.     Allergies  Allergen Reactions  . Adhesive [Tape]     Pulls skin off  . Latex Itching  . Toradol [Ketorolac Tromethamine]     Joint pain and swelling  . Tramadol Other (See Comments)    JOINT PAIN    Family History  Problem Relation Age of Onset  . Hypertension Mother   . Arthritis Mother   . Other Sister     blood clot  . Cancer Sister   . Hypertension Sister   . Diabetes Maternal Grandmother   . Heart failure Maternal Grandmother   . Heart disease Maternal Grandmother   . Hyperlipidemia Maternal Grandmother   . Hypertension Maternal Grandmother   . Hypertension Maternal Grandfather   . Hyperlipidemia Maternal Grandfather   . Breast cancer Paternal Aunt   . Cancer Paternal Grandfather     BP 126/68   Pulse (!) 103   Ht 5\' 2"  (1.575 m)   Wt 254 lb (115.2 kg)   SpO2 96%   BMI 46.46 kg/m    Review of Systems She denies hypoglycemia.      Objective:   Physical Exam VITAL SIGNS:  See vs page GENERAL: no distress.  Morbid obesity.  Pulses: dorsalis pedis intact bilat.   MSK: no  deformity of the feet CV: trace bilat leg edema Skin:  no ulcer on the feet.  normal color and temp on the feet.  Neuro: sensation is intact to touch on the feet.   A1c=7.4%    Assessment & Plan:  Type 2 DM: she needs increased rx.   Patient is advised the following: Patient Instructions  check your blood sugar a day.  vary the time of day when you check, between before the 3 meals, and at bedtime.  also check if you have symptoms of your blood sugar being too high or too low.  please keep a record of the readings and bring it to your next appointment here (or you can bring the meter itself).  You can write it on any piece of paper.  please call us sooner if your blood sugar goes below 70, or if you have a lot of readings over 200. I have sent a prescription to your pharmacy, to add "repaglinide."  Please come back for a follow-up appointment in 3 months.

## 2016-09-30 DIAGNOSIS — Q632 Ectopic kidney: Secondary | ICD-10-CM | POA: Diagnosis not present

## 2016-09-30 DIAGNOSIS — E119 Type 2 diabetes mellitus without complications: Secondary | ICD-10-CM | POA: Diagnosis not present

## 2016-09-30 DIAGNOSIS — R310 Gross hematuria: Secondary | ICD-10-CM | POA: Diagnosis not present

## 2016-10-04 DIAGNOSIS — R319 Hematuria, unspecified: Secondary | ICD-10-CM | POA: Diagnosis not present

## 2016-10-04 DIAGNOSIS — R310 Gross hematuria: Secondary | ICD-10-CM | POA: Diagnosis not present

## 2016-10-13 ENCOUNTER — Encounter: Payer: Self-pay | Admitting: Gastroenterology

## 2016-10-13 ENCOUNTER — Ambulatory Visit (INDEPENDENT_AMBULATORY_CARE_PROVIDER_SITE_OTHER): Payer: 59 | Admitting: Gastroenterology

## 2016-10-13 VITALS — BP 126/78 | HR 76 | Ht 62.6 in | Wt 255.0 lb

## 2016-10-13 DIAGNOSIS — Z860100 Personal history of colon polyps, unspecified: Secondary | ICD-10-CM

## 2016-10-13 DIAGNOSIS — R1013 Epigastric pain: Secondary | ICD-10-CM

## 2016-10-13 DIAGNOSIS — K219 Gastro-esophageal reflux disease without esophagitis: Secondary | ICD-10-CM

## 2016-10-13 DIAGNOSIS — R1110 Vomiting, unspecified: Secondary | ICD-10-CM

## 2016-10-13 DIAGNOSIS — IMO0001 Reserved for inherently not codable concepts without codable children: Secondary | ICD-10-CM

## 2016-10-13 MED ORDER — RANITIDINE HCL 300 MG PO TABS: 300.0000 mg | ORAL_TABLET | Freq: Every day | ORAL | 3 refills | Status: DC

## 2016-10-13 NOTE — Progress Notes (Signed)
Jill [name]    [id number]    [birth year]  Primary Care Physician:Stacy Lorretta Harp, MD  Referring Physician: Binnie Rail, MD [place], [place] [address]  Chief complaint:  Epigastric pain, regurgitation  HPI: 56 yr F with morbid obesity, Diabetes Mellitus previously followed by Dr. Collene Mares is here for new patient visit with complaints of epigastric pain , regurgitation and dysphagia She is having trouble swallowing large pills,  immediately regurgitates back to tablet. She also notices difficulty swallowing with food, sometimes it takes longer to go down and she feels as if the food is scratching of grating the throat but she doesn't regurgitate food. No difficulty swallowing with liquids. She has been having intermittent difficulty swallowing with solids and pills and regurgitation for past 20 years or so.  Epigastric pain feels like a constant ache. No difference with meals. Red meats irritates and makes the discomfort worse. She doesn't eat salads or raw vegetables. She was started on Protonix by Dr. Quay Burow in month and half ago and feels epigastric pain is better but she is continuing to regurgitate. She is diagnosed with diabetes about 10 years ago, last A1c 7.4. Denies any nausea, vomiting, melena or blood per rectum Last colonoscopy in 2016 by Dr. Man with removal of small polyp in the proximal ascending colon, pathology reported it as a hyperplastic polyp given its right-sided polyp likely is a sessile serrated adenoma   Outpatient Encounter Prescriptions as of 10/13/2016  Medication Sig  . empagliflozin (JARDIANCE) 25 MG TABS tablet Take 25 mg by mouth daily.  Marland Kitchen glucose blood (ONETOUCH VERIO) test strip 1 each by Other route daily. And lancets 1/day  . glucose blood test strip Test blood sugar daily. E11.9  . linagliptin (TRADJENTA) 5 MG TABS tablet Take 1 tablet (5 mg total) by mouth daily.  Marland Kitchen losartan (COZAAR) 25 MG tablet Take 1 tablet (25 mg total) by  mouth daily.  . metFORMIN (GLUCOPHAGE-XR) 750 MG 24 hr tablet Take 1 tablet (750 mg total) by mouth daily with breakfast.  . ONE TOUCH LANCETS MISC TAKE 1 TABLET BY MOUTH EVERY DAY  . pantoprazole (PROTONIX) 40 MG tablet Take 1 tablet (40 mg total) by mouth daily.  . repaglinide (PRANDIN) 1 MG tablet Take 1 tablet (1 mg total) by mouth 2 (two) times daily before a meal.   No facility-administered encounter medications on file as of 10/13/2016.     Allergies as of 10/13/2016 - Review Complete 10/13/2016  Allergen Reaction Noted  . Adhesive [tape]  04/10/2014  . Latex Itching 04/10/2014  . Toradol [ketorolac tromethamine]  01/01/2015  . Tramadol Other (See Comments) 04/17/2014    Past Medical History:  Diagnosis Date  . Allergy   . Anemia   . Arthritis   . Asthma    30yrs ago  . Chronic deafness of both ears    left ear has 40% hearing-right ear has 60 % hearing  . Colon polyps   . Complication of anesthesia    Hard to wake up  . Diabetes mellitus without complication ([place])   . Dysmenorrhea   . Migraines    none in last 11 yrs  . Pelvic kidney    left  . Pneumonia   . Post partum depression   . Urinary incontinence     Past Surgical History:  Procedure Laterality Date  . CESAREAN SECTION    . COLONOSCOPY WITH PROPOFOL N/A 03/24/2015  Procedure: COLONOSCOPY WITH PROPOFOL;  Surgeon: Juanita Craver, MD;  Location: [facility];  Service: Endoscopy;  Laterality: N/A;  . KNEE ARTHROSCOPY WITH MEDIAL MENISECTOMY Left 04/17/2014   Procedure: LEFT KNEE ARTHROSCOPY WITH PARTIAL MEDIAL MENISCECTOMY AND CHONDROPLASTY;  Surgeon: Johnn Hai, MD;  Location: [facility];  Service: Orthopedics;  Laterality: Left;  . TYMPANOSTOMY TUBE PLACEMENT Right     Family History  Problem Relation Age of Onset  . Hypertension Mother   . Arthritis Mother   . Hypertension Sister   . Deep vein thrombosis Sister   . Pulmonary embolism Sister   . Diabetes Maternal Grandmother   . Heart failure  Maternal Grandmother   . Heart disease Maternal Grandmother   . Hyperlipidemia Maternal Grandmother   . Hypertension Maternal Grandmother   . Hypertension Maternal Grandfather   . Hyperlipidemia Maternal Grandfather   . Breast cancer Paternal Aunt   . Breast cancer Paternal Grandmother     Social History   Social History  . Marital status: Single    Spouse name: N/A  . Number of children: 1  . Years of education: N/A   Occupational History  . social services case worker   .  [place] History Main Topics  . Smoking status: Never Smoker  . Smokeless tobacco: Never Used  . Alcohol use 0.0 oz/week     Comment: rarely  . Drug use: No  . Sexual activity: No   Other Topics Concern  . Not on file   Social History Narrative   Single. Education: [employer].       Review of systems: Review of Systems  Constitutional: Negative for fever and chills.  HENT: Negative.   Eyes: Negative for blurred vision.  Respiratory: Negative for cough, shortness of breath and wheezing.   Cardiovascular: Negative for chest pain and palpitations.  Gastrointestinal: as per HPI Genitourinary: Negative for dysuria, urgency, frequency and hematuria.  Musculoskeletal: Positive for myalgias, back pain and joint pain.  Skin: Negative for itching and rash.  Neurological: Negative for dizziness, tremors, focal weakness, seizures and loss of consciousness.  Endo/Heme/Allergies: Positive for seasonal allergies.  Psychiatric/Behavioral: Negative for depression, suicidal ideas and hallucinations.  All other systems reviewed and are negative.   Physical Exam: Vitals:   10/13/16 0826  BP: 126/78  Pulse: 76   Body mass index is 45.75 kg/m. Gen:      No acute distress, morbidly obese HEENT:  EOMI, sclera anicteric Neck:     No masses; no thyromegaly Lungs:    Clear to auscultation bilaterally; normal respiratory effort CV:         Regular rate and rhythm; no murmurs Abd:      + bowel  sounds; soft, non-tender; palpable L anterior kidney, no distension Ext:    No edema; adequate peripheral perfusion Skin:      Warm and dry; no rash Neuro: alert and oriented x 3 Psych: normal mood and affect  Data Reviewed:  Reviewed labs, radiology imaging, old records and pertinent past GI work up  Colonoscopy August 2016 by Dr Collene Mares: diminutive polyp removed from proximal ascending colon, hyperplastic on pathology but likely sessile serrated adenoma given its a right sided hyperplastic polyp  Assessment and Plan/Recommendations: 56 year old female with morbid obesity, diabetes, for evaluation with complaints of epigastric abdominal pain, regurgitation and dysphagia We will schedule for EGD for evaluation, to exclude esophagitis, peptic stricture or large hiatal hernia The risks and benefits as well as alternatives of endoscopic procedure(s) have  been discussed and reviewed. All questions answered. The patient agrees to proceed. Protonix 40 mg daily, 30 minutes before breakfast Start ranitidine 300 mg at bedtime daily Discussed antireflux measures in detail  Due for recall colonoscopy in August 2021   Greater than 50% of the time used for counseling as well as treatment plan and follow-up. She had multiple questions which were answered to her satisfaction  K. Denzil Magnuson , MD [phone] Mon-Fri 8a-5p [phone] after 5p, weekends, holidays  CC: Burns, Claudina Lick, MD

## 2016-10-13 NOTE — Patient Instructions (Signed)
You have been scheduled for an endoscopy. Please follow written instructions given to you at your visit today. If you use inhalers (even only as needed), please bring them with you on the day of your procedure. Your physician has requested that you go to www.startemmi.com and enter the access code given to you at your visit today. This web site gives a general overview about your procedure. However, you should still follow specific instructions given to you by our office regarding your preparation for the procedure.  Continue Protonix daily 30 minutes before breakfast  Use Zantac 300 mg at bedtime as needed   Food Choices for Gastroesophageal Reflux Disease, Adult When you have gastroesophageal reflux disease (GERD), the foods you eat and your eating habits are very important. Choosing the right foods can help ease your discomfort. What guidelines do I need to follow?  Choose fruits, vegetables, whole grains, and low-fat dairy products.  Choose low-fat meat, fish, and poultry.  Limit fats such as oils, salad dressings, butter, nuts, and avocado.  Keep a food diary. This helps you identify foods that cause symptoms.  Avoid foods that cause symptoms. These may be different for everyone.  Eat small meals often instead of 3 large meals a day.  Eat your meals slowly, in a place where you are relaxed.  Limit fried foods.  Cook foods using methods other than frying.  Avoid drinking alcohol.  Avoid drinking large amounts of liquids with your meals.  Avoid bending over or lying down until 2-3 hours after eating. What foods are not recommended? These are some foods and drinks that may make your symptoms worse: Vegetables  Tomatoes. Tomato juice. Tomato and spaghetti sauce. Chili peppers. Onion and garlic. Horseradish. Fruits  Oranges, grapefruit, and lemon (fruit and juice). Meats  High-fat meats, fish, and poultry. This includes hot dogs, ribs, ham, sausage, salami, and  bacon. Dairy  Whole milk and chocolate milk. Sour cream. Cream. Butter. Ice cream. Cream cheese. Drinks  Coffee and tea. Bubbly (carbonated) drinks or energy drinks. Condiments  Hot sauce. Barbecue sauce. Sweets/Desserts  Chocolate and cocoa. Donuts. Peppermint and spearmint. Fats and Oils  High-fat foods. This includes [place] fries and potato chips. Other  Vinegar. Strong spices. This includes black pepper, white pepper, red pepper, cayenne, curry powder, cloves, ginger, and chili powder. The items listed above may not be a complete list of foods and drinks to avoid. Contact your dietitian for more information.  This information is not intended to replace advice given to you by your health care provider. Make sure you discuss any questions you have with your health care provider. Document Released: 01/24/2012 Document Revised: 12/31/2015 Document Reviewed: 05/29/2013 Elsevier Interactive Patient Education  [address].   Gastroesophageal Reflux Disease, Adult Normally, food travels down the esophagus and stays in the stomach to be digested. If a person has gastroesophageal reflux disease (GERD), food and stomach acid move back up into the esophagus. When this happens, the esophagus becomes sore and swollen (inflamed). Over time, GERD can make small holes (ulcers) in the lining of the esophagus. Follow these instructions at home: Diet   Follow a diet as told by your doctor. You may need to avoid foods and drinks such as:  Coffee and tea (with or without caffeine).  Drinks that contain alcohol.  Energy drinks and sports drinks.  Carbonated drinks or sodas.  Chocolate and cocoa.  Peppermint and mint flavorings.  Garlic and onions.  Horseradish.  Spicy and acidic foods, such as  peppers, chili powder, curry powder, vinegar, hot sauces, and BBQ sauce.  Citrus fruit juices and citrus fruits, such as oranges, lemons, and limes.  Tomato-based foods, such as red sauce,  chili, salsa, and pizza with red sauce.  Fried and fatty foods, such as donuts, french fries, potato chips, and high-fat dressings.  High-fat meats, such as hot dogs, rib eye steak, sausage, ham, and bacon.  High-fat dairy items, such as whole milk, butter, and cream cheese.  Eat small meals often. Avoid eating large meals.  Avoid drinking large amounts of liquid with your meals.  Avoid eating meals during the 2-3 hours before bedtime.  Avoid lying down right after you eat.  Do not exercise right after you eat. General instructions   Pay attention to any changes in your symptoms.  Take over-the-counter and prescription medicines only as told by your doctor. Do not take aspirin, ibuprofen, or other NSAIDs unless your doctor says it is okay.  Do not use any tobacco products, including cigarettes, chewing tobacco, and e-cigarettes. If you need help quitting, ask your doctor.  Wear loose clothes. Do not wear anything tight around your waist.  Raise (elevate) the head of your bed about 6 inches (15 cm).  Try to lower your stress. If you need help doing this, ask your doctor.  If you are overweight, lose an amount of weight that is healthy for you. Ask your doctor about a safe weight loss goal.  Keep all follow-up visits as told by your doctor. This is important. Contact a doctor if:  You have new symptoms.  You lose weight and you do not know why it is happening.  You have trouble swallowing, or it hurts to swallow.  You have wheezing or a cough that keeps happening.  Your symptoms do not get better with treatment.  You have a hoarse voice. Get help right away if:  You have pain in your arms, neck, jaw, teeth, or back.  You feel sweaty, dizzy, or light-headed.  You have chest pain or shortness of breath.  You throw up (vomit) and your throw up looks like blood or coffee grounds.  You pass out (faint).  Your poop (stool) is bloody or black.  You cannot swallow,  drink, or eat. This information is not intended to replace advice given to you by your health care provider. Make sure you discuss any questions you have with your health care provider. Document Released: 01/11/2008 Document Revised: 12/31/2015 Document Reviewed: 11/19/2014 Elsevier Interactive Patient Education  2017 [employer].

## 2016-10-18 ENCOUNTER — Ambulatory Visit (AMBULATORY_SURGERY_CENTER): Payer: 59 | Admitting: Gastroenterology

## 2016-10-18 ENCOUNTER — Encounter: Payer: Self-pay | Admitting: Gastroenterology

## 2016-10-18 VITALS — BP 112/52 | HR 78 | Temp 98.8°F | Resp 16 | Ht 62.0 in | Wt 255.0 lb

## 2016-10-18 DIAGNOSIS — K209 Esophagitis, unspecified: Secondary | ICD-10-CM | POA: Diagnosis not present

## 2016-10-18 DIAGNOSIS — K219 Gastro-esophageal reflux disease without esophagitis: Secondary | ICD-10-CM | POA: Diagnosis not present

## 2016-10-18 DIAGNOSIS — R1013 Epigastric pain: Secondary | ICD-10-CM

## 2016-10-18 DIAGNOSIS — G4733 Obstructive sleep apnea (adult) (pediatric): Secondary | ICD-10-CM | POA: Diagnosis not present

## 2016-10-18 MED ORDER — SODIUM CHLORIDE 0.9 % IV SOLN: 500.0000 mL | INTRAVENOUS | Status: DC

## 2016-10-18 NOTE — Progress Notes (Signed)
Report to PACU, RN, vss, BBS= Clear.  

## 2016-10-18 NOTE — Patient Instructions (Signed)
YOU HAD AN ENDOSCOPIC PROCEDURE TODAY AT [facility]:   Refer to the procedure report that was given to you for any specific questions about what was found during the examination.  If the procedure report does not answer your questions, please call your gastroenterologist to clarify.  If you requested that your care partner not be given the details of your procedure findings, then the procedure report has been included in a sealed envelope for you to review at your convenience later.  YOU SHOULD EXPECT: Some feelings of bloating in the abdomen. Passage of more gas than usual.  Walking can help get rid of the air that was put into your GI tract during the procedure and reduce the bloating. If you had a lower endoscopy (such as a colonoscopy or flexible sigmoidoscopy) you may notice spotting of blood in your stool or on the toilet paper. If you underwent a bowel prep for your procedure, you may not have a normal bowel movement for a few days.  Please Note:  You might notice some irritation and congestion in your nose or some drainage.  This is from the oxygen used during your procedure.  There is no need for concern and it should clear up in a day or so.  SYMPTOMS TO REPORT IMMEDIATELY:    Following upper endoscopy (EGD)  Vomiting of blood or coffee ground material  New chest pain or pain under the shoulder blades  Painful or persistently difficult swallowing  New shortness of breath  Fever of 100F or higher  Black, tarry-looking stools  For urgent or emergent issues, a gastroenterologist can be reached at any hour by calling [phone].   DIET:  We do recommend a small meal at first, but then you may proceed to your regular diet.  Drink plenty of fluids but you should avoid alcoholic beverages for 24 hours.  ACTIVITY:  You should plan to take it easy for the rest of today and you should NOT DRIVE or use heavy machinery until tomorrow (because of the sedation medicines used  during the test).    FOLLOW UP: Our staff will call the number listed on your records the next business day following your procedure to check on you and address any questions or concerns that you may have regarding the information given to you following your procedure. If we do not reach you, we will leave a message.  However, if you are feeling well and you are not experiencing any problems, there is no need to return our call.  We will assume that you have returned to your regular daily activities without incident.  If any biopsies were taken you will be contacted by phone or by letter within the next 1-3 weeks.  Please call us at [phone] if you have not heard about the biopsies in 3 weeks.    SIGNATURES/CONFIDENTIALITY: You and/or your care partner have signed paperwork which will be entered into your electronic medical record.  These signatures attest to the fact that that the information above on your After Visit Summary has been reviewed and is understood.  Full responsibility of the confidentiality of this discharge information lies with you and/or your care-partner.  Esophagitis, and no aspirin, ibuprofen or naproxen or other non steroidal anti-inflammatory medications.

## 2016-10-18 NOTE — Progress Notes (Signed)
Called to room to assist during endoscopic procedure.  Patient ID and intended procedure confirmed with present staff. Received instructions for my participation in the procedure from the performing physician.  

## 2016-10-18 NOTE — Op Note (Signed)
[facility] Patient Name: Jill [name] Procedure Date: 10/18/2016 3:43 PM MRN: [id number] Endoscopist: Mauri Pole , MD Age: 56 Referring MD:  Date of Birth: [birth year] Gender: Female Account #: [id number] Procedure:                Upper GI endoscopy Indications:              Epigastric abdominal pain, Dyspepsia Medicines:                Monitored Anesthesia Care Procedure:                Pre-Anesthesia Assessment:                           - Prior to the procedure, a History and Physical                            was performed, and patient medications and                            allergies were reviewed. The patient's tolerance of                            previous anesthesia was also reviewed. The risks                            and benefits of the procedure and the sedation                            options and risks were discussed with the patient.                            All questions were answered, and informed consent                            was obtained. Prior Anticoagulants: The patient has                            taken no previous anticoagulant or antiplatelet                            agents. ASA Grade Assessment: III - A patient with                            severe systemic disease. After reviewing the risks                            and benefits, the patient was deemed in                            satisfactory condition to undergo the procedure.                           After obtaining informed consent, the endoscope was  passed under direct vision. Throughout the                            procedure, the patient's blood pressure, pulse, and                            oxygen saturations were monitored continuously. The                            Endoscope was introduced through the mouth, and                            advanced to the second part of duodenum. The upper                            GI  endoscopy was accomplished without difficulty.                            The patient tolerated the procedure well. Scope In: Scope Out: Findings:                 LA Grade A (one or more mucosal breaks less than 5                            mm, not extending between tops of 2 mucosal folds)                            esophagitis with no bleeding was found 34 to 35 cm                            from the incisors. Regular Z-line 34cm                           Patchy mildly erythematous mucosa without bleeding                            was found in the gastric antrum. Biopsies were                            taken with a cold forceps for Helicobacter pylori                            testing using CLOtest.                           The examined duodenum was normal. Complications:            No immediate complications. Estimated Blood Loss:     Estimated blood loss was minimal. Impression:               - LA Grade A esophagitis.                           - Erythematous mucosa in the antrum. Biopsied.                           -  Normal examined duodenum. Recommendation:           - Patient has a contact number available for                            emergencies. The signs and symptoms of potential                            delayed complications were discussed with the                            patient. Return to normal activities tomorrow.                            Written discharge instructions were provided to the                            patient.                           - Resume previous diet.                           - Continue present medications.                           - No aspirin, ibuprofen, naproxen, or other                            non-steroidal anti-inflammatory drugs.                           - Await pathology results.                           - No repeat upper endoscopy.                           - Return to GI office PRN. Mauri Pole, MD 10/18/2016  3:54:40 PM This report has been signed electronically.

## 2016-10-19 ENCOUNTER — Other Ambulatory Visit (INDEPENDENT_AMBULATORY_CARE_PROVIDER_SITE_OTHER): Payer: 59

## 2016-10-19 ENCOUNTER — Telehealth: Payer: Self-pay | Admitting: *Deleted

## 2016-10-19 DIAGNOSIS — R1013 Epigastric pain: Secondary | ICD-10-CM | POA: Diagnosis not present

## 2016-10-19 LAB — HELICOBACTER PYLORI SCREEN-BIOPSY: UREASE: NEGATIVE

## 2016-10-19 NOTE — Telephone Encounter (Signed)
  Follow up Call-  Call back number 10/18/2016  Post procedure Call Back phone  # [phone]  Permission to leave phone message Yes  Some recent data might be hidden     Patient questions:  Do you have a fever, pain , or abdominal swelling? No. Pain Score  0 *  Have you tolerated food without any problems? Yes.    Have you been able to return to your normal activities? Yes.    Do you have any questions about your discharge instructions: Diet   No. Medications  No. Follow up visit  No.  Do you have questions or concerns about your Care? No.  Actions: * If pain score is 4 or above: No action needed, pain <4.

## 2016-10-26 DIAGNOSIS — M25562 Pain in left knee: Secondary | ICD-10-CM | POA: Diagnosis not present

## 2016-10-26 DIAGNOSIS — M1712 Unilateral primary osteoarthritis, left knee: Secondary | ICD-10-CM | POA: Diagnosis not present

## 2016-10-26 DIAGNOSIS — G8929 Other chronic pain: Secondary | ICD-10-CM | POA: Diagnosis not present

## 2016-11-25 ENCOUNTER — Other Ambulatory Visit: Payer: Self-pay | Admitting: Internal Medicine

## 2016-11-25 DIAGNOSIS — Z1231 Encounter for screening mammogram for malignant neoplasm of breast: Secondary | ICD-10-CM

## 2016-11-28 ENCOUNTER — Encounter: Payer: Self-pay | Admitting: Internal Medicine

## 2016-12-10 ENCOUNTER — Other Ambulatory Visit: Payer: Self-pay | Admitting: Internal Medicine

## 2016-12-26 ENCOUNTER — Ambulatory Visit (INDEPENDENT_AMBULATORY_CARE_PROVIDER_SITE_OTHER): Payer: 59 | Admitting: Endocrinology

## 2016-12-26 ENCOUNTER — Ambulatory Visit
Admission: RE | Admit: 2016-12-26 | Discharge: 2016-12-26 | Disposition: A | Discharge status: Home / Self Care | Payer: 59 | Source: Ambulatory Visit | Attending: Internal Medicine | Admitting: Internal Medicine

## 2016-12-26 ENCOUNTER — Encounter: Payer: Self-pay | Admitting: Endocrinology

## 2016-12-26 VITALS — BP 128/78 | HR 92 | Ht 62.5 in | Wt 244.0 lb

## 2016-12-26 DIAGNOSIS — Z1231 Encounter for screening mammogram for malignant neoplasm of breast: Secondary | ICD-10-CM

## 2016-12-26 DIAGNOSIS — Z23 Encounter for immunization: Secondary | ICD-10-CM | POA: Diagnosis not present

## 2016-12-26 DIAGNOSIS — R310 Gross hematuria: Secondary | ICD-10-CM | POA: Diagnosis not present

## 2016-12-26 DIAGNOSIS — E119 Type 2 diabetes mellitus without complications: Secondary | ICD-10-CM

## 2016-12-26 LAB — POCT GLYCOSYLATED HEMOGLOBIN (HGB A1C): Hemoglobin A1C: 5.9

## 2016-12-26 NOTE — Progress Notes (Signed)
Subjective:    Patient ID: Jill [name], female    DOB: [birth year], 56 y.o.   MRN: [id number]  HPI Pt returns for f/u of diabetes mellitus: DM type: 2 Dx'ed: 0865 Complications: none Therapy: 4 oral meds GDM: never DKA: never Severe hypoglycemia: never Pancreatitis: never Other: she has never been on insulin; she declines bariatric surgery; edema limits oral DM rx options. Interval history: she brings a record of her cbg's which I have reviewed today.  It varies from 76-144.  There is no trend throughout the day. pt states she feels well in general.  She says her diet is much better Past Medical History:  Diagnosis Date  . Allergy   . Anemia   . Arthritis   . Asthma    30yrs ago  . Chronic deafness of both ears    left ear has 40% hearing-right ear has 60 % hearing  . Colon polyps   . Complication of anesthesia    Hard to wake up  . Diabetes mellitus without complication ([place])   . Dysmenorrhea   . Migraines    none in last 11 yrs  . Pelvic kidney    left  . Pneumonia   . Post partum depression   . Urinary incontinence     Past Surgical History:  Procedure Laterality Date  . CESAREAN SECTION    . COLONOSCOPY WITH PROPOFOL N/A 03/24/2015   Procedure: COLONOSCOPY WITH PROPOFOL;  Surgeon: Juanita Craver, MD;  Location: [facility];  Service: Endoscopy;  Laterality: N/A;  . KNEE ARTHROSCOPY WITH MEDIAL MENISECTOMY Left 04/17/2014   Procedure: LEFT KNEE ARTHROSCOPY WITH PARTIAL MEDIAL MENISCECTOMY AND CHONDROPLASTY;  Surgeon: Johnn Hai, MD;  Location: [facility];  Service: Orthopedics;  Laterality: Left;  . TYMPANOSTOMY TUBE PLACEMENT Right     Social History   Social History  . Marital status: Single    Spouse name: N/A  . Number of children: 1  . Years of education: N/A   Occupational History  . social services case worker   .  [place] History Main Topics  . Smoking status: Never Smoker  . Smokeless tobacco: Never Used  . Alcohol use  0.0 oz/week     Comment: rarely  . Drug use: No  . Sexual activity: No   Other Topics Concern  . Not on file   Social History Narrative   Single. Education: [employer].     Current Outpatient Prescriptions on File Prior to Visit  Medication Sig Dispense Refill  . empagliflozin (JARDIANCE) 25 MG TABS tablet Take 25 mg by mouth daily. 30 tablet 11  . glucose blood (ONETOUCH VERIO) test strip 1 each by Other route daily. And lancets 1/day 100 each 12  . glucose blood test strip Test blood sugar daily. E11.9 100 each 3  . linagliptin (TRADJENTA) 5 MG TABS tablet Take 1 tablet (5 mg total) by mouth daily. 30 tablet 11  . losartan (COZAAR) 25 MG tablet Take 1 tablet (25 mg total) by mouth daily. 30 tablet 13  . metFORMIN (GLUCOPHAGE-XR) 750 MG 24 hr tablet Take 1 tablet (750 mg total) by mouth daily with breakfast. 30 tablet 13  . ONE TOUCH LANCETS MISC TAKE 1 TABLET BY MOUTH EVERY DAY  12  . pantoprazole (PROTONIX) 40 MG tablet TAKE 1 TABLET (40 MG TOTAL) BY MOUTH DAILY. 30 tablet 5   Current Facility-Administered Medications on File Prior to Visit  Medication Dose Route Frequency Provider Last Rate Last Dose  .  0.9 %  sodium chloride infusion  500 mL Intravenous Continuous Nandigam, Venia Minks, MD        Allergies  Allergen Reactions  . Adhesive [Tape]     Pulls skin off  . Latex Itching  . Toradol [Ketorolac Tromethamine]     Joint pain and swelling  . Tramadol Other (See Comments)    JOINT PAIN    Family History  Problem Relation Age of Onset  . Hypertension Mother   . Arthritis Mother   . Hypertension Sister   . Deep vein thrombosis Sister   . Pulmonary embolism Sister   . Diabetes Maternal Grandmother   . Heart failure Maternal Grandmother   . Heart disease Maternal Grandmother   . Hyperlipidemia Maternal Grandmother   . Hypertension Maternal Grandmother   . Hypertension Maternal Grandfather   . Hyperlipidemia Maternal Grandfather   . Breast cancer Paternal Aunt   .  Breast cancer Paternal Grandmother     BP 128/78   Pulse 92   Ht 5' 2.5" (1.588 m)   Wt 244 lb (110.7 kg)   SpO2 97%   BMI 43.92 kg/m    Review of Systems She denies hypoglycemia.  She has lost 10 lbs.      Objective:   Physical Exam VITAL SIGNS:  See vs page GENERAL: no distress.  Morbid obesity.  Pulses: dorsalis pedis intact bilat.   MSK: no deformity of the feet CV: trace bilat leg edema Skin:  no ulcer on the feet.  normal color and temp on the feet.  Neuro: sensation is intact to touch on the feet.    A1c=5.9%    Assessment & Plan:  Type 2 DM: overcontrolled Weight loss.  This is helping glycemic control.  Patient Instructions  check your blood sugar a day.  vary the time of day when you check, between before the 3 meals, and at bedtime.  also check if you have symptoms of your blood sugar being too high or too low.  please keep a record of the readings and bring it to your next appointment here (or you can bring the meter itself).  You can write it on any piece of paper.  please call us sooner if your blood sugar goes below 70, or if you have a lot of readings over 200.  Please stop taking the repaglinide, and: Please continue the same other medications.   Please also continue your weight loss efforts, as they are working.  Please come back for a follow-up appointment in 5 months.

## 2016-12-26 NOTE — Patient Instructions (Addendum)
check your blood sugar a day.  vary the time of day when you check, between before the 3 meals, and at bedtime.  also check if you have symptoms of your blood sugar being too high or too low.  please keep a record of the readings and bring it to your next appointment here (or you can bring the meter itself).  You can write it on any piece of paper.  please call us sooner if your blood sugar goes below 70, or if you have a lot of readings over 200.  Please stop taking the repaglinide, and: Please continue the same other medications.   Please also continue your weight loss efforts, as they are working.  Please come back for a follow-up appointment in 5 months.

## 2016-12-28 DIAGNOSIS — R300 Dysuria: Secondary | ICD-10-CM | POA: Diagnosis not present

## 2017-02-06 DIAGNOSIS — R8271 Bacteriuria: Secondary | ICD-10-CM | POA: Diagnosis not present

## 2017-03-03 ENCOUNTER — Ambulatory Visit: Payer: 59 | Admitting: Internal Medicine

## 2017-04-01 ENCOUNTER — Other Ambulatory Visit: Payer: Self-pay | Admitting: Obstetrics & Gynecology

## 2017-04-03 ENCOUNTER — Other Ambulatory Visit: Payer: Self-pay

## 2017-04-03 ENCOUNTER — Telehealth: Payer: Self-pay | Admitting: Endocrinology

## 2017-04-03 MED ORDER — METFORMIN HCL ER 750 MG PO TB24: 750.0000 mg | ORAL_TABLET | Freq: Every day | ORAL | 13 refills | Status: DC

## 2017-04-03 NOTE — Telephone Encounter (Signed)
eScribe request from [facility] for refill on METFORMIN Last filled - 06/03/16, #30 X 13RF by Dr. Haywood Lasso denied as patient has current RX at pharmacy from endocrinologist.

## 2017-04-03 NOTE — Telephone Encounter (Signed)
Left patient VM that prescription was sent in.

## 2017-04-03 NOTE — Telephone Encounter (Signed)
MEDICATION: metformin 750 1 time daily  PHARMACY: [facility]    IS THIS A 90 DAY SUPPLY : no  IS PATIENT OUT OF MEDICATION: yes  IF NOT; HOW MUCH IS LEFT: none  LAST APPOINTMENT DATE:12/26/16  NEXT APPOINTMENT DATE:05/29/17  OTHER COMMENTS:    **Let patient know to contact pharmacy at the end of the day to make sure medication is ready. **  ** Please notify patient to allow 48-72 hours to process**  **Encourage patient to contact the pharmacy for refills or they can request refills through [facility]**

## 2017-04-15 ENCOUNTER — Other Ambulatory Visit: Payer: Self-pay | Admitting: Obstetrics & Gynecology

## 2017-04-17 NOTE — Telephone Encounter (Signed)
Medication refill request: [place]ar Last AEX:  03-18-16  Next AEX: 06-15-17  Last MMG (if hormonal medication request): 12-27-16 WNL Refill authorized: please advise

## 2017-04-18 NOTE — Telephone Encounter (Signed)
Please call pharmacy and have them send this request to Dr. Quay Burow, her PCP.  Thanks.

## 2017-04-19 NOTE — Telephone Encounter (Signed)
Called pharmacy and requested RX refill request to go to patients PCP, Dr Quay Burow.

## 2017-05-28 NOTE — Progress Notes (Signed)
Subjective:    Patient ID: Jill [name], female    DOB: 1960-08-26, 56 y.o.   MRN: [id number]  HPI The patient is here for follow up.  She continues to eat well and is walking 5 days a week.    Diabetes: She is following with Dr Jill [name].  She is taking her medication daily as prescribed. She is compliant with a diabetic diet. She is exercising regularly - 5 days a week.  She is not up-to-date with an ophthalmology examination, but will schedule   GERD:  She is not taking her medication daily - she had an EGD and it was normal.  She has been eating more healthy and denies GERD.     chronic constipation, Hemorrhoids:  She has tried colace and it no longer works.She has also tried several different foods and juices and nothing has helped.  Linzess works, but it is too expensive.   She last had a prescription sent to her pharmacy about one year ago. She also has hemorrhoids, likely the results of the constipation. She is interested in having a procedure done to take care of these.  OSA:  She never returned for a CPAP titration years ago. Over the past year she has successfully lost weight and would like to continue her weight loss efforts. According to her apple watch she does not sleep well and she does feel tired at times. Since losing weight she feels that she wakes herself up less frequently at night with her snoring.  Medications and allergies reviewed with patient and updated if appropriate.  Patient Active Problem List   Diagnosis Date Noted  . Hematuria 09/07/2016  . Abdominal pain, chronic, epigastric 08/10/2016  . IUD (intrauterine device) in place 07/05/2016  . Menorrhagia with irregular cycle 05/03/2016  . Fibroids, intramural 05/03/2016  . Pelvic kidney   . Diabetes mellitus without complication ([place])   . OBESITY, MORBID 07/30/2009  . DYSPNEA ON EXERTION 07/30/2009  . OBSTRUCTIVE SLEEP APNEA 05/19/2009  . FATIGUE 05/19/2009  . MOTION SICKNESS 03/12/2007  . ASTHMA  01/31/2007  . GERD 01/31/2007  . MIGRAINE, MENSTRUAL 01/31/2007    Current Outpatient Prescriptions on File Prior to Visit  Medication Sig Dispense Refill  . empagliflozin (JARDIANCE) 25 MG TABS tablet Take 25 mg by mouth daily. 30 tablet 11  . glucose blood (ONETOUCH VERIO) test strip 1 each by Other route daily. And lancets 1/day 100 each 12  . glucose blood test strip Test blood sugar daily. E11.9 100 each 3  . linagliptin (TRADJENTA) 5 MG TABS tablet Take 1 tablet (5 mg total) by mouth daily. 30 tablet 11  . losartan (COZAAR) 25 MG tablet TAKE 1 TABLET BY MOUTH EVERY DAY 30 tablet 11  . metFORMIN (GLUCOPHAGE-XR) 750 MG 24 hr tablet Take 1 tablet (750 mg total) by mouth daily with breakfast. 30 tablet 13  . ONE TOUCH LANCETS MISC TAKE 1 TABLET BY MOUTH EVERY DAY  12   No current facility-administered medications on file prior to visit.     Past Medical History:  Diagnosis Date  . Allergy   . Anemia   . Arthritis   . Asthma    64yrs ago  . Chronic deafness of both ears    left ear has 40% hearing-right ear has 60 % hearing  . Colon polyps   . Complication of anesthesia    Hard to wake up  . Diabetes mellitus without complication ([place])   . Dysmenorrhea   .  Migraines    none in last 11 yrs  . Pelvic kidney    left  . Pneumonia   . Post partum depression   . Urinary incontinence     Past Surgical History:  Procedure Laterality Date  . CESAREAN SECTION    . COLONOSCOPY WITH PROPOFOL N/A 03/24/2015   Procedure: COLONOSCOPY WITH PROPOFOL;  Surgeon: Juanita Craver, MD;  [facility];  Service: Endoscopy;  Laterality: N/A;  . KNEE ARTHROSCOPY WITH MEDIAL MENISECTOMY Left 04/17/2014   Procedure: LEFT KNEE ARTHROSCOPY WITH PARTIAL MEDIAL MENISCECTOMY AND CHONDROPLASTY;  Surgeon: Johnn Hai, MD;  Location: [facility];  Service: Orthopedics;  Laterality: Left;  . TYMPANOSTOMY TUBE PLACEMENT Right     Social History   Social History  . Marital status: Single     Spouse name: N/A  . Number of children: 1  . Years of education: N/A   Occupational History  . social services case worker   .  [place] History Main Topics  . Smoking status: Never Smoker  . Smokeless tobacco: Never Used  . Alcohol use 0.0 oz/week     Comment: rarely  . Drug use: No  . Sexual activity: No   Other Topics Concern  . Not on file   Social History Narrative   Single. Education: [employer].     Family History  Problem Relation Age of Onset  . Hypertension Mother   . Arthritis Mother   . Hypertension Sister   . Deep vein thrombosis Sister   . Pulmonary embolism Sister   . Diabetes Maternal Grandmother   . Heart failure Maternal Grandmother   . Heart disease Maternal Grandmother   . Hyperlipidemia Maternal Grandmother   . Hypertension Maternal Grandmother   . Hypertension Maternal Grandfather   . Hyperlipidemia Maternal Grandfather   . Breast cancer Paternal Aunt   . Breast cancer Paternal Grandmother     Review of Systems  Constitutional: Negative for chills and fever.  Respiratory: Negative for cough, shortness of breath and wheezing.   Cardiovascular: Negative for chest pain, palpitations and leg swelling.  Neurological: Positive for headaches (occ). Negative for light-headedness.       Objective:   Vitals:   05/29/17 1137  BP: 124/72  Pulse: 79  Resp: 16  Temp: 98.3 F (36.8 C)  SpO2: 98%   Wt Readings from Last 3 Encounters:  05/29/17 233 lb (105.7 kg)  12/26/16 244 lb (110.7 kg)  10/18/16 255 lb (115.7 kg)   Body mass index is 41.94 kg/m.   Physical Exam    Constitutional: Appears well-developed and well-nourished. No distress.  HENT:  Head: Normocephalic and atraumatic.  Neck: Neck supple. No tracheal deviation present. No thyromegaly present.  No cervical lymphadenopathy Cardiovascular: Normal rate, regular rhythm and normal heart sounds.   No murmur heard. No carotid bruit .  No edema Pulmonary/Chest: Effort  normal and breath sounds normal. No respiratory distress. No has no wheezes. No rales.  Skin: Skin is warm and dry. Not diaphoretic.  Psychiatric: Normal mood and affect. Behavior is normal.      Assessment & Plan:    See Problem List for Assessment and Plan of chronic medical problems.

## 2017-05-28 NOTE — Patient Instructions (Addendum)
  Test(s) ordered today. Your results will be released to [place] (or called to you) after review, usually within 72hours after test completion. If any changes need to be made, you will be notified at that same time.  All other Health Maintenance issues reviewed.   All recommended immunizations and age-appropriate screenings are up-to-date or discussed.  Flu immunization administered today.   Medications reviewed and updated.  Changes include restarting Linzess.   Your prescription(s) have been submitted to your pharmacy. Please take as directed and contact our office if you believe you are having problem(s) with the medication(s).  A referral was ordered for  GI  Please followup in one year

## 2017-05-29 ENCOUNTER — Encounter: Payer: Self-pay | Admitting: Endocrinology

## 2017-05-29 ENCOUNTER — Encounter: Payer: Self-pay | Admitting: Internal Medicine

## 2017-05-29 ENCOUNTER — Ambulatory Visit (INDEPENDENT_AMBULATORY_CARE_PROVIDER_SITE_OTHER): Payer: 59 | Admitting: Endocrinology

## 2017-05-29 ENCOUNTER — Other Ambulatory Visit (INDEPENDENT_AMBULATORY_CARE_PROVIDER_SITE_OTHER): Payer: 59

## 2017-05-29 ENCOUNTER — Ambulatory Visit (INDEPENDENT_AMBULATORY_CARE_PROVIDER_SITE_OTHER): Payer: 59 | Admitting: Internal Medicine

## 2017-05-29 VITALS — BP 124/72 | HR 79 | Temp 98.3°F | Resp 16 | Wt 233.0 lb

## 2017-05-29 DIAGNOSIS — Z23 Encounter for immunization: Secondary | ICD-10-CM | POA: Diagnosis not present

## 2017-05-29 DIAGNOSIS — K5900 Constipation, unspecified: Secondary | ICD-10-CM | POA: Diagnosis not present

## 2017-05-29 DIAGNOSIS — K2100 Gastro-esophageal reflux disease with esophagitis, without bleeding: Secondary | ICD-10-CM

## 2017-05-29 DIAGNOSIS — G4733 Obstructive sleep apnea (adult) (pediatric): Secondary | ICD-10-CM | POA: Diagnosis not present

## 2017-05-29 DIAGNOSIS — K649 Unspecified hemorrhoids: Secondary | ICD-10-CM | POA: Insufficient documentation

## 2017-05-29 DIAGNOSIS — E119 Type 2 diabetes mellitus without complications: Secondary | ICD-10-CM

## 2017-05-29 LAB — CBC WITH DIFFERENTIAL/PLATELET
BASOS PCT: 0.7 % (ref 0.0–3.0)
Basophils Absolute: 0 10*3/uL (ref 0.0–0.1)
EOS PCT: 0.9 % (ref 0.0–5.0)
Eosinophils Absolute: 0.1 10*3/uL (ref 0.0–0.7)
HEMATOCRIT: 43.8 % (ref 36.0–46.0)
HEMOGLOBIN: 14.1 g/dL (ref 12.0–15.0)
LYMPHS PCT: 30.4 % (ref 12.0–46.0)
Lymphs Abs: 2 10*3/uL (ref 0.7–4.0)
MCHC: 32.3 g/dL (ref 30.0–36.0)
MCV: 81.7 fl (ref 78.0–100.0)
MONOS PCT: 6.9 % (ref 3.0–12.0)
Monocytes Absolute: 0.5 10*3/uL (ref 0.1–1.0)
Neutro Abs: 4.1 10*3/uL (ref 1.4–7.7)
Neutrophils Relative %: 61.1 % (ref 43.0–77.0)
Platelets: 220 10*3/uL (ref 150.0–400.0)
RBC: 5.36 Mil/uL — AB (ref 3.87–5.11)
RDW: 16.5 % — ABNORMAL HIGH (ref 11.5–15.5)
WBC: 6.7 10*3/uL (ref 4.0–10.5)

## 2017-05-29 LAB — COMPREHENSIVE METABOLIC PANEL
ALBUMIN: 4.3 g/dL (ref 3.5–5.2)
ALK PHOS: 77 U/L (ref 39–117)
ALT: 12 U/L (ref 0–35)
AST: 11 U/L (ref 0–37)
BUN: 18 mg/dL (ref 6–23)
CALCIUM: 11 mg/dL — AB (ref 8.4–10.5)
CO2: 27 mEq/L (ref 19–32)
Chloride: 108 mEq/L (ref 96–112)
Creatinine, Ser: 1.02 mg/dL (ref 0.40–1.20)
GFR: 72.32 mL/min (ref >60.00)
Glucose, Bld: 110 mg/dL — ABNORMAL HIGH (ref 70–99)
POTASSIUM: 5 meq/L (ref 3.5–5.1)
SODIUM: 142 meq/L (ref 135–145)
TOTAL PROTEIN: 7.9 g/dL (ref 6.0–8.3)
Total Bilirubin: 0.5 mg/dL (ref 0.2–1.2)

## 2017-05-29 LAB — LIPID PANEL
CHOL/HDL RATIO: 4
Cholesterol: 170 mg/dL (ref 0–200)
HDL: 40.4 mg/dL (ref >39.00)
LDL Cholesterol: 121 mg/dL — ABNORMAL HIGH (ref 0–99)
NONHDL: 129.92
TRIGLYCERIDES: 44 mg/dL (ref 0.0–149.0)
VLDL: 8.8 mg/dL (ref 0.0–40.0)

## 2017-05-29 LAB — TSH: TSH: 1.01 u[IU]/mL (ref 0.35–4.50)

## 2017-05-29 LAB — POCT GLYCOSYLATED HEMOGLOBIN (HGB A1C): Hemoglobin A1C: 5.6

## 2017-05-29 LAB — VITAMIN D 25 HYDROXY (VIT D DEFICIENCY, FRACTURES): VITD: 30.47 ng/mL (ref 30.00–100.00)

## 2017-05-29 MED ORDER — METFORMIN HCL ER 750 MG PO TB24
750.0000 mg | ORAL_TABLET | Freq: Every day | ORAL | 13 refills | Status: DC
Start: 2017-05-29 — End: 2018-11-09

## 2017-05-29 MED ORDER — LINACLOTIDE 145 MCG PO CAPS: 145.0000 ug | ORAL_CAPSULE | Freq: Every day | ORAL | 11 refills | Status: DC

## 2017-05-29 MED ORDER — EMPAGLIFLOZIN 25 MG PO TABS
25.0000 mg | ORAL_TABLET | Freq: Every day | ORAL | 11 refills | Status: DC
Start: 2017-05-29 — End: 2018-11-09

## 2017-05-29 NOTE — Assessment & Plan Note (Signed)
EKG normal Discontinue Protonix Compliant with her diet and has lost weight No symptoms of GERD Monitor off medication

## 2017-05-29 NOTE — Assessment & Plan Note (Signed)
Not on CPAP Working on weight loss

## 2017-05-29 NOTE — Assessment & Plan Note (Signed)
Interested in treatment Refer to GI for further evaluation

## 2017-05-29 NOTE — Progress Notes (Signed)
Subjective:    Patient ID: Jill [name], female    DOB: 03/15/1961, 56 y.o.   MRN: [id number]  HPI Pt returns for f/u of diabetes mellitus: DM type: 2 Dx'ed: 1308 Complications: none Therapy: 3 oral meds GDM: never DKA: never Severe hypoglycemia: never Pancreatitis: never Other: she has never been on insulin; she declines bariatric surgery; edema limits oral DM rx options. Interval history: no cbg record, but states cbg's are well-controlled.  There is no trend throughout the day. pt states she feels well in general.   Pt also has hypercalcemia.  she has never had osteoporosis, urolithiasis, thyroid probs, parathyroid probs, sarcoidosis, cancer, pancreatitis, or bony fracture.  she does not take vitamin-A supplements.   Past Medical History:  Diagnosis Date  . Allergy   . Anemia   . Arthritis   . Asthma    64yrs ago  . Chronic deafness of both ears    left ear has 40% hearing-right ear has 60 % hearing  . Colon polyps   . Complication of anesthesia    Hard to wake up  . Diabetes mellitus without complication ([place])   . Dysmenorrhea   . Migraines    none in last 11 yrs  . Pelvic kidney    left  . Pneumonia   . Post partum depression   . Urinary incontinence     Past Surgical History:  Procedure Laterality Date  . CESAREAN SECTION    . COLONOSCOPY WITH PROPOFOL N/A 03/24/2015   Procedure: COLONOSCOPY WITH PROPOFOL;  Surgeon: Juanita Craver, MD;  Location: [facility];  Service: Endoscopy;  Laterality: N/A;  . KNEE ARTHROSCOPY WITH MEDIAL MENISECTOMY Left 04/17/2014   Procedure: LEFT KNEE ARTHROSCOPY WITH PARTIAL MEDIAL MENISCECTOMY AND CHONDROPLASTY;  Surgeon: Johnn Hai, MD;  Location: [facility];  Service: Orthopedics;  Laterality: Left;  . TYMPANOSTOMY TUBE PLACEMENT Right     Social History   Social History  . Marital status: Single    Spouse name: N/A  . Number of children: 1  . Years of education: N/A   Occupational History  . social services case  worker   .  [employer] History Main Topics  . Smoking status: Never Smoker  . Smokeless tobacco: Never Used  . Alcohol use 0.0 oz/week     Comment: rarely  . Drug use: No  . Sexual activity: No   Other Topics Concern  . Not on file   Social History Narrative   Single. Education: [employer].     Current Outpatient Prescriptions on File Prior to Visit  Medication Sig Dispense Refill  . glucose blood (ONETOUCH VERIO) test strip 1 each by Other route daily. And lancets 1/day 100 each 12  . glucose blood test strip Test blood sugar daily. E11.9 100 each 3  . losartan (COZAAR) 25 MG tablet TAKE 1 TABLET BY MOUTH EVERY DAY 30 tablet 11  . ONE TOUCH LANCETS MISC TAKE 1 TABLET BY MOUTH EVERY DAY  12   No current facility-administered medications on file prior to visit.     Allergies  Allergen Reactions  . Adhesive [Tape]     Pulls skin off  . Latex Itching  . Toradol [Ketorolac Tromethamine]     Joint pain and swelling  . Tramadol Other (See Comments)    JOINT PAIN    Family History  Problem Relation Age of Onset  . Hypertension Mother   . Arthritis Mother   . Hypercalcemia Mother   .  Hypertension Sister   . Deep vein thrombosis Sister   . Pulmonary embolism Sister   . Diabetes Maternal Grandmother   . Heart failure Maternal Grandmother   . Heart disease Maternal Grandmother   . Hyperlipidemia Maternal Grandmother   . Hypertension Maternal Grandmother   . Hypertension Maternal Grandfather   . Hyperlipidemia Maternal Grandfather   . Breast cancer Paternal Aunt   . Breast cancer Paternal Grandmother     BP 114/78 (BP Location: Left Arm, Patient Position: Sitting, Cuff Size: Large)   Pulse 85   Temp 98.6 F (37 C) (Oral)   Wt 236 lb (107 kg)   SpO2 98%   BMI 42.48 kg/m    Review of Systems She has lost 8 more lbs.      Objective:   Physical Exam VITAL SIGNS:  See vs page GENERAL: no distress Pulses: foot pulses are intact bilaterally.   MSK:  no deformity of the feet or ankles.  CV: trace bilat edema of the legs Skin:  no ulcer on the feet or ankles.  normal color and temp on the feet and ankles Neuro: sensation is intact to touch on the feet and ankles.     A1c=5.6%    Assessment & Plan:  Type 2 DM: well-controlled Hypercalcemia: new to me.  Strong pos FHx raises question of [place].  MEN is less likely (but we'll check prolactin).  Coincidence is possible also.    Patient Instructions  check your blood sugar twice a week.  vary the time of day when you check, between before the 3 meals, and at bedtime.  also check if you have symptoms of your blood sugar being too high or too low.  please keep a record of the readings and bring it to your next appointment here (or you can bring the meter itself).  You can write it on any piece of paper.  please call us sooner if your blood sugar goes below 70, or if you have a lot of readings over 200.  Please stop taking the tradjenta, and:  Please continue the same other diabetes medications.   blood tests are requested for you today.  We'll let you know about the results.  If the parathyroid level is normal or low, the next step will be to do a 24-HR urine test, to see if you inherited this.   Please come back for a follow-up appointment in 6 months.

## 2017-05-29 NOTE — Assessment & Plan Note (Signed)
Chronic Stool softeners, dietary changes have not been successful Linzess was helpful, but was too expensive. Prescription left over a year ago and may be less expensive now-we will try sending a new prescription Referred to GI for chronic constipation and hemorrhoids.

## 2017-05-29 NOTE — Patient Instructions (Addendum)
check your blood sugar twice a week.  vary the time of day when you check, between before the 3 meals, and at bedtime.  also check if you have symptoms of your blood sugar being too high or too low.  please keep a record of the readings and bring it to your next appointment here (or you can bring the meter itself).  You can write it on any piece of paper.  please call us sooner if your blood sugar goes below 70, or if you have a lot of readings over 200.  Please stop taking the tradjenta, and:  Please continue the same other diabetes medications.   blood tests are requested for you today.  We'll let you know about the results.  If the parathyroid level is normal or low, the next step will be to do a 24-HR urine test, to see if you inherited this.   Please come back for a follow-up appointment in 6 months.

## 2017-05-29 NOTE — Assessment & Plan Note (Signed)
This change her diet significantly and is exercising regularly and she has lost weight Sugars very well controlled Following with Dr. Loanne Drilling and has appointment today Management per Dr. Loanne Drilling

## 2017-05-30 LAB — PROLACTIN: PROLACTIN: 4.6 ng/mL

## 2017-06-01 ENCOUNTER — Encounter: Payer: Self-pay | Admitting: Internal Medicine

## 2017-06-01 LAB — PTH, INTACT AND CALCIUM
CALCIUM: 10.4 mg/dL (ref 8.6–10.4)
PTH: 77 pg/mL — ABNORMAL HIGH (ref 14–64)

## 2017-06-14 ENCOUNTER — Other Ambulatory Visit: Payer: Self-pay | Admitting: Endocrinology

## 2017-06-15 ENCOUNTER — Ambulatory Visit: Payer: 59 | Admitting: Obstetrics & Gynecology

## 2017-08-18 ENCOUNTER — Ambulatory Visit: Payer: 59 | Admitting: Gastroenterology

## 2017-08-18 VITALS — BP 124/80 | HR 87 | Ht 62.0 in | Wt 241.8 lb

## 2017-08-18 DIAGNOSIS — K219 Gastro-esophageal reflux disease without esophagitis: Secondary | ICD-10-CM | POA: Diagnosis not present

## 2017-08-18 DIAGNOSIS — K641 Second degree hemorrhoids: Secondary | ICD-10-CM | POA: Diagnosis not present

## 2017-08-18 NOTE — Progress Notes (Signed)
Jill [name]    [id number]    08-19-1960  Primary Care Physician:Burns, Claudina Lick, MD  Referring Physician: Binnie Rail, MD [place], [place] [address]  Chief complaint:  Hemorrhoids  HPI:  78 yr F here with complaints of symptomatic hemorrhoids. She is having small volume intermittent blood per rectum associated with itching and discomfort. It was worse when she was straining excessively with constipation.  She is no longer constipated, has changed her diet and medications, is currently having soft bowel movement Reflux symptoms also improved with no significant breakthrough heartburn or regurgitation.  Denies any nausea, vomiting, dysphagia or abdominal pain.    Outpatient Encounter Medications as of 08/18/2017  Medication Sig  . empagliflozin (JARDIANCE) 25 MG TABS tablet Take 25 mg by mouth daily.  Marland Kitchen glucose blood (ONETOUCH VERIO) test strip 1 each by Other route daily. And lancets 1/day  . glucose blood test strip Test blood sugar daily. E11.9  . losartan (COZAAR) 25 MG tablet TAKE 1 TABLET BY MOUTH EVERY DAY  . metFORMIN (GLUCOPHAGE-XR) 750 MG 24 hr tablet Take 1 tablet (750 mg total) by mouth daily with breakfast.  . ONE TOUCH LANCETS MISC TAKE 1 TABLET BY MOUTH EVERY DAY  . [DISCONTINUED] linaclotide (LINZESS) 145 MCG CAPS capsule Take 1 capsule (145 mcg total) by mouth daily before breakfast. (Patient not taking: Reported on 08/18/2017)   No facility-administered encounter medications on file as of 08/18/2017.     Allergies as of 08/18/2017 - Review Complete 08/18/2017  Allergen Reaction Noted  . Adhesive [tape]  04/10/2014  . Latex Itching 04/10/2014  . Toradol [ketorolac tromethamine]  01/01/2015  . Tramadol Other (See Comments) 04/17/2014    Past Medical History:  Diagnosis Date  . Allergy   . Anemia   . Arthritis   . Asthma    71yrs ago  . Chronic deafness of both ears    left ear has 40% hearing-right ear has 60 % hearing  .  Colon polyps   . Complication of anesthesia    Hard to wake up  . Diabetes mellitus without complication ([place])   . Dysmenorrhea   . Migraines    none in last 11 yrs  . Pelvic kidney    left  . Pneumonia   . Post partum depression   . Urinary incontinence     Past Surgical History:  Procedure Laterality Date  . CESAREAN SECTION    . COLONOSCOPY WITH PROPOFOL N/A 03/24/2015   Procedure: COLONOSCOPY WITH PROPOFOL;  Surgeon: Juanita Craver, MD;  Location: [facility];  Service: Endoscopy;  Laterality: N/A;  . KNEE ARTHROSCOPY WITH MEDIAL MENISECTOMY Left 04/17/2014   Procedure: LEFT KNEE ARTHROSCOPY WITH PARTIAL MEDIAL MENISCECTOMY AND CHONDROPLASTY;  Surgeon: Johnn Hai, MD;  Location: [facility];  Service: Orthopedics;  Laterality: Left;  . TYMPANOSTOMY TUBE PLACEMENT Right     Family History  Problem Relation Age of Onset  . Hypertension Mother   . Arthritis Mother   . Hypercalcemia Mother   . Hypertension Sister   . Deep vein thrombosis Sister   . Pulmonary embolism Sister   . Diabetes Maternal Grandmother   . Heart failure Maternal Grandmother   . Heart disease Maternal Grandmother   . Hyperlipidemia Maternal Grandmother   . Hypertension Maternal Grandmother   . Hypertension Maternal Grandfather   . Hyperlipidemia Maternal Grandfather   . Breast cancer Paternal Aunt   . Breast cancer Paternal  Grandmother     Social History   Socioeconomic History  . Marital status: Single    Spouse name: Not on file  . Number of children: 1  . Years of education: Not on file  . Highest education level: Not on file  Social Needs  . Financial resource strain: Not on file  . Food insecurity - worry: Not on file  . Food insecurity - inability: Not on file  . Transportation needs - medical: Not on file  . Transportation needs - non-medical: Not on file  Occupational History  . Occupation: social services case worker    Employer: [employer]  Tobacco Use  . Smoking status:  Never Smoker  . Smokeless tobacco: Never Used  Substance and Sexual Activity  . Alcohol use: Yes    Alcohol/week: 0.0 oz    Comment: rarely  . Drug use: No  . Sexual activity: No    Partners: Male    Birth control/protection: Abstinence  Other Topics Concern  . Not on file  Social History Narrative   Single. Education: [employer].       Review of systems: Review of Systems  Constitutional: Negative for fever and chills.  HENT: Negative.   Eyes: Negative for blurred vision.  Respiratory: Negative for cough, shortness of breath and wheezing.   Cardiovascular: Negative for chest pain and palpitations.  Gastrointestinal: as per HPI Genitourinary: Negative for dysuria, urgency, frequency and hematuria.  Musculoskeletal: Negative for myalgias, back pain and joint pain.  Skin: Negative for itching and rash.  Neurological: Negative for dizziness, tremors, focal weakness, seizures and loss of consciousness.  Endo/Heme/Allergies: Positive for seasonal allergies.  Psychiatric/Behavioral: Negative for depression, suicidal ideas and hallucinations.  All other systems reviewed and are negative.   Physical Exam: Vitals:   08/18/17 1336  BP: 124/80  Pulse: 87  SpO2: 98%   Body mass index is 44.23 kg/m. Gen:      No acute distress HEENT:  EOMI, sclera anicteric Neck:     No masses; no thyromegaly Lungs:    Clear to auscultation bilaterally; normal respiratory effort CV:         Regular rate and rhythm; no murmurs Abd:      + bowel sounds; soft, non-tender; no palpable masses, no distension Ext:    No edema; adequate peripheral perfusion Skin:      Warm and dry; no rash Neuro: alert and oriented x 3 Psych: normal mood and affect  Data Reviewed:  Reviewed labs, radiology imaging, old records and pertinent past GI work up  EGD 10/18/16 LA Grade A (one or more mucosal breaks less than 5 mm, not extending between tops of 2 mucosal folds) esophagitis with no bleeding was found 34 to  35 cm from the incisors. Regular Z-line 34cm Patchy mildly erythematous mucosa without bleeding was found in the gastric antrum. Biopsies were taken with a cold forceps for Helicobacter pylori testing using CLOtest. The examined duodenum was normal  Colonoscopy 03/24/15 A single dimunitive polyp was found in the proximal ascending colon and was removed by cold biopsies x 2 using cold forceps. Sample was obtained and sent to histology. The rest of the entire colonic mucosa appeared healthy with a normal vascular pattern. No masses, diverticula or AVMs were noted. The appendiceal orifice and the ICV were identified and photographed. . Retroflexed views revealed small internal hemorrhoids. The patient tolerated the procedure without immediate complications. The scope was then withdrawn from the patient and the procedure terminated.  Assessment and  Plan/Recommendations:  The patient presents with symptomatic grade II  hemorrhoids, requesting rubber band ligation of his/her hemorrhoidal disease.  All risks, benefits and alternative forms of therapy were described and informed consent was obtained.  In the Left Lateral Decubitus position anoscopic examination revealed grade II hemorrhoids in the Right posterior, right anterior and left lateral position(s).  The anorectum was pre-medicated with 0.125% Nitroglycerine and Recticare The decision was made to band the Right posterior internal hemorrhoid, and the [place] was used to perform band ligation without complication.  Digital anorectal examination was then performed to assure proper positioning of the band, and to adjust the banded tissue as required.  The patient was discharged home without pain or other issues.  Dietary and behavioral recommendations were given and along with follow-up instructions.     The following adjunctive treatments were recommended: Benefiber 1 tablespoon TID with meals  GERD: stable Continue antireflux  measures and lifestyle modifications  The patient will return in 2-4 weeks for  follow-up and possible additional banding as required. No complications were encountered and the patient tolerated the procedure well.     Damaris Hippo , MD [phone] Mon-Fri 8a-5p [phone] after 5p, weekends, holidays  CC: Burns, Claudina Lick, MD

## 2017-08-18 NOTE — Patient Instructions (Signed)

## 2017-08-21 ENCOUNTER — Ambulatory Visit: Payer: 59 | Admitting: Nurse Practitioner

## 2017-08-21 ENCOUNTER — Encounter: Payer: Self-pay | Admitting: Gastroenterology

## 2017-08-21 ENCOUNTER — Telehealth: Payer: Self-pay | Admitting: Gastroenterology

## 2017-08-21 NOTE — Telephone Encounter (Signed)
Called back patient. She is feeling better after she had a bowel movement, no longer has any pain or discomfort. She noticed small amount of pink tinged fluid when she wiped after straining on the toilet.  Advised her to avoid excessive straining.  Continue benefiber and increase fluid intake. Ok to use Miralax 1 capfule daily as needed. Advised her to call with any change in symptoms. She doesn't need office visit .Thanks

## 2017-08-21 NOTE — Telephone Encounter (Signed)
Patient began feeling uncomfortable on Saturday. Could not sit comfortably. Sunday became more uncomfortable and unable to tolerate sitting for more than 30 minutes. She says about the same now. She is having painful hard dry bowel movements. Scant blood with bowel movement. I have held an appointment on [place] schedule today. Please advise.

## 2017-08-23 ENCOUNTER — Other Ambulatory Visit: Payer: Self-pay

## 2017-08-23 NOTE — Telephone Encounter (Signed)
Spoke with the patient. She is "better" but she is still uncomfortable sitting for extended periods. No worsening symptoms. She may try a "dounut" in her chair to see if that is more comfortable.

## 2017-08-23 NOTE — Telephone Encounter (Signed)
Patient states she did not see a work note in [place] and is asking for one to be put in for this past Monday and Yesterday. Patient also states she is at work today and it hurts to sit on her butt and wants to speak with [facility] about this.

## 2017-09-12 ENCOUNTER — Encounter: Payer: Self-pay | Admitting: Gastroenterology

## 2017-09-12 ENCOUNTER — Ambulatory Visit: Payer: 59 | Admitting: Gastroenterology

## 2017-09-12 VITALS — BP 114/68 | HR 74 | Ht 62.0 in | Wt 246.4 lb

## 2017-09-12 DIAGNOSIS — K641 Second degree hemorrhoids: Secondary | ICD-10-CM

## 2017-09-12 NOTE — Progress Notes (Signed)
PROCEDURE NOTE: The patient presents with symptomatic grade II  hemorrhoids, requesting rubber band ligation of his/her hemorrhoidal disease.  All risks, benefits and alternative forms of therapy were described and informed consent was obtained.   The anorectum was pre-medicated with 0.125% nitroglycerine and recticare The decision was made to band the  Right anterior internal hemorrhoid, and the [place] was used to perform band ligation without complication.  Digital anorectal examination was then performed to assure proper positioning of the band, and to adjust the banded tissue as required.  The patient was discharged home without pain or other issues.  Dietary and behavioral recommendations were given and along with follow-up instructions.     The patient will return for  follow-up and possible additional banding as required. No complications were encountered and the patient tolerated the procedure well.  Damaris Hippo , MD [phone] Mon-Fri 8a-5p [phone] after 5p, weekends, holidays

## 2017-09-12 NOTE — Patient Instructions (Addendum)
If you are age 56 or older, your body mass index should be between 23-30. Your Body mass index is 45.06 kg/m. If this is out of the aforementioned range listed, please consider follow up with your Primary Care Provider.  If you are age 59 or younger, your body mass index should be between 19-25. Your Body mass index is 45.06 kg/m. If this is out of the aformentioned range listed, please consider follow up with your Primary Care Provider.   HEMORRHOID BANDING PROCEDURE    FOLLOW-UP CARE   1. The procedure you have had should have been relatively painless since the banding of the area involved does not have nerve endings and there is no pain sensation.  The rubber band cuts off the blood supply to the hemorrhoid and the band may fall off as soon as 48 hours after the banding (the band may occasionally be seen in the toilet bowl following a bowel movement). You may notice a temporary feeling of fullness in the rectum which should respond adequately to plain Tylenol or Motrin.  2. Following the banding, avoid strenuous exercise that evening and resume full activity the next day.  A sitz bath (soaking in a warm tub) or bidet is soothing, and can be useful for cleansing the area after bowel movements.     3. To avoid constipation, take two tablespoons of natural wheat bran, natural oat bran, flax, Benefiber or any over the counter fiber supplement and increase your water intake to 7-8 glasses daily.    4. Unless you have been prescribed anorectal medication, do not put anything inside your rectum for two weeks: No suppositories, enemas, fingers, etc.  5. Occasionally, you may have more bleeding than usual after the banding procedure.  This is often from the untreated hemorrhoids rather than the treated one.  Don't be concerned if there is a tablespoon or so of blood.  If there is more blood than this, lie flat with your bottom higher than your head and apply an ice pack to the area. If the bleeding  does not stop within a half an hour or if you feel faint, call our office at [phone] or go to the emergency room.  6. Problems are not common; however, if there is a substantial amount of bleeding, severe pain, chills, fever or difficulty passing urine (very rare) or other problems, you should call us at [phone] or report to the nearest emergency room.  7. Do not stay seated continuously for more than 2-3 hours for a day or two after the procedure.  Tighten your buttock muscles 10-15 times every two hours and take 10-15 deep breaths every 1-2 hours.  Do not spend more than a few minutes on the toilet if you cannot empty your bowel; instead re-visit the toilet at a later time.   Call our office in 2 weeks with an update. Cancel appointment on 10/02/17 if not needed for 3rd banding.  Please follow up with Jill [name] on 11/22/17 at 3pm.  Thank you.

## 2017-10-02 ENCOUNTER — Encounter: Payer: 59 | Admitting: Gastroenterology

## 2017-10-02 ENCOUNTER — Encounter: Payer: Self-pay | Admitting: Obstetrics & Gynecology

## 2017-10-02 ENCOUNTER — Other Ambulatory Visit: Payer: Self-pay

## 2017-10-02 ENCOUNTER — Ambulatory Visit: Payer: 59 | Admitting: Obstetrics & Gynecology

## 2017-10-02 ENCOUNTER — Other Ambulatory Visit (HOSPITAL_COMMUNITY)
Admission: RE | Admit: 2017-10-02 | Discharge: 2017-10-02 | Disposition: A | Discharge status: Home / Self Care | Payer: 59 | Source: Ambulatory Visit | Attending: Obstetrics & Gynecology | Admitting: Obstetrics & Gynecology

## 2017-10-02 VITALS — BP 140/70 | HR 90 | Resp 18 | Ht 62.5 in | Wt 245.0 lb

## 2017-10-02 DIAGNOSIS — M1990 Unspecified osteoarthritis, unspecified site: Secondary | ICD-10-CM | POA: Insufficient documentation

## 2017-10-02 DIAGNOSIS — H918X3 Other specified hearing loss, bilateral: Secondary | ICD-10-CM | POA: Diagnosis not present

## 2017-10-02 DIAGNOSIS — Z124 Encounter for screening for malignant neoplasm of cervix: Secondary | ICD-10-CM | POA: Diagnosis not present

## 2017-10-02 DIAGNOSIS — Z975 Presence of (intrauterine) contraceptive device: Secondary | ICD-10-CM | POA: Insufficient documentation

## 2017-10-02 DIAGNOSIS — N912 Amenorrhea, unspecified: Secondary | ICD-10-CM | POA: Insufficient documentation

## 2017-10-02 DIAGNOSIS — Z79899 Other long term (current) drug therapy: Secondary | ICD-10-CM | POA: Insufficient documentation

## 2017-10-02 DIAGNOSIS — Z7984 Long term (current) use of oral hypoglycemic drugs: Secondary | ICD-10-CM | POA: Insufficient documentation

## 2017-10-02 DIAGNOSIS — E119 Type 2 diabetes mellitus without complications: Secondary | ICD-10-CM | POA: Diagnosis not present

## 2017-10-02 DIAGNOSIS — Z01419 Encounter for gynecological examination (general) (routine) without abnormal findings: Secondary | ICD-10-CM

## 2017-10-02 NOTE — Progress Notes (Signed)
57 y.o. G1P1001 SingleAfrican AmericanF here for annual exam.  Doing well.  Recently had hemorrhoid banding with Dr. Silverio Decamp.  Really pleased with improvement since banding.  Denies rectal bleeding.  No regular vaginal bleeding.  Feels like she has a light cycles for two or three days every three or four months.  Mirena IUD placed 10/17.  Bleeding is much improved.    Patient's last menstrual period was 05/08/2016.          Sexually active: No.  The current method of family planning is IUD. Mirena placed 05/12/16    Exercising: Yes.    walking Smoker:  no  Health Maintenance: Pap:  03/18/16 Neg   10/22/13 Neg. HR HPV:neg  History of abnormal Pap: yes, h/o endometrial cells on pap 03/18/2016  MMG:  12/27/16 BIRADS1:Neg  Colonoscopy:  03/2015 f/u 7 years  BMD:   Never TDaP:  2018 Pneumonia vaccine(s):  2010 Shingrix:   Not indicated Hep C testing: 09/13/12 Neg  Screening Labs: Endocrinology   reports that  has never smoked. she has never used smokeless tobacco. She reports that she drinks alcohol. She reports that she does not use drugs.  Past Medical History:  Diagnosis Date  . Allergy   . Anemia   . Arthritis   . Asthma    78yrs ago  . Chronic deafness of both ears    left ear has 40% hearing-right ear has 60 % hearing  . Colon polyps   . Complication of anesthesia    Hard to wake up  . Diabetes mellitus without complication ([place])   . Dysmenorrhea   . Migraines    none in last 11 yrs  . Pelvic kidney    left  . Pneumonia   . Post partum depression   . Urinary incontinence     Past Surgical History:  Procedure Laterality Date  . CESAREAN SECTION    . COLONOSCOPY WITH PROPOFOL N/A 03/24/2015   Procedure: COLONOSCOPY WITH PROPOFOL;  Surgeon: Juanita Craver, MD;  Location: [facility];  Service: Endoscopy;  Laterality: N/A;  . HEMORRHOID BANDING  08/2017, 09/2017  . KNEE ARTHROSCOPY WITH MEDIAL MENISECTOMY Left 04/17/2014   Procedure: LEFT KNEE ARTHROSCOPY WITH PARTIAL MEDIAL  MENISCECTOMY AND CHONDROPLASTY;  Surgeon: Johnn Hai, MD;  Location: [facility];  Service: Orthopedics;  Laterality: Left;  . TYMPANOSTOMY TUBE PLACEMENT Right     Current Outpatient Medications  Medication Sig Dispense Refill  . empagliflozin (JARDIANCE) 25 MG TABS tablet Take 25 mg by mouth daily. 30 tablet 11  . FIBER SELECT GUMMIES PO Take by mouth daily.    Marland Kitchen glucose blood (ONETOUCH VERIO) test strip 1 each by Other route daily. And lancets 1/day 100 each 12  . glucose blood test strip Test blood sugar daily. E11.9 100 each 3  . losartan (COZAAR) 25 MG tablet TAKE 1 TABLET BY MOUTH EVERY DAY 30 tablet 11  . metFORMIN (GLUCOPHAGE-XR) 750 MG 24 hr tablet Take 1 tablet (750 mg total) by mouth daily with breakfast. 30 tablet 13  . ONE TOUCH LANCETS MISC TAKE 1 TABLET BY MOUTH EVERY DAY  12   No current facility-administered medications for this visit.     Family History  Problem Relation Age of Onset  . Hypertension Mother   . Arthritis Mother   . Hypercalcemia Mother   . Hypertension Sister   . Deep vein thrombosis Sister   . Pulmonary embolism Sister   . Diabetes Maternal Grandmother   . Heart failure Maternal  Grandmother   . Heart disease Maternal Grandmother   . Hyperlipidemia Maternal Grandmother   . Hypertension Maternal Grandmother   . Hypertension Maternal Grandfather   . Hyperlipidemia Maternal Grandfather   . Breast cancer Paternal Aunt   . Breast cancer Paternal Grandmother     ROS:  Pertinent items are noted in HPI.  Otherwise, a comprehensive ROS was negative.  Exam:   BP 140/70 (BP Location: Right Arm, Patient Position: Sitting, Cuff Size: Large)   Pulse 90   Resp 18   Ht 5' 2.5" (1.588 m)   Wt 245 lb (111.1 kg)   LMP 05/08/2016   BMI 44.10 kg/m   Weight change: -14#  Height: 5' 2.5" (158.8 cm)  Ht Readings from Last 3 Encounters:  10/02/17 5' 2.5" (1.588 m)  09/12/17 5\' 2"  (1.575 m)  08/18/17 5\' 2"  (1.575 m)    General appearance: alert,  cooperative and appears stated age Head: Normocephalic, without obvious abnormality, atraumatic Neck: no adenopathy, supple, symmetrical, trachea midline and thyroid normal to inspection and palpation Lungs: clear to auscultation bilaterally Breasts: normal appearance, no masses or tenderness Heart: regular rate and rhythm Abdomen: soft, non-tender; bowel sounds normal; no masses,  no organomegaly Extremities: extremities normal, atraumatic, no cyanosis or edema Skin: Skin color, texture, turgor normal. No rashes or lesions Lymph nodes: Cervical, supraclavicular, and axillary nodes normal. No abnormal inguinal nodes palpated Neurologic: Grossly normal   Pelvic: External genitalia:  no lesions              Urethra:  normal appearing urethra with no masses, tenderness or lesions              Bartholins and Skenes: normal                 Vagina: normal appearing vagina with normal color and discharge, no lesions              Cervix: no lesions              Pap taken: Yes.   Bimanual Exam:  Uterus:  enlarged, 10 weeks size              Adnexa: no mass, fullness, tenderness               Rectovaginal: deferred due ot recent hemorrhoid banding               Anus:  no lesions  Chaperone was present for exam.  A:  Well Woman with normal exam H/o menorrhagia, Mirena placement 2017, now with minimal bleeding Diabetes, followed by Dr. Loanne Drilling H/O left pelvic kidney Obesity but >30 # weight loss over last two years Recent hemorrhoid banding OSA Asthma  P:   Mammogram guidelines reviewed pap smear and HR HPV obtained today Lab work done with Dr. Loanne Drilling and Dr. Quay Burow Vaccines UTD [facility] obtained today Return annually or prn

## 2017-10-03 LAB — FOLLICLE STIMULATING HORMONE: FSH: 32.7 m[IU]/mL

## 2017-10-04 ENCOUNTER — Telehealth: Payer: Self-pay | Admitting: Obstetrics & Gynecology

## 2017-10-04 LAB — CYTOLOGY - PAP
Adequacy: ABSENT
Diagnosis: NEGATIVE
HPV (WINDOPATH): NOT DETECTED

## 2017-10-04 MED ORDER — FLUCONAZOLE 150 MG PO TABS: 150.0000 mg | ORAL_TABLET | Freq: Once | ORAL | 0 refills | Status: AC

## 2017-10-04 NOTE — Telephone Encounter (Signed)
Spoke with patient. Results given. Patient verbalizes understanding. Rx for Diflucan 150 mg po x 1 repeat in 72 hours #2 0RF sent to pharmacy on file. 02 recall placed. Encounter closed.  Notes recorded by Megan Salon, MD on 10/04/2017 at 3:41 PM EST 02 Pap recall.   Please notify patient that her Pap smear was normal and her high risk HPV was negative. There was yeast noted on the Pap smear. If she is symptomatic, okay to treat with Terazol 7, 1 applicator nightly x7 nights or Diflucan 150 mg x1, repeat in 72 hours. #2/0 refills.  Also, her [place] was 32. At this range she may still have some intermittent spotting. That is not worrisome. I will repeat this again in a year.

## 2017-10-04 NOTE — Telephone Encounter (Signed)
Patient viewed lab results on Mychart and noticed she has a yeast infection. Would like something called to pharmacy.

## 2017-10-16 ENCOUNTER — Telehealth: Payer: Self-pay | Admitting: Obstetrics & Gynecology

## 2017-10-16 NOTE — Telephone Encounter (Signed)
Spoke with patient. Treated for yeast on 2/27, took 2nd diflucan on 3/3, symptoms resolved. Reports "yeasty smell" returned 3-4 days later, denies any other symptoms.  Recommended OV for further evaluation, OV scheduled for 3/12 at 3:45pm with Dr. Sabra Heck.   Routing to provider for final review. Patient is agreeable to disposition. Will close encounter.

## 2017-10-16 NOTE — Telephone Encounter (Signed)
Patient completed medication for yeast infection and thinks she may need it again. Last seen 10/02/17.

## 2017-10-17 ENCOUNTER — Encounter: Payer: Self-pay | Admitting: Obstetrics & Gynecology

## 2017-10-17 ENCOUNTER — Ambulatory Visit (INDEPENDENT_AMBULATORY_CARE_PROVIDER_SITE_OTHER): Payer: 59 | Admitting: Obstetrics & Gynecology

## 2017-10-17 VITALS — BP 132/68 | HR 100 | Temp 98.3°F | Resp 16 | Wt 242.0 lb

## 2017-10-17 DIAGNOSIS — R300 Dysuria: Secondary | ICD-10-CM | POA: Diagnosis not present

## 2017-10-17 DIAGNOSIS — N898 Other specified noninflammatory disorders of vagina: Secondary | ICD-10-CM | POA: Diagnosis not present

## 2017-10-17 DIAGNOSIS — R81 Glycosuria: Secondary | ICD-10-CM | POA: Diagnosis not present

## 2017-10-17 LAB — POCT URINALYSIS DIPSTICK
BILIRUBIN UA: NEGATIVE
KETONES UA: NEGATIVE
Leukocytes, UA: NEGATIVE
Nitrite, UA: NEGATIVE
PH UA: 5 (ref 5.0–8.0)
Protein, UA: NEGATIVE
UROBILINOGEN UA: 0.2 U/dL

## 2017-10-17 MED ORDER — TERCONAZOLE 0.4 % VA CREA: 1.0000 | TOPICAL_CREAM | Freq: Every day | VAGINAL | 0 refills | Status: DC

## 2017-10-17 NOTE — Progress Notes (Signed)
GYNECOLOGY  VISIT  CC:   Vaginal itching  HPI: 57 y.o. G15P1001 Single African American female here for vaginal itching, odor and she reports she feels a little "burning sensation" when she voids first thing in the morning.    Was seen for AEX on 10/02/17.  Yeast was noted on Pap smear and she was treated with Diflucan.  Reports symptoms improved but have come right back.  She realizes now that she's been itching for awhile but she wasn't sure what it was.  Now she is aware.    Also reports she has been feeling fatigued for several weeks.  Feels like she has a "sour stomach".  Not exercising but happy about the weather change so she can get back outside and start walking in the evening   Having some headaches the last few weeks and feel sthis is likely due to some increased stressors at work.    GYNECOLOGIC HISTORY: No LMP recorded. Patient is not currently having periods (Reason: IUD). Contraception: Mirena placed 05/12/16 Menopausal hormone therapy: None  Patient Active Problem List   Diagnosis Date Noted  . Constipation 05/29/2017  . Hemorrhoids 05/29/2017  . Hypercalcemia 05/29/2017  . Hematuria 09/07/2016  . IUD (intrauterine device) in place 07/05/2016  . Menorrhagia with irregular cycle 05/03/2016  . Fibroids, intramural 05/03/2016  . Pelvic kidney   . Diabetes mellitus without complication ([place])   . OBESITY, MORBID 07/30/2009  . OBSTRUCTIVE SLEEP APNEA 05/19/2009  . MOTION SICKNESS 03/12/2007  . ASTHMA 01/31/2007  . GERD 01/31/2007  . MIGRAINE, MENSTRUAL 01/31/2007    Past Medical History:  Diagnosis Date  . Allergy   . Anemia   . Arthritis   . Asthma    46yrs ago  . Chronic deafness of both ears    left ear has 40% hearing-right ear has 60 % hearing  . Colon polyps   . Complication of anesthesia    Hard to wake up  . Diabetes mellitus without complication ([place])   . Dysmenorrhea   . Migraines    none in last 11 yrs  . Pelvic kidney    left  . Pneumonia   .  Post partum depression   . Urinary incontinence     Past Surgical History:  Procedure Laterality Date  . CESAREAN SECTION    . COLONOSCOPY WITH PROPOFOL N/A 03/24/2015   Procedure: COLONOSCOPY WITH PROPOFOL;  Surgeon: Juanita Craver, MD;  Location: [facility];  Service: Endoscopy;  Laterality: N/A;  . HEMORRHOID BANDING  08/2017, 09/2017  . KNEE ARTHROSCOPY WITH MEDIAL MENISECTOMY Left 04/17/2014   Procedure: LEFT KNEE ARTHROSCOPY WITH PARTIAL MEDIAL MENISCECTOMY AND CHONDROPLASTY;  Surgeon: Johnn Hai, MD;  Location: [facility];  Service: Orthopedics;  Laterality: Left;  . TYMPANOSTOMY TUBE PLACEMENT Right     MEDS:   Current Outpatient Medications on File Prior to Visit  Medication Sig Dispense Refill  . empagliflozin (JARDIANCE) 25 MG TABS tablet Take 25 mg by mouth daily. 30 tablet 11  . FIBER SELECT GUMMIES PO Take by mouth daily.    Marland Kitchen glucose blood (ONETOUCH VERIO) test strip 1 each by Other route daily. And lancets 1/day 100 each 12  . glucose blood test strip Test blood sugar daily. E11.9 100 each 3  . losartan (COZAAR) 25 MG tablet TAKE 1 TABLET BY MOUTH EVERY DAY 30 tablet 11  . metFORMIN (GLUCOPHAGE-XR) 750 MG 24 hr tablet Take 1 tablet (750 mg total) by mouth daily with breakfast. 30 tablet 13  .  ONE TOUCH LANCETS MISC TAKE 1 TABLET BY MOUTH EVERY DAY  12   No current facility-administered medications on file prior to visit.     ALLERGIES: Adhesive [tape]; Latex; Toradol [ketorolac tromethamine]; and Tramadol  Family History  Problem Relation Age of Onset  . Hypertension Mother   . Arthritis Mother   . Hypercalcemia Mother   . Hypertension Sister   . Deep vein thrombosis Sister   . Pulmonary embolism Sister   . Diabetes Maternal Grandmother   . Heart failure Maternal Grandmother   . Heart disease Maternal Grandmother   . Hyperlipidemia Maternal Grandmother   . Hypertension Maternal Grandmother   . Hypertension Maternal Grandfather   . Hyperlipidemia Maternal  Grandfather   . Breast cancer Paternal Aunt   . Breast cancer Paternal Grandmother     SH:  Single, non smoker  Review of Systems  Genitourinary: Positive for dysuria.       Vaginal itching  Loss of urine with sneeze  Night urination   Skin: Positive for itching.  All other systems reviewed and are negative.   PHYSICAL EXAMINATION:    BP 132/68 (BP Location: Left Arm, Patient Position: Sitting, Cuff Size: Large)   Pulse 100   Temp 98.3 F (36.8 C) (Oral)   Resp 16   Wt 242 lb (109.8 kg)   BMI 43.56 kg/m     General appearance: alert, cooperative and appears stated age Abdomen: soft, non-tender; bowel sounds normal; no masses,  no organomegaly  Pelvic: External genitalia:  no lesions              Urethra:  normal appearing urethra with no masses, tenderness or lesions              Bartholins and Skenes: normal                 Vagina: normal appearing vagina with normal color and whitish discharge noted, no lesions              Cervix: no lesions              Bimanual Exam:  Uterus:  normal size, contour, position, consistency, mobility, non-tender              Adnexa: no mass, fullness, tenderness  Chaperone was present for exam.  Assessment: Vaginal discharge, itching Glucosuria, possibly due to medication Fatigue and increased headaches  Plan: HbA1C obtained today.  CMP obtained. Affirm pending.   Urine culture pending Rx for Terazol 7 to pharmacy as this is likely yeast.

## 2017-10-18 ENCOUNTER — Telehealth: Payer: Self-pay | Admitting: *Deleted

## 2017-10-18 LAB — COMPREHENSIVE METABOLIC PANEL
A/G RATIO: 1.4 (ref 1.2–2.2)
ALT: 22 IU/L (ref 0–32)
AST: 13 IU/L (ref 0–40)
Albumin: 4.6 g/dL (ref 3.5–5.5)
Alkaline Phosphatase: 99 IU/L (ref 39–117)
BILIRUBIN TOTAL: 0.3 mg/dL (ref 0.0–1.2)
BUN / CREAT RATIO: 12 (ref 9–23)
BUN: 14 mg/dL (ref 6–24)
CHLORIDE: 104 mmol/L (ref 96–106)
CO2: 23 mmol/L (ref 20–29)
Calcium: 11.2 mg/dL — ABNORMAL HIGH (ref 8.7–10.2)
Creatinine, Ser: 1.15 mg/dL — ABNORMAL HIGH (ref 0.57–1.00)
GFR calc non Af Amer: 54 mL/min/{1.73_m2} — ABNORMAL LOW (ref >59)
GFR, EST AFRICAN AMERICAN: 62 mL/min/{1.73_m2} (ref >59)
Globulin, Total: 3.3 g/dL (ref 1.5–4.5)
Glucose: 162 mg/dL — ABNORMAL HIGH (ref 65–99)
Potassium: 4.3 mmol/L (ref 3.5–5.2)
Sodium: 143 mmol/L (ref 134–144)
TOTAL PROTEIN: 7.9 g/dL (ref 6.0–8.5)

## 2017-10-18 LAB — VAGINITIS/VAGINOSIS, DNA PROBE
CANDIDA SPECIES: NEGATIVE
Gardnerella vaginalis: NEGATIVE
Trichomonas vaginosis: NEGATIVE

## 2017-10-18 LAB — URINE CULTURE

## 2017-10-18 LAB — HEMOGLOBIN A1C
Est. average glucose Bld gHb Est-mCnc: 146 mg/dL
Hgb A1c MFr Bld: 6.7 % — ABNORMAL HIGH (ref 4.8–5.6)

## 2017-10-18 NOTE — Telephone Encounter (Signed)
Notes recorded by Megan Salon, MD on 10/18/2017 at 6:05 AM EDT Please let pt know that her HbA1C is 6.7 and her CMP showed her glucose level at 162 in the office. I'm not sure her monitor is working well as she said her glucose was in the 80's yesterday after lunch. Also, her calcium is further elevated this year. PTH was mildly elevated several months ago. I think this needs to be repeated and she may need special test on her parathyroid glands. Does she want to see Dr. Loanne Drilling again?   Left voicemail to call back.

## 2017-10-18 NOTE — Telephone Encounter (Signed)
-----   Message from Megan Salon, MD sent at 10/18/2017  8:22 AM EDT ----- Please let pt know her swab did not show yeast or BV.  Ok to use the terazol as she may have some skin irritation from all the sugar that is being spilled in her urine.

## 2017-10-18 NOTE — Telephone Encounter (Signed)
Pt notified. Verbalized understanding. Patient states she has appt with Dr. Loanne [name] next month. Dr. Lestine Box

## 2017-10-19 DIAGNOSIS — D1801 Hemangioma of skin and subcutaneous tissue: Secondary | ICD-10-CM | POA: Diagnosis not present

## 2017-10-19 DIAGNOSIS — D2261 Melanocytic nevi of right upper limb, including shoulder: Secondary | ICD-10-CM | POA: Diagnosis not present

## 2017-10-19 DIAGNOSIS — L308 Other specified dermatitis: Secondary | ICD-10-CM | POA: Diagnosis not present

## 2017-11-22 ENCOUNTER — Other Ambulatory Visit: Payer: Self-pay | Admitting: Internal Medicine

## 2017-11-22 ENCOUNTER — Ambulatory Visit: Payer: 59 | Admitting: Gastroenterology

## 2017-11-22 DIAGNOSIS — Z139 Encounter for screening, unspecified: Secondary | ICD-10-CM

## 2017-12-04 ENCOUNTER — Ambulatory Visit: Payer: 59 | Admitting: Endocrinology

## 2018-01-02 ENCOUNTER — Ambulatory Visit
Admission: RE | Admit: 2018-01-02 | Discharge: 2018-01-02 | Disposition: A | Discharge status: Home / Self Care | Payer: 59 | Source: Ambulatory Visit | Attending: Internal Medicine | Admitting: Internal Medicine

## 2018-01-02 DIAGNOSIS — Z139 Encounter for screening, unspecified: Secondary | ICD-10-CM

## 2018-01-02 DIAGNOSIS — Z1231 Encounter for screening mammogram for malignant neoplasm of breast: Secondary | ICD-10-CM | POA: Diagnosis not present

## 2018-02-01 ENCOUNTER — Ambulatory Visit: Payer: 59 | Admitting: Endocrinology

## 2018-02-01 ENCOUNTER — Ambulatory Visit: Payer: 59 | Admitting: Gastroenterology

## 2018-05-30 ENCOUNTER — Encounter: Payer: 59 | Admitting: Internal Medicine

## 2018-11-09 ENCOUNTER — Other Ambulatory Visit: Payer: Self-pay

## 2018-11-09 ENCOUNTER — Observation Stay (HOSPITAL_COMMUNITY)
Admission: EM | Admit: 2018-11-09 | Discharge: 2018-11-10 | Disposition: A | Discharge status: Home / Self Care | Payer: 59 | Attending: Internal Medicine | Admitting: Internal Medicine

## 2018-11-09 ENCOUNTER — Encounter (HOSPITAL_COMMUNITY): Payer: Self-pay | Admitting: Emergency Medicine

## 2018-11-09 ENCOUNTER — Emergency Department (HOSPITAL_COMMUNITY): Payer: 59

## 2018-11-09 DIAGNOSIS — R1013 Epigastric pain: Secondary | ICD-10-CM | POA: Diagnosis not present

## 2018-11-09 DIAGNOSIS — Z91040 Latex allergy status: Secondary | ICD-10-CM | POA: Diagnosis not present

## 2018-11-09 DIAGNOSIS — Z79899 Other long term (current) drug therapy: Secondary | ICD-10-CM | POA: Diagnosis not present

## 2018-11-09 DIAGNOSIS — IMO0002 Reserved for concepts with insufficient information to code with codable children: Secondary | ICD-10-CM

## 2018-11-09 DIAGNOSIS — K219 Gastro-esophageal reflux disease without esophagitis: Secondary | ICD-10-CM | POA: Diagnosis present

## 2018-11-09 DIAGNOSIS — R1011 Right upper quadrant pain: Secondary | ICD-10-CM

## 2018-11-09 DIAGNOSIS — K760 Fatty (change of) liver, not elsewhere classified: Secondary | ICD-10-CM | POA: Diagnosis not present

## 2018-11-09 DIAGNOSIS — K5900 Constipation, unspecified: Secondary | ICD-10-CM | POA: Diagnosis present

## 2018-11-09 DIAGNOSIS — R7989 Other specified abnormal findings of blood chemistry: Secondary | ICD-10-CM | POA: Diagnosis present

## 2018-11-09 DIAGNOSIS — Z6841 Body Mass Index (BMI) 40.0 and over, adult: Secondary | ICD-10-CM | POA: Diagnosis not present

## 2018-11-09 DIAGNOSIS — E119 Type 2 diabetes mellitus without complications: Secondary | ICD-10-CM

## 2018-11-09 DIAGNOSIS — R1010 Upper abdominal pain, unspecified: Secondary | ICD-10-CM

## 2018-11-09 DIAGNOSIS — R739 Hyperglycemia, unspecified: Secondary | ICD-10-CM

## 2018-11-09 DIAGNOSIS — R945 Abnormal results of liver function studies: Secondary | ICD-10-CM | POA: Diagnosis not present

## 2018-11-09 DIAGNOSIS — R1030 Lower abdominal pain, unspecified: Principal | ICD-10-CM | POA: Insufficient documentation

## 2018-11-09 DIAGNOSIS — E1165 Type 2 diabetes mellitus with hyperglycemia: Secondary | ICD-10-CM | POA: Insufficient documentation

## 2018-11-09 DIAGNOSIS — Z9119 Patient's noncompliance with other medical treatment and regimen: Secondary | ICD-10-CM | POA: Insufficient documentation

## 2018-11-09 DIAGNOSIS — K5909 Other constipation: Secondary | ICD-10-CM | POA: Insufficient documentation

## 2018-11-09 DIAGNOSIS — Z888 Allergy status to other drugs, medicaments and biological substances status: Secondary | ICD-10-CM | POA: Diagnosis not present

## 2018-11-09 DIAGNOSIS — Z833 Family history of diabetes mellitus: Secondary | ICD-10-CM | POA: Diagnosis not present

## 2018-11-09 DIAGNOSIS — R7401 Elevation of levels of liver transaminase levels: Secondary | ICD-10-CM

## 2018-11-09 DIAGNOSIS — R109 Unspecified abdominal pain: Secondary | ICD-10-CM | POA: Diagnosis present

## 2018-11-09 DIAGNOSIS — J4520 Mild intermittent asthma, uncomplicated: Secondary | ICD-10-CM | POA: Diagnosis not present

## 2018-11-09 DIAGNOSIS — R740 Nonspecific elevation of levels of transaminase and lactic acid dehydrogenase [LDH]: Secondary | ICD-10-CM | POA: Diagnosis not present

## 2018-11-09 DIAGNOSIS — E1169 Type 2 diabetes mellitus with other specified complication: Secondary | ICD-10-CM

## 2018-11-09 LAB — CBC WITH DIFFERENTIAL/PLATELET
Abs Immature Granulocytes: 0.03 10*3/uL (ref 0.00–0.07)
Basophils Absolute: 0 10*3/uL (ref 0.0–0.1)
Basophils Relative: 0 %
Eosinophils Absolute: 0 10*3/uL (ref 0.0–0.5)
Eosinophils Relative: 1 %
HCT: 41.6 % (ref 36.0–46.0)
Hemoglobin: 13.7 g/dL (ref 12.0–15.0)
Immature Granulocytes: 0 %
Lymphocytes Relative: 8 %
Lymphs Abs: 0.7 10*3/uL (ref 0.7–4.0)
MCH: 27.6 pg (ref 26.0–34.0)
MCHC: 32.9 g/dL (ref 30.0–36.0)
MCV: 83.7 fL (ref 80.0–100.0)
Monocytes Absolute: 0.4 10*3/uL (ref 0.1–1.0)
Monocytes Relative: 4 %
Neutro Abs: 7.5 10*3/uL (ref 1.7–7.7)
Neutrophils Relative %: 87 %
Platelets: 176 10*3/uL (ref 150–400)
RBC: 4.97 MIL/uL (ref 3.87–5.11)
RDW: 13.7 % (ref 11.5–15.5)
WBC: 8.6 10*3/uL (ref 4.0–10.5)
nRBC: 0 % (ref 0.0–0.2)

## 2018-11-09 LAB — COMPREHENSIVE METABOLIC PANEL
ALT: 274 U/L — ABNORMAL HIGH (ref 0–44)
AST: 496 U/L — ABNORMAL HIGH (ref 15–41)
Albumin: 3.7 g/dL (ref 3.5–5.0)
Alkaline Phosphatase: 159 U/L — ABNORMAL HIGH (ref 38–126)
Anion gap: 11 (ref 5–15)
BUN: 11 mg/dL (ref 6–20)
CO2: 23 mmol/L (ref 22–32)
Calcium: 10.3 mg/dL (ref 8.9–10.3)
Chloride: 102 mmol/L (ref 98–111)
Creatinine, Ser: 1.14 mg/dL — ABNORMAL HIGH (ref 0.44–1.00)
GFR calc Af Amer: >60 mL/min (ref >60)
GFR calc non Af Amer: 54 mL/min — ABNORMAL LOW (ref >60)
Glucose, Bld: 382 mg/dL — ABNORMAL HIGH (ref 70–99)
Potassium: 4.2 mmol/L (ref 3.5–5.1)
Sodium: 136 mmol/L (ref 135–145)
Total Bilirubin: 0.9 mg/dL (ref 0.3–1.2)
Total Protein: 7.4 g/dL (ref 6.5–8.1)

## 2018-11-09 LAB — URINALYSIS, ROUTINE W REFLEX MICROSCOPIC
Bacteria, UA: NONE SEEN
Bilirubin Urine: NEGATIVE
Glucose, UA: >=500 mg/dL — AB
Hgb urine dipstick: NEGATIVE
Ketones, ur: NEGATIVE mg/dL
Leukocytes,Ua: NEGATIVE
Nitrite: NEGATIVE
Protein, ur: NEGATIVE mg/dL
Specific Gravity, Urine: 1.023 (ref 1.005–1.030)
pH: 5 (ref 5.0–8.0)

## 2018-11-09 LAB — HEMOGLOBIN A1C
Hgb A1c MFr Bld: 10.8 % — ABNORMAL HIGH (ref 4.8–5.6)
Mean Plasma Glucose: 263.26 mg/dL

## 2018-11-09 LAB — GLUCOSE, CAPILLARY
Glucose-Capillary: 267 mg/dL — ABNORMAL HIGH (ref 70–99)
Glucose-Capillary: 175 mg/dL — ABNORMAL HIGH (ref 70–99)

## 2018-11-09 LAB — ACETAMINOPHEN LEVEL: Acetaminophen (Tylenol), Serum: <10 ug/mL — ABNORMAL LOW (ref 10–30)

## 2018-11-09 LAB — CBG MONITORING, ED: Glucose-Capillary: 379 mg/dL — ABNORMAL HIGH (ref 70–99)

## 2018-11-09 LAB — LIPASE, BLOOD: Lipase: 27 U/L (ref 11–51)

## 2018-11-09 MED ORDER — INSULIN ASPART 100 UNIT/ML ~~LOC~~ SOLN: 0.0000 [IU] | Freq: Every day | SUBCUTANEOUS | Status: DC

## 2018-11-09 MED ORDER — OXYCODONE HCL 5 MG PO TABS: 5.0000 mg | ORAL_TABLET | ORAL | Status: DC | PRN

## 2018-11-09 MED ORDER — MORPHINE SULFATE (PF) 4 MG/ML IV SOLN
4.0000 mg | Freq: Once | INTRAVENOUS | Status: AC
  Administered 2018-11-09: 05:00:00 4 mg via INTRAVENOUS
  Filled 2018-11-09: qty 1

## 2018-11-09 MED ORDER — ENOXAPARIN SODIUM 40 MG/0.4ML ~~LOC~~ SOLN
40.0000 mg | SUBCUTANEOUS | Status: DC
  Administered 2018-11-09 – 2018-11-10 (×2): 40 mg via SUBCUTANEOUS
  Filled 2018-11-09 (×2): qty 0.4

## 2018-11-09 MED ORDER — SODIUM CHLORIDE 0.9 % IV BOLUS
1000.0000 mL | Freq: Once | INTRAVENOUS | Status: AC
  Administered 2018-11-09: 1000 mL via INTRAVENOUS

## 2018-11-09 MED ORDER — INSULIN ASPART 100 UNIT/ML ~~LOC~~ SOLN
0.0000 [IU] | Freq: Three times a day (TID) | SUBCUTANEOUS | Status: DC
  Administered 2018-11-09: 2 [IU] via SUBCUTANEOUS
  Administered 2018-11-09: 5 [IU] via SUBCUTANEOUS
  Administered 2018-11-09: 08:00:00 9 [IU] via SUBCUTANEOUS
  Administered 2018-11-10 (×2): 3 [IU] via SUBCUTANEOUS

## 2018-11-09 MED ORDER — ONDANSETRON HCL 4 MG/2ML IJ SOLN
4.0000 mg | Freq: Once | INTRAMUSCULAR | Status: AC
  Administered 2018-11-09: 4 mg via INTRAVENOUS
  Filled 2018-11-09: qty 2

## 2018-11-09 MED ORDER — IOPAMIDOL (ISOVUE-300) INJECTION 61%
100.0000 mL | Freq: Once | INTRAVENOUS | Status: AC | PRN
  Administered 2018-11-09: 07:00:00 100 mL via INTRAVENOUS

## 2018-11-09 NOTE — Progress Notes (Signed)
Called Infection prevention regarding exposure to the Corona virus at pts work place, gave instructions to put the pt on droplet and contact and also use face shield mask and goggles if available.

## 2018-11-09 NOTE — Progress Notes (Signed)
Inpatient Diabetes Program Recommendations  AACE/ADA: New Consensus Statement on Inpatient Glycemic Control (2015)  Target Ranges:  Prepandial:   less than 140 mg/dL      Peak postprandial:   less than 180 mg/dL (1-2 hours)      Critically ill patients:  140 - 180 mg/dL   Lab Results  Component Value Date   GLUCAP 267 (H) 11/09/2018   HGBA1C 10.8 (H) 11/09/2018    Review of Glycemic Control  Diabetes history: type 2 Outpatient Diabetes medications: had taken Januvia and Metformin in the past Current orders for Inpatient glycemic control: Novolog SENSITIVE TID & HS  Inpatient Diabetes Program Recommendations:    Spoke with patient on the phone about her diabetes. Was diagnosed about 10 years ago. Had been on jardiance and Metformin in the past, but had been taking it recently. Has not been checking blood sugars routinely. Sees Dr. Billey Gosling and saw Dr. Loanne Drilling over a year ago. Discussed HgbA1C of 10.8% and blood sugars. States that she does drink sweet tea and sodas. Knows that she needs to cut back on sweet drinks. Does cook meals at home.States that she has a home blood glucose meter and strips.   Will continue to monitor blood sugars while in the hospital.  Harvel Ricks RN BSN CDE Diabetes Coordinator Pager: [phone]  8am-5pm

## 2018-11-09 NOTE — ED Notes (Signed)
CBG 375 but has not had her meds for 1 month

## 2018-11-09 NOTE — ED Provider Notes (Signed)
[facility] EMERGENCY DEPARTMENT Provider Note   CSN: [id number] Arrival date & time: 11/09/18  0401    History   Chief Complaint Chief Complaint  Patient presents with  . Abdominal Pain    HPI Jill [name] is a 58 y.o. female.    58 year old female with a history of diabetes (noncompliant with medications), asthma, chronic deafness presents to the emergency department for abdominal pain which woke her from sleep at 0245.  Reports pain in her epigastrium which radiates to her right upper quadrant and around to her back.  This has been constant and waxing and waning in severity since onset.  She had one episode of vomiting initially.  Notes persistent nausea.  She also had one episode of watery, nonbloody diarrhea.  Denies fevers, urinary symptoms, sick contacts.  Abdominal surgical history significant for cesarean section.  She ate fried chicken and rice and a butter sauce for dinner tonight.  No meds PTA.  The history is provided by the patient. No language interpreter was used.  Abdominal Pain    Past Medical History:  Diagnosis Date  . Allergy   . Anemia   . Arthritis   . Asthma    34yrs ago  . Chronic deafness of both ears    left ear has 40% hearing-right ear has 60 % hearing  . Colon polyps   . Complication of anesthesia    Hard to wake up  . Diabetes mellitus without complication ([place])   . Dysmenorrhea   . Migraines    none in last 11 yrs  . Pelvic kidney    left  . Pneumonia   . Post partum depression   . Urinary incontinence     Patient Active Problem List   Diagnosis Date Noted  . Constipation 05/29/2017  . Hemorrhoids 05/29/2017  . Hypercalcemia 05/29/2017  . Hematuria 09/07/2016  . IUD (intrauterine device) in place 07/05/2016  . Menorrhagia with irregular cycle 05/03/2016  . Fibroids, intramural 05/03/2016  . Pelvic kidney   . Diabetes mellitus without complication ([place])   . OBESITY, MORBID 07/30/2009  . OBSTRUCTIVE SLEEP  APNEA 05/19/2009  . MOTION SICKNESS 03/12/2007  . ASTHMA 01/31/2007  . GERD 01/31/2007  . MIGRAINE, MENSTRUAL 01/31/2007    Past Surgical History:  Procedure Laterality Date  . CESAREAN SECTION    . COLONOSCOPY WITH PROPOFOL N/A 03/24/2015   Procedure: COLONOSCOPY WITH PROPOFOL;  Surgeon: Juanita Craver, MD;  Location[facility];  Service: Endoscopy;  Laterality: N/A;  . HEMORRHOID BANDING  08/2017, 09/2017  . KNEE ARTHROSCOPY WITH MEDIAL MENISECTOMY Left 04/17/2014   Procedure: LEFT KNEE ARTHROSCOPY WITH PARTIAL MEDIAL MENISCECTOMY AND CHONDROPLASTY;  Surgeon: Johnn Hai, MD;  Location: [facility];  Service: Orthopedics;  Laterality: Left;  . TYMPANOSTOMY TUBE PLACEMENT Right      OB History    Gravida  1   Para  1   Term  1   Preterm      AB      Living  1     SAB      TAB      Ectopic      Multiple      Live Births  1            Home Medications    Prior to Admission medications   Medication Sig Start Date End Date Taking? Authorizing Provider  empagliflozin (JARDIANCE) 25 MG TABS tablet Take 25 mg by mouth daily. Patient not taking: Reported on  11/09/2018 05/29/17   Renato Shin, MD  glucose blood [facility] VERIO) test strip 1 each by Other route daily. And lancets 1/day 05/12/16   Renato Shin, MD  glucose blood test strip Test blood sugar daily. E11.9 06/02/15   Ivar Drape D, PA  losartan (COZAAR) 25 MG tablet TAKE 1 TABLET BY MOUTH EVERY DAY Patient not taking: Reported on 11/09/2018 04/18/17   Megan Salon, MD  metFORMIN (GLUCOPHAGE-XR) 750 MG 24 hr tablet Take 1 tablet (750 mg total) by mouth daily with breakfast. Patient not taking: Reported on 11/09/2018 05/29/17   Renato Shin, MD  ONE TOUCH LANCETS MISC TAKE 1 TABLET BY MOUTH EVERY DAY 05/12/16   [provider]  terconazole (TERAZOL 7) 0.4 % vaginal cream Place 1 applicator vaginally at bedtime. Patient not taking: Reported on 11/09/2018 10/17/17   Megan Salon, MD    Family  History Family History  Problem Relation Age of Onset  . Hypertension Mother   . Arthritis Mother   . Hypercalcemia Mother   . Hypertension Sister   . Deep vein thrombosis Sister   . Pulmonary embolism Sister   . Diabetes Maternal Grandmother   . Heart failure Maternal Grandmother   . Heart disease Maternal Grandmother   . Hyperlipidemia Maternal Grandmother   . Hypertension Maternal Grandmother   . Hypertension Maternal Grandfather   . Hyperlipidemia Maternal Grandfather   . Breast cancer Paternal Aunt        diagnosed in her 38's  . Breast cancer Paternal Grandmother     Social History Social History   Tobacco Use  . Smoking status: Never Smoker  . Smokeless tobacco: Never Used  Substance Use Topics  . Alcohol use: Yes    Alcohol/week: 0.0 standard drinks    Comment: rarely  . Drug use: No     Allergies   Adhesive [tape]; Latex; Toradol [ketorolac tromethamine]; and Tramadol   Review of Systems Review of Systems  Gastrointestinal: Positive for abdominal pain.  Ten systems reviewed and are negative for acute change, except as noted in the HPI.    Physical Exam Updated Vital Signs BP (!) 131/59   Pulse 100   Temp 99.5 F (37.5 C)   Resp 20   SpO2 100%   Physical Exam Vitals signs and nursing note reviewed.  Constitutional:      General: She is not in acute distress.    Appearance: She is well-developed. She is not diaphoretic.     Comments: Nontoxic appearing and in NAD. Seems uncomfortable.   HENT:     Head: Normocephalic and atraumatic.  Eyes:     General: No scleral icterus.    Conjunctiva/sclera: Conjunctivae normal.  Neck:     Musculoskeletal: Normal range of motion.  Cardiovascular:     Rate and Rhythm: Normal rate and regular rhythm.     Pulses: Normal pulses.  Pulmonary:     Effort: Pulmonary effort is normal. No respiratory distress.     Breath sounds: No stridor. No wheezing.     Comments: Respirations even and unlabored Abdominal:      Palpations: There is no mass.     Tenderness: There is abdominal tenderness. There is no guarding.     Hernia: No hernia is present.     Comments: TTP in the RUQ with voluntary guarding. Abdomen is obese, soft. No peritoneal signs.  Musculoskeletal: Normal range of motion.  Skin:    General: Skin is warm and dry.     Coloration:  Skin is not pale.     Findings: No erythema or rash.  Neurological:     Mental Status: She is alert and oriented to person, place, and time.     Coordination: Coordination normal.     Comments: Moving all extremities spontaneously.  Psychiatric:        Behavior: Behavior normal.      ED Treatments / Results  Labs (all labs ordered are listed, but only abnormal results are displayed) Labs Reviewed  COMPREHENSIVE METABOLIC PANEL - Abnormal; Notable for the following components:      Result Value   Glucose, Bld 382 (*)    Creatinine, Ser 1.14 (*)    AST 496 (*)    ALT 274 (*)    Alkaline Phosphatase 159 (*)    GFR calc non Af Amer 54 (*)    All other components within normal limits  CBC WITH DIFFERENTIAL/PLATELET  LIPASE, BLOOD  URINALYSIS, ROUTINE W REFLEX MICROSCOPIC  ACETAMINOPHEN LEVEL  HEPATITIS PANEL, ACUTE    EKG None  Radiology US Abdomen Limited  Result Date: 11/09/2018 CLINICAL DATA:  Right upper quadrant pain EXAM: ULTRASOUND ABDOMEN LIMITED RIGHT UPPER QUADRANT COMPARISON:  10/04/2016 FINDINGS: Gallbladder: No gallstones or wall thickening visualized. No sonographic Murphy sign noted by sonographer. Common bile duct: Diameter: 6 mm.  Where visualized, no filling defect. Liver: Echogenic liver with sparing at the gallbladder fossa. Portal vein is patent on color Doppler imaging with normal direction of blood flow towards the liver. IMPRESSION: 1. No acute finding or cholelithiasis. 2. Hepatic steatosis. Electronically Signed   By: Monte Fantasia M.D.   On: 11/09/2018 05:22    Procedures Procedures (including critical care time)   Medications Ordered in ED Medications  sodium chloride 0.9 % bolus 1,000 mL (0 mLs Intravenous Stopped 11/09/18 0605)  morphine 4 MG/ML injection 4 mg (4 mg Intravenous Given 11/09/18 0451)  ondansetron (ZOFRAN) injection 4 mg (4 mg Intravenous Given 11/09/18 0451)     Initial Impression / Assessment and Plan / ED Course  I have reviewed the triage vital signs and the nursing notes.  Pertinent labs & imaging results that were available during my care of the patient were reviewed by me and considered in my medical decision making (see chart for details).        65:72 AM 58 year old female with a history of diabetes (currently off her medications) presents to the emergency department for evaluation of sudden onset epigastric and right upper quadrant pain which woke her from sleep just before 3 AM.  Had one episode of vomiting and a loose BM.  Has focal tenderness in the right upper quadrant on my exam.  Abdomen is obese without peritoneal signs.  She appears uncomfortable.  Morphine ordered for pain control.  Will obtain labs and right upper quadrant ultrasound.  5:46 AM Patient with negative [place]. Pending lipase, but also question possible kidney stone given sudden symptom onset. UA added.  6:20 AM Patient with improved pain on repeat assessment.  Her LFTs were noted to be significantly elevated.  This is new for her.  She denies any recent Tylenol use as well as any history of IV drug use.  She has no prior history of hepatitis.  We will add Tylenol level as well as hepatitis panel for completeness.  The patient may benefit from HIDA scan and trending of LFTs inpatient.  No fever or leukocytosis to suggest infectious etiology.  It is also possible the patient may have recently passed a  solitary gallstone.  7:05 AM Patient signed out to Benedetto Goad, PA-C at shift change pending admission consultation.   Final Clinical Impressions(s) / ED Diagnoses   Final diagnoses:  Right upper quadrant  abdominal pain  Transaminitis  Hyperglycemia    ED Discharge Orders    None       Antonietta Breach, PA-C 11/09/18 [id number]    Fatima Blank, MD 11/09/18 [phone]

## 2018-11-09 NOTE — H&P (Signed)
History and Physical    Jill [name] UDT:[id number] DOB: 11-02-1960 DOA: 11/09/2018  PCP: Binnie Rail, MD  Patient coming from: home   I have personally briefly reviewed patient's old medical records available.   Chief Complaint: abdominal pain X 1 day   HPI: Jill [name] is a 58 y.o. female with medical history significant of type 2 diabetes, last known A1c 6.21-year ago, currently not on any treatment due to noncompliance, obesity and mild intermittent asthma presenting to the emergency room with sudden onset of abdomen pain.  Started last night after dinner, epigastric and right upper quadrant, crampy, moderate in intensity, initially radiated to the right flank, improved somehow in the emergency room after morphine.  No associated nausea or vomiting.  She has chronic constipation and occasional takes over-the-counter MiraLAX.  No fever or chills.  No flulike symptoms.  No chest pain or shortness of breath. No over-the-counter medication use.  No Tylenol use.  Does not drink alcohol.  No recent injury or blood transfusion.  Not sexually active currently. ED Course: Hemodynamically stable.  Blood sugars more than 300.  CBC normal.  LFTs show normal bilirubin, elevated AST ALT on 400s with mildly elevated alkaline phosphatase. Right upper quadrant ultrasound was normal. CT scan of the abdomen pelvis shows fatty liver no other changes. Due to the significant transaminases elevation, ER physician requested observation and monitoring to ensure stabilization.  Review of Systems: As per HPI otherwise 10 point review of systems negative.    Past Medical History:  Diagnosis Date  . Allergy   . Anemia   . Arthritis   . Asthma    48yrs ago  . Chronic deafness of both ears    left ear has 40% hearing-right ear has 60 % hearing  . Colon polyps   . Complication of anesthesia    Hard to wake up  . Diabetes mellitus without complication ([place])   . Dysmenorrhea   . Migraines    none  in last 11 yrs  . Pelvic kidney    left  . Pneumonia   . Post partum depression   . Urinary incontinence     Past Surgical History:  Procedure Laterality Date  . CESAREAN SECTION    . COLONOSCOPY WITH PROPOFOL N/A 03/24/2015   Procedure: COLONOSCOPY WITH PROPOFOL;  Surgeon: Juanita Craver, MD;  Location: [facility];  Service: Endoscopy;  Laterality: N/A;  . HEMORRHOID BANDING  08/2017, 09/2017  . KNEE ARTHROSCOPY WITH MEDIAL MENISECTOMY Left 04/17/2014   Procedure: LEFT KNEE ARTHROSCOPY WITH PARTIAL MEDIAL MENISCECTOMY AND CHONDROPLASTY;  Surgeon: Johnn Hai, MD;  Location: [facility];  Service: Orthopedics;  Laterality: Left;  . TYMPANOSTOMY TUBE PLACEMENT Right      reports that she has never smoked. She has never used smokeless tobacco. She reports current alcohol use. She reports that she does not use drugs.  Allergies  Allergen Reactions  . Adhesive [Tape]     Pulls skin off  . Latex Itching  . Toradol [Ketorolac Tromethamine]     Joint pain and swelling  . Tramadol Other (See Comments)    JOINT PAIN    Family History  Problem Relation Age of Onset  . Hypertension Mother   . Arthritis Mother   . Hypercalcemia Mother   . Hypertension Sister   . Deep vein thrombosis Sister   . Pulmonary embolism Sister   . Diabetes Maternal Grandmother   . Heart failure Maternal Grandmother   . Heart disease Maternal Grandmother   .  Hyperlipidemia Maternal Grandmother   . Hypertension Maternal Grandmother   . Hypertension Maternal Grandfather   . Hyperlipidemia Maternal Grandfather   . Breast cancer Paternal Aunt        diagnosed in her 7's  . Breast cancer Paternal Grandmother      Prior to Admission medications   Medication Sig Start Date End Date Taking? Authorizing Provider  glucose blood (ONETOUCH VERIO) test strip 1 each by Other route daily. And lancets 1/day 05/12/16   Renato Shin, MD  glucose blood test strip Test blood sugar daily. E11.9 06/02/15   Joretta Bachelor, PA  ONE TOUCH LANCETS MISC TAKE 1 TABLET BY MOUTH EVERY DAY 05/12/16   [provider]    Physical Exam: Vitals:   11/09/18 0600 11/09/18 0727 11/09/18 0730 11/09/18 0737  BP: (!) 137/56 136/66 (!) 135/59   Pulse: (!) 105 (!) 107 (!) 105   Resp:  15 14   Temp:      SpO2: 95% 98% 97%   Weight:    107.5 kg  Height:    5\' 1"  (1.549 m)    Constitutional: NAD, calm, comfortable Vitals:   11/09/18 0600 11/09/18 0727 11/09/18 0730 11/09/18 0737  BP: (!) 137/56 136/66 (!) 135/59   Pulse: (!) 105 (!) 107 (!) 105   Resp:  15 14   Temp:      SpO2: 95% 98% 97%   Weight:    107.5 kg  Height:    5\' 1"  (1.549 m)   Eyes: PERRL, lids and conjunctivae normal ENMT: Mucous membranes are moist. Posterior pharynx clear of any exudate or lesions.Normal dentition.  Neck: normal, supple, no masses, no thyromegaly Respiratory: clear to auscultation bilaterally, no wheezing, no crackles. Normal respiratory effort. No accessory muscle use.  Cardiovascular: Regular rate and rhythm, no murmurs / rubs / gallops. No extremity edema. 2+ pedal pulses. No carotid bruits.  Abdomen: Mild epigastric tenderness, no masses palpated. No hepatosplenomegaly. Bowel sounds positive.  Obese and pendulous. Musculoskeletal: no clubbing / cyanosis. No joint deformity upper and lower extremities. Good ROM, no contractures. Normal muscle tone.  Skin: no rashes, lesions, ulcers. No induration.  Pigmentation both legs. Neurologic: CN 2-12 grossly intact. Sensation intact, DTR normal. Strength 5/5 in all 4.  Psychiatric: Normal judgment and insight. Alert and oriented x 3. Normal mood.     Labs on Admission: I have personally reviewed following labs and imaging studies  CBC: Recent Labs  Lab 11/09/18 0444  WBC 8.6  NEUTROABS 7.5  HGB 13.7  HCT 41.6  MCV 83.7  PLT 099   Basic Metabolic Panel: Recent Labs  Lab 11/09/18 0444  NA 136  K 4.2  CL 102  CO2 23  GLUCOSE 382*  BUN 11  CREATININE 1.14*   CALCIUM 10.3   GFR: Estimated Creatinine Clearance: 62.4 mL/min (A) (by C-G formula based on SCr of 1.14 mg/dL (H)). Liver Function Tests: Recent Labs  Lab 11/09/18 0444  AST 496*  ALT 274*  ALKPHOS 159*  BILITOT 0.9  PROT 7.4  ALBUMIN 3.7   Recent Labs  Lab 11/09/18 0444  LIPASE 27   No results for input(s): AMMONIA in the last 168 hours. Coagulation Profile: No results for input(s): INR, PROTIME in the last 168 hours. Cardiac Enzymes: No results for input(s): CKTOTAL, CKMB, CKMBINDEX, TROPONINI in the last 168 hours. BNP (last 3 results) No results for input(s): PROBNP in the last 8760 hours. HbA1C: No results for input(s): HGBA1C in the last 72  hours. CBG: No results for input(s): GLUCAP in the last 168 hours. Lipid Profile: No results for input(s): CHOL, HDL, LDLCALC, TRIG, CHOLHDL, LDLDIRECT in the last 72 hours. Thyroid Function Tests: No results for input(s): TSH, T4TOTAL, FREET4, T3FREE, THYROIDAB in the last 72 hours. Anemia Panel: No results for input(s): VITAMINB12, FOLATE, FERRITIN, TIBC, IRON, RETICCTPCT in the last 72 hours. Urine analysis:    Component Value Date/Time   COLORURINE YELLOW 11/09/2018 [place] 11/09/2018 0619   LABSPEC 1.023 11/09/2018 0619   PHURINE 5.0 11/09/2018 0619   GLUCOSEU >=500 (A) 11/09/2018 0619   GLUCOSEU 500 (A) 08/10/2016 1617   HGBUR NEGATIVE 11/09/2018 [place] 11/09/2018 0619   BILIRUBINUR N 10/17/2017 1553   KETONESUR NEGATIVE 11/09/2018 0619   PROTEINUR NEGATIVE 11/09/2018 0619   UROBILINOGEN 0.2 10/17/2017 1553   UROBILINOGEN 1.0 08/10/2016 1617   NITRITE NEGATIVE 11/09/2018 0619   LEUKOCYTESUR NEGATIVE 11/09/2018 0619    Radiological Exams on Admission: Ct Abdomen Pelvis W Contrast  Result Date: 11/09/2018 CLINICAL DATA:  Lower abdominal pain EXAM: CT ABDOMEN AND PELVIS WITH CONTRAST TECHNIQUE: Multidetector CT imaging of the abdomen and pelvis was performed using the  standard protocol following bolus administration of intravenous contrast. CONTRAST:  100 cc Isovue-300 intravenous COMPARISON:  10/04/2016 FINDINGS: Lower chest:  No contributory findings. Hepatobiliary: Hepatic steatosis.No evidence of biliary obstruction or stone. Pancreas: Unremarkable. Spleen: Unremarkable. Adrenals/Urinary Tract: Developmental flattening of the left adrenal gland. The left kidney is ptotic. No hydronephrosis. Unremarkable bladder. Stomach/Bowel:  No obstruction. No appendicitis. Vascular/Lymphatic: No acute vascular abnormality. No mass or adenopathy. Reproductive:IUD in expected position. Suspect a anterior intramural fibroid, measurement limited by subtle appearance Other: No ascites or pneumoperitoneum. Musculoskeletal: No acute abnormalities. IMPRESSION: 1. No acute finding. 2. Hepatic steatosis. 3. Left renal ptosis. Electronically Signed   By: Monte Fantasia M.D.   On: 11/09/2018 07:23   US Abdomen Limited  Result Date: 11/09/2018 CLINICAL DATA:  Right upper quadrant pain EXAM: ULTRASOUND ABDOMEN LIMITED RIGHT UPPER QUADRANT COMPARISON:  10/04/2016 FINDINGS: Gallbladder: No gallstones or wall thickening visualized. No sonographic Murphy sign noted by sonographer. Common bile duct: Diameter: 6 mm.  Where visualized, no filling defect. Liver: Echogenic liver with sparing at the gallbladder fossa. Portal vein is patent on color Doppler imaging with normal direction of blood flow towards the liver. IMPRESSION: 1. No acute finding or cholelithiasis. 2. Hepatic steatosis. Electronically Signed   By: Monte Fantasia M.D.   On: 11/09/2018 05:22     Assessment/Plan Principal Problem:   Abdominal pain Active Problems:   OBESITY, MORBID   GERD   Diabetes mellitus without complication (HCC)   Constipation   Abnormal LFTs (liver function tests)     1.  Right upper quadrant abdominal pain/abnormal LFTs with elevated transaminases, no obstructive pattern: Cause unknown.  Could have  acute hepatitis.  No other demonstrable cause for toxic hepatitis. Tylenol level sent, pending.  Will check acute hepatitis panel. Recheck LFTs in the morning, do not believe she will need any further investigation if LFTs start improving. Will start with clear liquid diet and advance as tolerated. Adequate pain medications.  2.  Uncontrolled type 2 diabetes: Was on Jardiance and metformin a  year ago.  She has not taken any medication since then.  Hemoglobin A1c 6.2,1-year ago. Start sliding scale insulin.  Check hemoglobin A1c. Please give her prescriptions on discharge for oral hypoglycemics with instructions to follow-up outpatient.  3.  GERD: On not on treatment.  Advised over-the-counter Pepcid.  4.  Constipation: Mild.  MiraLAX as needed.  DVT prophylaxis: Lovenox Code Status: Full code Family Communication: None Disposition Plan: Home Consults called: None Admission status: Observation   Barb Merino MD [facility] Pager [phone]  If 7PM-7AM, please contact night-coverage [email or web address] Password [email or web address]  11/09/2018, 7:45 AM

## 2018-11-09 NOTE — ED Provider Notes (Signed)
Care assumed from Loraine at shift change, please see her note for full details, but in brief Jill [name] is a 58 y.o. female who presents for evaluation of sudden onset RUQ pain and vomitin.  Right upper quadrant tenderness on exam.  Pain and nausea controlled with morphine and Zofran here in the department.  Labs significant for elevation of AST and ALT as well as alk phos, but normal bilirubin.  Patient without leukocytosis, elevated glucose, but patient is a poorly controlled diabetic.  Urinalysis without signs of infection.  Right upper quadrant ultrasound is unremarkable, CT abdomen pelvis pending.  Tylenol level and hepatitis panel added as well.  Prior care team planned for admission for trending LFTs and potential HIDA, Hep panel and tylenol level ordered, denies alcohol use, CT pending.  Awaiting callback from admitting team.  Patient was in CT when I went in to evaluate the patient myself.  I discussed the case with Dr. Sloan Leiter with [facility] who will see the patient.  Final diagnoses:  Right upper quadrant abdominal pain  Transaminitis  Hyperglycemia     Jacqlyn Larsen, PA-C 11/09/18 0745    Charlesetta Shanks, MD 11/10/18 1112

## 2018-11-09 NOTE — Progress Notes (Signed)
Paged Dr. 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  Waiting for the orders.

## 2018-11-09 NOTE — ED Triage Notes (Signed)
Pt here from home with c/o abd pain , pain started out as twisting but now is just sore , one episode of n/v

## 2018-11-09 NOTE — Progress Notes (Signed)
Infection Prevention  Received call from: Ulice Dash- Patient's nurse Unit:72M Regarding: Patient received text from her supervisor that 2 of her co-workers have tested positive for [place] and she may have been exposed. Patient currently  admitted for Abdominal pain, LFT's elevated, no respiratory symptoms. IP Recommendation: Initiate droplet/contact precautions in light on COVID exposure.Asked nurse to consult with provider. If testing is ordered will need to move to COVID cohorted unit.  Provider may want to  discuss with ID physician on call.

## 2018-11-09 NOTE — ED Notes (Signed)
ED TO INPATIENT HANDOFF REPORT  ED Nurse Name and Phone #: Barbie Banner, RN [phone]  S Name/Age/Gender Jill [name] 58 y.o. female Room/Bed: 021C/021C  Code Status   Code Status: Not on file  Home/SNF/Other Home Patient oriented to: self, place, time and situation Is this baseline? Yes   Triage Complete: Triage complete  Chief Complaint sick/abd pain  Triage Note Pt here from home with c/o abd pain , pain started out as twisting but now is just sore , one episode of n/v   Allergies Allergies  Allergen Reactions  . Adhesive [Tape]     Pulls skin off  . Latex Itching  . Toradol [Ketorolac Tromethamine]     Joint pain and swelling  . Tramadol Other (See Comments)    JOINT PAIN    Level of Care/Admitting Diagnosis ED Disposition    ED Disposition Condition Comment   Admit  Hospital Area: [facility] [[id number]]  Level of Care: Med-Surg [16]  I expect the patient will be discharged within 24 hours: Yes  LOW acuity---Tx typically complete <24 hrs---ACUTE conditions typically can be evaluated <24 hours---LABS likely to return to acceptable levels <24 hours---IS near functional baseline---EXPECTED to return to current living arrangement---NOT newly hypoxic: Meets criteria for 5C-Observation unit  Diagnosis: Abdominal pain [478295]  Admitting Physician: Barb Merino [[id number]]  Attending Physician: Barb Merino [[id number]]  PT Class (Do Not Modify): Observation [104]  PT Acc Code (Do Not Modify): Observation [10022]       B Medical/Surgery History Past Medical History:  Diagnosis Date  . Allergy   . Anemia   . Arthritis   . Asthma    44yrs ago  . Chronic deafness of both ears    left ear has 40% hearing-right ear has 60 % hearing  . Colon polyps   . Complication of anesthesia    Hard to wake up  . Diabetes mellitus without complication ([place])   . Dysmenorrhea   . Migraines    none in last 11 yrs  . Pelvic kidney    left  . Pneumonia    . Post partum depression   . Urinary incontinence    Past Surgical History:  Procedure Laterality Date  . CESAREAN SECTION    . COLONOSCOPY WITH PROPOFOL N/A 03/24/2015   Procedure: COLONOSCOPY WITH PROPOFOL;  Surgeon: Juanita Craver, MD;  [facility];  Service: Endoscopy;  Laterality: N/A;  . HEMORRHOID BANDING  08/2017, 09/2017  . KNEE ARTHROSCOPY WITH MEDIAL MENISECTOMY Left 04/17/2014   Procedure: LEFT KNEE ARTHROSCOPY WITH PARTIAL MEDIAL MENISCECTOMY AND CHONDROPLASTY;  Surgeon: Johnn Hai, MD;  Location[facility];  Service: Orthopedics;  Laterality: Left;  . TYMPANOSTOMY TUBE PLACEMENT Right      A IV Location/Drains/Wounds Patient Lines/Drains/Airways Status   Active Line/Drains/Airways    Name:   Placement date:   Placement time:   Site:   Days:   Peripheral IV 11/09/18 Right Antecubital   11/09/18    0443    Antecubital   less than 1          Intake/Output Last 24 hours No intake or output data in the 24 hours ending 11/09/18 2952  Labs/Imaging Results for orders placed or performed during the hospital encounter of 11/09/18 (from the past 48 hour(s))  CBC with Differential     Status: None   Collection Time: 11/09/18  4:44 AM  Result Value Ref Range   WBC 8.6 4.0 - 10.5 K/uL   RBC 4.97  3.87 - 5.11 MIL/uL   Hemoglobin 13.7 12.0 - 15.0 g/dL   HCT 41.6 36.0 - 46.0 %   MCV 83.7 80.0 - 100.0 fL   MCH 27.6 26.0 - 34.0 pg   MCHC 32.9 30.0 - 36.0 g/dL   RDW 13.7 11.5 - 15.5 %   Platelets 176 150 - 400 K/uL   nRBC 0.0 0.0 - 0.2 %   Neutrophils Relative % 87 %   Neutro Abs 7.5 1.7 - 7.7 K/uL   Lymphocytes Relative 8 %   Lymphs Abs 0.7 0.7 - 4.0 K/uL   Monocytes Relative 4 %   Monocytes Absolute 0.4 0.1 - 1.0 K/uL   Eosinophils Relative 1 %   Eosinophils Absolute 0.0 0.0 - 0.5 K/uL   Basophils Relative 0 %   Basophils Absolute 0.0 0.0 - 0.1 K/uL   Immature Granulocytes 0 %   Abs Immature Granulocytes 0.03 0.00 - 0.07 K/uL    Comment: Performed at [facility] [address] [place], [place] [address]  Comprehensive metabolic panel     Status: Abnormal   Collection Time: 11/09/18  4:44 AM  Result Value Ref Range   Sodium 136 135 - 145 mmol/L   Potassium 4.2 3.5 - 5.1 mmol/L   Chloride 102 98 - 111 mmol/L   CO2 23 22 - 32 mmol/L   Glucose, Bld 382 (H) 70 - 99 mg/dL   BUN 11 6 - 20 mg/dL   Creatinine, Ser 1.14 (H) 0.44 - 1.00 mg/dL   Calcium 10.3 8.9 - 10.3 mg/dL   Total Protein 7.4 6.5 - 8.1 g/dL   Albumin 3.7 3.5 - 5.0 g/dL   AST 496 (H) 15 - 41 U/L   ALT 274 (H) 0 - 44 U/L   Alkaline Phosphatase 159 (H) 38 - 126 U/L   Total Bilirubin 0.9 0.3 - 1.2 mg/dL   GFR calc non Af Amer 54 (L) >60 mL/min   GFR calc Af Amer >60 >60 mL/min   Anion gap 11 5 - 15    Comment: Performed at [facility] [address] [place], [place] [address]  Lipase, blood     Status: None   Collection Time: 11/09/18  4:44 AM  Result Value Ref Range   Lipase 27 11 - 51 U/L    Comment: Performed at [facility] [address] [place], [place] [address]  Urinalysis, Routine w reflex microscopic     Status: Abnormal   Collection Time: 11/09/18  6:19 AM  Result Value Ref Range   Color, Urine YELLOW YELLOW   APPearance CLEAR CLEAR   Specific Gravity, Urine 1.023 1.005 - 1.030   pH 5.0 5.0 - 8.0   Glucose, UA >=500 (A) NEGATIVE mg/dL   Hgb urine dipstick NEGATIVE NEGATIVE   Bilirubin Urine NEGATIVE NEGATIVE   Ketones, ur NEGATIVE NEGATIVE mg/dL   Protein, ur NEGATIVE NEGATIVE mg/dL   Nitrite NEGATIVE NEGATIVE   Leukocytes,Ua NEGATIVE NEGATIVE   RBC / HPF 0-5 0 - 5 RBC/hpf   WBC, UA 0-5 0 - 5 WBC/hpf   Bacteria, UA NONE SEEN NONE SEEN   Squamous Epithelial / LPF 0-5 0 - 5   Mucus PRESENT     Comment: Performed at [facility], [place] [address], [place],  [address]  Acetaminophen level     Status: Abnormal   Collection Time: 11/09/18  6:46 AM  Result Value Ref Range   Acetaminophen (Tylenol), Serum <10 (L)  10 - 30 ug/mL     Comment: (NOTE) Therapeutic concentrations vary significantly. A range of 10-30 ug/mL  may be an effective concentration for many patients. However, some  are best treated at concentrations outside of this range. Acetaminophen concentrations >150 ug/mL at 4 hours after ingestion  and >50 ug/mL at 12 hours after ingestion are often associated with  toxic reactions. Performed at [facility], [place] [address] [place], [place] [address]   CBG monitoring, ED     Status: Abnormal   Collection Time: 11/09/18  8:11 AM  Result Value Ref Range   Glucose-Capillary 379 (H) 70 - 99 mg/dL   Ct Abdomen Pelvis W Contrast  Result Date: 11/09/2018 CLINICAL DATA:  Lower abdominal pain EXAM: CT ABDOMEN AND PELVIS WITH CONTRAST TECHNIQUE: Multidetector CT imaging of the abdomen and pelvis was performed using the standard protocol following bolus administration of intravenous contrast. CONTRAST:  100 cc Isovue-300 intravenous COMPARISON:  10/04/2016 FINDINGS: Lower chest:  No contributory findings. Hepatobiliary: Hepatic steatosis.No evidence of biliary obstruction or stone. Pancreas: Unremarkable. Spleen: Unremarkable. Adrenals/Urinary Tract: Developmental flattening of the left adrenal gland. The left kidney is ptotic. No hydronephrosis. Unremarkable bladder. Stomach/Bowel:  No obstruction. No appendicitis. Vascular/Lymphatic: No acute vascular abnormality. No mass or adenopathy. Reproductive:IUD in expected position. Suspect a anterior intramural fibroid, measurement limited by subtle appearance Other: No ascites or pneumoperitoneum. Musculoskeletal: No acute abnormalities. IMPRESSION: 1. No acute finding. 2. Hepatic steatosis. 3. Left renal ptosis. Electronically Signed   By: Monte Fantasia M.D.   On: 11/09/2018 07:23   US Abdomen Limited  Result Date: 11/09/2018 CLINICAL DATA:  Right upper quadrant pain EXAM: ULTRASOUND ABDOMEN LIMITED RIGHT UPPER QUADRANT COMPARISON:  10/04/2016 FINDINGS: Gallbladder:  No gallstones or wall thickening visualized. No sonographic Murphy sign noted by sonographer. Common bile duct: Diameter: 6 mm.  Where visualized, no filling defect. Liver: Echogenic liver with sparing at the gallbladder fossa. Portal vein is patent on color Doppler imaging with normal direction of blood flow towards the liver. IMPRESSION: 1. No acute finding or cholelithiasis. 2. Hepatic steatosis. Electronically Signed   By: Monte Fantasia M.D.   On: 11/09/2018 05:22    Pending Labs Unresulted Labs (From admission, onward)    Start     Ordered   11/09/18 0618  Hepatitis panel, acute  ONCE - STAT,   STAT     11/09/18 0617   Signed and Held  HIV antibody (Routine Testing)  Once,   R     Signed and Held   Signed and Held  Hemoglobin A1c  Once,   R     Signed and Held   Signed and Held  Comprehensive metabolic panel  Tomorrow morning,   R     Signed and Held          Vitals/Pain Today's Vitals   11/09/18 0727 11/09/18 0730 11/09/18 0732 11/09/18 0737  BP: 136/66 (!) 135/59    Pulse: (!) 107 (!) 105    Resp: 15 14    Temp:      SpO2: 98% 97%    Weight:    107.5 kg  Height:    5\' 1"  (1.549 m)  PainSc:   3      Isolation Precautions No active isolations  Medications Medications  insulin aspart (novoLOG) injection 0-9 Units (9 Units Subcutaneous Given 11/09/18 0819)  insulin aspart (novoLOG) injection 0-5 Units (has no administration in time range)  sodium chloride 0.9 % bolus 1,000 mL (0 mLs  Intravenous Stopped 11/09/18 0605)  morphine 4 MG/ML injection 4 mg (4 mg Intravenous Given 11/09/18 0451)  ondansetron (ZOFRAN) injection 4 mg (4 mg Intravenous Given 11/09/18 0451)  iopamidol (ISOVUE-300) 61 % injection 100 mL (100 mLs Intravenous Contrast Given 11/09/18 0713)    Mobility walks Low fall risk   Focused Assessments Renal Assessment Handoff:  Hemodialysis Schedule:  Last Hemodialysis date and time:    Restricted appendage:      R  Recommendations: See Admitting  Provider Note  Report give to: 5M12C  Additional Notes: Pt is very pleasant, A/Ox4, came for abd pain in URQ (& vomitting x1 before arrival), no c/o nausea at this time & her last stool was loose & runny this morning (per pt). No other complaints at present Pt understands why EDP told her she will be observed for the next 24hr which is d/t her liver enzymes. Pt tolerates water now that she is on a clear liq diet. Last CBG is 378 at 0812 & received 9 Units novolog.

## 2018-11-09 NOTE — ED Notes (Signed)
EDP at bedside  

## 2018-11-09 NOTE — Progress Notes (Signed)
Received patient from ED. Patient alert and oriented x4. No complaints of pain at this time. Patient oriented to bed controls and call bell. Will continue to monitor.

## 2018-11-10 DIAGNOSIS — R739 Hyperglycemia, unspecified: Secondary | ICD-10-CM | POA: Diagnosis not present

## 2018-11-10 DIAGNOSIS — R1010 Upper abdominal pain, unspecified: Secondary | ICD-10-CM | POA: Diagnosis not present

## 2018-11-10 DIAGNOSIS — K5900 Constipation, unspecified: Secondary | ICD-10-CM | POA: Diagnosis not present

## 2018-11-10 LAB — COMPREHENSIVE METABOLIC PANEL
ALT: 353 U/L — ABNORMAL HIGH (ref 0–44)
AST: 147 U/L — ABNORMAL HIGH (ref 15–41)
Albumin: 3.1 g/dL — ABNORMAL LOW (ref 3.5–5.0)
Alkaline Phosphatase: 116 U/L (ref 38–126)
Anion gap: 7 (ref 5–15)
BUN: 6 mg/dL (ref 6–20)
CO2: 24 mmol/L (ref 22–32)
Calcium: 9.5 mg/dL (ref 8.9–10.3)
Chloride: 105 mmol/L (ref 98–111)
Creatinine, Ser: 0.89 mg/dL (ref 0.44–1.00)
GFR calc Af Amer: >60 mL/min (ref >60)
GFR calc non Af Amer: >60 mL/min (ref >60)
Glucose, Bld: 244 mg/dL — ABNORMAL HIGH (ref 70–99)
Potassium: 3.7 mmol/L (ref 3.5–5.1)
Sodium: 136 mmol/L (ref 135–145)
Total Bilirubin: 0.7 mg/dL (ref 0.3–1.2)
Total Protein: 6.5 g/dL (ref 6.5–8.1)

## 2018-11-10 LAB — GLUCOSE, CAPILLARY
Glucose-Capillary: 208 mg/dL — ABNORMAL HIGH (ref 70–99)
Glucose-Capillary: 244 mg/dL — ABNORMAL HIGH (ref 70–99)

## 2018-11-10 LAB — HIV ANTIBODY (ROUTINE TESTING W REFLEX): HIV Screen 4th Generation wRfx: NONREACTIVE

## 2018-11-10 LAB — HEPATITIS PANEL, ACUTE
HCV Ab: <0.1 s/co ratio (ref 0.0–0.9)
Hep A IgM: NEGATIVE
Hep B C IgM: NEGATIVE
Hepatitis B Surface Ag: NEGATIVE

## 2018-11-10 NOTE — Discharge Summary (Signed)
Physician Discharge Summary  Jill [name] VFI:[id number] DOB: 08-May-1961 DOA: 11/09/2018  PCP: Binnie Rail, MD  Admit date: 11/09/2018 Discharge date: 11/10/2018  Admitted From: home Discharge disposition: home   Recommendations for Outpatient Follow-Up:   1. cmp 1 week 2. Discuss medications for diabetic control-- HgbA1c pending at d/c   Discharge Diagnosis:   Principal Problem:   Abdominal pain Active Problems:   OBESITY, MORBID   GERD   Diabetes mellitus without complication (HCC)   Constipation   Abnormal LFTs (liver function tests)    Discharge Condition: Improved.  Diet recommendation:  Carbohydrate-modified  Wound care: None.  Code status: Full.   History of Present Illness:   Jill [name] is a 58 y.o. female with medical history significant of type 2 diabetes, last known A1c 6.21-year ago, currently not on any treatment due to noncompliance, obesity and mild intermittent asthma presenting to the emergency room with sudden onset of abdomen pain.  Started last night after dinner, epigastric and right upper quadrant, crampy, moderate in intensity, initially radiated to the right flank, improved somehow in the emergency room after morphine.  No associated nausea or vomiting.  She has chronic constipation and occasional takes over-the-counter MiraLAX.  No fever or chills.  No flulike symptoms.  No chest pain or shortness of breath. No over-the-counter medication use.  No Tylenol use.  Does not drink alcohol.  No recent injury or blood transfusion.  Not sexually active currently   Hospital Course by Problem:   Right upper quadrant abdominal pain/abnormal LFTs with elevated transaminases, no obstructive pattern-- found to have hepatic steatosis -LFTs trending down--do not believe she will need any further investigation since LFTs improving -d/c home with outpatient follow up   type 2 diabetes:  -encourage diet and follow up with PCP -weight loss  and exercise encouraged -HgbA1c pending   GERD: -OTC treatment   Constipation: Mild. --bowel regimen  Obesity Estimated body mass index is 44.57 kg/m as calculated from the following:   Height as of this encounter: 5\' 1"  (1.549 m).   Weight as of this encounter: 107 kg.  No reported COVId 19 positive contacts-- patient will monitor for symptoms and report to PCP if having symptoms  Medical Consultants:      Discharge Exam:   Vitals:   11/10/18 0408 11/10/18 0935  BP: (!) 146/85 (!) 142/83  Pulse: 73 70  Resp: 20 18  Temp: 98.3 F (36.8 C) 98 F (36.7 C)  SpO2: 98% 98%   Vitals:   11/09/18 1854 11/09/18 2111 11/10/18 0408 11/10/18 0935  BP: 132/64 (!) 151/74 (!) 146/85 (!) 142/83  Pulse: 70 75 73 70  Resp: 20 20 20 18   Temp: 98.4 F (36.9 C) 98.5 F (36.9 C) 98.3 F (36.8 C) 98 F (36.7 C)  TempSrc: Oral Oral Oral Oral  SpO2: 100% 100% 98% 98%  Weight:   107 kg   Height:        General exam: Appears calm and comfortable--- feeling much better.    The results of significant diagnostics from this hospitalization (including imaging, microbiology, ancillary and laboratory) are listed below for reference.     Procedures and Diagnostic Studies:   Ct Abdomen Pelvis W Contrast  Result Date: 11/09/2018 CLINICAL DATA:  Lower abdominal pain EXAM: CT ABDOMEN AND PELVIS WITH CONTRAST TECHNIQUE: Multidetector CT imaging of the abdomen and pelvis was performed using the standard protocol following bolus administration of intravenous contrast. CONTRAST:  100 cc  Isovue-300 intravenous COMPARISON:  10/04/2016 FINDINGS: Lower chest:  No contributory findings. Hepatobiliary: Hepatic steatosis.No evidence of biliary obstruction or stone. Pancreas: Unremarkable. Spleen: Unremarkable. Adrenals/Urinary Tract: Developmental flattening of the left adrenal gland. The left kidney is ptotic. No hydronephrosis. Unremarkable bladder. Stomach/Bowel:  No obstruction. No appendicitis.  Vascular/Lymphatic: No acute vascular abnormality. No mass or adenopathy. Reproductive:IUD in expected position. Suspect a anterior intramural fibroid, measurement limited by subtle appearance Other: No ascites or pneumoperitoneum. Musculoskeletal: No acute abnormalities. IMPRESSION: 1. No acute finding. 2. Hepatic steatosis. 3. Left renal ptosis. Electronically Signed   By: Monte Fantasia M.D.   On: 11/09/2018 07:23   US Abdomen Limited  Result Date: 11/09/2018 CLINICAL DATA:  Right upper quadrant pain EXAM: ULTRASOUND ABDOMEN LIMITED RIGHT UPPER QUADRANT COMPARISON:  10/04/2016 FINDINGS: Gallbladder: No gallstones or wall thickening visualized. No sonographic Murphy sign noted by sonographer. Common bile duct: Diameter: 6 mm.  Where visualized, no filling defect. Liver: Echogenic liver with sparing at the gallbladder fossa. Portal vein is patent on color Doppler imaging with normal direction of blood flow towards the liver. IMPRESSION: 1. No acute finding or cholelithiasis. 2. Hepatic steatosis. Electronically Signed   By: Monte Fantasia M.D.   On: 11/09/2018 05:22     Labs:   Basic Metabolic Panel: Recent Labs  Lab 11/09/18 0444 11/10/18 0354  NA 136 136  K 4.2 3.7  CL 102 105  CO2 23 24  GLUCOSE 382* 244*  BUN 11 6  CREATININE 1.14* 0.89  CALCIUM 10.3 9.5   GFR Estimated Creatinine Clearance: 79.7 mL/min (by C-G formula based on SCr of 0.89 mg/dL). Liver Function Tests: Recent Labs  Lab 11/09/18 0444 11/10/18 0354  AST 496* 147*  ALT 274* 353*  ALKPHOS 159* 116  BILITOT 0.9 0.7  PROT 7.4 6.5  ALBUMIN 3.7 3.1*   Recent Labs  Lab 11/09/18 0444  LIPASE 27   No results for input(s): AMMONIA in the last 168 hours. Coagulation profile No results for input(s): INR, PROTIME in the last 168 hours.  CBC: Recent Labs  Lab 11/09/18 0444  WBC 8.6  NEUTROABS 7.5  HGB 13.7  HCT 41.6  MCV 83.7  PLT 176   Cardiac Enzymes: No results for input(s): CKTOTAL, CKMB,  CKMBINDEX, TROPONINI in the last 168 hours. BNP: Invalid input(s): POCBNP CBG: Recent Labs  Lab 11/09/18 0811 11/09/18 1122 11/09/18 1611 11/10/18 0657  GLUCAP 379* 267* 175* 208*   D-Dimer No results for input(s): DDIMER in the last 72 hours. Hgb A1c Recent Labs    11/09/18 0940  HGBA1C 10.8*   Lipid Profile No results for input(s): CHOL, HDL, LDLCALC, TRIG, CHOLHDL, LDLDIRECT in the last 72 hours. Thyroid function studies No results for input(s): TSH, T4TOTAL, T3FREE, THYROIDAB in the last 72 hours.  Invalid input(s): FREET3 Anemia work up No results for input(s): VITAMINB12, FOLATE, FERRITIN, TIBC, IRON, RETICCTPCT in the last 72 hours. Microbiology No results found for this or any previous visit (from the past 240 hour(s)).   Discharge Instructions:   Discharge Instructions    Diet Carb Modified   Complete by:  As directed    Discharge instructions   Complete by:  As directed    Bowel regimen to ensure "toothpaste" consistency stools -can use fiber gummies, senna   Increase activity slowly   Complete by:  As directed      Allergies as of 11/10/2018      Reactions   Adhesive [tape]    Pulls skin off  Latex Itching   Toradol [ketorolac Tromethamine]    Joint pain and swelling   Tramadol Other (See Comments)   JOINT PAIN      Medication List    TAKE these medications   glucose blood test strip Test blood sugar daily. E11.9   glucose blood test strip Commonly known as:  OneTouch Verio 1 each by Other route daily. And lancets 1/day   ONE TOUCH LANCETS Misc TAKE 1 TABLET BY MOUTH EVERY DAY      Follow-up Information    Binnie Rail, MD Follow up in 2 week(s).   Specialty:  Internal Medicine Why:  CMP to follow up on your liver enzymes Contact information: [address] [place] [address] [phone]            Time coordinating discharge: 25 min  Signed:  Geradine Girt DO  [facility] 11/10/2018, 11:10 AM

## 2018-11-10 NOTE — Progress Notes (Signed)
Patient discharged to home, AVS reviewed and all questions answered. No new medications. IV removed, no tele. Patient left floor via wheelchair with staff member.

## 2018-11-12 ENCOUNTER — Telehealth: Payer: Self-pay | Admitting: Internal Medicine

## 2018-11-12 MED ORDER — GLUCOSE BLOOD VI STRP: 1.0000 | ORAL_STRIP | Freq: Every day | 12 refills | Status: DC

## 2018-11-12 NOTE — Telephone Encounter (Signed)
Patient needed a hospital fu in 2 weeks. Virtual visit has been made for 4/17. Patient also states all of her medications need refills.

## 2018-11-12 NOTE — Telephone Encounter (Signed)
Copied from [place]. Topic: Appointment Scheduling - Scheduling Inquiry for Clinic >> Nov 12, 2018  9:16 AM Blase Mess A wrote: Reason for CRM: Attempted to reach the practice 3x please follow up with patient directly regarding scheduling hospital follow up. Patient was discharged from the hospital on 11/10/18. Patient also needs a medication refills. But does not know the medication that she is in need of. She felt better with the insulin that she was given in the hosptial than the metformin. She has been off of the metformin for a month. Please advise. Thank you. [phone] (H)

## 2018-11-12 NOTE — Telephone Encounter (Addendum)
Only medication type on pt med list was test strips. Erx has been sent.   Called and spoke to pt. Pt stated that the hospital was giving her insulin and pt stated that used to take metformin. Informed pt that she needs to have her Hosp fu with PCP first. Pt stated understanding.

## 2018-11-22 NOTE — Progress Notes (Signed)
Virtual Visit via Video Note  I connected with Jill [name] on 11/22/18 at 10:00 AM EDT by a video enabled telemedicine application and verified that I am speaking with the correct person using two identifiers.   I discussed the limitations of evaluation and management by telemedicine and the availability of in person appointments. The patient expressed understanding and agreed to proceed.  The patient is currently at home and I am in the office.    No referring provider.    History of Present Illness: This visit is for hospital follow up.  She went to the ED 11/09/18 for abdominal pain x 1 day.  The pain was sudden that woke her up around 2:00 in the morning.  Looking back she wonders if it was related to eating too much red meat, which she typically does not do.  The pain was located in the RUQ  and epigastric region and radiated to the right flank.  It was crampy pain that was moderate in nature.  She had a low grade fever that night.  She denied nausea, vomiting, change in bowels, chest pain or SOB.  She does not drink alcohol and denied otc medication use or tylenol.    ED:  VSS.  Glucose > 300.  Elevated LFTs.  CBC normal.  RUQ [place] normal.  CT Ab/Pel showed fatty liver.  Pain improved with morphine. She was admitted for observation.  RUQ pain, elevated LFTs: Cause unknown.   Acute hepatitis panel negative.  No other cause for toxic hepatitis.  Tylenol level neg.   lfts improved, but were still elevated Started on clear liquids and advanced Received pain meds She states she still has a sensation in her upper abdomen-kind of like a burning sensation, but not GERD-like.  This typically comes a couple of hours before she is going to eat-almost like she is hungry.  She denies any other pain similar to what she had when she went to the emergency room  Uncontrolled DM Was on jardiance, metformin but has not followed up and has not been on any medications No medications for one year a1c  10.8, was 6.2 % one year ago  In hospital was on SS insulin She does not have a glucometer to check her sugars She was not given any medication upon discharge She states she has not been compliant with a diabetic diet-because of financial reasons has been eating more carbohydrates for the past few months She is not exercising regularly, but think she can start doing some exercise now that it is getting nicer out and lighter longer  GERD: pepcid  Chronic constipation Was taking MiraLAX daily, but this was not making her very regular We will try fiber Gummies   Review of Systems  Constitutional: Negative for chills and fever.  HENT:       PND from allergies  Eyes: Negative for blurred vision.  Respiratory: Negative for cough, shortness of breath and wheezing.   Cardiovascular: Negative for chest pain, palpitations and leg swelling.  Gastrointestinal: Positive for constipation (miralax). Negative for abdominal pain, diarrhea, heartburn and nausea.       Sensation in upper abdomen before eating - like a burn or emptiness feeling  Genitourinary:       Increased urination and thirst  Neurological: Negative for dizziness and headaches.      Social History   Socioeconomic History  . Marital status: Single    Spouse name: Not on file  . Number of children: 1  .  Years of education: college   . Highest education level: Associate degree: academic program  Occupational History  . Occupation: social services case Secretary/administrator: [employer]  . Financial resource strain: Not very hard  . Food insecurity:    Worry: Patient refused    Inability: Patient refused  . Transportation needs:    Medical: Patient refused    Non-medical: Patient refused  Tobacco Use  . Smoking status: Never Smoker  . Smokeless tobacco: Never Used  Substance and Sexual Activity  . Alcohol use: Yes    Alcohol/week: 0.0 standard drinks    Comment: rarely  . Drug use: No  . Sexual  activity: Never    Partners: Male    Birth control/protection: Abstinence  Lifestyle  . Physical activity:    Days per week: Patient refused    Minutes per session: Patient refused  . Stress: Patient refused  Relationships  . Social connections:    Talks on phone: Patient refused    Gets together: Patient refused    Attends religious service: Patient refused    Active member of club or organization: Patient refused    Attends meetings of clubs or organizations: Patient refused    Relationship status: Patient refused  Other Topics Concern  . Not on file  Social History Narrative   Single. Education: [employer].      Observations/Objective: Appears well in NAD Breathing normally Normal mood and affect  Assessment and Plan:  See Problem List for Assessment and Plan of chronic medical problems.   Follow Up Instructions:  Advised for her to follow-up in the office once the coronavirus situation is resolved-no later than 3 months   I discussed the assessment and treatment plan with the patient. The patient was provided an opportunity to ask questions and all were answered. The patient agreed with the plan and demonstrated an understanding of the instructions.   The patient was advised to call back or seek an in-person evaluation if the symptoms worsen or if the condition fails to improve as anticipated.    Binnie Rail, MD

## 2018-11-23 ENCOUNTER — Ambulatory Visit (INDEPENDENT_AMBULATORY_CARE_PROVIDER_SITE_OTHER): Payer: 59 | Admitting: Internal Medicine

## 2018-11-23 ENCOUNTER — Encounter: Payer: Self-pay | Admitting: Internal Medicine

## 2018-11-23 DIAGNOSIS — R1010 Upper abdominal pain, unspecified: Secondary | ICD-10-CM

## 2018-11-23 DIAGNOSIS — R945 Abnormal results of liver function studies: Secondary | ICD-10-CM

## 2018-11-23 DIAGNOSIS — K219 Gastro-esophageal reflux disease without esophagitis: Secondary | ICD-10-CM | POA: Diagnosis not present

## 2018-11-23 DIAGNOSIS — R7989 Other specified abnormal findings of blood chemistry: Secondary | ICD-10-CM

## 2018-11-23 DIAGNOSIS — E1165 Type 2 diabetes mellitus with hyperglycemia: Secondary | ICD-10-CM | POA: Diagnosis not present

## 2018-11-23 DIAGNOSIS — K5900 Constipation, unspecified: Secondary | ICD-10-CM

## 2018-11-23 MED ORDER — LINAGLIPTIN 5 MG PO TABS: 5.0000 mg | ORAL_TABLET | Freq: Every day | ORAL | 2 refills | Status: DC

## 2018-11-23 MED ORDER — DEXCOM G6 RECEIVER DEVI: 1.0000 | Freq: Every day | 0 refills | Status: DC

## 2018-11-23 MED ORDER — SEMAGLUTIDE(0.25 OR 0.5MG/DOS) 2 MG/1.5ML ~~LOC~~ SOPN: PEN_INJECTOR | SUBCUTANEOUS | 2 refills | Status: DC

## 2018-11-23 MED ORDER — DEXCOM G6 SENSOR MISC: 1.0000 | 5 refills | Status: DC

## 2018-11-23 NOTE — Assessment & Plan Note (Signed)
Chronic MiraLAX works, but not as well as she would like Will try fiber Gummies

## 2018-11-23 NOTE — Assessment & Plan Note (Signed)
As above we will recheck LFTs to make sure they are returning to normal CMP next week  So far hepatitis panel, HIV, Tylenol level negative.  She does not drink any alcohol.  Ultrasound and CT scan did not reveal cause.  No obvious cause for elevation in abdominal pain

## 2018-11-23 NOTE — Assessment & Plan Note (Signed)
Uncontrolled diabetes She has been off medication for several months Discussed lifestyle changes-increasing exercise, weight loss and better compliance with a diabetic diet She was on Tradjenta, Jardiance and metformin in the past-cost is an issue and she had to use a savings card.  Metformin was difficult for her to swallow because of the size of the pill and she wonders if she would be better with insulin.  Restart Tradjenta if covered Trial of Ozempic weekly-discussed possible side effects If these are not effective we will go to insulin Advised that once the coronavirus situation has resolved she needs to come in so that we can follow-up on her sugars She requested Dexcom monitoring system-if this is not covered will consider freestyle monitoring system  Plan to follow-up in 3 months or sooner

## 2018-11-23 NOTE — Assessment & Plan Note (Signed)
Severe abdominal pain has resolved Hepatitis panel negative, HIV negative, Tylenol level negative No evidence of gallstones on ultrasound or CT scan Cause of abdominal pain unknown Pain has resolved-she still has a little bit of a nagging/burning sensation in her stomach a couple of hours before she is due to eat LFTs were elevated and we will repeat that-she will come get blood work next week Monitor for recurrent symptoms-discussed this could have been a passed gallstone and she will watch what she is eating If LFTs still elevated may need further evaluation

## 2018-11-23 NOTE — Assessment & Plan Note (Signed)
Controlled with Pepcid Continue

## 2018-11-27 ENCOUNTER — Telehealth: Payer: Self-pay | Admitting: Internal Medicine

## 2018-11-27 DIAGNOSIS — E1165 Type 2 diabetes mellitus with hyperglycemia: Secondary | ICD-10-CM

## 2018-11-27 MED ORDER — DEXCOM G6 SENSOR MISC: 1.0000 | 5 refills | Status: DC

## 2018-11-27 NOTE — Telephone Encounter (Signed)
Patient called because she has everything she needs for her Dexcom G6 except the transmitter. She would like this sent to  [id number] - [place], [place] - [place] [address] [place] [address] Phone: [phone] Fax: [phone]

## 2018-11-27 NOTE — Telephone Encounter (Signed)
Rx sent 

## 2018-11-27 NOTE — Addendum Note (Signed)
Addended by: Delice Bison E on: 11/27/2018 03:49 PM   Modules accepted: Orders

## 2018-11-28 ENCOUNTER — Encounter: Payer: Self-pay | Admitting: Internal Medicine

## 2018-11-28 ENCOUNTER — Other Ambulatory Visit (INDEPENDENT_AMBULATORY_CARE_PROVIDER_SITE_OTHER): Payer: 59

## 2018-11-28 DIAGNOSIS — R945 Abnormal results of liver function studies: Secondary | ICD-10-CM

## 2018-11-28 DIAGNOSIS — R7989 Other specified abnormal findings of blood chemistry: Secondary | ICD-10-CM

## 2018-11-28 LAB — COMPREHENSIVE METABOLIC PANEL
ALT: 22 U/L (ref 0–35)
AST: 13 U/L (ref 0–37)
Albumin: 3.9 g/dL (ref 3.5–5.2)
Alkaline Phosphatase: 102 U/L (ref 39–117)
BUN: 10 mg/dL (ref 6–23)
CO2: 27 mEq/L (ref 19–32)
Calcium: 10.3 mg/dL (ref 8.4–10.5)
Chloride: 103 mEq/L (ref 96–112)
Creatinine, Ser: 0.92 mg/dL (ref 0.40–1.20)
GFR: 76.23 mL/min (ref >60.00)
Glucose, Bld: 266 mg/dL — ABNORMAL HIGH (ref 70–99)
Potassium: 4.6 mEq/L (ref 3.5–5.1)
Sodium: 136 mEq/L (ref 135–145)
Total Bilirubin: 0.6 mg/dL (ref 0.2–1.2)
Total Protein: 7.5 g/dL (ref 6.0–8.3)

## 2018-11-29 MED ORDER — DEXCOM G6 TRANSMITTER MISC: 1.0000 | Freq: Every day | 0 refills | Status: DC

## 2018-12-12 ENCOUNTER — Encounter: Payer: Self-pay | Admitting: Internal Medicine

## 2018-12-12 MED ORDER — LUBIPROSTONE 24 MCG PO CAPS: 24.0000 ug | ORAL_CAPSULE | Freq: Two times a day (BID) | ORAL | 5 refills | Status: DC

## 2018-12-12 MED ORDER — HYDROCORTISONE (PERIANAL) 2.5 % EX CREA: 1.0000 "application " | TOPICAL_CREAM | Freq: Two times a day (BID) | CUTANEOUS | 0 refills | Status: DC

## 2018-12-12 NOTE — Telephone Encounter (Signed)
Ok to write note.   I sent in two prescriptions - a cream for the hemorrhoids and amitiza which is a pill she needs to take twice a day for constipation.   She can continue the fiber gummies and should drink a good amount of water.

## 2018-12-12 NOTE — Telephone Encounter (Signed)
Pt aware of medications being sent in. Letter written and faxed per patients request to [phone] for her friend Arliss Journey to take to supervisor.

## 2019-02-12 ENCOUNTER — Other Ambulatory Visit: Payer: Self-pay | Admitting: Internal Medicine

## 2019-02-12 ENCOUNTER — Encounter: Payer: Self-pay | Admitting: Internal Medicine

## 2019-02-12 DIAGNOSIS — Z1231 Encounter for screening mammogram for malignant neoplasm of breast: Secondary | ICD-10-CM

## 2019-02-12 DIAGNOSIS — E1165 Type 2 diabetes mellitus with hyperglycemia: Secondary | ICD-10-CM

## 2019-02-13 MED ORDER — OZEMPIC (1 MG/DOSE) 2 MG/1.5ML ~~LOC~~ SOPN: 1.0000 mg | PEN_INJECTOR | SUBCUTANEOUS | 1 refills | Status: DC

## 2019-02-13 NOTE — Addendum Note (Signed)
Addended by: Binnie Rail on: 02/13/2019 09:31 PM   Modules accepted: Orders

## 2019-02-20 ENCOUNTER — Other Ambulatory Visit (INDEPENDENT_AMBULATORY_CARE_PROVIDER_SITE_OTHER): Payer: 59

## 2019-02-20 ENCOUNTER — Other Ambulatory Visit: Payer: Self-pay | Admitting: Internal Medicine

## 2019-02-20 DIAGNOSIS — E1165 Type 2 diabetes mellitus with hyperglycemia: Secondary | ICD-10-CM | POA: Diagnosis not present

## 2019-02-20 LAB — COMPREHENSIVE METABOLIC PANEL
ALT: 15 U/L (ref 0–35)
AST: 11 U/L (ref 0–37)
Albumin: 4.1 g/dL (ref 3.5–5.2)
Alkaline Phosphatase: 82 U/L (ref 39–117)
BUN: 18 mg/dL (ref 6–23)
CO2: 25 mEq/L (ref 19–32)
Calcium: 10.4 mg/dL (ref 8.4–10.5)
Chloride: 104 mEq/L (ref 96–112)
Creatinine, Ser: 1.06 mg/dL (ref 0.40–1.20)
GFR: 64.68 mL/min (ref >60.00)
Glucose, Bld: 146 mg/dL — ABNORMAL HIGH (ref 70–99)
Potassium: 4.7 mEq/L (ref 3.5–5.1)
Sodium: 135 mEq/L (ref 135–145)
Total Bilirubin: 0.5 mg/dL (ref 0.2–1.2)
Total Protein: 7.8 g/dL (ref 6.0–8.3)

## 2019-02-20 LAB — LIPID PANEL
Cholesterol: 181 mg/dL (ref 0–200)
HDL: 35.7 mg/dL — ABNORMAL LOW (ref >39.00)
LDL Cholesterol: 132 mg/dL — ABNORMAL HIGH (ref 0–99)
NonHDL: 144.94
Total CHOL/HDL Ratio: 5
Triglycerides: 66 mg/dL (ref 0.0–149.0)
VLDL: 13.2 mg/dL (ref 0.0–40.0)

## 2019-02-20 LAB — HEMOGLOBIN A1C: Hgb A1c MFr Bld: 8.2 % — ABNORMAL HIGH (ref 4.6–6.5)

## 2019-02-21 ENCOUNTER — Other Ambulatory Visit: Payer: Self-pay | Admitting: Internal Medicine

## 2019-02-21 ENCOUNTER — Encounter: Payer: Self-pay | Admitting: Internal Medicine

## 2019-03-08 ENCOUNTER — Other Ambulatory Visit: Payer: Self-pay

## 2019-03-11 ENCOUNTER — Encounter: Payer: Self-pay | Admitting: Internal Medicine

## 2019-03-12 ENCOUNTER — Ambulatory Visit: Payer: 59 | Admitting: Obstetrics & Gynecology

## 2019-03-12 ENCOUNTER — Other Ambulatory Visit: Payer: Self-pay

## 2019-03-12 ENCOUNTER — Encounter: Payer: Self-pay | Admitting: Obstetrics & Gynecology

## 2019-03-12 VITALS — BP 124/74 | HR 72 | Temp 97.4°F | Ht 62.5 in | Wt 251.0 lb

## 2019-03-12 DIAGNOSIS — Z01419 Encounter for gynecological examination (general) (routine) without abnormal findings: Secondary | ICD-10-CM | POA: Diagnosis not present

## 2019-03-12 NOTE — Patient Instructions (Signed)
Dr. Dennard Nip  [facility]   [address] [place]. [place], [place] [address]   [phone]

## 2019-03-12 NOTE — Addendum Note (Signed)
Addended by: Binnie Rail on: 03/12/2019 09:22 PM   Modules accepted: Orders

## 2019-03-12 NOTE — Progress Notes (Signed)
58 y.o. G1P1001 Single Black or [place] American female here for annual exam.  Doing well.  Denies vaginal bleeding.    Last hbA1C was 8.2.  Adjustments with medications are ongoing right now.    Hospitalized in early April with probable gallstone issues.  Liver enzymes were significantly elevated.  Hepatitis testing was negative.   No LMP recorded. (Menstrual status: IUD).          Sexually active: No.  The current method of family planning is none. Mirena placed 05/2016   Exercising: Yes.    walking Smoker:  no  Health Maintenance: Pap:  10/02/17 Neg. HR HPV:neg   03/18/16 neg  History of abnormal Pap:  Yes MMG:  01/02/18 BIRADS1:neg. Has appt 03/20/18 Colonoscopy:  03/24/15 f/u 7 years  BMD:   never TDaP:  2018 Pneumonia vaccine(s):  2010 Shingrix:  Had varicella vaccination in her 18's (after negative antibody testing) Hep C testing: 09/13/12 neg  Screening Labs: PCP   reports that she has never smoked. She has never used smokeless tobacco. She reports previous alcohol use. She reports that she does not use drugs.  Past Medical History:  Diagnosis Date  . Allergy   . Anemia   . Arthritis   . Asthma    74yrs ago  . Chronic deafness of both ears    left ear has 40% hearing-right ear has 60 % hearing  . Colon polyps   . Complication of anesthesia    Hard to wake up  . Diabetes mellitus without complication ([place])   . Dysmenorrhea   . Migraines    none in last 11 yrs  . Pelvic kidney    left  . Pneumonia   . Post partum depression   . Urinary incontinence     Past Surgical History:  Procedure Laterality Date  . CESAREAN SECTION    . COLONOSCOPY WITH PROPOFOL N/A 03/24/2015   Procedure: COLONOSCOPY WITH PROPOFOL;  Surgeon: Juanita Craver, MD;  Location: [facility];  Service: Endoscopy;  Laterality: N/A;  . HEMORRHOID BANDING  08/2017, 09/2017  . KNEE ARTHROSCOPY WITH MEDIAL MENISECTOMY Left 04/17/2014   Procedure: LEFT KNEE ARTHROSCOPY WITH PARTIAL MEDIAL MENISCECTOMY AND  CHONDROPLASTY;  Surgeon: Johnn Hai, MD;  Location: [facility];  Service: Orthopedics;  Laterality: Left;  . TYMPANOSTOMY TUBE PLACEMENT Right     Current Outpatient Medications  Medication Sig Dispense Refill  . Continuous Blood Gluc Receiver ([place]) DEVI 1 each by Does not apply route daily. Dx E11.65 1 Device 0  . Continuous Blood Gluc Sensor (DEXCOM G6 SENSOR) MISC 1 each by Does not apply route every 14 (fourteen) days. Dx E11.65 2 each 5  . Continuous Blood Gluc Transmit (DEXCOM G6 TRANSMITTER) MISC 1 each by Does not apply route daily. Dx E11.65 1 each 0  . hydrocortisone (ANUSOL-HC) 2.5 % rectal cream Place 1 application rectally 2 (two) times daily. 30 g 0  . linagliptin (TRADJENTA) 5 MG TABS tablet Take 1 tablet (5 mg total) by mouth daily. -- Office visit needed for further refills 30 tablet 0  . Semaglutide, 1 MG/DOSE, (OZEMPIC, 1 MG/DOSE,) 2 MG/1.5ML SOPN Inject 1 mg into the skin once a week. 3 pen 1   No current facility-administered medications for this visit.     Family History  Problem Relation Age of Onset  . Hypertension Mother   . Arthritis Mother   . Hypercalcemia Mother   . Hypertension Sister   . Deep vein thrombosis Sister   .  Pulmonary embolism Sister   . Diabetes Maternal Grandmother   . Heart failure Maternal Grandmother   . Heart disease Maternal Grandmother   . Hyperlipidemia Maternal Grandmother   . Hypertension Maternal Grandmother   . Hypertension Maternal Grandfather   . Hyperlipidemia Maternal Grandfather   . Breast cancer Paternal Aunt        diagnosed in her 95's  . Breast cancer Paternal Grandmother     Review of Systems  All other systems reviewed and are negative.   Exam:   BP 124/74   Pulse 72   Temp (!) 97.4 F (36.3 C) (Temporal)   Ht 5' 2.5" (1.588 m)   Wt 251 lb (113.9 kg)   BMI 45.18 kg/m   Height: 5' 2.5" (158.8 cm)  Ht Readings from Last 3 Encounters:  03/12/19 5' 2.5" (1.588 m)  11/09/18 5\' 1"  (1.549 m)   10/02/17 5' 2.5" (1.588 m)    General appearance: alert, cooperative and appears stated age Head: Normocephalic, without obvious abnormality, atraumatic Neck: no adenopathy, supple, symmetrical, trachea midline and thyroid normal to inspection and palpation Lungs: clear to auscultation bilaterally Breasts: normal appearance, no masses or tenderness Heart: regular rate and rhythm Abdomen: soft, non-tender; bowel sounds normal; no masses,  no organomegaly Extremities: extremities normal, atraumatic, no cyanosis or edema Skin: Skin color, texture, turgor normal. No rashes or lesions Lymph nodes: Cervical, supraclavicular, and axillary nodes normal. No abnormal inguinal nodes palpated Neurologic: Grossly normal  Pelvic: External genitalia:  no lesions              Urethra:  normal appearing urethra with no masses, tenderness or lesions              Bartholins and Skenes: normal                 Vagina: normal appearing vagina with normal color and discharge, no lesions              Cervix: no lesions and IUD string noted              Pap taken: No. Bimanual Exam:  Uterus:  normal size, contour, position, consistency, mobility, non-tender              Adnexa: normal adnexa and no mass, fullness, tenderness               Rectovaginal: Confirms               Anus:  normal sphincter tone, no lesions  Chaperone was present for exam.  A:  Well Woman with normal exam H/O menorrhagia, Mirena placement 10/17 Diabetes, followed by Dr. Quay Burow H/O left pelvic kidney Obesity OSA Asthma  P:   Mammogram guidelines discussed pap smear with neg HR HPV 2019.  Not indicated today. Vaccines UTD Colonoscopy UTD Lab work done with Dr. Quay Burow Return annually or prn

## 2019-03-14 ENCOUNTER — Encounter: Payer: Self-pay | Admitting: Internal Medicine

## 2019-03-14 DIAGNOSIS — E669 Obesity, unspecified: Secondary | ICD-10-CM

## 2019-03-21 ENCOUNTER — Ambulatory Visit
Admission: RE | Admit: 2019-03-21 | Discharge: 2019-03-21 | Disposition: A | Discharge status: Home / Self Care | Payer: 59 | Source: Ambulatory Visit | Attending: Internal Medicine | Admitting: Internal Medicine

## 2019-03-21 ENCOUNTER — Other Ambulatory Visit: Payer: Self-pay

## 2019-03-21 DIAGNOSIS — Z1231 Encounter for screening mammogram for malignant neoplasm of breast: Secondary | ICD-10-CM

## 2019-03-23 ENCOUNTER — Other Ambulatory Visit: Payer: Self-pay | Admitting: Internal Medicine

## 2019-04-06 NOTE — Patient Instructions (Addendum)
Tests ordered today. Your results will be released to MyChart (or called to you) after review.  If any changes need to be made, you will be notified at that same time.  All other Health Maintenance issues reviewed.   All recommended immunizations and age-appropriate screenings are up-to-date or discussed.  No immunization administered today.   Medications reviewed and updated.  Changes include :   none    Please followup in 6 months    Health Maintenance, Female Adopting a healthy lifestyle and getting preventive care are important in promoting health and wellness. Ask your health care provider about:  The right schedule for you to have regular tests and exams.  Things you can do on your own to prevent diseases and keep yourself healthy. What should I know about diet, weight, and exercise? Eat a healthy diet   Eat a diet that includes plenty of vegetables, fruits, low-fat dairy products, and lean protein.  Do not eat a lot of foods that are high in solid fats, added sugars, or sodium. Maintain a healthy weight Body mass index (BMI) is used to identify weight problems. It estimates body fat based on height and weight. Your health care provider can help determine your BMI and help you achieve or maintain a healthy weight. Get regular exercise Get regular exercise. This is one of the most important things you can do for your health. Most adults should:  Exercise for at least 150 minutes each week. The exercise should increase your heart rate and make you sweat (moderate-intensity exercise).  Do strengthening exercises at least twice a week. This is in addition to the moderate-intensity exercise.  Spend less time sitting. Even light physical activity can be beneficial. Watch cholesterol and blood lipids Have your blood tested for lipids and cholesterol at 57 years of age, then have this test every 5 years. Have your cholesterol levels checked more often if:  Your lipid or  cholesterol levels are high.  You are older than 57 years of age.  You are at high risk for heart disease. What should I know about cancer screening? Depending on your health history and family history, you may need to have cancer screening at various ages. This may include screening for:  Breast cancer.  Cervical cancer.  Colorectal cancer.  Skin cancer.  Lung cancer. What should I know about heart disease, diabetes, and high blood pressure? Blood pressure and heart disease  High blood pressure causes heart disease and increases the risk of stroke. This is more likely to develop in people who have high blood pressure readings, are of African descent, or are overweight.  Have your blood pressure checked: ? Every 3-5 years if you are 18-39 years of age. ? Every year if you are 40 years old or older. Diabetes Have regular diabetes screenings. This checks your fasting blood sugar level. Have the screening done:  Once every three years after age 40 if you are at a normal weight and have a low risk for diabetes.  More often and at a younger age if you are overweight or have a high risk for diabetes. What should I know about preventing infection? Hepatitis B If you have a higher risk for hepatitis B, you should be screened for this virus. Talk with your health care provider to find out if you are at risk for hepatitis B infection. Hepatitis C Testing is recommended for:  Everyone born from 1945 through 1965.  Anyone with known risk factors for hepatitis C.   Sexually transmitted infections (STIs)  Get screened for STIs, including gonorrhea and chlamydia, if: ? You are sexually active and are younger than 57 years of age. ? You are older than 57 years of age and your health care provider tells you that you are at risk for this type of infection. ? Your sexual activity has changed since you were last screened, and you are at increased risk for chlamydia or gonorrhea. Ask your  health care provider if you are at risk.  Ask your health care provider about whether you are at high risk for HIV. Your health care provider may recommend a prescription medicine to help prevent HIV infection. If you choose to take medicine to prevent HIV, you should first get tested for HIV. You should then be tested every 3 months for as long as you are taking the medicine. Pregnancy  If you are about to stop having your period (premenopausal) and you may become pregnant, seek counseling before you get pregnant.  Take 400 to 800 micrograms (mcg) of folic acid every day if you become pregnant.  Ask for birth control (contraception) if you want to prevent pregnancy. Osteoporosis and menopause Osteoporosis is a disease in which the bones lose minerals and strength with aging. This can result in bone fractures. If you are 65 years old or older, or if you are at risk for osteoporosis and fractures, ask your health care provider if you should:  Be screened for bone loss.  Take a calcium or vitamin D supplement to lower your risk of fractures.  Be given hormone replacement therapy (HRT) to treat symptoms of menopause. Follow these instructions at home: Lifestyle  Do not use any products that contain nicotine or tobacco, such as cigarettes, e-cigarettes, and chewing tobacco. If you need help quitting, ask your health care provider.  Do not use street drugs.  Do not share needles.  Ask your health care provider for help if you need support or information about quitting drugs. Alcohol use  Do not drink alcohol if: ? Your health care provider tells you not to drink. ? You are pregnant, may be pregnant, or are planning to become pregnant.  If you drink alcohol: ? Limit how much you use to 0-1 drink a day. ? Limit intake if you are breastfeeding.  Be aware of how much alcohol is in your drink. In the [place] one drink equals one 12 oz bottle of beer (355 mL), one 5 oz glass of wine (148  mL), or one 1 oz glass of hard liquor (44 mL). General instructions  Schedule regular health, dental, and eye exams.  Stay current with your vaccines.  Tell your health care provider if: ? You often feel depressed. ? You have ever been abused or do not feel safe at home. Summary  Adopting a healthy lifestyle and getting preventive care are important in promoting health and wellness.  Follow your health care provider's instructions about healthy diet, exercising, and getting tested or screened for diseases.  Follow your health care provider's instructions on monitoring your cholesterol and blood pressure. This information is not intended to replace advice given to you by your health care provider. Make sure you discuss any questions you have with your health care provider. Document Released: 02/07/2011 Document Revised: 07/18/2018 Document Reviewed: 07/18/2018 Elsevier Patient Education  2020 Elsevier Inc.  

## 2019-04-06 NOTE — Progress Notes (Signed)
Subjective:    Patient ID: Jill [name], female    DOB: 05-27-1961, 58 y.o.   MRN: [id number]  HPI She is here for a physical exam.   Her sugars have been good.  Her sugars are more consistent at 125.  Range 65-150.  She is fairly compliant with a diabetic diet and she is exercising.  She has not been able to lose weight.  She is eating about 800 calories a day most days.    Medications and allergies reviewed with patient and updated if appropriate.  Patient Active Problem List   Diagnosis Date Noted  . Abdominal pain 11/09/2018  . Abnormal LFTs (liver function tests) 11/09/2018  . Constipation 05/29/2017  . Hemorrhoids 05/29/2017  . Hematuria 09/07/2016  . IUD (intrauterine device) in place 07/05/2016  . Menorrhagia with irregular cycle 05/03/2016  . Fibroids, intramural 05/03/2016  . Pelvic kidney   . Uncontrolled diabetes mellitus ([place])   . OBESITY, MORBID 07/30/2009  . OBSTRUCTIVE SLEEP APNEA 05/19/2009  . MOTION SICKNESS 03/12/2007  . ASTHMA 01/31/2007  . GERD 01/31/2007  . MIGRAINE, MENSTRUAL 01/31/2007    Current Outpatient Medications on File Prior to Visit  Medication Sig Dispense Refill  . Continuous Blood Gluc Receiver ([place]) DEVI 1 each by Does not apply route daily. Dx E11.65 1 Device 0  . Continuous Blood Gluc Sensor (DEXCOM G6 SENSOR) MISC 1 each by Does not apply route every 14 (fourteen) days. Dx E11.65 2 each 5  . Continuous Blood Gluc Transmit (DEXCOM G6 TRANSMITTER) MISC 1 each by Does not apply route daily. Dx E11.65 1 each 0  . hydrocortisone (ANUSOL-HC) 2.5 % rectal cream Place 1 application rectally 2 (two) times daily. 30 g 0  . Iron-Vitamins (GERITOL TONIC PO) Take by mouth.    . Semaglutide, 1 MG/DOSE, (OZEMPIC, 1 MG/DOSE,) 2 MG/1.5ML SOPN Inject 1 mg into the skin once a week. 3 pen 1   No current facility-administered medications on file prior to visit.     Past Medical History:  Diagnosis Date  . Allergy   .  Anemia   . Arthritis   . Asthma    43yrs ago  . Chronic deafness of both ears    left ear has 40% hearing-right ear has 60 % hearing  . Colon polyps   . Complication of anesthesia    Hard to wake up  . Diabetes mellitus without complication ([place])   . Migraines    none in last 11 yrs  . Pelvic kidney    left  . Pneumonia   . Post partum depression   . Urinary incontinence     Past Surgical History:  Procedure Laterality Date  . CESAREAN SECTION    . COLONOSCOPY WITH PROPOFOL N/A 03/24/2015   Procedure: COLONOSCOPY WITH PROPOFOL;  Surgeon: Juanita Craver, MD;  Location: [facility];  Service: Endoscopy;  Laterality: N/A;  . HEMORRHOID BANDING  08/2017, 09/2017  . KNEE ARTHROSCOPY WITH MEDIAL MENISECTOMY Left 04/17/2014   Procedure: LEFT KNEE ARTHROSCOPY WITH PARTIAL MEDIAL MENISCECTOMY AND CHONDROPLASTY;  Surgeon: Johnn Hai, MD;  Location: [facility];  Service: Orthopedics;  Laterality: Left;  . TYMPANOSTOMY TUBE PLACEMENT Right     Social History   Socioeconomic History  . Marital status: Single    Spouse name: Not on file  . Number of children: 1  . Years of education: college   . Highest education level: Associate degree: academic program  Occupational History  . Occupation: social  services case worker    Employer: [employer]  Social Needs  . Financial resource strain: Not very hard  . Food insecurity    Worry: Patient refused    Inability: Patient refused  . Transportation needs    Medical: Patient refused    Non-medical: Patient refused  Tobacco Use  . Smoking status: Never Smoker  . Smokeless tobacco: Never Used  Substance and Sexual Activity  . Alcohol use: Not Currently  . Drug use: No  . Sexual activity: Not Currently    Partners: Male    Birth control/protection: Abstinence, I.U.D.    Comment: Mirena placed 05/2016  Lifestyle  . Physical activity    Days per week: Patient refused    Minutes per session: Patient refused  . Stress: Patient refused   Relationships  . Social Herbalist on phone: Patient refused    Gets together: Patient refused    Attends religious service: Patient refused    Active member of club or organization: Patient refused    Attends meetings of clubs or organizations: Patient refused    Relationship status: Patient refused  Other Topics Concern  . Not on file  Social History Narrative   Single. Education: [employer].     Family History  Problem Relation Age of Onset  . Hypertension Mother   . Arthritis Mother   . Hypercalcemia Mother   . Hypertension Sister   . Deep vein thrombosis Sister   . Pulmonary embolism Sister   . Diabetes Maternal Grandmother   . Heart failure Maternal Grandmother   . Heart disease Maternal Grandmother   . Hyperlipidemia Maternal Grandmother   . Hypertension Maternal Grandmother   . Hypertension Maternal Grandfather   . Hyperlipidemia Maternal Grandfather   . Breast cancer Paternal Aunt        diagnosed in her 76's  . Breast cancer Paternal Grandmother     Review of Systems  Constitutional: Negative for chills and fever.  Eyes: Negative for visual disturbance.  Respiratory: Negative for cough, shortness of breath and wheezing.   Cardiovascular: Negative for chest pain, palpitations and leg swelling.  Gastrointestinal: Positive for constipation. Negative for abdominal pain, blood in stool, diarrhea and nausea.       No gerd  Genitourinary: Negative for dysuria and hematuria.  Musculoskeletal: Positive for arthralgias (knee).  Skin: Negative for color change and rash.       Dry, itchy skin  Neurological: Negative for light-headedness, numbness and headaches.  Psychiatric/Behavioral: Negative for dysphoric mood. The patient is not nervous/anxious.        Objective:   Vitals:   04/08/19 1405  BP: (!) 158/76  Pulse: 93  Resp: 18  Temp: 98.6 F (37 C)  SpO2: 98%   Filed Weights   04/08/19 1405  Weight: 251 lb 12.8 oz (114.2 kg)   Body mass index  is 45.32 kg/m.  BP Readings from Last 3 Encounters:  04/08/19 (!) 158/76  03/12/19 124/74  11/10/18 (!) 142/83    Wt Readings from Last 3 Encounters:  04/08/19 251 lb 12.8 oz (114.2 kg)  03/12/19 251 lb (113.9 kg)  11/10/18 235 lb 14.3 oz (107 kg)     Physical Exam Constitutional: She appears well-developed and well-nourished. No distress.  HENT:  Head: Normocephalic and atraumatic.  Right Ear: External ear normal. Normal ear canal and TM Left Ear: External ear normal.  Normal ear canal and TM Mouth/Throat: Oropharynx is clear and moist.  Eyes: Conjunctivae and EOM are  normal.  Neck: Neck supple. No tracheal deviation present. No thyromegaly present.  No carotid bruit  Cardiovascular: Normal rate, regular rhythm and normal heart sounds.   No murmur heard.  No edema. Pulmonary/Chest: Effort normal and breath sounds normal. No respiratory distress. She has no wheezes. She has no rales.  Breast: deferred   Abdominal: Soft. She exhibits no distension. Slight tenderness ventral region - mild and chronic w/o rebound or guarding.There is no other tenderness.  Lymphadenopathy: She has no cervical adenopathy.  Skin: Skin is warm and dry. She is not diaphoretic.  Psychiatric: She has a normal mood and affect. Her behavior is normal.        Assessment & Plan:   Physical exam: Screening blood work  ordered Immunizations  Discussed shingrix Colonoscopy   Up to date  Mammogram   Up to date  Gyn   Up to date  Eye exams   Due - will schedule Exercise   Walking 30 minutes Weight   Working on weight loss Skin  Substance abuse  none   See Problem List for Assessment and Plan of chronic medical problems.   FU in 6 months

## 2019-04-08 ENCOUNTER — Other Ambulatory Visit: Payer: Self-pay

## 2019-04-08 ENCOUNTER — Other Ambulatory Visit (INDEPENDENT_AMBULATORY_CARE_PROVIDER_SITE_OTHER): Payer: 59

## 2019-04-08 ENCOUNTER — Encounter: Payer: Self-pay | Admitting: Internal Medicine

## 2019-04-08 ENCOUNTER — Ambulatory Visit (INDEPENDENT_AMBULATORY_CARE_PROVIDER_SITE_OTHER): Payer: 59 | Admitting: Internal Medicine

## 2019-04-08 VITALS — BP 158/76 | HR 93 | Temp 98.6°F | Resp 18 | Ht 62.5 in | Wt 251.8 lb

## 2019-04-08 DIAGNOSIS — E559 Vitamin D deficiency, unspecified: Secondary | ICD-10-CM

## 2019-04-08 DIAGNOSIS — Z0000 Encounter for general adult medical examination without abnormal findings: Secondary | ICD-10-CM

## 2019-04-08 DIAGNOSIS — E1165 Type 2 diabetes mellitus with hyperglycemia: Secondary | ICD-10-CM

## 2019-04-08 DIAGNOSIS — K5900 Constipation, unspecified: Secondary | ICD-10-CM | POA: Diagnosis not present

## 2019-04-08 LAB — MICROALBUMIN / CREATININE URINE RATIO
Creatinine,U: 228.5 mg/dL
Microalb Creat Ratio: 0.8 mg/g (ref 0.0–30.0)
Microalb, Ur: 1.9 mg/dL (ref 0.0–1.9)

## 2019-04-08 LAB — LIPID PANEL
Cholesterol: 191 mg/dL (ref 0–200)
HDL: 39.8 mg/dL (ref >39.00)
LDL Cholesterol: 139 mg/dL — ABNORMAL HIGH (ref 0–99)
NonHDL: 150.8
Total CHOL/HDL Ratio: 5
Triglycerides: 58 mg/dL (ref 0.0–149.0)
VLDL: 11.6 mg/dL (ref 0.0–40.0)

## 2019-04-08 LAB — CBC WITH DIFFERENTIAL/PLATELET
Basophils Absolute: 0 10*3/uL (ref 0.0–0.1)
Basophils Relative: 0.6 % (ref 0.0–3.0)
Eosinophils Absolute: 0.1 10*3/uL (ref 0.0–0.7)
Eosinophils Relative: 0.9 % (ref 0.0–5.0)
HCT: 40.8 % (ref 36.0–46.0)
Hemoglobin: 13.8 g/dL (ref 12.0–15.0)
Lymphocytes Relative: 29.1 % (ref 12.0–46.0)
Lymphs Abs: 2.4 10*3/uL (ref 0.7–4.0)
MCHC: 33.8 g/dL (ref 30.0–36.0)
MCV: 83 fl (ref 78.0–100.0)
Monocytes Absolute: 0.6 10*3/uL (ref 0.1–1.0)
Monocytes Relative: 7 % (ref 3.0–12.0)
Neutro Abs: 5.3 10*3/uL (ref 1.4–7.7)
Neutrophils Relative %: 62.4 % (ref 43.0–77.0)
Platelets: 268 10*3/uL (ref 150.0–400.0)
RBC: 4.92 Mil/uL (ref 3.87–5.11)
RDW: 14.4 % (ref 11.5–15.5)
WBC: 8.4 10*3/uL (ref 4.0–10.5)

## 2019-04-08 LAB — COMPREHENSIVE METABOLIC PANEL
ALT: 14 U/L (ref 0–35)
AST: 10 U/L (ref 0–37)
Albumin: 4.1 g/dL (ref 3.5–5.2)
Alkaline Phosphatase: 81 U/L (ref 39–117)
BUN: 11 mg/dL (ref 6–23)
CO2: 28 mEq/L (ref 19–32)
Calcium: 10.8 mg/dL — ABNORMAL HIGH (ref 8.4–10.5)
Chloride: 104 mEq/L (ref 96–112)
Creatinine, Ser: 1.04 mg/dL (ref 0.40–1.20)
GFR: 66.09 mL/min (ref >60.00)
Glucose, Bld: 106 mg/dL — ABNORMAL HIGH (ref 70–99)
Potassium: 4.2 mEq/L (ref 3.5–5.1)
Sodium: 139 mEq/L (ref 135–145)
Total Bilirubin: 0.4 mg/dL (ref 0.2–1.2)
Total Protein: 8.1 g/dL (ref 6.0–8.3)

## 2019-04-08 LAB — VITAMIN D 25 HYDROXY (VIT D DEFICIENCY, FRACTURES): VITD: 22.27 ng/mL — ABNORMAL LOW (ref 30.00–100.00)

## 2019-04-08 LAB — HEMOGLOBIN A1C: Hgb A1c MFr Bld: 6.8 % — ABNORMAL HIGH (ref 4.6–6.5)

## 2019-04-08 LAB — TSH: TSH: 0.89 u[IU]/mL (ref 0.35–4.50)

## 2019-04-08 MED ORDER — OZEMPIC (1 MG/DOSE) 2 MG/1.5ML ~~LOC~~ SOPN: 1.0000 mg | PEN_INJECTOR | SUBCUTANEOUS | 5 refills | Status: DC

## 2019-04-08 NOTE — Assessment & Plan Note (Signed)
Low in past Check level

## 2019-04-08 NOTE — Assessment & Plan Note (Addendum)
sugars well controlled at home Check a1c,urine micro Low sugar / carb diet Stressed regular exercise Working on weight loss - was referred to healthy weight and management Will try increase calorie intake Get eye appt

## 2019-04-08 NOTE — Assessment & Plan Note (Signed)
Has been walking 30 min daily and has been regular

## 2019-04-11 ENCOUNTER — Encounter: Payer: Self-pay | Admitting: Internal Medicine

## 2019-04-12 MED ORDER — ROSUVASTATIN CALCIUM 5 MG PO TABS: 5.0000 mg | ORAL_TABLET | Freq: Every day | ORAL | 1 refills | Status: DC

## 2019-04-18 ENCOUNTER — Encounter: Payer: Self-pay | Admitting: Internal Medicine

## 2019-04-19 NOTE — Telephone Encounter (Signed)
Letter written. Pt aware.

## 2019-04-23 ENCOUNTER — Other Ambulatory Visit: Payer: Self-pay | Admitting: Internal Medicine

## 2019-06-03 ENCOUNTER — Encounter: Payer: 59 | Admitting: Internal Medicine

## 2019-07-24 ENCOUNTER — Other Ambulatory Visit: Payer: Self-pay | Admitting: Internal Medicine

## 2019-07-24 DIAGNOSIS — E1165 Type 2 diabetes mellitus with hyperglycemia: Secondary | ICD-10-CM

## 2019-07-25 ENCOUNTER — Other Ambulatory Visit: Payer: Self-pay | Admitting: Internal Medicine

## 2019-08-28 ENCOUNTER — Encounter: Payer: Self-pay | Admitting: Internal Medicine

## 2019-08-31 ENCOUNTER — Encounter: Payer: Self-pay | Admitting: Internal Medicine

## 2019-09-04 ENCOUNTER — Ambulatory Visit (INDEPENDENT_AMBULATORY_CARE_PROVIDER_SITE_OTHER): Payer: 59 | Admitting: Family Medicine

## 2019-09-06 ENCOUNTER — Encounter: Payer: Self-pay | Admitting: Internal Medicine

## 2019-09-10 ENCOUNTER — Telehealth: Payer: 59 | Admitting: Physician Assistant

## 2019-09-10 DIAGNOSIS — R059 Cough, unspecified: Secondary | ICD-10-CM

## 2019-09-10 DIAGNOSIS — R05 Cough: Secondary | ICD-10-CM

## 2019-09-10 DIAGNOSIS — U071 COVID-19: Secondary | ICD-10-CM

## 2019-09-10 MED ORDER — BENZONATATE 100 MG PO CAPS: 100.0000 mg | ORAL_CAPSULE | Freq: Three times a day (TID) | ORAL | 0 refills | Status: DC | PRN

## 2019-09-10 MED ORDER — ALBUTEROL SULFATE HFA 108 (90 BASE) MCG/ACT IN AERS: 1.0000 | INHALATION_SPRAY | Freq: Four times a day (QID) | RESPIRATORY_TRACT | 0 refills | Status: DC | PRN

## 2019-09-10 NOTE — Progress Notes (Signed)
We are sorry that you are not feeling well.  Here is how we plan to help!  Based on your presentation I believe you most likely have A cough due to a virus.  This is called viral bronchitis and is best treated by rest, plenty of fluids and control of the cough.  You may use Ibuprofen or Tylenol as directed to help your symptoms.     In addition you may use A prescription cough medication called Tessalon Perles 100mg . You may take 1-2 capsules every 8 hours as needed for your cough.  I have also sent a refill of an albuterol inhaler to be used 1-2 puffs every 4-6 hours as needed. This can help relieve bronchospasm related to persistent cough.   I would also recommend you purchase a device called a pulse oximeter which can tell you your oxygen levels. This can be purchased at [employer] or online. Anything below 90% is worrisome, and if you are experiencing worsening shortness of breath with oxygen levels around 90-92% this is also concerning. I would recommend immediate evaluation in this case at an emergency department.   From your responses in the eVisit questionnaire you describe inflammation in the upper respiratory tract which is causing a significant cough.  This is commonly called Bronchitis and has four common causes:    Allergies  Viral Infections  Acid Reflux  Bacterial Infection Allergies, viruses and acid reflux are treated by controlling symptoms or eliminating the cause. An example might be a cough caused by taking certain blood pressure medications. You stop the cough by changing the medication. Another example might be a cough caused by acid reflux. Controlling the reflux helps control the cough.  USE OF BRONCHODILATOR ("RESCUE") INHALERS: There is a risk from using your bronchodilator too frequently.  The risk is that over-reliance on a medication which only relaxes the muscles surrounding the breathing tubes can reduce the effectiveness of medications prescribed to reduce  swelling and congestion of the tubes themselves.  Although you feel brief relief from the bronchodilator inhaler, your asthma may actually be worsening with the tubes becoming more swollen and filled with mucus.  This can delay other crucial treatments, such as oral steroid medications. If you need to use a bronchodilator inhaler daily, several times per day, you should discuss this with your provider.  There are probably better treatments that could be used to keep your asthma under control.     HOME CARE . Only take medications as instructed by your medical team. . Complete the entire course of an antibiotic. . Drink plenty of fluids and get plenty of rest. . Avoid close contacts especially the very young and the elderly . Cover your mouth if you cough or cough into your sleeve. . Always remember to wash your hands . A steam or ultrasonic humidifier can help congestion.   GET HELP RIGHT AWAY IF: . You develop worsening fever. . You become short of breath . You cough up blood. . Your symptoms persist after you have completed your treatment plan MAKE SURE YOU   Understand these instructions.  Will watch your condition.  Will get help right away if you are not doing well or get worse.  Your e-visit answers were reviewed by a board certified advanced clinical practitioner to complete your personal care plan.  Depending on the condition, your plan could have included both over the counter or prescription medications. If there is a problem please reply  once you have received a  response from your provider. Your safety is important to [place].  If you have drug allergies check your prescription carefully.    You can use MyChart to ask questions about today's visit, request a non-urgent call back, or ask for a work or school excuse for 24 hours related to this e-Visit. If it has been greater than 24 hours you will need to follow up with your provider, or enter a new e-Visit to address those  concerns. You will get an e-mail in the next two days asking about your experience.  I hope that your e-visit has been valuable and will speed your recovery. Thank you for using e-visits.  Greater than 5 minutes, yet less than 10 minutes of time have been spent researching, coordinating, and implementing care for this patient today.

## 2019-09-12 ENCOUNTER — Encounter: Payer: Self-pay | Admitting: Internal Medicine

## 2019-09-13 ENCOUNTER — Ambulatory Visit (INDEPENDENT_AMBULATORY_CARE_PROVIDER_SITE_OTHER): Payer: 59 | Admitting: Internal Medicine

## 2019-09-13 ENCOUNTER — Encounter: Payer: Self-pay | Admitting: Internal Medicine

## 2019-09-13 DIAGNOSIS — U071 COVID-19: Secondary | ICD-10-CM | POA: Diagnosis not present

## 2019-09-13 MED ORDER — METHYLPREDNISOLONE 4 MG PO TBPK: ORAL_TABLET | ORAL | 0 refills | Status: DC

## 2019-09-13 MED ORDER — HYDROCODONE-HOMATROPINE 5-1.5 MG/5ML PO SYRP: 5.0000 mL | ORAL_SOLUTION | Freq: Three times a day (TID) | ORAL | 0 refills | Status: DC | PRN

## 2019-09-13 NOTE — Assessment & Plan Note (Signed)
She was positive for Covid-19 on 1/22 Most of her symptoms have improved, but she still has a significant cough and some wheezing The sputum is clear and she is no longer having any fevers At this point I do not think she has a bacterial infection-still likely viral in nature Will try Hycodan cough syrup, Medrol Dosepak and see how she does with this over the next couple of days If symptoms worsen we can either try an antibiotic or have her go to the respiratory clinic for further evaluation Advised her to update me over the next couple of days, sooner if her symptoms worsen Discussed that the cough can linger for a few weeks after Covid

## 2019-09-13 NOTE — Telephone Encounter (Signed)
Pt has an appointment.  

## 2019-09-13 NOTE — Progress Notes (Signed)
Virtual Visit via Video Note  I connected with Jill [name] on 09/13/19 at 10:30 AM EST by a video enabled telemedicine application and verified that I am speaking with the correct person using two identifiers.   I discussed the limitations of evaluation and management by telemedicine and the availability of in person appointments. The patient expressed understanding and agreed to proceed.  Present for the visit:  Myself, Dr Billey Gosling, Cherene Altes.  The patient is currently at home and I am in the office.    No referring provider.    History of Present Illness: This is an acute visit for   Symptoms started around 1/20 - she lost her taste and smell and her covid test was positive.  She is temperature 101.2, along with all of the other symptoms.  She did the 10-day isolation and took everything for cold and flu.  Most of her symptoms have completely resolved, except for her cough.  She is coughing a lot.  She feels like she has a clump of mucus in her chest and she just cannot get it up.  She has difficulty talking too much because of the cough.  She is bringing up some clear sputum.  She has some wheezing.  She is not having any significant shortness of breath and denies chest pain.  She initially had back pain, but that has resolved.  She has not had any fevers or chills.  She is using the [employer] and the albuterol inhaler, but they are not helping.  She just wants to get some relief of the cough.   Review of Systems  Constitutional: Negative for chills and fever.  HENT:       Taste/smell back to normal  Respiratory: Positive for cough, sputum production (clear) and wheezing. Negative for shortness of breath.        Some chest tightness  Cardiovascular: Negative for chest pain.  Gastrointestinal: Negative for diarrhea and nausea.  Neurological: Negative for headaches.     Social History   Socioeconomic History  . Marital status: Single    Spouse name: Not  on file  . Number of children: 1  . Years of education: college   . Highest education level: Associate degree: academic program  Occupational History  . Occupation: social services case worker    Employer: [employer]  Tobacco Use  . Smoking status: Never Smoker  . Smokeless tobacco: Never Used  Substance and Sexual Activity  . Alcohol use: Not Currently  . Drug use: No  . Sexual activity: Not Currently    Partners: Male    Birth control/protection: Abstinence, I.U.D.    Comment: Mirena placed 05/2016  Other Topics Concern  . Not on file  Social History Narrative   Single. Education: [employer].    Social Determinants of Health   Financial Resource Strain: Low Risk   . Difficulty of Paying Living Expenses: Not very hard  Food Insecurity: Unknown  . Worried About Charity fundraiser in the Last Year: Patient refused  . Ran Out of Food in the Last Year: Patient refused  Transportation Needs: Unknown  . Lack of Transportation (Medical): Patient refused  . Lack of Transportation (Non-Medical): Patient refused  Physical Activity: Unknown  . Days of Exercise per Week: Patient refused  . Minutes of Exercise per Session: Patient refused  Stress: Unknown  . Feeling of Stress : Patient refused  Social Connections: Unknown  . Frequency of Communication with Friends and Family: Patient refused  .  Frequency of Social Gatherings with Friends and Family: Patient refused  . Attends Religious Services: Patient refused  . Active Member of Clubs or Organizations: Patient refused  . Attends Archivist Meetings: Patient refused  . Marital Status: Patient refused     Observations/Objective: Appears well in NAD Breathing normally, but frequently coughing that is spasmodic and takes her breath away.  Cough appears to be dry.  No audible wheezing.  The more she talked more she coughed.  Skin appears warm and dry.  Assessment and Plan:  See Problem List for Assessment and Plan  of chronic medical problems.   Follow Up Instructions:    I discussed the assessment and treatment plan with the patient. The patient was provided an opportunity to ask questions and all were answered. The patient agreed with the plan and demonstrated an understanding of the instructions.   The patient was advised to call back or seek an in-person evaluation if the symptoms worsen or if the condition fails to improve as anticipated.    Binnie Rail, MD

## 2019-09-16 ENCOUNTER — Encounter: Payer: Self-pay | Admitting: Internal Medicine

## 2019-09-18 ENCOUNTER — Ambulatory Visit (INDEPENDENT_AMBULATORY_CARE_PROVIDER_SITE_OTHER): Payer: 59 | Admitting: Family Medicine

## 2019-09-20 NOTE — Telephone Encounter (Signed)
Letter received and appointment made for Monday at respiratory clinic due to no opening today. Pt advised to go to UC if she can not get through the weekend and things get worse.

## 2019-09-23 ENCOUNTER — Other Ambulatory Visit: Payer: Self-pay

## 2019-09-23 ENCOUNTER — Ambulatory Visit (INDEPENDENT_AMBULATORY_CARE_PROVIDER_SITE_OTHER): Payer: 59

## 2019-09-23 ENCOUNTER — Ambulatory Visit (INDEPENDENT_AMBULATORY_CARE_PROVIDER_SITE_OTHER): Payer: 59 | Admitting: Family Medicine

## 2019-09-23 VITALS — BP 140/80 | HR 98 | Temp 98.8°F | Ht 61.0 in | Wt 260.0 lb

## 2019-09-23 DIAGNOSIS — U071 COVID-19: Secondary | ICD-10-CM | POA: Diagnosis not present

## 2019-09-23 DIAGNOSIS — R05 Cough: Secondary | ICD-10-CM

## 2019-09-23 DIAGNOSIS — J019 Acute sinusitis, unspecified: Secondary | ICD-10-CM

## 2019-09-23 DIAGNOSIS — R059 Cough, unspecified: Secondary | ICD-10-CM

## 2019-09-23 DIAGNOSIS — E1169 Type 2 diabetes mellitus with other specified complication: Secondary | ICD-10-CM

## 2019-09-23 DIAGNOSIS — R0602 Shortness of breath: Secondary | ICD-10-CM

## 2019-09-23 MED ORDER — CEFDINIR 300 MG PO CAPS: 300.0000 mg | ORAL_CAPSULE | Freq: Two times a day (BID) | ORAL | 0 refills | Status: AC

## 2019-09-23 MED ORDER — IPRATROPIUM BROMIDE 0.03 % NA SOLN: 2.0000 | Freq: Two times a day (BID) | NASAL | 0 refills | Status: DC

## 2019-09-23 MED ORDER — FLUTICASONE PROPIONATE (INHAL) 50 MCG/BLIST IN AEPB: 1.0000 | INHALATION_SPRAY | Freq: Two times a day (BID) | RESPIRATORY_TRACT | 12 refills | Status: DC

## 2019-09-23 MED ORDER — HYDROCODONE-HOMATROPINE 5-1.5 MG/5ML PO SYRP: 5.0000 mL | ORAL_SOLUTION | Freq: Three times a day (TID) | ORAL | 0 refills | Status: DC | PRN

## 2019-09-23 NOTE — Progress Notes (Signed)
Patient ID: Jill [name], female    DOB: 1961/07/06, 59 y.o.   MRN: [id number]  PCP: Binnie Rail, MD  No chief complaint on file.   Subjective:  HPI  Jill [name] is a 59 y.o. female presents to [facility] for evaluation of symptoms related to recently diagnosed COVID-19 infection. COVID-19 positive on 08/30/2019.    Patient works at the [employer] and has not returned to work since her diagnosis.   She continues to be symptomatic of the following symptoms: fatigue, persistent cough with activity, back and shoulder pain, congestion. No fever. Cough is non-productive. PCP prescribed hycodan which improved cough, although induces drowsiness. Recently had scant amount of nasal drainage with mix with small amount of blood. Sinus congestion has not improved. PCP prescribed prednisone back 09/13/19, taper although, this has mildly improved shortness of breath, but has not improved congestion symptoms.  Chronic conditions increasing risk of [id number] complications include: Diabetes, morbid obesity, and hyperlipidemia.   Review of Systems Pertinent negatives listed in HPI  Patient Active Problem List   Diagnosis Date Noted  . COVID-19 virus infection 09/13/2019  . Vitamin D deficiency 04/08/2019  . Abdominal pain 11/09/2018  . Abnormal LFTs (liver function tests) 11/09/2018  . Constipation 05/29/2017  . Hemorrhoids 05/29/2017  . Menorrhagia with irregular cycle 05/03/2016  . Fibroids, intramural 05/03/2016  . Pelvic kidney   . Uncontrolled diabetes mellitus ([place])   . OBESITY, MORBID 07/30/2009  . OBSTRUCTIVE SLEEP APNEA 05/19/2009  . MOTION SICKNESS 03/12/2007  . ASTHMA 01/31/2007  . GERD 01/31/2007  . MIGRAINE, MENSTRUAL 01/31/2007      Prior to Admission medications   Medication Sig Start Date End Date Taking? Authorizing Provider  albuterol (VENTOLIN HFA) 108 (90 Base) MCG/ACT inhaler Inhale 1-2 puffs into the lungs every 6  (six) hours as needed for wheezing or shortness of breath. 09/10/19  Yes Fawze, Mina A, PA-C  benzonatate (TESSALON) 100 MG capsule Take 1 capsule (100 mg total) by mouth 3 (three) times daily as needed for cough. 09/10/19  Yes Fawze, Mina A, PA-C  Continuous Blood Gluc Receiver ([place]) DEVI 1 each by Does not apply route daily. Dx E11.65 11/23/18  Yes Binnie Rail, MD  Continuous Blood Gluc Sensor (DEXCOM G6 SENSOR) MISC USE 1 EVERY 10 DAYS 07/24/19  Yes Hoyt Koch, MD  Continuous Blood Gluc Transmit (DEXCOM G6 TRANSMITTER) MISC USE AS DIRECTED 07/24/19  Yes Hoyt Koch, MD  HYDROcodone-homatropine [facility]) 5-1.5 MG/5ML syrup Take 5 mLs by mouth every 8 (eight) hours as needed for cough. 09/13/19  Yes Burns, Claudina Lick, MD  Iron-Vitamins (GERITOL TONIC PO) Take by mouth.   Yes [provider]  methylPREDNISolone (MEDROL DOSEPAK) 4 MG TBPK tablet 24 mg PO on day 1, then decr. by 4 mg/day x5 days 09/13/19  Yes Burns, Claudina Lick, MD  Semaglutide, 1 MG/DOSE, (OZEMPIC, 1 MG/DOSE,) 2 MG/1.5ML SOPN Inject 1 mg into the skin once a week. 04/08/19  Yes Burns, Claudina Lick, MD  hydrocortisone (ANUSOL-HC) 2.5 % rectal cream Place 1 application rectally 2 (two) times daily. 12/12/18   Binnie Rail, MD  rosuvastatin (CRESTOR) 5 MG tablet Take 1 tablet (5 mg total) by mouth daily. 04/12/19   Binnie Rail, MD    Past Medical, Surgical Family and Social History reviewed and updated.    Objective:   Today's Vitals   09/23/19 1739  BP: 140/80  Pulse: 98  Temp: 98.8 F (37.1  C)  TempSrc: Oral  SpO2: 98%  Weight: 260 lb (117.9 kg)  Height: 5\' 1"  (1.549 m)    Wt Readings from Last 3 Encounters:  09/23/19 260 lb (117.9 kg)  04/08/19 251 lb 12.8 oz (114.2 kg)  03/12/19 251 lb (113.9 kg)   Physical Exam Constitutional:      Appearance: She is obese. She is ill-appearing.  HENT:     Head: Normocephalic.     Nose: Mucosal edema and congestion present.     Right Turbinates:  Enlarged.     Left Turbinates: Enlarged.  Cardiovascular:     Rate and Rhythm: Normal rate and regular rhythm.  Pulmonary:     Breath sounds: Decreased air movement present. No wheezing, rhonchi or rales.  Musculoskeletal:        General: Normal range of motion.     Cervical back: No rigidity.  Neurological:     General: No focal deficit present.     Mental Status: She is alert.  Psychiatric:        Mood and Affect: Mood normal.          Assessment & Plan:  1. COVID-19 virus infection, stable,  Current on-going respiratory symptoms Chest x-ray is pending.  Rule out pneumonia CBC, to evaluate WBC and Hemoglobin given the duration of chronic symptoms. - Basic metabolic panel; Patient is a type II diabetic evaluate renal function and glucose, and electrolytes   2. Shortness of breath -Opted against resuming prednisone due to Diabetes and concern for worsening A1C -Start Fluticasone 1 puff twice daily until symptoms of shortness of breath resolve.   -Covering with Cefdinir in the event of a pneumonia and/or active bronchitis -Chest x-ray is pending    - CBC with Differential/Platelet; pending  - Basic metabolic panel; pending    3. Cough -Refilled Hycodan cough syrup every 8 hours as needed. -Use Flovent inhaler along with albuterol as needed for cyclic cough Given concurrent sinus symptoms suspect cough may be exacerbated by postnasal drainage.  Patient is also being covered for sinusitis today which should improve persistency of cough. - CBC with Differential/Platelet; Future - Basic metabolic panel; Future   4. Type 2 diabetes mellitus with other specified complication, without long-term current use of insulin ([place]) -Encourage close monitoring of glucose given recent prednisone taper Patient follow-up with PCP to have A1c rechecked in 4 to 6 weeks.  5. Sinusitis  -Omnicef 300 mg twice daily  x10 days -Atrovent nasal spray twice daily as needed for  congestion     Meds ordered this encounter  Medications  . cefdinir (OMNICEF) 300 MG capsule    Sig: Take 1 capsule (300 mg total) by mouth 2 (two) times daily for 10 days.    Dispense:  20 capsule    Refill:  0  . HYDROcodone-homatropine (HYCODAN) 5-1.5 MG/5ML syrup    Sig: Take 5 mLs by mouth every 8 (eight) hours as needed for cough.    Dispense:  120 mL    Refill:  0  . ipratropium (ATROVENT) 0.03 % nasal spray    Sig: Place 2 sprays into both nostrils 2 (two) times daily.    Dispense:  30 mL    Refill:  0  . fluticasone (FLOVENT DISKUS) 50 MCG/BLIST diskus inhaler    Sig: Inhale 1 puff into the lungs 2 (two) times daily.    Dispense:  1 Inhaler    Refill:  12    DG Chest Portable 2 Views  Result Date:  09/23/2019 CLINICAL DATA:  Short of breath, cough, previous COVID-19 08/29/2019 EXAM: CHEST  2 VIEW PORTABLE COMPARISON:  None. FINDINGS: The heart size and mediastinal contours are within normal limits. Both lungs are clear. The visualized skeletal structures are unremarkable. IMPRESSION: No active cardiopulmonary disease. Electronically Signed   By: Randa Ngo M.D.   On: 09/23/2019 19:17     -The patient was given clear instructions to go to ER or return to medical center if symptoms do not improve, worsen or new problems develop. The patient verbalized understanding.     Molli Barrows, FNP-C [facility], PRN Provider  [facility]. [place], [place] [facility] Phone: [phone] Clinic Fax: [phone] Clinic Hours: 5:30 pm -7:30 pm (Monday-Friday)

## 2019-09-23 NOTE — Patient Instructions (Addendum)
Will notify you via My Chart of your x-ray results. Complete all medication as prescribed.  I added Atrovent nasal spray to manage nasal congestion. (this will help with congestion) Follow-up with PCP if symptoms worsen or do not improve.     10 Things You Can Do to Manage Your COVID-19 Symptoms at Home If you have possible or confirmed COVID-19: 1. Stay home from work and school. And stay away from other public places. If you must go out, avoid using any kind of public transportation, ridesharing, or taxis. 2. Monitor your symptoms carefully. If your symptoms get worse, call your healthcare provider immediately. 3. Get rest and stay hydrated. 4. If you have a medical appointment, call the healthcare provider ahead of time and tell them that you have or may have COVID-19. 5. For medical emergencies, call 911 and notify the dispatch personnel that you have or may have COVID-19. 6. Cover your cough and sneezes with a tissue or use the inside of your elbow. 7. Wash your hands often with soap and water for at least 20 seconds or clean your hands with an alcohol-based hand sanitizer that contains at least 60% alcohol. 8. As much as possible, stay in a specific room and away from other people in your home. Also, you should use a separate bathroom, if available. If you need to be around other people in or outside of the home, wear a mask. 9. Avoid sharing personal items with other people in your household, like dishes, towels, and bedding. 10. Clean all surfaces that are touched often, like counters, tabletops, and doorknobs. Use household cleaning sprays or wipes according to the label instructions. [email or web address] 02/06/2019 This information is not intended to replace advice given to you by your health care provider. Make sure you discuss any questions you have with your health care provider. Document Revised: 07/11/2019 Document Reviewed: 07/11/2019 Elsevier Patient Education  [place].

## 2019-09-24 LAB — BASIC METABOLIC PANEL
BUN/Creatinine Ratio: 15 (ref 9–23)
BUN: 13 mg/dL (ref 6–24)
CO2: 22 mmol/L (ref 20–29)
Calcium: 10.5 mg/dL — ABNORMAL HIGH (ref 8.7–10.2)
Chloride: 104 mmol/L (ref 96–106)
Creatinine, Ser: 0.85 mg/dL (ref 0.57–1.00)
GFR calc Af Amer: 88 mL/min/{1.73_m2} (ref >59)
GFR calc non Af Amer: 76 mL/min/{1.73_m2} (ref >59)
Glucose: 290 mg/dL — ABNORMAL HIGH (ref 65–99)
Potassium: 4.5 mmol/L (ref 3.5–5.2)
Sodium: 138 mmol/L (ref 134–144)

## 2019-09-24 LAB — CBC WITH DIFFERENTIAL/PLATELET
Basophils Absolute: 0 10*3/uL (ref 0.0–0.2)
Basos: 0 %
EOS (ABSOLUTE): 0.1 10*3/uL (ref 0.0–0.4)
Eos: 1 %
Hematocrit: 41.9 % (ref 34.0–46.6)
Hemoglobin: 13.8 g/dL (ref 11.1–15.9)
Immature Grans (Abs): 0 10*3/uL (ref 0.0–0.1)
Immature Granulocytes: 0 %
Lymphocytes Absolute: 1.9 10*3/uL (ref 0.7–3.1)
Lymphs: 27 %
MCH: 27 pg (ref 26.6–33.0)
MCHC: 32.9 g/dL (ref 31.5–35.7)
MCV: 82 fL (ref 79–97)
Monocytes Absolute: 0.4 10*3/uL (ref 0.1–0.9)
Monocytes: 6 %
Neutrophils Absolute: 4.7 10*3/uL (ref 1.4–7.0)
Neutrophils: 66 %
Platelets: 187 10*3/uL (ref 150–450)
RBC: 5.12 x10E6/uL (ref 3.77–5.28)
RDW: 14.1 % (ref 11.7–15.4)
WBC: 7.2 10*3/uL (ref 3.4–10.8)

## 2019-09-26 ENCOUNTER — Encounter: Payer: Self-pay | Admitting: Internal Medicine

## 2019-10-08 ENCOUNTER — Ambulatory Visit: Payer: 59 | Admitting: Internal Medicine

## 2019-10-13 ENCOUNTER — Other Ambulatory Visit: Payer: Self-pay | Admitting: Family Medicine

## 2019-10-23 ENCOUNTER — Other Ambulatory Visit: Payer: Self-pay

## 2019-10-23 ENCOUNTER — Ambulatory Visit (INDEPENDENT_AMBULATORY_CARE_PROVIDER_SITE_OTHER): Payer: 59 | Admitting: Family Medicine

## 2019-10-23 ENCOUNTER — Encounter (INDEPENDENT_AMBULATORY_CARE_PROVIDER_SITE_OTHER): Payer: Self-pay | Admitting: Family Medicine

## 2019-10-23 VITALS — BP 166/83 | HR 88 | Temp 97.8°F | Ht 62.0 in | Wt 252.0 lb

## 2019-10-23 DIAGNOSIS — R5383 Other fatigue: Secondary | ICD-10-CM

## 2019-10-23 DIAGNOSIS — Z1331 Encounter for screening for depression: Secondary | ICD-10-CM | POA: Diagnosis not present

## 2019-10-23 DIAGNOSIS — Z0289 Encounter for other administrative examinations: Secondary | ICD-10-CM

## 2019-10-23 DIAGNOSIS — Z9189 Other specified personal risk factors, not elsewhere classified: Secondary | ICD-10-CM

## 2019-10-23 DIAGNOSIS — R0602 Shortness of breath: Secondary | ICD-10-CM | POA: Diagnosis not present

## 2019-10-23 DIAGNOSIS — E1165 Type 2 diabetes mellitus with hyperglycemia: Secondary | ICD-10-CM

## 2019-10-23 DIAGNOSIS — E559 Vitamin D deficiency, unspecified: Secondary | ICD-10-CM

## 2019-10-23 DIAGNOSIS — R030 Elevated blood-pressure reading, without diagnosis of hypertension: Secondary | ICD-10-CM

## 2019-10-23 DIAGNOSIS — G4733 Obstructive sleep apnea (adult) (pediatric): Secondary | ICD-10-CM | POA: Diagnosis not present

## 2019-10-23 DIAGNOSIS — Z6841 Body Mass Index (BMI) 40.0 and over, adult: Secondary | ICD-10-CM

## 2019-10-23 DIAGNOSIS — E7849 Other hyperlipidemia: Secondary | ICD-10-CM

## 2019-10-23 NOTE — Progress Notes (Signed)
Dear Dr. Quay [name],   Thank you for referring Jill [name] to our clinic. The following note includes my evaluation and treatment recommendations.  Chief Complaint:   OBESITY Jill [name] (MR# [id number]) [name] who presents for evaluation and treatment of obesity and related comorbidities. Current BMI [name] Body mass index [name] 46.09 kg/m. Jill [name] has been struggling with her weight for many years and has been unsuccessful in either losing weight, maintaining weight loss, or reaching her healthy weight goal.  Jill [name] in the action stage of change and ready to dedicate time achieving and maintaining a healthier weight. Jill [name] interested in becoming our patient and working on intensive lifestyle modifications including (but not limited to) diet and exercise for weight loss.  Jill [name] has been skipping breakfast daily but having Jill [name] 16-20 ounce bottle and says she has no hunger.  For lunch (12 pm - 1 pm) she has Jill [name] food at Jill [name] ([employer] with vegetables and noodles) eat half and drink a Coke or Jill [name] 2 piece (leg & thigh) with Jill [name].  For dinner, she reports having Jill [name] pizza (1 slice) with Jill [name] or Jill [name] chicken (1 leg) and 1 slice of brisket.  Jill [name] habits were reviewed today and are as follows: Her family sometimes eats meals together, her desired weight loss [name] 110 pounds, she has been heavy most of her life, her heaviest weight ever was her current weight, she [name] a picky eater, she craves soda, she skips breakfast everyday, she [name] frequently drinking liquids with calories and she struggles with emotional eating.  Depression Screen Jill [name]'s Food and Mood (modified PHQ-9) score was 6.  Depression screen Jill [name] 2/9 10/23/2019  Decreased Interest 3  Down, Depressed, Hopeless 1  PHQ - 2 Score 4  Altered sleeping 1  Tired, decreased energy 0  Change in appetite 1  Feeling bad or failure about  yourself  0  Trouble concentrating 0  Moving slowly or fidgety/restless 0  Suicidal thoughts 0  PHQ-9 Score 6  Difficult doing work/chores Not difficult at all   Subjective:   1. Other fatigue Jill [name] admits to daytime somnolence and reports waking up still tired. Patent has a history of symptoms of morning fatigue and morning headache. Jill [name] generally gets 5 or 6 hours of sleep per night, and states that she has generally restful sleep. Snoring [name] present. Apneic episodes are present. Epworth Sleepiness Score [name] 12.  2. SOB (shortness of breath) on exertion Jill [name] notes increasing shortness of breath with exercising and seems to be worsening over time with weight gain. She notes getting out of breath sooner with activity than she used to. This has gotten worse recently. Jill [name] denies shortness of breath at rest or orthopnea.  3. Type 2 diabetes mellitus with hyperglycemia, without long-term current use of insulin (HCC) Jill [name] on Ozempic 1 mg weekly (she has been on this for 1 year).  She [name] not checking blood sugars at all.  Her last eye exam was around 2 years ago.  Lab Results  Component Value Date   HGBA1C 6.8 (H) 04/08/2019   HGBA1C 8.2 (H) 02/20/2019   HGBA1C 10.8 (H) 11/09/2018   Lab Results  Component Value Date   MICROALBUR 1.9 04/08/2019   LDLCALC 139 (H) 04/08/2019   CREATININE 0.85 09/23/2019   4. Other hyperlipidemia Jill [name] has hyperlipidemia and has been trying to improve her cholesterol levels with intensive lifestyle modification including a low saturated  fat diet, exercise and weight loss. She denies any chest pain, claudication or myalgias.  She [name] not on a statin.  She previously tried a medication but had side effects.  Lab Results  Component Value Date   ALT 14 04/08/2019   AST 10 04/08/2019   ALKPHOS 81 04/08/2019   BILITOT 0.4 04/08/2019   Lab Results  Component Value Date   CHOL 191 04/08/2019   HDL 39.80 04/08/2019   LDLCALC  139 (H) 04/08/2019   LDLDIRECT 125 (H) 09/10/2014   TRIG 58.0 04/08/2019   CHOLHDL 5 04/08/2019   5. Vitamin D deficiency Jill [name]'s Vitamin D level was 22.27 on 04/08/2019. She [name] not currently taking vit D. She denies nausea, vomiting or muscle weakness.  She endorses fatigue.  6. Elevated blood pressure reading Jill [name]'s blood pressure [name] elevated today.  She does not have a diagnosis of hypertension.  Blood pressure was taken manually.  BP Readings from Last 3 Encounters:  10/23/19 (!) 166/83  09/23/19 140/80  04/08/19 (!) 158/76   7. OSA (obstructive sleep apnea) [name] states that she was previously worked up for OSA but did not finish the test.  It sounds like the patient did meet apnea criteria.   8. Depression screening Jill [name] was screened for depression as part of her new patient workup today.  9. At risk for osteoporosis Jill [name] [name] at higher risk of osteopenia and osteoporosis due to Vitamin D deficiency.   Assessment/Plan:   1. Other fatigue Jill [name] does feel that her weight [name] causing her energy to be lower than it should be. Fatigue may be related to obesity, depression or many other causes. Labs will be ordered, and in the meanwhile, Jill [name] will focus on self care including making healthy food choices, increasing physical activity and focusing on stress reduction. - EKG 12-Lead - T3 - T4, free - TSH  2. SOB (shortness of breath) on exertion Jill [name] does feel that she gets out of breath more easily that she used to when she exercises. Jill [name] shortness of breath appears to be obesity related and exercise induced. She has [name] to work on weight loss and gradually increase exercise to treat her exercise induced shortness of breath. Will continue to monitor closely. - Lipid Panel With LDL/HDL Ratio  3. Type 2 diabetes mellitus with hyperglycemia, without long-term current use of insulin (HCC) Will check A1c and insulin today.  Jill [name] [name] to  schedule an eye exam.  Hypoglycemia protocol discussed. - Hemoglobin A1c - Insulin, random  4. Other hyperlipidemia Cardiovascular risk and specific lipid/LDL goals reviewed.  We discussed several lifestyle modifications today and Jill [name] will continue to work on diet, exercise and weight loss efforts. Orders and follow up as documented in patient record.   Counseling Intensive lifestyle modifications are the first line treatment for this issue. . Dietary changes: Increase soluble fiber. Decrease simple carbohydrates. . Exercise changes: Moderate to vigorous-intensity aerobic activity 150 minutes per week if tolerated. . Lipid-lowering medications: see documented in medical record. - Lipid Panel With LDL/HDL Ratio  5. Vitamin D deficiency Low Vitamin D level contributes to fatigue and are associated with obesity, breast, and colon cancer.  Will check vitamin D level today. - VITAMIN D 25 Hydroxy (Vit-D Deficiency, Fractures)  6. Elevated blood pressure reading Will follow-up blood pressure at next appointment.  EKG was performed today.  7. OSA (obstructive sleep apnea) Follow-up with patient for work up of OSA.  8. Depression screening Jill [name] had a positive depression screening. Depression [name]  commonly associated with obesity and often results in emotional eating behaviors. We will monitor this closely and work on CBT to help improve the non-hunger eating patterns. Referral to Psychology may be required if no improvement [name] seen as she continues in our clinic.  9. At risk for osteoporosis Jill [name] was given approximately 15 minutes of osteoporosis prevention counseling today. Jill [name] at risk for osteopenia and osteoporosis due to her Vitamin D deficiency. She was encouraged to take her Vitamin D and follow her higher calcium diet and increase strengthening exercise to help strengthen her bones and decrease her risk of osteopenia and osteoporosis.  Repetitive spaced learning  was employed today to elicit superior memory formation and behavioral change.  10. Class 3 severe obesity with serious comorbidity and body mass index (BMI) of 45.0 to 49.9 in adult, unspecified obesity type (HCC) Jill [name] in the action stage of change and her goal [name] to continue with weight loss efforts. I recommend Jill [name] begin the structured treatment plan as follows:  She has [name] to the Category 3 Plan.  Exercise goals: No exercise has been prescribed at this time.   Behavioral modification strategies: increasing lean protein intake, increasing vegetables, meal planning and cooking strategies, keeping healthy foods in the home and planning for success.  She was informed of the importance of frequent follow-up visits to maximize her success with intensive lifestyle modifications for her multiple health conditions. She was informed we would discuss her lab results at her next visit unless there [name] a critical issue that needs to be addressed sooner. Jill [name] to keep her next visit at the [name] upon time to discuss these results.  Objective:   Blood pressure (!) 166/83, pulse 88, temperature 97.8 F (36.6 C), temperature source Oral, height 5\' 2"  (1.575 m), weight 252 lb (114.3 kg), SpO2 100 %. Body mass index [name] 46.09 kg/m.  EKG: Normal sinus rhythm, rate 82 bpm.  Indirect Calorimeter completed today shows a VO2 of 233 and a REE of 1622.  Her calculated basal metabolic rate [name] [id number] thus her basal metabolic rate [name] worse than expected.  General: Cooperative, alert, well developed, in no acute distress. HEENT: Conjunctivae and lids unremarkable. Cardiovascular: Regular rhythm.  Lungs: Normal work of breathing. Neurologic: No focal deficits.   Lab Results  Component Value Date   CREATININE 0.85 09/23/2019   BUN 13 09/23/2019   NA 138 09/23/2019   K 4.5 09/23/2019   CL 104 09/23/2019   CO2 22 09/23/2019   Lab Results  Component Value Date   ALT 14  04/08/2019   AST 10 04/08/2019   ALKPHOS 81 04/08/2019   BILITOT 0.4 04/08/2019   Lab Results  Component Value Date   HGBA1C 6.8 (H) 04/08/2019   HGBA1C 8.2 (H) 02/20/2019   HGBA1C 10.8 (H) 11/09/2018   HGBA1C 6.7 (H) 10/17/2017   HGBA1C 5.6 05/29/2017   Lab Results  Component Value Date   TSH 0.89 04/08/2019   Lab Results  Component Value Date   CHOL 191 04/08/2019   HDL 39.80 04/08/2019   LDLCALC 139 (H) 04/08/2019   LDLDIRECT 125 (H) 09/10/2014   TRIG 58.0 04/08/2019   CHOLHDL 5 04/08/2019   Lab Results  Component Value Date   WBC 7.2 09/23/2019   HGB 13.8 09/23/2019   HCT 41.9 09/23/2019   MCV 82 09/23/2019   PLT 187 09/23/2019   Lab Results  Component Value Date   IRON 40 (L) 06/27/2016   Attestation Statements:  This [name] the patient's first visit at [facility]. The patient's NEW PATIENT PACKET was reviewed at length. Included in the packet: current and past health history, medications, allergies, ROS, gynecologic history (women only), surgical history, family history, social history, weight history, weight loss surgery history (for those that have had weight loss surgery), nutritional evaluation, mood and food questionnaire, PHQ9, Epworth questionnaire, sleep habits questionnaire, patient life and health improvement goals questionnaire. These will all be scanned into the patient's chart under media.   During the visit, I independently reviewed the patient's EKG, bioimpedance scale results, and indirect calorimeter results. I used this information to tailor a meal plan for the patient that will help her to lose weight and will improve her obesity-related conditions going forward. I performed a medically necessary appropriate examination and/or evaluation. I discussed the assessment and treatment plan with the patient. The patient was provided an opportunity to ask questions and all were answered. The patient [name] with the plan and demonstrated an  understanding of the instructions. Labs were ordered at this visit and will be reviewed at the next visit unless more critical results need to be addressed immediately.   Clinical information was updated and documented in the EMR.   Time spent on visit including pre-visit chart review and post-visit care was 45 minutes.   A separate 15 minutes was spent on risk counseling (see above).    I, Water quality scientist, CMA, am acting as transcriptionist for Coralie Common, MD.  I have reviewed the above documentation for accuracy and completeness, and I agree with the above. - Ilene Qua, MD

## 2019-10-24 LAB — LIPID PANEL WITH LDL/HDL RATIO
Cholesterol, Total: 216 mg/dL — ABNORMAL HIGH (ref 100–199)
HDL: 47 mg/dL (ref >39)
LDL Chol Calc (NIH): 155 mg/dL — ABNORMAL HIGH (ref 0–99)
LDL/HDL Ratio: 3.3 ratio — ABNORMAL HIGH (ref 0.0–3.2)
Triglycerides: 77 mg/dL (ref 0–149)
VLDL Cholesterol Cal: 14 mg/dL (ref 5–40)

## 2019-10-24 LAB — INSULIN, RANDOM: INSULIN: 25.4 u[IU]/mL — ABNORMAL HIGH (ref 2.6–24.9)

## 2019-10-24 LAB — HEMOGLOBIN A1C
Est. average glucose Bld gHb Est-mCnc: 272 mg/dL
Hgb A1c MFr Bld: 11.1 % — ABNORMAL HIGH (ref 4.8–5.6)

## 2019-10-24 LAB — T3: T3, Total: 102 ng/dL (ref 71–180)

## 2019-10-24 LAB — VITAMIN D 25 HYDROXY (VIT D DEFICIENCY, FRACTURES): Vit D, 25-Hydroxy: 18.7 ng/mL — ABNORMAL LOW (ref 30.0–100.0)

## 2019-10-24 LAB — T4, FREE: Free T4: 1.39 ng/dL (ref 0.82–1.77)

## 2019-10-24 LAB — TSH: TSH: 1.6 u[IU]/mL (ref 0.450–4.500)

## 2019-10-28 ENCOUNTER — Encounter (INDEPENDENT_AMBULATORY_CARE_PROVIDER_SITE_OTHER): Payer: Self-pay | Admitting: Family Medicine

## 2019-10-28 NOTE — Telephone Encounter (Signed)
Please advise 

## 2019-11-06 ENCOUNTER — Other Ambulatory Visit: Payer: Self-pay

## 2019-11-06 ENCOUNTER — Encounter (INDEPENDENT_AMBULATORY_CARE_PROVIDER_SITE_OTHER): Payer: Self-pay | Admitting: Family Medicine

## 2019-11-06 ENCOUNTER — Ambulatory Visit (INDEPENDENT_AMBULATORY_CARE_PROVIDER_SITE_OTHER): Payer: 59 | Admitting: Family Medicine

## 2019-11-06 VITALS — BP 134/81 | HR 74 | Temp 98.1°F | Ht 62.0 in | Wt 254.0 lb

## 2019-11-06 DIAGNOSIS — E1169 Type 2 diabetes mellitus with other specified complication: Secondary | ICD-10-CM | POA: Diagnosis not present

## 2019-11-06 DIAGNOSIS — E559 Vitamin D deficiency, unspecified: Secondary | ICD-10-CM

## 2019-11-06 DIAGNOSIS — Z6841 Body Mass Index (BMI) 40.0 and over, adult: Secondary | ICD-10-CM

## 2019-11-06 DIAGNOSIS — E1165 Type 2 diabetes mellitus with hyperglycemia: Secondary | ICD-10-CM

## 2019-11-06 DIAGNOSIS — Z9189 Other specified personal risk factors, not elsewhere classified: Secondary | ICD-10-CM

## 2019-11-06 DIAGNOSIS — E785 Hyperlipidemia, unspecified: Secondary | ICD-10-CM

## 2019-11-06 MED ORDER — METFORMIN HCL 1000 MG PO TABS: 1000.0000 mg | ORAL_TABLET | Freq: Every day | ORAL | 0 refills | Status: DC

## 2019-11-06 MED ORDER — VITAMIN D (ERGOCALCIFEROL) 1.25 MG (50000 UNIT) PO CAPS: 50000.0000 [IU] | ORAL_CAPSULE | ORAL | 0 refills | Status: DC

## 2019-11-06 MED ORDER — ATORVASTATIN CALCIUM 10 MG PO TABS: 10.0000 mg | ORAL_TABLET | Freq: Every day | ORAL | 0 refills | Status: DC

## 2019-11-07 NOTE — Progress Notes (Signed)
Chief Complaint:   OBESITY Jill [name] is here to discuss her progress with her obesity treatment plan along with follow-up of her obesity related diagnoses. Jill [name] is on the Category 3 Plan and states she is following her eating plan approximately 60% of the time. Jill [name] states she is exercising 0 minutes 0 times per week.  Today's visit was #: 2 Starting weight: 252 lbs Starting date: 10/23/2019 Today's weight: 254 lbs Today's date: 11/06/2019 Total lbs lost to date: 0 Total lbs lost since last in-office visit: 0  Interim History: Makinley reports breakfast and lunch are going well, but dinner is difficult in terms of figuring out what to eat. She has been logging food on her [employer] app and is ranging ~mid-80's to 100's grams of protein. She finds herself boredom eating frequently.  Subjective:   Vitamin D deficiency. Jill [name] is not on Vitamin D supplementation. She endorses fatigue. Last Vitamin D 18.7 on 10/23/2019.  Hyperlipidemia associated with type 2 diabetes mellitus (Jill [name]). Jill [name] is not on Crestor 5 mg secondary to myalgias. Her 10-year ASCVD risk is 17.8% - high intensity statin is recommended.  Lab Results  Component Value Date   CHOL 216 (H) 10/23/2019   HDL 47 10/23/2019   LDLCALC 155 (H) 10/23/2019   LDLDIRECT 125 (H) 09/10/2014   TRIG 77 10/23/2019   CHOLHDL 5 04/08/2019   Lab Results  Component Value Date   ALT 14 04/08/2019   AST 10 04/08/2019   ALKPHOS 81 04/08/2019   BILITOT 0.4 04/08/2019   The 10-year ASCVD risk score Jill Bussing DC Jr., Jill al., 2013) is: 12.7%   Values used to calculate the score:     Age: 59 years     Sex: Female     Is Non-Hispanic African American: Yes     Diabetic: Yes     Tobacco smoker: No     Systolic Blood Pressure: [id number] mmHg     Is BP treated: No     HDL Cholesterol: 47 mg/dL     Total Cholesterol: 216 mg/dL  Type 2 diabetes mellitus with hyperglycemia, without long-term current use of insulin  (Jill [name]). Blood sugars are ranging between 109 and 220. She is not on statin, metformin, or ACE/ARB.  Lab Results  Component Value Date   HGBA1C 11.1 (H) 10/23/2019   HGBA1C 6.8 (H) 04/08/2019   HGBA1C 8.2 (H) 02/20/2019   Lab Results  Component Value Date   MICROALBUR 1.9 04/08/2019   LDLCALC 155 (H) 10/23/2019   CREATININE 0.85 09/23/2019   Lab Results  Component Value Date   INSULIN 25.4 (H) 10/23/2019   At risk for osteoporosis. Jill [name] is at higher risk of osteopenia and osteoporosis due to Vitamin D deficiency.   Assessment/Plan:   Vitamin D deficiency. Low Vitamin D level contributes to fatigue and are associated with obesity, breast, and colon cancer. She will start Vitamin D, Ergocalciferol, (DRISDOL) 1.25 MG (50000 UNIT) CAPS capsule every week #4 with 0 refills and will follow-up for routine testing of Vitamin D, at least 2-3 times per year to avoid over-replacement.     Hyperlipidemia associated with type 2 diabetes mellitus (Jill [name]). Cardiovascular risk and specific lipid/LDL goals reviewed.  We discussed several lifestyle modifications today and Myisha will continue to work on diet, exercise and weight loss efforts. Orders and follow up as documented in patient record. Renn will start atorvastatin (LIPITOR) 10 MG tablet PO daily #30 with 0 refills.  Counseling Intensive lifestyle modifications are the  first line treatment for this issue. . Dietary changes: Increase soluble fiber. Decrease simple carbohydrates. . Exercise changes: Moderate to vigorous-intensity aerobic activity 150 minutes per week if tolerated. . Lipid-lowering medications: see documented in medical record.    Type 2 diabetes mellitus with hyperglycemia, without long-term current use of insulin (Jill [name]).  Good blood sugar control is important to decrease the likelihood of diabetic complications such as nephropathy, neuropathy, limb loss, blindness, coronary artery disease, and death. Intensive lifestyle  modification including diet, exercise and weight loss are the first line of treatment for diabetes. Jill [name] will start metFORMIN (GLUCOPHAGE) 1000 MG tablet PO daily #30 with 0 refills.  At risk for osteoporosis. Anjalina was given approximately 15 minutes of osteoporosis prevention counseling today. Jill [name] is at risk for osteopenia and osteoporosis due to her Vitamin D deficiency. She was encouraged to take her Vitamin D and follow her higher calcium diet and increase strengthening exercise to help strengthen her bones and decrease her risk of osteopenia and osteoporosis.  Repetitive spaced learning was employed today to elicit superior memory formation and behavioral change.  Class 3 severe obesity with serious comorbidity and body mass index (BMI) of 45.0 to 49.9 in adult, unspecified obesity type (Jill [name]).  Jill [name] is currently in the action stage of change. As such, her goal is to continue with weight loss efforts. She has agreed to keeping a food journal and adhering to recommended goals of 1400-1500 calories and 90+ grams of protein.   Exercise goals: No exercise has been prescribed at this time.  Behavioral modification strategies: increasing lean protein intake, increasing vegetables, meal planning and cooking strategies, keeping healthy foods in the home, planning for success and keeping a strict food journal.  Jill [name] has agreed to follow-up with our clinic in 2 weeks. She was informed of the importance of frequent follow-up visits to maximize her success with intensive lifestyle modifications for her multiple health conditions.   Objective:   Blood pressure 134/81, pulse 74, temperature 98.1 F (36.7 C), temperature source Oral, height 5\' 2"  (1.575 m), weight 254 lb (115.2 kg), SpO2 99 %. Body mass index is 46.46 kg/m.  General: Cooperative, alert, well developed, in no acute distress. HEENT: Conjunctivae and lids unremarkable. Cardiovascular: Regular rhythm.  Lungs:  Normal work of breathing. Neurologic: No focal deficits.   Lab Results  Component Value Date   CREATININE 0.85 09/23/2019   BUN 13 09/23/2019   NA 138 09/23/2019   K 4.5 09/23/2019   CL 104 09/23/2019   CO2 22 09/23/2019   Lab Results  Component Value Date   ALT 14 04/08/2019   AST 10 04/08/2019   ALKPHOS 81 04/08/2019   BILITOT 0.4 04/08/2019   Lab Results  Component Value Date   HGBA1C 11.1 (H) 10/23/2019   HGBA1C 6.8 (H) 04/08/2019   HGBA1C 8.2 (H) 02/20/2019   HGBA1C 10.8 (H) 11/09/2018   HGBA1C 6.7 (H) 10/17/2017   Lab Results  Component Value Date   INSULIN 25.4 (H) 10/23/2019   Lab Results  Component Value Date   TSH 1.600 10/23/2019   Lab Results  Component Value Date   CHOL 216 (H) 10/23/2019   HDL 47 10/23/2019   LDLCALC 155 (H) 10/23/2019   LDLDIRECT 125 (H) 09/10/2014   TRIG 77 10/23/2019   CHOLHDL 5 04/08/2019   Lab Results  Component Value Date   WBC 7.2 09/23/2019   HGB 13.8 09/23/2019   HCT 41.9 09/23/2019   MCV 82 09/23/2019   PLT 187 09/23/2019  Lab Results  Component Value Date   IRON 40 (L) 06/27/2016   Attestation Statements:   Reviewed by clinician on day of visit: allergies, medications, problem list, medical history, surgical history, family history, social history, and previous encounter notes.  I, Michaelene Song, am acting as transcriptionist for Coralie Common, MD   I have reviewed the above documentation for accuracy and completeness, and I agree with the above. - Ilene Qua, MD

## 2019-11-25 ENCOUNTER — Encounter (INDEPENDENT_AMBULATORY_CARE_PROVIDER_SITE_OTHER): Payer: Self-pay | Admitting: Family Medicine

## 2019-11-25 ENCOUNTER — Other Ambulatory Visit: Payer: Self-pay

## 2019-11-25 ENCOUNTER — Ambulatory Visit (INDEPENDENT_AMBULATORY_CARE_PROVIDER_SITE_OTHER): Payer: 59 | Admitting: Family Medicine

## 2019-11-25 ENCOUNTER — Other Ambulatory Visit (INDEPENDENT_AMBULATORY_CARE_PROVIDER_SITE_OTHER): Payer: Self-pay | Admitting: Family Medicine

## 2019-11-25 VITALS — BP 145/83 | HR 106 | Temp 98.2°F | Ht 62.0 in | Wt 253.0 lb

## 2019-11-25 DIAGNOSIS — E785 Hyperlipidemia, unspecified: Secondary | ICD-10-CM

## 2019-11-25 DIAGNOSIS — E1169 Type 2 diabetes mellitus with other specified complication: Secondary | ICD-10-CM | POA: Diagnosis not present

## 2019-11-25 DIAGNOSIS — E559 Vitamin D deficiency, unspecified: Secondary | ICD-10-CM | POA: Diagnosis not present

## 2019-11-25 DIAGNOSIS — E1165 Type 2 diabetes mellitus with hyperglycemia: Secondary | ICD-10-CM

## 2019-11-25 DIAGNOSIS — Z6841 Body Mass Index (BMI) 40.0 and over, adult: Secondary | ICD-10-CM

## 2019-11-25 DIAGNOSIS — Z9189 Other specified personal risk factors, not elsewhere classified: Secondary | ICD-10-CM

## 2019-11-25 MED ORDER — VITAMIN D (ERGOCALCIFEROL) 1.25 MG (50000 UNIT) PO CAPS: 50000.0000 [IU] | ORAL_CAPSULE | ORAL | 0 refills | Status: DC

## 2019-11-25 MED ORDER — METFORMIN HCL 1000 MG PO TABS: 1000.0000 mg | ORAL_TABLET | Freq: Every day | ORAL | 0 refills | Status: DC

## 2019-11-25 MED ORDER — ATORVASTATIN CALCIUM 10 MG PO TABS: 10.0000 mg | ORAL_TABLET | Freq: Every day | ORAL | 0 refills | Status: DC

## 2019-11-27 NOTE — Progress Notes (Signed)
Chief Complaint:   OBESITY Jill [name] is here to discuss her progress with her obesity treatment plan along with follow-up of her obesity related diagnoses. Jill [name] is on keeping a food journal and adhering to recommended goals of 1400-1500 calories and 90+ grams of protein daily and states she is following her eating plan approximately 95% of the time. Jill [name] states she is walking for 15 minutes 3 times per week.  Today's visit was #: 3 Starting weight: 252 lbs Starting date: 10/23/2019 Today's weight: 253 lbs Today's date: 11/25/2019 Total lbs lost to date: 0 Total lbs lost since last in-office visit: 1  Interim History: Jill [name] started walking for 15 minutes 3 times per week. Her calorie intake is good some days, but she found she does better in terms of hunger if she eats out at lunch and has a microwave meal at dinner. She is eating 90 grams of protein per day.  Subjective:   1. Hyperlipidemia associated with type 2 diabetes mellitus ([place]) Jill [name] is on atorvastatin and she denies myalgias.  2. Type 2 diabetes mellitus with hyperglycemia, without long-term current use of insulin ([place]) Jill [name] notes feelings of hypoglycemia 2 times per week, but she voices her BGs are not low when she checks. Her average BGs range between 105 and 160.  3. Vitamin D deficiency Jill [name] denies nausea, vomiting, or muscle weakness, but she notes fatigue. Last Vit D level was 18.7.  4. At risk for osteoporosis Jill [name] is at higher risk of osteopenia and osteoporosis due to Vitamin D deficiency.   Assessment/Plan:   1. Hyperlipidemia associated with type 2 diabetes mellitus ([place]) Cardiovascular risk and specific lipid/LDL goals reviewed. We discussed several lifestyle modifications today and Jill [name] will continue to work on diet, exercise and weight loss efforts. We will refill atorvastatin for 1 month. Orders and follow up as documented in patient record.   Counseling Intensive  lifestyle modifications are the first line treatment for this issue. . Dietary changes: Increase soluble fiber. Decrease simple carbohydrates. . Exercise changes: Moderate to vigorous-intensity aerobic activity 150 minutes per week if tolerated. . Lipid-lowering medications: see documented in medical record.  - atorvastatin (LIPITOR) 10 MG tablet; Take 1 tablet (10 mg total) by mouth daily.  Dispense: 30 tablet; Refill: 0  2. Type 2 diabetes mellitus with hyperglycemia, without long-term current use of insulin ([facility]) Good blood sugar control is important to decrease the likelihood of diabetic complications such as nephropathy, neuropathy, limb loss, blindness, coronary artery disease, and death. Intensive lifestyle modification including diet, exercise and weight loss are the first line of treatment for diabetes. We will refill metformin for 1 month.  - metFORMIN (GLUCOPHAGE) 1000 MG tablet; Take 1 tablet (1,000 mg total) by mouth daily with breakfast.  Dispense: 30 tablet; Refill: 0  3. Vitamin D deficiency Low Vitamin D level contributes to fatigue and are associated with obesity, breast, and colon cancer. We will refill prescription Vitamin D for 1 month. Jill [name] will follow-up for routine testing of Vitamin D, at least 2-3 times per year to avoid over-replacement.  - Vitamin D, Ergocalciferol, (DRISDOL) 1.25 MG (50000 UNIT) CAPS capsule; Take 1 capsule (50,000 Units total) by mouth every 7 (seven) days.  Dispense: 4 capsule; Refill: 0  4. At risk for osteoporosis Jill [name] was given approximately 15 minutes of osteoporosis prevention counseling today. Jill [name] is at risk for osteopenia and osteoporosis due to her Vitamin D deficiency. She was encouraged to take her Vitamin D and follow her higher calcium  diet and increase strengthening exercise to help strengthen her bones and decrease her risk of osteopenia and osteoporosis.  Repetitive spaced learning was employed today to elicit superior  memory formation and behavioral change.  5. Class 3 severe obesity with serious comorbidity and body mass index (BMI) of 45.0 to 49.9 in adult, unspecified obesity type ([facility]) Jill [name] is currently in the action stage of change. As such, her goal is to continue with weight loss efforts. She has agreed to keeping a food journal and adhering to recommended goals of 1400-1500 calories and 90+ grams of protein daily.   Exercise goals: As is.  Behavioral modification strategies: increasing lean protein intake, increasing vegetables, planning for success and keeping a strict food journal.  Jill [name] has agreed to follow-up with our clinic in 2 weeks. She was informed of the importance of frequent follow-up visits to maximize her success with intensive lifestyle modifications for her multiple health conditions.   Objective:   Blood pressure (!) 145/83, pulse (!) 106, temperature 98.2 F (36.8 C), temperature source Oral, height 5\' 2"  (1.575 m), weight 253 lb (114.8 kg), SpO2 97 %. Body mass index is 46.27 kg/m.  General: Cooperative, alert, well developed, in no acute distress. HEENT: Conjunctivae and lids unremarkable. Cardiovascular: Regular rhythm.  Lungs: Normal work of breathing. Neurologic: No focal deficits.   Lab Results  Component Value Date   CREATININE 0.85 09/23/2019   BUN 13 09/23/2019   NA 138 09/23/2019   K 4.5 09/23/2019   CL 104 09/23/2019   CO2 22 09/23/2019   Lab Results  Component Value Date   ALT 14 04/08/2019   AST 10 04/08/2019   ALKPHOS 81 04/08/2019   BILITOT 0.4 04/08/2019   Lab Results  Component Value Date   HGBA1C 11.1 (H) 10/23/2019   HGBA1C 6.8 (H) 04/08/2019   HGBA1C 8.2 (H) 02/20/2019   HGBA1C 10.8 (H) 11/09/2018   HGBA1C 6.7 (H) 10/17/2017   Lab Results  Component Value Date   INSULIN 25.4 (H) 10/23/2019   Lab Results  Component Value Date   TSH 1.600 10/23/2019   Lab Results  Component Value Date   CHOL 216 (H) 10/23/2019   HDL 47  10/23/2019   LDLCALC 155 (H) 10/23/2019   LDLDIRECT 125 (H) 09/10/2014   TRIG 77 10/23/2019   CHOLHDL 5 04/08/2019   Lab Results  Component Value Date   WBC 7.2 09/23/2019   HGB 13.8 09/23/2019   HCT 41.9 09/23/2019   MCV 82 09/23/2019   PLT 187 09/23/2019   Lab Results  Component Value Date   IRON 40 (L) 06/27/2016   Attestation Statements:   Reviewed by clinician on day of visit: allergies, medications, problem list, medical history, surgical history, family history, social history, and previous encounter notes.   I, Trixie Dredge, am acting as transcriptionist for April Manson, MD.  I have reviewed the above documentation for accuracy and completeness, and I agree with the above. - Jill Qua, MD

## 2019-12-10 ENCOUNTER — Other Ambulatory Visit: Payer: Self-pay

## 2019-12-10 ENCOUNTER — Ambulatory Visit (INDEPENDENT_AMBULATORY_CARE_PROVIDER_SITE_OTHER): Payer: 59 | Admitting: Family Medicine

## 2019-12-10 ENCOUNTER — Encounter (INDEPENDENT_AMBULATORY_CARE_PROVIDER_SITE_OTHER): Payer: Self-pay | Admitting: Family Medicine

## 2019-12-10 VITALS — BP 150/82 | HR 102 | Temp 98.3°F | Ht 62.0 in | Wt 254.0 lb

## 2019-12-10 DIAGNOSIS — Z9189 Other specified personal risk factors, not elsewhere classified: Secondary | ICD-10-CM | POA: Diagnosis not present

## 2019-12-10 DIAGNOSIS — Z794 Long term (current) use of insulin: Secondary | ICD-10-CM

## 2019-12-10 DIAGNOSIS — E1165 Type 2 diabetes mellitus with hyperglycemia: Secondary | ICD-10-CM | POA: Diagnosis not present

## 2019-12-10 DIAGNOSIS — I10 Essential (primary) hypertension: Secondary | ICD-10-CM | POA: Diagnosis not present

## 2019-12-10 DIAGNOSIS — Z6841 Body Mass Index (BMI) 40.0 and over, adult: Secondary | ICD-10-CM

## 2019-12-10 MED ORDER — DEXCOM G6 TRANSMITTER MISC: 0 refills | Status: DC

## 2019-12-10 MED ORDER — DEXCOM G6 RECEIVER DEVI: 1.0000 | Freq: Every day | 1 refills | Status: DC

## 2019-12-10 MED ORDER — DEXCOM G6 SENSOR MISC: 5 refills | Status: DC

## 2019-12-10 MED ORDER — LISINOPRIL 5 MG PO TABS: 5.0000 mg | ORAL_TABLET | Freq: Every day | ORAL | 0 refills | Status: DC

## 2019-12-11 NOTE — Progress Notes (Signed)
Chief Complaint:   OBESITY Jill [name] is here to discuss her progress with her obesity treatment plan along with follow-up of her obesity related diagnoses. Jill [name] is on keeping a food journal and adhering to recommended goals of 1400-1500 calories and 90+ grams of protein and states she is following her eating plan approximately 98% of the time. Jill [name] states she is walking for 15 minutes 3 times per week.  Today's visit was #: 4 Starting weight: 252 lbs Starting date: 10/23/2019 Today's weight: 254 lbs Today's date: 12/10/2019 Total lbs lost to date: 0 Total lbs lost since last in-office visit: 0  Interim History: Jill [name] has journaled almost all day everyday.  She reports going over her calories occasionally and realizes it is likely from indulgent choices she is making.  She says she is eating out fairly frequently (almost daily).  Subjective:   1. Type 2 diabetes mellitus with hyperglycemia, with long-term current use of insulin ([place]) Jill [name] hasn't been checking her blood sugars at all, but when she was checking, blood sugar ranged between 120-160.  She recently started using Dexcom, and says this has been helpful with managing her blood sugar.   Lab Results  Component Value Date   HGBA1C 11.1 (H) 10/23/2019   HGBA1C 6.8 (H) 04/08/2019   HGBA1C 8.2 (H) 02/20/2019   Lab Results  Component Value Date   MICROALBUR 1.9 04/08/2019   LDLCALC 155 (H) 10/23/2019   CREATININE 0.85 09/23/2019   Lab Results  Component Value Date   INSULIN 25.4 (H) 10/23/2019   2. Essential hypertension Review: taking medications as instructed, no medication side effects noted, no chest pain on exertion, no dyspnea on exertion, no swelling of ankles.  Jill [name] took 5 mg lisinopril in the past.  BP Readings from Last 3 Encounters:  12/10/19 (!) 150/82  11/25/19 (!) 145/83  11/06/19 134/81   3. At risk for heart disease Jill [name] is at a higher than average risk for cardiovascular  disease due to obesity.   Assessment/Plan:   1. Type 2 diabetes mellitus with hyperglycemia, with long-term current use of insulin ([facility]) Good blood sugar control is important to decrease the likelihood of diabetic complications such as nephropathy, neuropathy, limb loss, blindness, coronary artery disease, and death. Intensive lifestyle modification including diet, exercise and weight loss are the first line of treatment for diabetes.  - Continuous Blood Gluc Receiver ([place]) Jill [name]; 1 each by Does not apply route daily. Dx E11.65  Dispense: 1 each; Refill: 1 - Continuous Blood Gluc Sensor (DEXCOM G6 SENSOR) MISC; USE 1 EVERY 10 DAYS  Dispense: 3 each; Refill: 5 - Continuous Blood Gluc Transmit (DEXCOM G6 TRANSMITTER) MISC; USE AS DIRECTED  Dispense: 1 each; Refill: 0  2. Essential hypertension Jill [name] is working on healthy weight loss and exercise to improve blood pressure control. We will watch for signs of hypotension as she continues her lifestyle modifications.  Will start lisinopril today. - lisinopril (ZESTRIL) 5 MG tablet; Take 1 tablet (5 mg total) by mouth daily.  Dispense: 30 tablet; Refill: 0  3. At risk for heart disease Jill [name] was given approximately 15 minutes of coronary artery disease prevention counseling today. She is 59 y.o. female and has risk factors for heart disease including obesity. We discussed intensive lifestyle modifications today with an emphasis on specific weight loss instructions and strategies.   Repetitive spaced learning was employed today to elicit superior memory formation and behavioral change.  4. Class 3 severe obesity with serious comorbidity  and body mass index (BMI) of 45.0 to 49.9 in adult, unspecified obesity type ([facility]) Jill [name] is currently in the action stage of change. As such, her goal is to continue with weight loss efforts. She has agreed to keeping a food journal and adhering to recommended goals of 1400-1500 calories and 90+  grams of protein daily.   Exercise goals: As is.  Behavioral modification strategies: increasing lean protein intake, increasing vegetables, meal planning and cooking strategies, keeping healthy foods in the home and keeping a strict food journal.  Jill [name] has agreed to follow-up with our clinic in 2 weeks. She was informed of the importance of frequent follow-up visits to maximize her success with intensive lifestyle modifications for her multiple health conditions.   Objective:   Blood pressure (!) 150/82, pulse (!) 102, temperature 98.3 F (36.8 C), temperature source Oral, height 5\' 2"  (1.575 m), weight 254 lb (115.2 kg), SpO2 97 %. Body mass index is 46.46 kg/m.  General: Cooperative, alert, well developed, in no acute distress. HEENT: Conjunctivae and lids unremarkable. Cardiovascular: Regular rhythm.  Lungs: Normal work of breathing. Neurologic: No focal deficits.   Lab Results  Component Value Date   CREATININE 0.85 09/23/2019   BUN 13 09/23/2019   NA 138 09/23/2019   K 4.5 09/23/2019   CL 104 09/23/2019   CO2 22 09/23/2019   Lab Results  Component Value Date   ALT 14 04/08/2019   AST 10 04/08/2019   ALKPHOS 81 04/08/2019   BILITOT 0.4 04/08/2019   Lab Results  Component Value Date   HGBA1C 11.1 (H) 10/23/2019   HGBA1C 6.8 (H) 04/08/2019   HGBA1C 8.2 (H) 02/20/2019   HGBA1C 10.8 (H) 11/09/2018   HGBA1C 6.7 (H) 10/17/2017   Lab Results  Component Value Date   INSULIN 25.4 (H) 10/23/2019   Lab Results  Component Value Date   TSH 1.600 10/23/2019   Lab Results  Component Value Date   CHOL 216 (H) 10/23/2019   HDL 47 10/23/2019   LDLCALC 155 (H) 10/23/2019   LDLDIRECT 125 (H) 09/10/2014   TRIG 77 10/23/2019   CHOLHDL 5 04/08/2019   Lab Results  Component Value Date   WBC 7.2 09/23/2019   HGB 13.8 09/23/2019   HCT 41.9 09/23/2019   MCV 82 09/23/2019   PLT 187 09/23/2019   Lab Results  Component Value Date   IRON 40 (L) 06/27/2016    Attestation Statements:   Reviewed by clinician on day of visit: allergies, medications, problem list, medical history, surgical history, family history, social history, and previous encounter notes.  I, Water quality scientist, CMA, am acting as transcriptionist for Coralie Common, MD.  I have reviewed the above documentation for accuracy and completeness, and I agree with the above. - Jinny Blossom, MD

## 2019-12-19 ENCOUNTER — Encounter (INDEPENDENT_AMBULATORY_CARE_PROVIDER_SITE_OTHER): Payer: Self-pay

## 2019-12-25 ENCOUNTER — Other Ambulatory Visit (INDEPENDENT_AMBULATORY_CARE_PROVIDER_SITE_OTHER): Payer: Self-pay | Admitting: Family Medicine

## 2019-12-25 DIAGNOSIS — E559 Vitamin D deficiency, unspecified: Secondary | ICD-10-CM

## 2019-12-26 ENCOUNTER — Encounter (INDEPENDENT_AMBULATORY_CARE_PROVIDER_SITE_OTHER): Payer: Self-pay | Admitting: Family Medicine

## 2019-12-26 ENCOUNTER — Other Ambulatory Visit: Payer: Self-pay

## 2019-12-26 ENCOUNTER — Ambulatory Visit (INDEPENDENT_AMBULATORY_CARE_PROVIDER_SITE_OTHER): Payer: 59 | Admitting: Family Medicine

## 2019-12-26 VITALS — BP 126/76 | HR 95 | Temp 98.1°F | Ht 62.0 in | Wt 254.0 lb

## 2019-12-26 DIAGNOSIS — E1165 Type 2 diabetes mellitus with hyperglycemia: Secondary | ICD-10-CM

## 2019-12-26 DIAGNOSIS — E559 Vitamin D deficiency, unspecified: Secondary | ICD-10-CM | POA: Diagnosis not present

## 2019-12-26 DIAGNOSIS — Z6841 Body Mass Index (BMI) 40.0 and over, adult: Secondary | ICD-10-CM

## 2019-12-26 DIAGNOSIS — Z9189 Other specified personal risk factors, not elsewhere classified: Secondary | ICD-10-CM

## 2019-12-26 MED ORDER — ATORVASTATIN CALCIUM 10 MG PO TABS: 10.0000 mg | ORAL_TABLET | Freq: Every day | ORAL | 0 refills | Status: DC

## 2019-12-26 MED ORDER — LISINOPRIL 5 MG PO TABS: 5.0000 mg | ORAL_TABLET | Freq: Every day | ORAL | 0 refills | Status: DC

## 2019-12-26 MED ORDER — VITAMIN D (ERGOCALCIFEROL) 1.25 MG (50000 UNIT) PO CAPS: 50000.0000 [IU] | ORAL_CAPSULE | ORAL | 0 refills | Status: DC

## 2019-12-26 MED ORDER — METFORMIN HCL 1000 MG PO TABS: 1000.0000 mg | ORAL_TABLET | Freq: Every day | ORAL | 0 refills | Status: DC

## 2019-12-30 NOTE — Progress Notes (Signed)
Chief Complaint:   OBESITY Jill [name] is here to discuss her progress with her obesity treatment plan along with follow-up of her obesity related diagnoses. Jill [name] is keeping a food journal and adhering to recommended goals of 1400-1500 calories and 90+ grams of protein and states she is following her eating plan approximately 99% of the time. Jill [name] states she is walking 15 minutes 5 times per week.  Today's visit was #: 5 Starting weight: 252 lbs Starting date: 10/23/2019 Today's weight: 254 lbs Today's date: 12/26/2019 Total lbs lost to date: 0 Total lbs lost since last in-office visit: 0  Interim History: Jill [name] reports the last few weeks were good. She stayed at her calories but voices she is eating more meat than vegetables. Blood sugars are ranging between 160 and 220. Protein intake is ranging from 50-96 grams. She is still eating out more frequently secondary to her kitchen at home not being very clean. Her plan is to get more on the plan the Sunday of Memorial Day weekend.  Subjective:   Type 2 diabetes mellitus with hyperglycemia, without long-term current use of insulin ([place]). Iaisha is on Ozempic weekly. Blood sugars are ranging between 160 and 220. She denies hypoglycemia.  Lab Results  Component Value Date   HGBA1C 11.1 (H) 10/23/2019   HGBA1C 6.8 (H) 04/08/2019   HGBA1C 8.2 (H) 02/20/2019   Lab Results  Component Value Date   MICROALBUR 1.9 04/08/2019   LDLCALC 155 (H) 10/23/2019   CREATININE 0.85 09/23/2019   Lab Results  Component Value Date   INSULIN 25.4 (H) 10/23/2019   Vitamin D deficiency. No nausea, vomiting, or muscle weakness. Jill [name] endorses fatigue. She is on prescription Vitamin D. Last Vitamin D 18.7 on 10/23/2019.  At risk for osteoporosis. Jill [name] is at higher risk of osteopenia and osteoporosis due to Vitamin D deficiency.   Assessment/Plan:   Type 2 diabetes mellitus with hyperglycemia, without long-term  current use of insulin ([place]).  Good blood sugar control is important to decrease the likelihood of diabetic complications such as nephropathy, neuropathy, limb loss, blindness, coronary artery disease, and death. Intensive lifestyle modification including diet, exercise and weight loss are the first line of treatment for diabetes. Clela will continue her current medications. Refills were given for metFORMIN (GLUCOPHAGE) 1000 MG tablet PO daily #30 with 0 refills, atorvastatin (LIPITOR) 10 MG tablet PO daily #30 with 0 refills, and lisinopril (ZESTRIL) 5 MG tablet PO daily #30 with 0 refills.  Vitamin D deficiency. Low Vitamin D level contributes to fatigue and are associated with obesity, breast, and colon cancer. She was given a refill on her Vitamin D, Ergocalciferol, (DRISDOL) 1.25 MG (50000 UNIT) CAPS capsule every week #4 with 0 refills and will follow-up for routine testing of Vitamin D, at least 2-3 times per year to avoid over-replacement.    At risk for osteoporosis. Jill [name] was given approximately 15 minutes of osteoporosis prevention counseling today. Jill [name] is at risk for osteopenia and osteoporosis due to her Vitamin D deficiency. She was encouraged to take her Vitamin D and follow her higher calcium diet and increase strengthening exercise to help strengthen her bones and decrease her risk of osteopenia and osteoporosis.  Repetitive spaced learning was employed today to elicit superior memory formation and behavioral change.  Class 3 severe obesity with serious comorbidity and body mass index (BMI) of 45.0 to 49.9 in adult, unspecified obesity type ([place]).  Marrie is currently in the action stage of change. As such,  her goal is to continue with weight loss efforts. She has agreed to keeping a food journal and adhering to recommended goals of 1400-1500 calories and 90+ grams of protein daily.   Exercise goals: For substantial health benefits, adults should do at least 150 minutes  (2 hours and 30 minutes) a week of moderate-intensity, or 75 minutes (1 hour and 15 minutes) a week of vigorous-intensity aerobic physical activity, or an equivalent combination of moderate- and vigorous-intensity aerobic activity. Aerobic activity should be performed in episodes of at least 10 minutes, and preferably, it should be spread throughout the week.  Behavioral modification strategies: increasing lean protein intake, meal planning and cooking strategies, keeping healthy foods in the home, planning for success and keeping a strict food journal.  Jill [name] has agreed to follow-up with our clinic in 2 weeks. She was informed of the importance of frequent follow-up visits to maximize her success with intensive lifestyle modifications for her multiple health conditions.   Objective:   Blood pressure 126/76, pulse 95, temperature 98.1 F (36.7 C), temperature source Oral, height 5\' 2"  (1.575 m), weight 254 lb (115.2 kg), SpO2 99 %. Body mass index is 46.46 kg/m.  General: Cooperative, alert, well developed, in no acute distress. HEENT: Conjunctivae and lids unremarkable. Cardiovascular: Regular rhythm.  Lungs: Normal work of breathing. Neurologic: No focal deficits.   Lab Results  Component Value Date   CREATININE 0.85 09/23/2019   BUN 13 09/23/2019   NA 138 09/23/2019   K 4.5 09/23/2019   CL 104 09/23/2019   CO2 22 09/23/2019   Lab Results  Component Value Date   ALT 14 04/08/2019   AST 10 04/08/2019   ALKPHOS 81 04/08/2019   BILITOT 0.4 04/08/2019   Lab Results  Component Value Date   HGBA1C 11.1 (H) 10/23/2019   HGBA1C 6.8 (H) 04/08/2019   HGBA1C 8.2 (H) 02/20/2019   HGBA1C 10.8 (H) 11/09/2018   HGBA1C 6.7 (H) 10/17/2017   Lab Results  Component Value Date   INSULIN 25.4 (H) 10/23/2019   Lab Results  Component Value Date   TSH 1.600 10/23/2019   Lab Results  Component Value Date   CHOL 216 (H) 10/23/2019   HDL 47 10/23/2019   LDLCALC 155 (H) 10/23/2019    LDLDIRECT 125 (H) 09/10/2014   TRIG 77 10/23/2019   CHOLHDL 5 04/08/2019   Lab Results  Component Value Date   WBC 7.2 09/23/2019   HGB 13.8 09/23/2019   HCT 41.9 09/23/2019   MCV 82 09/23/2019   PLT 187 09/23/2019   Lab Results  Component Value Date   IRON 40 (L) 06/27/2016   Attestation Statements:   Reviewed by clinician on day of visit: allergies, medications, problem list, medical history, surgical history, family history, social history, and previous encounter notes.  I, Michaelene Song, am acting as transcriptionist for Coralie Common, MD   I have reviewed the above documentation for accuracy and completeness, and I agree with the above. - Jinny Blossom, MD

## 2020-01-11 NOTE — Patient Instructions (Addendum)
  Blood work was ordered.     Medications reviewed and updated.  Changes include :   Decrease ozempic - 0.25 mg weekly x 4 weeks then 0.5 mg weekly.   Stop lisinopril and start losartan 25 mg daily.    Change metfomin to saxagliptin- metformin 2 pills dialy  Your prescription(s) have been submitted to your pharmacy. Please take as directed and contact our office if you believe you are having problem(s) with the medication(s).    Please followup in 3 months

## 2020-01-11 NOTE — Progress Notes (Signed)
Subjective:    Patient ID: Jill [name], female    DOB: August 25, 1960, 60 y.o.   MRN: [id number]  HPI The patient is here for follow up of their chronic medical problems, including DM, hypertension, hyperlipidemia   After taking ozempic she has a headache and stomach x 2 weeks.  She stopped the medication for couple of weeks and felt better.  She did retry the medication and did not feel well with not able to tolerate it.  She is experiencing a cough and thinks it may be from the lisinopril.  Medications and allergies reviewed with patient and updated if appropriate.  Patient Active Problem List   Diagnosis Date Noted  . Hypertension 01/13/2020  . Hyperlipidemia 01/13/2020  . COVID-19 virus infection 09/13/2019  . Vitamin D deficiency 04/08/2019  . Abdominal pain 11/09/2018  . Abnormal LFTs (liver function tests) 11/09/2018  . Constipation 05/29/2017  . Hemorrhoids 05/29/2017  . Fibroids, intramural 05/03/2016  . Pelvic kidney   . Uncontrolled diabetes mellitus ([place])   . OBESITY, MORBID 07/30/2009  . OBSTRUCTIVE SLEEP APNEA 05/19/2009  . MOTION SICKNESS 03/12/2007  . ASTHMA 01/31/2007  . GERD 01/31/2007    Current Outpatient Medications on File Prior to Visit  Medication Sig Dispense Refill  . atorvastatin (LIPITOR) 10 MG tablet Take 1 tablet (10 mg total) by mouth daily. 30 tablet 0  . Continuous Blood Gluc Receiver ([place]) DEVI 1 each by Does not apply route daily. Dx E11.65 1 each 1  . Iron-Vitamins (GERITOL TONIC PO) Take by mouth.    . Vitamin D, Ergocalciferol, (DRISDOL) 1.25 MG (50000 UNIT) CAPS capsule Take 1 capsule (50,000 Units total) by mouth every 7 (seven) days. 4 capsule 0   No current facility-administered medications on file prior to visit.    Past Medical History:  Diagnosis Date  . Allergy   . Anemia   . Arthritis   . Asthma    38yrs ago  . Chronic deafness of both ears    left ear has 40% hearing-right ear has 60 %  hearing  . Colon polyps   . Complication of anesthesia    Hard to wake up  . Constipation   . Diabetes mellitus without complication ([place])   . Heartburn   . High cholesterol   . Migraines    none in last 11 yrs  . Obesity   . Pelvic kidney    left  . Pneumonia   . Post partum depression   . Urinary incontinence     Past Surgical History:  Procedure Laterality Date  . CESAREAN SECTION    . COLONOSCOPY WITH PROPOFOL N/A 03/24/2015   Procedure: COLONOSCOPY WITH PROPOFOL;  Surgeon: Juanita Craver, MD;  Location: [facility];  Service: Endoscopy;  Laterality: N/A;  . HEMORRHOID BANDING  08/2017, 09/2017  . KNEE ARTHROSCOPY WITH MEDIAL MENISECTOMY Left 04/17/2014   Procedure: LEFT KNEE ARTHROSCOPY WITH PARTIAL MEDIAL MENISCECTOMY AND CHONDROPLASTY;  Surgeon: Johnn Hai, MD;  Location: [facility];  Service: Orthopedics;  Laterality: Left;  . TYMPANOSTOMY TUBE PLACEMENT Right     Social History   Socioeconomic History  . Marital status: Single    Spouse name: Not on file  . Number of children: 1  . Years of education: college   . Highest education level: Associate degree: academic program  Occupational History  . Occupation: social services case worker    Employer: [employer]  Tobacco Use  . Smoking status: Never Smoker  .  Smokeless tobacco: Never Used  Substance and Sexual Activity  . Alcohol use: Not Currently  . Drug use: No  . Sexual activity: Not Currently    Partners: Male    Birth control/protection: Abstinence, I.U.D.    Comment: Mirena placed 05/2016  Other Topics Concern  . Not on file  Social History Narrative   Single. Education: [employer].    Social Determinants of Health   Financial Resource Strain:   . Difficulty of Paying Living Expenses:   Food Insecurity:   . Worried About Charity fundraiser in the Last Year:   . Arboriculturist in the Last Year:   Transportation Needs:   . Film/video editor (Medical):   Marland Kitchen Lack of Transportation  (Non-Medical):   Physical Activity:   . Days of Exercise per Week:   . Minutes of Exercise per Session:   Stress:   . Feeling of Stress :   Social Connections:   . Frequency of Communication with Friends and Family:   . Frequency of Social Gatherings with Friends and Family:   . Attends Religious Services:   . Active Member of Clubs or Organizations:   . Attends Archivist Meetings:   Marland Kitchen Marital Status:     Family History  Problem Relation Age of Onset  . Hypertension Mother   . Arthritis Mother   . Hypercalcemia Mother   . Obesity Mother   . Hypertension Sister   . Deep vein thrombosis Sister   . Pulmonary embolism Sister   . Diabetes Maternal Grandmother   . Heart failure Maternal Grandmother   . Heart disease Maternal Grandmother   . Hyperlipidemia Maternal Grandmother   . Hypertension Maternal Grandmother   . Hypertension Maternal Grandfather   . Hyperlipidemia Maternal Grandfather   . Breast cancer Paternal Aunt        diagnosed in her 1's  . Breast cancer Paternal Grandmother     Review of Systems  Constitutional: Negative for chills and fever.  Respiratory: Positive for cough (dry). Negative for shortness of breath and wheezing.   Cardiovascular: Negative for chest pain, palpitations and leg swelling.  Neurological: Positive for headaches (with ozempic). Negative for light-headedness.       Objective:   Vitals:   01/13/20 0820  BP: 126/78  Pulse: 92  Temp: 98.3 F (36.8 C)  SpO2: 97%   BP Readings from Last 3 Encounters:  01/13/20 126/78  12/26/19 126/76  12/10/19 (!) 150/82   Wt Readings from Last 3 Encounters:  01/13/20 257 lb 12.8 oz (116.9 kg)  12/26/19 254 lb (115.2 kg)  12/10/19 254 lb (115.2 kg)   Body mass index is 47.15 kg/m.   Physical Exam    Constitutional: Appears well-developed and well-nourished. No distress.  HENT:  Head: Normocephalic and atraumatic.  Neck: Neck supple. No tracheal deviation present. No  thyromegaly present.  No cervical lymphadenopathy Cardiovascular: Normal rate, regular rhythm and normal heart sounds.   No murmur heard. No carotid bruit .  No edema Pulmonary/Chest: Effort normal and breath sounds normal. No respiratory distress. No has no wheezes. No rales.  Skin: Skin is warm and dry. Not diaphoretic.  Psychiatric: Normal mood and affect. Behavior is normal.      Assessment & Plan:    See Problem List for Assessment and Plan of chronic medical problems.    This visit occurred during the SARS-CoV-2 public health emergency.  Safety protocols were in place, including screening questions prior to the visit,  additional usage of staff PPE, and extensive cleaning of exam room while observing appropriate contact time as indicated for disinfecting solutions.

## 2020-01-13 ENCOUNTER — Ambulatory Visit: Payer: 59 | Admitting: Internal Medicine

## 2020-01-13 ENCOUNTER — Encounter: Payer: Self-pay | Admitting: Internal Medicine

## 2020-01-13 ENCOUNTER — Other Ambulatory Visit: Payer: Self-pay

## 2020-01-13 VITALS — BP 126/78 | HR 92 | Temp 98.3°F | Ht 62.0 in | Wt 257.8 lb

## 2020-01-13 DIAGNOSIS — E785 Hyperlipidemia, unspecified: Secondary | ICD-10-CM | POA: Insufficient documentation

## 2020-01-13 DIAGNOSIS — E1165 Type 2 diabetes mellitus with hyperglycemia: Secondary | ICD-10-CM

## 2020-01-13 DIAGNOSIS — I10 Essential (primary) hypertension: Secondary | ICD-10-CM

## 2020-01-13 DIAGNOSIS — E1159 Type 2 diabetes mellitus with other circulatory complications: Secondary | ICD-10-CM | POA: Insufficient documentation

## 2020-01-13 DIAGNOSIS — E1169 Type 2 diabetes mellitus with other specified complication: Secondary | ICD-10-CM | POA: Insufficient documentation

## 2020-01-13 DIAGNOSIS — E782 Mixed hyperlipidemia: Secondary | ICD-10-CM

## 2020-01-13 DIAGNOSIS — Z794 Long term (current) use of insulin: Secondary | ICD-10-CM

## 2020-01-13 LAB — COMPREHENSIVE METABOLIC PANEL
ALT: 19 U/L (ref 0–35)
AST: 12 U/L (ref 0–37)
Albumin: 4.2 g/dL (ref 3.5–5.2)
Alkaline Phosphatase: 104 U/L (ref 39–117)
BUN: 14 mg/dL (ref 6–23)
CO2: 29 mEq/L (ref 19–32)
Calcium: 10.7 mg/dL — ABNORMAL HIGH (ref 8.4–10.5)
Chloride: 103 mEq/L (ref 96–112)
Creatinine, Ser: 0.93 mg/dL (ref 0.40–1.20)
GFR: 74.98 mL/min (ref >60.00)
Glucose, Bld: 278 mg/dL — ABNORMAL HIGH (ref 70–99)
Potassium: 4.1 mEq/L (ref 3.5–5.1)
Sodium: 137 mEq/L (ref 135–145)
Total Bilirubin: 0.4 mg/dL (ref 0.2–1.2)
Total Protein: 7.6 g/dL (ref 6.0–8.3)

## 2020-01-13 LAB — LIPID PANEL
Cholesterol: 141 mg/dL (ref 0–200)
HDL: 47.5 mg/dL (ref >39.00)
LDL Cholesterol: 85 mg/dL (ref 0–99)
NonHDL: 93.5
Total CHOL/HDL Ratio: 3
Triglycerides: 45 mg/dL (ref 0.0–149.0)
VLDL: 9 mg/dL (ref 0.0–40.0)

## 2020-01-13 LAB — HEMOGLOBIN A1C: Hgb A1c MFr Bld: 10 % — ABNORMAL HIGH (ref 4.6–6.5)

## 2020-01-13 MED ORDER — DEXCOM G6 TRANSMITTER MISC: 0 refills | Status: DC

## 2020-01-13 MED ORDER — SAXAGLIPTIN-METFORMIN ER 5-1000 MG PO TB24: 2.0000 | ORAL_TABLET | Freq: Every day | ORAL | 5 refills | Status: DC

## 2020-01-13 MED ORDER — DEXCOM G6 SENSOR MISC: 5 refills | Status: DC

## 2020-01-13 MED ORDER — OZEMPIC (0.25 OR 0.5 MG/DOSE) 2 MG/1.5ML ~~LOC~~ SOPN
PEN_INJECTOR | SUBCUTANEOUS | 5 refills | Status: DC
Start: 2020-01-13 — End: 2020-02-06

## 2020-01-13 MED ORDER — LOSARTAN POTASSIUM 25 MG PO TABS: 25.0000 mg | ORAL_TABLET | Freq: Every day | ORAL | 5 refills | Status: DC

## 2020-01-13 NOTE — Assessment & Plan Note (Signed)
Chronic Did not tolerate Crestor Currently taking atorvastatin 10 mg daily-continue Check lipids, CMP Working on weight loss Encouraged regular exercise, healthy diet

## 2020-01-13 NOTE — Assessment & Plan Note (Addendum)
Started on lisinopril for elevated BP -has been experiencing a cough Discontinue lisinopril-start losartan 25 mg daily Stressed weight loss, low sugar diet, regular exercise CMP

## 2020-01-13 NOTE — Assessment & Plan Note (Addendum)
Chronic Uncontrolled Stressed lifestyle changes, which she is working on She did not tolerate Ozempic 1 mg weekly-discussed options.  We will try 0.5 mg weekly to see if she tolerates that Change Metformin 1000 mg daily to saxagliptin-Metformin 05-1999 mg daily A1c

## 2020-01-13 NOTE — Assessment & Plan Note (Signed)
Chronic Following with healthy [facility] Encouraged regular exercise Stressed low sugar/carbohydrate diet, decrease portions

## 2020-01-14 ENCOUNTER — Encounter (INDEPENDENT_AMBULATORY_CARE_PROVIDER_SITE_OTHER): Payer: Self-pay | Admitting: Family Medicine

## 2020-01-14 ENCOUNTER — Ambulatory Visit (INDEPENDENT_AMBULATORY_CARE_PROVIDER_SITE_OTHER): Payer: 59 | Admitting: Family Medicine

## 2020-01-14 VITALS — BP 124/81 | HR 78 | Temp 97.9°F | Ht 62.0 in | Wt 255.0 lb

## 2020-01-14 DIAGNOSIS — Z6841 Body Mass Index (BMI) 40.0 and over, adult: Secondary | ICD-10-CM

## 2020-01-14 DIAGNOSIS — I10 Essential (primary) hypertension: Secondary | ICD-10-CM | POA: Diagnosis not present

## 2020-01-14 DIAGNOSIS — E1165 Type 2 diabetes mellitus with hyperglycemia: Secondary | ICD-10-CM

## 2020-01-14 NOTE — Progress Notes (Signed)
Chief Complaint:   OBESITY Jill [name] is here to discuss her progress with her obesity treatment plan along with follow-up of her obesity related diagnoses. Jill [name] is keeping a food journal and adhering to recommended goals of 1400-1500 calories and 90+ grams of protein and states she is following her eating plan approximately 50% of the time. Jill [name] states she is exercising 0 minutes 0 times per week.  Today's visit was #: 6 Starting weight: 252 lbs Starting date: 10/23/2019 Today's weight: 255 lbs Today's date: 01/14/2020 Total lbs lost to date: 0 Total lbs lost since last in-office visit: 0  Interim History: Jill [name] voices some issues with Ozempic and accompanying GI side effects. She was unable to eat much over the last few weeks, so she has been drinking almost exclusively juice. She is having a minimal drive to eat.  Subjective:   Type 2 diabetes mellitus with hyperglycemia, without long-term current use of insulin ([place]). Jill [name] reports she has only been able to drink juice. She does note improvement in abdominal pain with the decrease in the Ozempic dose. She does not have Dexcom currently.   Lab Results  Component Value Date   HGBA1C 10.0 (H) 01/13/2020   HGBA1C 11.1 (H) 10/23/2019   HGBA1C 6.8 (H) 04/08/2019   Lab Results  Component Value Date   MICROALBUR 1.9 04/08/2019   LDLCALC 85 01/13/2020   CREATININE 0.93 01/13/2020   Lab Results  Component Value Date   INSULIN 25.4 (H) 10/23/2019   Essential hypertension. Blood pressure is well controlled today. No chest pain, chest pressure, or headache. [place] switched from lisinopril to losartan secondary to cough.  BP Readings from Last 3 Encounters:  01/14/20 124/81  01/13/20 126/78  12/26/19 126/76   Lab Results  Component Value Date   CREATININE 0.93 01/13/2020   CREATININE 0.85 09/23/2019   CREATININE 1.04 04/08/2019    Assessment/Plan:   Type 2 diabetes mellitus with  hyperglycemia, without long-term current use of insulin ([place]). Good blood sugar control is important to decrease the likelihood of diabetic complications such as nephropathy, neuropathy, limb loss, blindness, coronary artery disease, and death. Intensive lifestyle modification including diet, exercise and weight loss are the first line of treatment for diabetes. Jill [name] will have follow-up blood pressure in 2 weeks with the initiation of eating as GI upset resolves.  Essential hypertension. Jill [name] is working on healthy weight loss and exercise to improve blood pressure control. We will watch for signs of hypotension as she continues her lifestyle modifications. She will continue her current medication with no changes.  Class 3 severe obesity with serious comorbidity and body mass index (BMI) of 45.0 to 49.9 in adult, unspecified obesity type ([place]).  Jill [name] is currently in the action stage of change. As such, her goal is to continue with weight loss efforts. She has agreed to keeping a food journal and adhering to recommended goals of 1400-1500 calories and 90+ grams of protein daily.   Exercise goals: No exercise has been prescribed at this time.  Behavioral modification strategies: increasing lean protein intake, increasing vegetables, meal planning and cooking strategies and keeping healthy foods in the home.  Jill [name] has agreed to follow-up with our clinic in 2-3 weeks. She was informed of the importance of frequent follow-up visits to maximize her success with intensive lifestyle modifications for her multiple health conditions.   Objective:   Blood pressure 124/81, pulse 78, temperature 97.9 F (36.6 C), temperature source Oral, height 5\' 2"  (1.575 m),  weight 255 lb (115.7 kg), SpO2 99 %. Body mass index is 46.64 kg/m.  General: Cooperative, alert, well developed, in no acute distress. HEENT: Conjunctivae and lids unremarkable. Cardiovascular: Regular rhythm.  Lungs: Normal  work of breathing. Neurologic: No focal deficits.   Lab Results  Component Value Date   CREATININE 0.93 01/13/2020   BUN 14 01/13/2020   NA 137 01/13/2020   K 4.1 01/13/2020   CL 103 01/13/2020   CO2 29 01/13/2020   Lab Results  Component Value Date   ALT 19 01/13/2020   AST 12 01/13/2020   ALKPHOS 104 01/13/2020   BILITOT 0.4 01/13/2020   Lab Results  Component Value Date   HGBA1C 10.0 (H) 01/13/2020   HGBA1C 11.1 (H) 10/23/2019   HGBA1C 6.8 (H) 04/08/2019   HGBA1C 8.2 (H) 02/20/2019   HGBA1C 10.8 (H) 11/09/2018   Lab Results  Component Value Date   INSULIN 25.4 (H) 10/23/2019   Lab Results  Component Value Date   TSH 1.600 10/23/2019   Lab Results  Component Value Date   CHOL 141 01/13/2020   HDL 47.50 01/13/2020   LDLCALC 85 01/13/2020   LDLDIRECT 125 (H) 09/10/2014   TRIG 45.0 01/13/2020   CHOLHDL 3 01/13/2020   Lab Results  Component Value Date   WBC 7.2 09/23/2019   HGB 13.8 09/23/2019   HCT 41.9 09/23/2019   MCV 82 09/23/2019   PLT 187 09/23/2019   Lab Results  Component Value Date   IRON 40 (L) 06/27/2016   Attestation Statements:   Reviewed by clinician on day of visit: allergies, medications, problem list, medical history, surgical history, family history, social history, and previous encounter notes.  Time spent on visit including pre-visit chart review and post-visit charting and care was 15 minutes.   I, Michaelene Song, am acting as transcriptionist for Coralie Common, MD   I have reviewed the above documentation for accuracy and completeness, and I agree with the above. - Jinny Blossom, MD

## 2020-01-15 ENCOUNTER — Ambulatory Visit: Payer: 59 | Admitting: Family Medicine

## 2020-01-15 ENCOUNTER — Ambulatory Visit (INDEPENDENT_AMBULATORY_CARE_PROVIDER_SITE_OTHER): Payer: 59

## 2020-01-15 ENCOUNTER — Encounter: Payer: Self-pay | Admitting: Family Medicine

## 2020-01-15 ENCOUNTER — Other Ambulatory Visit: Payer: Self-pay

## 2020-01-15 VITALS — BP 138/80 | HR 86 | Ht 62.0 in | Wt 269.0 lb

## 2020-01-15 DIAGNOSIS — M25562 Pain in left knee: Secondary | ICD-10-CM

## 2020-01-15 DIAGNOSIS — M791 Myalgia, unspecified site: Secondary | ICD-10-CM

## 2020-01-15 NOTE — Progress Notes (Signed)
Subjective:    CC: L knee pain   I, Judy Pimple, am serving as a scribe for Dr. Lynne Leader.  HPI: Patient is a 59 year old female being seen today at [place] [facility] for L knee pain. States sometimes knee will give out and will become stiff. States recently she realized that it hurts worse while walking and the more she walks the more it hurts located to medial knee sore most of the time and every once in awhile sharp pain.  Additionally she notes some generalized muscle pain and aches.  She thinks this started about the same time she started on atorvastatin.  She also notes that she is intolerant of Crestor as a cause some GI side effects.  Radiating: no  Swelling: no  Mechanical: pops Tried: ibuprofen   Pertinent review of Systems: No fevers or chills  Relevant historical information: has had surgery on her L knee 10 years ago. Diabetes   Objective:    Vitals:   01/15/20 1545  BP: 138/80  Pulse: 86  SpO2: 99%   General: Well Developed, well nourished, and in no acute distress.   MSK: Left knee mild effusion. Mildly tender palpation medial joint line. Normal range of motion. Stable ligaments exam. Negative Murray's test.  Lab and Radiology Results EXAM: LEFT KNEE 3 VIEWS  COMPARISON:  None.  FINDINGS: Mild degenerative changes are seen. No joint effusion is noted. No acute fracture or dislocation is seen.  IMPRESSION: Degenerative change without acute bony abnormality.   Electronically Signed   By: Inez Catalina M.D.   On: 01/16/2020 14:00 I, Lynne Leader, personally (independently) visualized and performed the interpretation of the images attached in this note.   Diagnostic Limited MSK Ultrasound of: Left knee  Quad tendon intact normal-appearing. Trace joint effusion present superior patellar space. Patellar tendon intact.  Hypoechoic fluid tracking along superficial portion of patellar tendon. Medial and lateral  joint lines are narrowed with degenerative with absent or extruded appearing meniscus. Impression: DJD.  Procedure: Real-time Ultrasound Guided Injection of left knee lateral superior patellar space Device: Philips Affiniti 50G Images permanently stored and available for review in the ultrasound unit. Verbal informed consent obtained.  Discussed risks and benefits of procedure. Warned about infection bleeding damage to structures skin hypopigmentation and fat atrophy among others. Patient expresses understanding and agreement Time-out conducted.   Noted no overlying erythema, induration, or other signs of local infection.   Skin prepped in a sterile fashion.   Local anesthesia: Topical Ethyl chloride.   With sterile technique and under real time ultrasound guidance:  40 mg of Kenalog and 2 mL of Marcaine injected easily.   Completed without difficulty   Pain minimally resolved suggesting accurate placement of the medication.   Advised to call if fevers/chills, erythema, induration, drainage, or persistent bleeding.   Images permanently stored and available for review in the ultrasound unit.  Impression: Technically successful ultrasound guided injection.        Impression and Recommendations:    Assessment and Plan: 59 y.o. female with left knee pain.  Likely due to DJD.  Plan for injection as above along with Voltaren gel.  Recheck back in a few weeks if not improved.   Myalgia: Concern for statin myalgia.  Recommend trial off of atorvastatin for about 2 weeks and then restart.  If pain improves then returns we can be more certain that atorvastatin is the cause of aches and pains.  Consider pravastatin  or Livalo or Repatha as future options.  PDMP not reviewed this encounter. Orders Placed This Encounter  Procedures  . [place] LIMITED JOINT SPACE STRUCTURES LOW LEFT    Standing Status:   Future    Number of Occurrences:   1    Standing Expiration Date:   01/14/2021    Order Specific  Question:   Reason for Exam (SYMPTOM  OR DIAGNOSIS REQUIRED)    Answer:   Left knee pain    Order Specific Question:   Preferred imaging location?    Answer:   [facility]  . DG Knee AP/LAT W/Sunrise Left    Standing Status:   Future    Number of Occurrences:   1    Standing Expiration Date:   01/14/2021    Order Specific Question:   Reason for Exam (SYMPTOM  OR DIAGNOSIS REQUIRED)    Answer:   eval left knee pain    Order Specific Question:   Is patient pregnant?    Answer:   No    Order Specific Question:   Preferred imaging location?    Answer:   [facility]    Order Specific Question:   Radiology Contrast Protocol - do NOT remove file path    Answer:   \\[email or web address]   No orders of the defined types were placed in this encounter.   Discussed warning signs or symptoms. Please see discharge instructions. Patient expresses understanding.   The above documentation has been reviewed and is accurate and complete Lynne Leader, M.D.

## 2020-01-15 NOTE — Patient Instructions (Signed)
Thank you for coming in today. Get xray today.  Try over the counter voltaren gel on the knee up to 4x daily.   For muscle pain try stopping the atorvastatin for 2 weeks and then restart.  See if you muscle pain improves and then returns.  Alternatives for cholesterol control will be pravastatin or Livalo or Repatha.   Call or go to the ER if you develop a large red swollen joint with extreme pain or oozing puss.

## 2020-01-17 ENCOUNTER — Encounter: Payer: Self-pay | Admitting: Family Medicine

## 2020-01-17 ENCOUNTER — Encounter: Payer: Self-pay | Admitting: Internal Medicine

## 2020-01-17 NOTE — Progress Notes (Signed)
X-ray knee shows a little arthritis otherwise normal

## 2020-01-21 ENCOUNTER — Encounter: Payer: Self-pay | Admitting: Internal Medicine

## 2020-01-22 MED ORDER — PRAVASTATIN SODIUM 20 MG PO TABS
20.0000 mg | ORAL_TABLET | Freq: Every day | ORAL | 3 refills | Status: DC
Start: 2020-01-22 — End: 2020-04-14

## 2020-01-22 NOTE — Addendum Note (Signed)
Addended by: Binnie Rail on: 01/22/2020 12:53 PM   Modules accepted: Orders

## 2020-01-23 ENCOUNTER — Other Ambulatory Visit (INDEPENDENT_AMBULATORY_CARE_PROVIDER_SITE_OTHER): Payer: Self-pay | Admitting: Family Medicine

## 2020-01-23 DIAGNOSIS — E559 Vitamin D deficiency, unspecified: Secondary | ICD-10-CM

## 2020-01-25 ENCOUNTER — Other Ambulatory Visit (INDEPENDENT_AMBULATORY_CARE_PROVIDER_SITE_OTHER): Payer: Self-pay | Admitting: Family Medicine

## 2020-01-25 DIAGNOSIS — E1165 Type 2 diabetes mellitus with hyperglycemia: Secondary | ICD-10-CM

## 2020-01-27 LAB — HM DIABETES EYE EXAM

## 2020-02-03 ENCOUNTER — Ambulatory Visit (INDEPENDENT_AMBULATORY_CARE_PROVIDER_SITE_OTHER): Payer: 59 | Admitting: Family Medicine

## 2020-02-03 ENCOUNTER — Encounter (INDEPENDENT_AMBULATORY_CARE_PROVIDER_SITE_OTHER): Payer: Self-pay | Admitting: Family Medicine

## 2020-02-03 ENCOUNTER — Other Ambulatory Visit: Payer: Self-pay

## 2020-02-03 ENCOUNTER — Encounter (INDEPENDENT_AMBULATORY_CARE_PROVIDER_SITE_OTHER): Payer: Self-pay

## 2020-02-03 VITALS — BP 142/83 | HR 92 | Temp 99.0°F | Ht 62.0 in | Wt 256.0 lb

## 2020-02-03 DIAGNOSIS — I152 Hypertension secondary to endocrine disorders: Secondary | ICD-10-CM

## 2020-02-03 DIAGNOSIS — E1165 Type 2 diabetes mellitus with hyperglycemia: Secondary | ICD-10-CM | POA: Diagnosis not present

## 2020-02-03 DIAGNOSIS — Z6841 Body Mass Index (BMI) 40.0 and over, adult: Secondary | ICD-10-CM

## 2020-02-03 DIAGNOSIS — E559 Vitamin D deficiency, unspecified: Secondary | ICD-10-CM

## 2020-02-03 DIAGNOSIS — E1159 Type 2 diabetes mellitus with other circulatory complications: Secondary | ICD-10-CM | POA: Diagnosis not present

## 2020-02-03 DIAGNOSIS — Z9189 Other specified personal risk factors, not elsewhere classified: Secondary | ICD-10-CM

## 2020-02-03 DIAGNOSIS — I10 Essential (primary) hypertension: Secondary | ICD-10-CM

## 2020-02-03 MED ORDER — VITAMIN D (ERGOCALCIFEROL) 1.25 MG (50000 UNIT) PO CAPS: 50000.0000 [IU] | ORAL_CAPSULE | ORAL | 0 refills | Status: DC

## 2020-02-04 ENCOUNTER — Encounter: Payer: Self-pay | Admitting: Internal Medicine

## 2020-02-04 DIAGNOSIS — T7840XA Allergy, unspecified, initial encounter: Secondary | ICD-10-CM

## 2020-02-04 DIAGNOSIS — E1165 Type 2 diabetes mellitus with hyperglycemia: Secondary | ICD-10-CM

## 2020-02-04 NOTE — Progress Notes (Signed)
Chief Complaint:   OBESITY Jill [name] is here to discuss her progress with her obesity treatment plan along with follow-up of her obesity related diagnoses. Jill [name] is on keeping a food journal and adhering to recommended goals of 1400-1500 calories and 90+ grams of protein and states she is following her eating plan approximately 70% of the time. Jill [name] states she is exercising for 0 minutes 0 times per week.  Today's visit was #: 7 Starting weight: 252 lbs Starting date: 10/23/2019 Today's weight: 256 lbs Today's date: 02/03/2020 Total lbs lost to date: 0 Total lbs lost since last in-office visit: 0  Interim History: Jill [name] started on saxagliptin-metformin 2 weeks ago per PCP.  Previously on metformin alone.  Could not tolerate Ozempic, even at low doses.  She has not taken Ozempic in 1 week.  She plans to call her PCP regarding update on Ozempic.    In July 2018, she was walking for 30 minutes per day.  A1c was 5.6 and 5.9 at that time.  She only drinks 48 ounces of water per day now and hurt so much with prior statin that she could not walk.  She recently started on a different statin with no side effects.    Per patient, the Category 3 plan she started on initially worked better for her than journaling.  She has felt she has gained weight/stayed the same with journaling.  Subjective:   1. Type 2 diabetes mellitus with hyperglycemia, without long-term current use of insulin (Jill [name]) Jill [name] cannot tolerate the Ozempic that her PCP started her on several visits ago.  We encouraged her to half her dose, but it still caused her too much GI upset and upset stomach.  Lab Results  Component Value Date   HGBA1C 10.0 (H) 01/13/2020   HGBA1C 11.1 (H) 10/23/2019   HGBA1C 6.8 (H) 04/08/2019   Lab Results  Component Value Date   MICROALBUR 1.9 04/08/2019   LDLCALC 85 01/13/2020   CREATININE 0.93 01/13/2020   Lab Results  Component Value Date   INSULIN 25.4 (H) 10/23/2019    2. Vitamin D deficiency Jill [name]'s Vitamin D level was 18.7 on 10/23/2019. She is currently taking prescription vitamin D 50,000 IU each week. She denies nausea, vomiting or muscle weakness.  Tolerating well with no side effects.  She says she has more energy and is less achy than prior.  3. Hypertension associated with diabetes (Jill [name]) Jill [name] is taking losartan daily and is tolerating it well with no issues.  Blood pressure usually runs 120s/70s at home.    BP Readings from Last 3 Encounters:  02/03/20 (!) 142/83  01/15/20 138/80  01/14/20 124/81   4. At risk for nausea Jill [name] is at risk for nausea due to very high blood sugar and gastroparesis.  Assessment/Plan:   1. Type 2 diabetes mellitus with hyperglycemia, without long-term current use of insulin (Jill [name]) Verneal should return to PCPs office regarding further management of her diabetes, which is poorly controlled.  Continue prudent nutritional plan and weight loss.  2. Vitamin D deficiency Low Vitamin D level contributes to fatigue and are associated with obesity, breast, and colon cancer. She agrees to continue to take prescription Vitamin D @50 ,000 IU every week and will follow-up for routine testing of Vitamin D, at least 2-3 times per year to avoid over-replacement.  Continue prudent nutritional plan and weight loss. - Vitamin D, Ergocalciferol, (DRISDOL) 1.25 MG (50000 UNIT) CAPS capsule; Take 1 capsule (50,000 Units total) by mouth every 7 (  seven) days.  Dispense: 4 capsule; Refill: 0  3. Hypertension associated with diabetes (Jill [name]) Jill [name] is working on healthy weight loss and exercise to improve blood pressure control. We will watch for signs of hypotension as she continues her lifestyle modifications.  Blood pressure increased from prior. Reviewed blood pressure goal with her today (<130/80).  She will check at home and bring monitor.  Continue medications, prudent nutritional plan, and weight loss.  4. At risk for  nausea Jill [name] was given approximately 15 minutes of nausea prevention counseling today. Jill [name] is at risk for nausea due to her new or current medication. She was encouraged to titrate her medication slowly, make sure to stay hydrated, eat smaller portions throughout the day, and avoid high fat meals.   5. Class 3 severe obesity with serious comorbidity and body mass index (BMI) of 45.0 to 49.9 in adult, unspecified obesity type (Jill [name]) Jill [name] is currently in the action stage of change. As such, her goal is to continue with weight loss efforts. She has agreed to the Category 3 Plan.   Exercise goals: All adults should avoid inactivity. Some physical activity is better than none, and adults who participate in any amount of physical activity gain some health benefits.  Behavioral modification strategies: increasing lean protein intake, decreasing simple carbohydrates, increasing water intake and planning for success.  Jill [name] has agreed to follow-up with our clinic in 2 weeks. She was informed of the importance of frequent follow-up visits to maximize her success with intensive lifestyle modifications for her multiple health conditions.   Objective:   Blood pressure (!) 142/83, pulse 92, temperature 99 F (37.2 C), temperature source Oral, height 5\' 2"  (1.575 m), weight 256 lb (116.1 kg), SpO2 98 %. Body mass index is 46.82 kg/m.  General: Cooperative, alert, well developed, in no acute distress. HEENT: Conjunctivae and lids unremarkable. Cardiovascular: Regular rhythm.  Lungs: Normal work of breathing. Neurologic: No focal deficits.   Lab Results  Component Value Date   CREATININE 0.93 01/13/2020   BUN 14 01/13/2020   NA 137 01/13/2020   K 4.1 01/13/2020   CL 103 01/13/2020   CO2 29 01/13/2020   Lab Results  Component Value Date   ALT 19 01/13/2020   AST 12 01/13/2020   ALKPHOS 104 01/13/2020   BILITOT 0.4 01/13/2020   Lab Results  Component Value Date     HGBA1C 10.0 (H) 01/13/2020   HGBA1C 11.1 (H) 10/23/2019   HGBA1C 6.8 (H) 04/08/2019   HGBA1C 8.2 (H) 02/20/2019   HGBA1C 10.8 (H) 11/09/2018   Lab Results  Component Value Date   INSULIN 25.4 (H) 10/23/2019   Lab Results  Component Value Date   TSH 1.600 10/23/2019   Lab Results  Component Value Date   CHOL 141 01/13/2020   HDL 47.50 01/13/2020   LDLCALC 85 01/13/2020   LDLDIRECT 125 (H) 09/10/2014   TRIG 45.0 01/13/2020   CHOLHDL 3 01/13/2020   Lab Results  Component Value Date   WBC 7.2 09/23/2019   HGB 13.8 09/23/2019   HCT 41.9 09/23/2019   MCV 82 09/23/2019   PLT 187 09/23/2019   Lab Results  Component Value Date   IRON 40 (L) 06/27/2016   Attestation Statements:   Reviewed by clinician on day of visit: allergies, medications, problem list, medical history, surgical history, family history, social history, and previous encounter notes.  I, Water quality scientist, CMA, am acting as Location manager for [employer], DO.  I have reviewed the above  documentation for accuracy and completeness, and I agree with the above. Mellody Dance, DO

## 2020-02-06 MED ORDER — FREESTYLE LIBRE 2 SENSOR MISC: 5 refills | Status: DC

## 2020-02-06 MED ORDER — FREESTYLE LIBRE 2 READER DEVI: 0 refills | Status: DC

## 2020-02-07 MED ORDER — RYBELSUS 3 MG PO TABS: 3.0000 mg | ORAL_TABLET | Freq: Every day | ORAL | 5 refills | Status: DC

## 2020-02-07 NOTE — Addendum Note (Signed)
Addended by: Binnie Rail on: 02/07/2020 07:38 AM   Modules accepted: Orders

## 2020-02-18 ENCOUNTER — Other Ambulatory Visit: Payer: Self-pay

## 2020-02-18 ENCOUNTER — Encounter (INDEPENDENT_AMBULATORY_CARE_PROVIDER_SITE_OTHER): Payer: Self-pay | Admitting: Family Medicine

## 2020-02-18 ENCOUNTER — Ambulatory Visit (INDEPENDENT_AMBULATORY_CARE_PROVIDER_SITE_OTHER): Payer: 59 | Admitting: Family Medicine

## 2020-02-18 VITALS — BP 139/84 | HR 90 | Temp 98.3°F | Ht 62.0 in | Wt 252.0 lb

## 2020-02-18 DIAGNOSIS — Z6841 Body Mass Index (BMI) 40.0 and over, adult: Secondary | ICD-10-CM

## 2020-02-18 DIAGNOSIS — E559 Vitamin D deficiency, unspecified: Secondary | ICD-10-CM

## 2020-02-18 DIAGNOSIS — E1165 Type 2 diabetes mellitus with hyperglycemia: Secondary | ICD-10-CM

## 2020-02-19 NOTE — Telephone Encounter (Signed)
Please advise 

## 2020-02-24 ENCOUNTER — Encounter: Payer: Self-pay | Admitting: Allergy & Immunology

## 2020-02-24 ENCOUNTER — Other Ambulatory Visit: Payer: Self-pay

## 2020-02-24 ENCOUNTER — Ambulatory Visit: Payer: 59 | Admitting: Allergy & Immunology

## 2020-02-24 VITALS — BP 124/82 | HR 94 | Temp 98.8°F | Resp 16 | Ht 62.0 in | Wt 258.2 lb

## 2020-02-24 DIAGNOSIS — H1010 Acute atopic conjunctivitis, unspecified eye: Secondary | ICD-10-CM | POA: Diagnosis not present

## 2020-02-24 DIAGNOSIS — L299 Pruritus, unspecified: Secondary | ICD-10-CM | POA: Diagnosis not present

## 2020-02-24 DIAGNOSIS — K9049 Malabsorption due to intolerance, not elsewhere classified: Secondary | ICD-10-CM

## 2020-02-24 NOTE — Patient Instructions (Addendum)
1. Pruritus - Testing was only positive to ash tree pollen, but was otherwise negative to the entire environmental allergy panel. - I am going to hold off on labs and just start cetirizine 10mg  TWICE daily to see if this helps with your symptoms. - Continue with the use of your moisturizing lotion. - We may consider labs at the next visit if there is no improvement.  2. Food intolerance - Testing was negative to the most common foods. - We could consider confirming this with blood work at the next visit.  3. Return in about 6 weeks (around 04/06/2020). This can be an in-person, a virtual Webex or a telephone follow up visit.   Please inform us of any Emergency Department visits, hospitalizations, or changes in symptoms. Call us before going to the ED for breathing or allergy symptoms since we might be able to fit you in for a sick visit. Feel free to contact us anytime with any questions, problems, or concerns.  It was a pleasure to meet you today!  Websites that have reliable patient information: 1. American Academy of Asthma, Allergy, and Immunology: www.aaaai.org 2. Food Allergy Research and Education (FARE): foodallergy.org 3. Mothers of Asthmatics: http://www.asthmacommunitynetwork.org 4. American College of Allergy, Asthma, and Immunology: www.acaai.org   COVID-19 Vaccine Information can be found at: ShippingScam.co.uk For questions related to vaccine distribution or appointments, please email vaccine@[email or web address] .com or call [phone].     Like [place] on [place] and Instagram for our latest updates!        Make sure you are registered to vote! If you have moved or changed any of your contact information, you will need to get this updated before voting!  In some cases, you MAY be able to register to vote online: [email or web address]     Reducing Pollen Exposure  The American Academy  of Allergy, Asthma and Immunology suggests the following steps to reduce your exposure to pollen during allergy seasons.    1. Do not hang sheets or clothing out to dry; pollen may collect on these items. 2. Do not mow lawns or spend time around freshly cut grass; mowing stirs up pollen. 3. Keep windows closed at night.  Keep car windows closed while driving. 4. Minimize morning activities outdoors, a time when pollen counts are usually at their highest. 5. Stay indoors as much as possible when pollen counts or humidity is high and on windy days when pollen tends to remain in the air longer. 6. Use air conditioning when possible.  Many air conditioners have filters that trap the pollen spores. 7. Use a HEPA room air filter to remove pollen form the indoor air you breathe.

## 2020-02-24 NOTE — Progress Notes (Signed)
NEW PATIENT  Date of Service/Encounter:  02/24/20  Referring provider: Binnie Rail, MD   Assessment:   Pruritus - Plan: Allergy Test, Interdermal Allergy Test  Food intolerance - Plan: Allergy Test  Plan/Recommendations:    Patient Instructions  1. Pruritus - Testing was only positive to ash tree pollen, but was otherwise negative to the entire environmental allergy panel. - I am going to hold off on labs and just start cetirizine 10mg  TWICE daily to see if this helps with your symptoms. - Continue with the use of your moisturizing lotion. - We may consider labs at the next visit if there is no improvement.  2. Food intolerance - Testing was negative to the most common foods. - We could consider confirming this with blood work at the next visit.  3. Return in about 6 weeks (around 04/06/2020). This can be an in-person, a virtual Webex or a telephone follow up visit.   Please inform us of any Emergency Department visits, hospitalizations, or changes in symptoms. Call us before going to the ED for breathing or allergy symptoms since we might be able to fit you in for a sick visit. Feel free to contact us anytime with any questions, problems, or concerns.  It was a pleasure to meet you today!  Websites that have reliable patient information: 1. American Academy of Asthma, Allergy, and Immunology: www.aaaai.org 2. Food Allergy Research and Education (FARE): foodallergy.org 3. Mothers of Asthmatics: http://www.asthmacommunitynetwork.org 4. American College of Allergy, Asthma, and Immunology: www.acaai.org   COVID-19 Vaccine Information can be found at: ShippingScam.co.uk For questions related to vaccine distribution or appointments, please email vaccine@[email or web address] .com or call [phone].     "Like" [place] on Facebook and Instagram for our latest updates!        Make sure you are registered to vote! If you  have moved or changed any of your contact information, you will need to get this updated before voting!  In some cases, you MAY be able to register to vote online: [email or web address]     Reducing Pollen Exposure  The American Academy of Allergy, Asthma and Immunology suggests the following steps to reduce your exposure to pollen during allergy seasons.    1. Do not hang sheets or clothing out to dry; pollen may collect on these items. 2. Do not mow lawns or spend time around freshly cut grass; mowing stirs up pollen. 3. Keep windows closed at night.  Keep car windows closed while driving. 4. Minimize morning activities outdoors, a time when pollen counts are usually at their highest. 5. Stay indoors as much as possible when pollen counts or humidity is high and on windy days when pollen tends to remain in the air longer. 6. Use air conditioning when possible.  Many air conditioners have filters that trap the pollen spores. 7. Use a HEPA room air filter to remove pollen form the indoor air you breathe.       Subjective:   Jill [name] is a 59 y.o. female presenting today for evaluation of  Chief Complaint  Patient presents with  . Pruritus    Jill [name] has a history of the following: Patient Active Problem List   Diagnosis Date Noted  . Hypertension 01/13/2020  . Hyperlipidemia 01/13/2020  . COVID-19 virus infection 09/13/2019  . Vitamin D deficiency 04/08/2019  . Abdominal pain 11/09/2018  . Abnormal LFTs (liver function tests) 11/09/2018  . Constipation 05/29/2017  . Hemorrhoids 05/29/2017  . Fibroids, intramural 05/03/2016  .  Pelvic kidney   . Uncontrolled diabetes mellitus ([place])   . OBESITY, MORBID 07/30/2009  . OBSTRUCTIVE SLEEP APNEA 05/19/2009  . MOTION SICKNESS 03/12/2007  . ASTHMA 01/31/2007  . GERD 01/31/2007    History obtained from: chart review and patient.  Jill [name] was referred  by Binnie Rail, MD.     Jill [name] is a 59 y.o. female presenting for an evaluation of persistent itching.  She has been itching for years. She has been using triamcinolone cream with some relief. She is using it daily. She saw Dr. Ronnald Ramp and her last visit was two years ago. He is at [place] at [facility]. She never had a rash per say and has never had a biopsy at all. She does have skin that "does not exfoliate". She does not think that this is related to any medications since it has been around for so long.   There is never a rash at all. Typically it is just itchy skin. She does have some bleeding when she scratches quite a bit.  She denies any eczema, urticaria, raised lesions, or really anything visual aside from scratch marks when she has intense episodes.  She does endorse some allergic rhinitis symptoms, but these are controlled with the use of antihistamines on a seasonal basis.  She did have a cough that was related to the ACEI. She switched and it improved. She does get an itchy throat that hurts, but otherwise no systemic symptoms. She has never been to the hospital for her symptoms.   Otherwise, there is no history of other atopic diseases, including asthma, drug allergies, stinging insect allergies or contact dermatitis. There is no significant infectious history. Vaccinations are up to date.    Past Medical History: Patient Active Problem List   Diagnosis Date Noted  . Hypertension 01/13/2020  . Hyperlipidemia 01/13/2020  . COVID-19 virus infection 09/13/2019  . Vitamin D deficiency 04/08/2019  . Abdominal pain 11/09/2018  . Abnormal LFTs (liver function tests) 11/09/2018  . Constipation 05/29/2017  . Hemorrhoids 05/29/2017  . Fibroids, intramural 05/03/2016  . Pelvic kidney   . Uncontrolled diabetes mellitus ([place])   . OBESITY, MORBID 07/30/2009  . OBSTRUCTIVE SLEEP APNEA 05/19/2009  . MOTION SICKNESS 03/12/2007  . ASTHMA 01/31/2007  . GERD  01/31/2007    Medication List:  Allergies as of 02/24/2020      Reactions   Linzess [linaclotide] Other (See Comments)   Abdominal pain    Adhesive [tape]    Pulls skin off   Atorvastatin    Muscle aches, stomach upset   Crestor [rosuvastatin]    Stomach pain   Latex Itching   Ozempic (0.25 Or 0.5 Mg-dose) [semaglutide(0.25 Or 0.5mg -dos)]    Stomach pain   Toradol [ketorolac Tromethamine]    Joint pain and swelling   Tramadol Other (See Comments)   JOINT PAIN      Medication List       Accurate as of February 24, 2020  4:40 PM. If you have any questions, ask your nurse or doctor.        FreeStyle Libre 2 Reader Devi UAD to check sugars.  Dx NIDDM, E11.65   GERITOL TONIC PO Take by mouth.   losartan 25 MG tablet Commonly known as: Cozaar Take 1 tablet (25 mg total) by mouth daily.   pravastatin 20 MG tablet Commonly known as: PRAVACHOL Take 1 tablet (20 mg total) by mouth daily.   Rybelsus 3 MG Tabs Generic drug: Semaglutide  Take 3 mg by mouth daily.   Saxagliptin-Metformin 12-998 MG Tb24 Take 2 tablets by mouth daily with supper.   Vitamin D (Ergocalciferol) 1.25 MG (50000 UNIT) Caps capsule Commonly known as: DRISDOL Take 1 capsule (50,000 Units total) by mouth every 7 (seven) days.       Birth History: non-contributory  Developmental History: non-contributory  Past Surgical History: Past Surgical History:  Procedure Laterality Date  . CESAREAN SECTION    . COLONOSCOPY WITH PROPOFOL N/A 03/24/2015   Procedure: COLONOSCOPY WITH PROPOFOL;  Surgeon: Juanita Craver, MD;  Location: [facility];  Service: Endoscopy;  Laterality: N/A;  . HEMORRHOID BANDING  08/2017, 09/2017  . KNEE ARTHROSCOPY WITH MEDIAL MENISECTOMY Left 04/17/2014   Procedure: LEFT KNEE ARTHROSCOPY WITH PARTIAL MEDIAL MENISCECTOMY AND CHONDROPLASTY;  Surgeon: Johnn Hai, MD;  Location: [facility];  Service: Orthopedics;  Laterality: Left;  . TYMPANOSTOMY TUBE PLACEMENT Right      Family  History: Family History  Problem Relation Age of Onset  . Hypertension Mother   . Arthritis Mother   . Hypercalcemia Mother   . Obesity Mother   . Allergic rhinitis Mother   . Asthma Mother   . Hypertension Sister   . Deep vein thrombosis Sister   . Pulmonary embolism Sister   . Asthma Daughter   . Diabetes Maternal Grandmother   . Heart failure Maternal Grandmother   . Heart disease Maternal Grandmother   . Hyperlipidemia Maternal Grandmother   . Hypertension Maternal Grandmother   . Hypertension Maternal Grandfather   . Hyperlipidemia Maternal Grandfather   . Breast cancer Paternal Aunt        diagnosed in her 54's  . Breast cancer Paternal Grandmother      Social History: Neetu lives in a house that is 62+ years old.  There is carpeting throughout the home.  She has gas heating and central cooling.  There are no animals inside or outside of the home.  She does not have dust mite covers on the bedding.  There is no tobacco exposure.  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  She has done this for 27 years.  She does have a HEPA filter in the home.  She is not exposed to fumes or chemicals.  Review of Systems  Constitutional: Negative.  Negative for chills, fever, malaise/fatigue and weight loss.  HENT: Negative.  Negative for congestion, ear discharge, ear pain and sore throat.        Positive for intermittent sneezing.  Eyes: Negative for pain, discharge and redness.  Respiratory: Negative for cough, sputum production, shortness of breath and wheezing.   Cardiovascular: Negative.  Negative for chest pain and palpitations.  Gastrointestinal: Negative for abdominal pain, constipation, diarrhea, heartburn, nausea and vomiting.  Skin: Positive for itching. Negative for rash.  Neurological: Negative for dizziness and headaches.  Endo/Heme/Allergies: Positive for environmental allergies. Does not bruise/bleed  easily.       Objective:   Blood pressure 124/82, pulse 94, temperature 98.8 F (37.1 C), temperature source Temporal, resp. rate 16, height 5\' 2"  (1.575 m), weight 258 lb 3.2 oz (117.1 kg), SpO2 96 %. Body mass index is 47.23 kg/m.   Physical Exam:   Physical Exam Constitutional:      Appearance: She is well-developed.     Comments: Obese talkative female.  Cooperative with the exam.  HENT:     Head: Normocephalic and atraumatic.     Right Ear: Tympanic membrane,  ear canal and external ear normal. No drainage, swelling or tenderness. Tympanic membrane is not injected, scarred, erythematous, retracted or bulging.     Left Ear: Tympanic membrane, ear canal and external ear normal. No drainage, swelling or tenderness. Tympanic membrane is not injected, scarred, erythematous, retracted or bulging.     Nose: No nasal deformity, septal deviation, mucosal edema or rhinorrhea.     Right Turbinates: Enlarged. Not swollen or pale.     Left Turbinates: Enlarged. Not swollen or pale.     Right Sinus: No maxillary sinus tenderness or frontal sinus tenderness.     Left Sinus: No maxillary sinus tenderness or frontal sinus tenderness.     Mouth/Throat:     Mouth: Mucous membranes are not pale and not dry.     Pharynx: Uvula midline.  Eyes:     General:        Right eye: No discharge.        Left eye: No discharge.     Conjunctiva/sclera: Conjunctivae normal.     Right eye: Right conjunctiva is not injected. No chemosis.    Left eye: Left conjunctiva is not injected. No chemosis.    Pupils: Pupils are equal, round, and reactive to light.  Cardiovascular:     Rate and Rhythm: Normal rate and regular rhythm.     Heart sounds: Normal heart sounds.  Pulmonary:     Effort: Pulmonary effort is normal. No tachypnea, accessory muscle usage or respiratory distress.     Breath sounds: Normal breath sounds. No wheezing, rhonchi or rales.     Comments: Moving air well in all lung fields.  No  increased work of breathing. Chest:     Chest wall: No tenderness.  Abdominal:     Tenderness: There is no abdominal tenderness. There is no guarding or rebound.  Lymphadenopathy:     Head:     Right side of head: No submandibular, tonsillar or occipital adenopathy.     Left side of head: No submandibular, tonsillar or occipital adenopathy.     Cervical: No cervical adenopathy.  Skin:    Coloration: Skin is not pale.     Findings: No abrasion, erythema, petechiae or rash. Rash is not papular, urticarial or vesicular.     Comments: No eczematous or urticarial lesions noted.  She does have some isolated excoriation marks, but otherwise her skin exam is normal.  Neurological:     Mental Status: She is alert.      Diagnostic studies:     Allergy Studies:     Airborne Adult Perc - 02/24/20 1448    Time Antigen Placed 1448    Allergen Manufacturer Lavella Hammock    Location Back    Number of Test 59    Panel 1 Select    1. Control-Buffer 50% Glycerol Negative    2. Control-Histamine 1 mg/ml 2+    3. Albumin saline Negative    4. [place] Negative    5. [place] Negative    6. Johnson Negative    7. [place] Blue Negative    8. Meadow Fescue Negative    9. Perennial Rye Negative    10. Sweet Vernal Negative    11. Timothy Negative    12. Cocklebur Negative    13. Burweed Marshelder Negative    14. Ragweed, short Negative    15. Ragweed, Giant Negative    16. Plantain,  English Negative    17. Lamb's Quarters Negative    18. Sheep Sorrell Negative  19. Rough Pigweed Negative    20. Marsh Elder, Rough Negative    21. Mugwort, Common Negative    22. Ash mix 2+    23. Birch mix Negative    24. Beech American Negative    25. Box, Elder Negative    26. Cedar, red Negative    27. Cottonwood, [place] Negative    28. Elm mix Negative    29. Hickory Negative    30. Maple mix Negative    31. Oak, [place] mix Negative    32. Pecan Pollen Negative    33. Pine mix Negative    34.  Sycamore Eastern Negative    35. Padroni, Black Pollen Negative    36. Alternaria alternata Negative    37. Cladosporium Herbarum Negative    38. Aspergillus mix Negative    39. Penicillium mix Negative    40. Bipolaris sorokiniana (Helminthosporium) Negative    41. Drechslera spicifera (Curvularia) Negative    42. Mucor plumbeus Negative    43. Fusarium moniliforme Negative    44. Aureobasidium pullulans (pullulara) Negative    45. Rhizopus oryzae Negative    46. Botrytis cinera Negative    47. Epicoccum nigrum Negative    48. Phoma betae Negative    49. Candida Albicans Negative    50. Trichophyton mentagrophytes Negative    51. Mite, D Farinae  5,000 AU/ml Negative    52. Mite, D Pteronyssinus  5,000 AU/ml Negative    53. Cat Hair 10,000 BAU/ml Negative    54.  Dog Epithelia Negative    55. Mixed Feathers Negative    56. Horse Epithelia Negative    57. Cockroach, German Negative    58. Mouse Negative    59. Tobacco Leaf Negative          Food Perc - 02/24/20 1449      Test Information   Time Antigen Placed 1449    Allergen Manufacturer Lavella Hammock    Location Back    Number of allergen test [place]   1. Peanut Negative    2. Soybean food Negative    3. Wheat, whole Negative    4. Sesame Negative    5. Milk, cow Negative    6. Egg White, chicken Negative    7. Casein Negative    8. Shellfish mix Negative    9. Fish mix Negative    10. Cashew Negative          Intradermal - 02/24/20 1517    Time Antigen Placed 1517    Allergen Manufacturer Lavella Hammock    Location Arm    Number of Test 14    Intradermal Select    Control Negative    [place] Negative    Johnson Negative    7 Grass Negative    Ragweed mix Negative    Weed mix Negative    Mold 1 Negative    Mold 2 Negative    Mold 3 Negative    Mold 4 Negative    Cat Negative    Dog Negative    Cockroach Negative    Mite mix Negative                     Salvatore Marvel,  MD [facility]

## 2020-02-24 NOTE — Progress Notes (Signed)
Chief Complaint:   OBESITY Jill [name] is here to discuss her progress with her obesity treatment plan along with follow-up of her obesity related diagnoses. Jill [name] is on the Category 3 Plan and states she is following her eating plan approximately 100% of the time. Jill [name] states she is walking for 15 minutes 2 times per week.  Today's visit was #: 8 Starting weight: 252 lbs Starting date: 10/23/2019 Today's weight: 252 lbs Today's date: 02/18/2020 Total lbs lost to date: 0 Total lbs lost since last in-office visit: 2  Interim History: Jill [name] stopped Ozempic and has increased Saxagliptin-metformin and got the Jill [name] for glucose monitoring. She did go out Friday night as she has been feeling better. She went back to her Category 3 as she felt this was easier than journaling.  Subjective:   1. Type 2 diabetes mellitus with hyperglycemia, without long-term current use of insulin (HCC) Jill [name] had 2 lows of 58 and 61, but she was asleep. She is feeling well otherwise. She is on Rybelsus, and Saxagliptin-metformin. Last A1c was 10.0.  2. Vitamin D deficiency Jill [name]'s last Vit D level was 18.7. She notes fatigue, and denies nausea, vomiting, or muscle weakness.  Assessment/Plan:   1. Type 2 diabetes mellitus with hyperglycemia, without long-term current use of insulin (HCC) Good blood sugar control is important to decrease the likelihood of diabetic complications such as nephropathy, neuropathy, limb loss, blindness, coronary artery disease, and death. Intensive lifestyle modification including diet, exercise and weight loss are the first line of treatment for diabetes. Jill [name] will continue using Libre, and we will review her BGs at her next appointment.  2. Vitamin D deficiency Low Vitamin D level contributes to fatigue and are associated with obesity, breast, and colon cancer. Jill [name] agreed to continue taking prescription Vitamin D 50,000 IU every week and will  follow-up for routine testing of Vitamin D, at least 2-3 times per year to avoid over-replacement. We will recheck labs in late September.  3. Class 3 severe obesity with serious comorbidity and body mass index (BMI) of 45.0 to 49.9 in adult, unspecified obesity type (HCC) Jill [name] is currently in the action stage of change. As such, her goal is to continue with weight loss efforts. She has agreed to the Category 3 Plan.   Exercise goals: As is.  Behavioral modification strategies: increasing lean protein intake, increasing vegetables, meal planning and cooking strategies and keeping healthy foods in the home.  Jill [name] has agreed to follow-up with our clinic in 2 weeks. She was informed of the importance of frequent follow-up visits to maximize her success with intensive lifestyle modifications for her multiple health conditions.   Objective:   Blood pressure 139/84, pulse 90, temperature 98.3 F (36.8 C), temperature source Oral, height 5\' 2"  (1.575 m), weight 252 lb (114.3 kg), SpO2 97 %. Body mass index is 46.09 kg/m.  General: Cooperative, alert, well developed, in no acute distress. HEENT: Conjunctivae and lids unremarkable. Cardiovascular: Regular rhythm.  Lungs: Normal work of breathing. Neurologic: No focal deficits.   Lab Results  Component Value Date   CREATININE 0.93 01/13/2020   BUN 14 01/13/2020   NA 137 01/13/2020   K 4.1 01/13/2020   CL 103 01/13/2020   CO2 29 01/13/2020   Lab Results  Component Value Date   ALT 19 01/13/2020   AST 12 01/13/2020   ALKPHOS 104 01/13/2020   BILITOT 0.4 01/13/2020   Lab Results  Component Value Date   HGBA1C 10.0 (H) 01/13/2020  HGBA1C 11.1 (H) 10/23/2019   HGBA1C 6.8 (H) 04/08/2019   HGBA1C 8.2 (H) 02/20/2019   HGBA1C 10.8 (H) 11/09/2018   Lab Results  Component Value Date   INSULIN 25.4 (H) 10/23/2019   Lab Results  Component Value Date   TSH 1.600 10/23/2019   Lab Results  Component Value Date   CHOL 141  01/13/2020   HDL 47.50 01/13/2020   LDLCALC 85 01/13/2020   LDLDIRECT 125 (H) 09/10/2014   TRIG 45.0 01/13/2020   CHOLHDL 3 01/13/2020   Lab Results  Component Value Date   WBC 7.2 09/23/2019   HGB 13.8 09/23/2019   HCT 41.9 09/23/2019   MCV 82 09/23/2019   PLT 187 09/23/2019   Lab Results  Component Value Date   IRON 40 (L) 06/27/2016   Attestation Statements:   Reviewed by clinician on day of visit: allergies, medications, problem list, medical history, surgical history, family history, social history, and previous encounter notes.  Time spent on visit including pre-visit chart review and post-visit care and charting was 15 minutes.    I, Trixie Dredge, am acting as transcriptionist for Coralie Common, MD.  I have reviewed the above documentation for accuracy and completeness, and I agree with the above. - Jinny Blossom, MD

## 2020-02-25 ENCOUNTER — Encounter: Payer: Self-pay | Admitting: Allergy & Immunology

## 2020-03-02 ENCOUNTER — Encounter (INDEPENDENT_AMBULATORY_CARE_PROVIDER_SITE_OTHER): Payer: Self-pay | Admitting: Family Medicine

## 2020-03-02 ENCOUNTER — Other Ambulatory Visit: Payer: Self-pay

## 2020-03-02 ENCOUNTER — Ambulatory Visit (INDEPENDENT_AMBULATORY_CARE_PROVIDER_SITE_OTHER): Payer: 59 | Admitting: Family Medicine

## 2020-03-02 VITALS — BP 137/80 | HR 93 | Temp 98.0°F | Ht 62.0 in | Wt 251.0 lb

## 2020-03-02 DIAGNOSIS — Z9189 Other specified personal risk factors, not elsewhere classified: Secondary | ICD-10-CM | POA: Diagnosis not present

## 2020-03-02 DIAGNOSIS — E1169 Type 2 diabetes mellitus with other specified complication: Secondary | ICD-10-CM

## 2020-03-02 DIAGNOSIS — E559 Vitamin D deficiency, unspecified: Secondary | ICD-10-CM

## 2020-03-02 DIAGNOSIS — Z6841 Body Mass Index (BMI) 40.0 and over, adult: Secondary | ICD-10-CM

## 2020-03-03 NOTE — Progress Notes (Signed)
Chief Complaint:   OBESITY Jill [name] is here to discuss her progress with her obesity treatment plan along with follow-up of her obesity related diagnoses. Jill [name] is on the Category 3 Plan and states she is following her eating plan approximately 50% of the time. Jill [name] states she is walking for 15 minutes 5 times per week.  Today's visit was #: 9 Starting weight: 252 lbs Starting date: 10/23/2019 Today's weight: 251 lbs Today's date: 03/02/2020 Total lbs lost to date: 1 lb Total lbs lost since last in-office visit: 1 lb  Interim History: Jill [name] says, "I'm a veggie person and I'm getting meats, but I'm getting tired of the select fruits.  I only like strawberries."  She is realizing that shen she eats fried foods, her blood sugars are really high postprandial or the next day.  She has a food journal.  She had a cheeseburger with bacon and onion rings one day, and on another day, had fried seafood and realized her blood sugar was really hight the next day.  She likes writing everything she eats down.  Subjective:   1. Type 2 diabetes mellitus with other specified complication, without long-term current use of insulin (HCC) Medications reviewed. Diabetic ROS: no polyuria or polydipsia, no chest pain, dyspnea or TIA's, no numbness, tingling or pain in extremities.  Two hour postprandial after fried fish and shrimp was 206.  She had only 1 low recently that she did not have symptoms with.  Her fasting blood sugars have been 75, 120, 113, and 114, witt the highest being in the 120s.  She is on Rybelsus and saxagliptin-metformin.  Lab Results  Component Value Date   HGBA1C 10.0 (H) 01/13/2020   HGBA1C 11.1 (H) 10/23/2019   HGBA1C 6.8 (H) 04/08/2019   Lab Results  Component Value Date   MICROALBUR 1.9 04/08/2019   LDLCALC 85 01/13/2020   CREATININE 0.93 01/13/2020   Lab Results  Component Value Date   INSULIN 25.4 (H) 10/23/2019   2. Vitamin D deficiency Jill [name]'s  Vitamin D level was 18.7 on 10/23/2019. She is currently taking prescription vitamin D 50,000 IU each week. She denies nausea, vomiting or muscle weakness.  3. At risk for hypoglycemia Jill [name] is at increased risk for hypoglycemia due to changes in diet, diagnosis of diabetes, and/or insulin use. Jill [name] is not currently taking insulin.   Assessment/Plan:   1. Type 2 diabetes mellitus with other specified complication, without long-term current use of insulin (HCC) Good blood sugar control is important to decrease the likelihood of diabetic complications such as nephropathy, neuropathy, limb loss, blindness, coronary artery disease, and death. Intensive lifestyle modification including diet, exercise and weight loss are the first line of treatment for diabetes.  Continue medications per PCP.  Will continue to closely monitor.  Continue prudent nutritional plan and weight loss.  2. Vitamin D deficiency Low Vitamin D level contributes to fatigue and are associated with obesity, breast, and colon cancer. She agrees to continue to take prescription Vitamin D @50 ,000 IU every week and will follow-up for routine testing of Vitamin D, at least 2-3 times per year to avoid over-replacement.  3. At risk for hypoglycemia Jill [name] was given approximately 15 minutes of counseling today regarding prevention of hypoglycemia. She was advised of symptoms of hypoglycemia. Jill [name] was instructed to avoid skipping meals, eat regular protein rich meals and schedule low calorie snacks as needed.   Repetitive spaced learning was employed today to elicit superior memory formation and behavioral change  4. Class 3 severe obesity with serious comorbidity and body mass index (BMI) of 45.0 to 49.9 in adult, unspecified obesity type (HCC) Jill [name] is currently in the action stage of change. As such, her goal is to continue with weight loss efforts. She has agreed to the Category 3 Plan.   Exercise goals: As  is.  Behavioral modification strategies: increasing lean protein intake, decreasing simple carbohydrates, decreasing eating out, meal planning and cooking strategies and planning for success.  Jill [name] has agreed to follow-up with our clinic in 2 weeks. She was informed of the importance of frequent follow-up visits to maximize her success with intensive lifestyle modifications for her multiple health conditions.   Objective:   Blood pressure (!) 137/80, pulse 93, temperature 98 F (36.7 C), height 5\' 2"  (1.575 m), weight (!) 251 lb (113.9 kg), SpO2 99 %. Body mass index is 45.91 kg/m.  General: Cooperative, alert, well developed, in no acute distress. HEENT: Conjunctivae and lids unremarkable. Cardiovascular: Regular rhythm.  Lungs: Normal work of breathing. Neurologic: No focal deficits.   Lab Results  Component Value Date   CREATININE 0.93 01/13/2020   BUN 14 01/13/2020   NA 137 01/13/2020   K 4.1 01/13/2020   CL 103 01/13/2020   CO2 29 01/13/2020   Lab Results  Component Value Date   ALT 19 01/13/2020   AST 12 01/13/2020   ALKPHOS 104 01/13/2020   BILITOT 0.4 01/13/2020   Lab Results  Component Value Date   HGBA1C 10.0 (H) 01/13/2020   HGBA1C 11.1 (H) 10/23/2019   HGBA1C 6.8 (H) 04/08/2019   HGBA1C 8.2 (H) 02/20/2019   HGBA1C 10.8 (H) 11/09/2018   Lab Results  Component Value Date   INSULIN 25.4 (H) 10/23/2019   Lab Results  Component Value Date   TSH 1.600 10/23/2019   Lab Results  Component Value Date   CHOL 141 01/13/2020   HDL 47.50 01/13/2020   LDLCALC 85 01/13/2020   LDLDIRECT 125 (H) 09/10/2014   TRIG 45.0 01/13/2020   CHOLHDL 3 01/13/2020   Lab Results  Component Value Date   WBC 7.2 09/23/2019   HGB 13.8 09/23/2019   HCT 41.9 09/23/2019   MCV 82 09/23/2019   PLT 187 09/23/2019   Lab Results  Component Value Date   IRON 40 (L) 06/27/2016   Attestation Statements:   Reviewed by clinician on day of visit: allergies, medications,  problem list, medical history, surgical history, family history, social history, and previous encounter notes.  I, Water quality scientist, CMA, am acting as Location manager for [employer], DO.  I have reviewed the above documentation for accuracy and completeness, and I agree with the above. Mellody Dance, DO

## 2020-03-19 ENCOUNTER — Ambulatory Visit (INDEPENDENT_AMBULATORY_CARE_PROVIDER_SITE_OTHER): Payer: 59 | Admitting: Family Medicine

## 2020-03-19 ENCOUNTER — Other Ambulatory Visit: Payer: Self-pay

## 2020-03-19 ENCOUNTER — Encounter (INDEPENDENT_AMBULATORY_CARE_PROVIDER_SITE_OTHER): Payer: Self-pay | Admitting: Family Medicine

## 2020-03-19 VITALS — BP 137/81 | HR 89 | Temp 98.3°F | Ht 62.0 in | Wt 246.0 lb

## 2020-03-19 DIAGNOSIS — Z9189 Other specified personal risk factors, not elsewhere classified: Secondary | ICD-10-CM | POA: Diagnosis not present

## 2020-03-19 DIAGNOSIS — E1165 Type 2 diabetes mellitus with hyperglycemia: Secondary | ICD-10-CM

## 2020-03-19 DIAGNOSIS — E559 Vitamin D deficiency, unspecified: Secondary | ICD-10-CM | POA: Diagnosis not present

## 2020-03-19 DIAGNOSIS — Z6841 Body Mass Index (BMI) 40.0 and over, adult: Secondary | ICD-10-CM

## 2020-03-19 MED ORDER — VITAMIN D (ERGOCALCIFEROL) 1.25 MG (50000 UNIT) PO CAPS: 50000.0000 [IU] | ORAL_CAPSULE | ORAL | 0 refills | Status: DC

## 2020-03-23 NOTE — Progress Notes (Signed)
Chief Complaint:   OBESITY Jill [name] is here to discuss her progress with her obesity treatment plan along with follow-up of her obesity related diagnoses. Jill [name] is on the Category 3 Plan and states she is following her eating plan approximately 100% of the time. Jill [name] states she is walking for 20 minutes 5 times per week.  Today's visit was #: 10 Starting weight: 252 lbs Starting date: 10/23/2019 Today's weight: 246 lbs Today's date: 03/19/2020 Total lbs lost to date: 6 Total lbs lost since last in-office visit: 5  Interim History: Jill [name] has really tried to stick to the plan the last few weeks. She voices that she also has been cleaning her house. Her birthday is coming up but she is planning a trip to [facility] in November.  Subjective:   1. Vitamin D deficiency Jill [name] denies nausea, vomiting, or muscle weakness, but she notes fatigue. She is on prescription Vit D, and last Vit D level was 18.7.  2. Type 2 diabetes mellitus with hyperglycemia, without long-term current use of insulin (HCC) Jill [name]'s blood sugars are better controlled. Her highest was 128, and lowest 52 (this was fasting). She is on Janumet, Rybelsus, statin, and ARB.  3. At risk for osteoporosis Jill [name] is at higher risk of osteopenia and osteoporosis due to Vitamin D deficiency.   Assessment/Plan:   1. Vitamin D deficiency Low Vitamin D level contributes to fatigue and are associated with obesity, breast, and colon cancer. We will refill prescription Vitamin D for 1 month. Jill [name] will follow-up for routine testing of Vitamin D, at least 2-3 times per year to avoid over-replacement.  - Vitamin D, Ergocalciferol, (DRISDOL) 1.25 MG (50000 UNIT) CAPS capsule; Take 1 capsule (50,000 Units total) by mouth every 7 (seven) days.  Dispense: 4 capsule; Refill: 0  2. Type 2 diabetes mellitus with hyperglycemia, without long-term current use of insulin (HCC) Good blood sugar control is important  to decrease the likelihood of diabetic complications such as nephropathy, neuropathy, limb loss, blindness, coronary artery disease, and death. Intensive lifestyle modification including diet, exercise and weight loss are the first line of treatment for diabetes. Jill [name] will continue her medications with no change in dosage.  3. At risk for osteoporosis Jill [name] was given approximately 15 minutes of osteoporosis prevention counseling today. Jill [name] is at risk for osteopenia and osteoporosis due to her Vitamin D deficiency. She was encouraged to take her Vitamin D and follow her higher calcium diet and increase strengthening exercise to help strengthen her bones and decrease her risk of osteopenia and osteoporosis.  Repetitive spaced learning was employed today to elicit superior memory formation and behavioral change.  4. Class 3 severe obesity with serious comorbidity and body mass index (BMI) of 45.0 to 49.9 in adult, unspecified obesity type (HCC) Jill [name] is currently in the action stage of change. As such, her goal is to continue with weight loss efforts. She has agreed to the Category 3 Plan.   Exercise goals: All adults should avoid inactivity. Some physical activity is better than none, and adults who participate in any amount of physical activity gain some health benefits.  Behavioral modification strategies: increasing lean protein intake, increasing vegetables, meal planning and cooking strategies and keeping healthy foods in the home.  Jill [name] has agreed to follow-up with our clinic in 2 to 3 weeks. She was informed of the importance of frequent follow-up visits to maximize her success with intensive lifestyle modifications for her multiple health conditions.   Objective:  Blood pressure 137/81, pulse 89, temperature 98.3 F (36.8 C), temperature source Oral, height 5\' 2"  (1.575 m), weight 246 lb (111.6 kg), SpO2 100 %. Body mass index is 44.99 kg/m.  General:  Cooperative, alert, well developed, in no acute distress. HEENT: Conjunctivae and lids unremarkable. Cardiovascular: Regular rhythm.  Lungs: Normal work of breathing. Neurologic: No focal deficits.   Lab Results  Component Value Date   CREATININE 0.93 01/13/2020   BUN 14 01/13/2020   NA 137 01/13/2020   K 4.1 01/13/2020   CL 103 01/13/2020   CO2 29 01/13/2020   Lab Results  Component Value Date   ALT 19 01/13/2020   AST 12 01/13/2020   ALKPHOS 104 01/13/2020   BILITOT 0.4 01/13/2020   Lab Results  Component Value Date   HGBA1C 10.0 (H) 01/13/2020   HGBA1C 11.1 (H) 10/23/2019   HGBA1C 6.8 (H) 04/08/2019   HGBA1C 8.2 (H) 02/20/2019   HGBA1C 10.8 (H) 11/09/2018   Lab Results  Component Value Date   INSULIN 25.4 (H) 10/23/2019   Lab Results  Component Value Date   TSH 1.600 10/23/2019   Lab Results  Component Value Date   CHOL 141 01/13/2020   HDL 47.50 01/13/2020   LDLCALC 85 01/13/2020   LDLDIRECT 125 (H) 09/10/2014   TRIG 45.0 01/13/2020   CHOLHDL 3 01/13/2020   Lab Results  Component Value Date   WBC 7.2 09/23/2019   HGB 13.8 09/23/2019   HCT 41.9 09/23/2019   MCV 82 09/23/2019   PLT 187 09/23/2019   Lab Results  Component Value Date   IRON 40 (L) 06/27/2016   Attestation Statements:   Reviewed by clinician on day of visit: allergies, medications, problem list, medical history, surgical history, family history, social history, and previous encounter notes.   I, Trixie Dredge, am acting as transcriptionist for Coralie Common, MD.  I have reviewed the above documentation for accuracy and completeness, and I agree with the above. - Jinny Blossom, MD

## 2020-04-02 ENCOUNTER — Ambulatory Visit (INDEPENDENT_AMBULATORY_CARE_PROVIDER_SITE_OTHER): Payer: 59 | Admitting: Family Medicine

## 2020-04-02 ENCOUNTER — Encounter (INDEPENDENT_AMBULATORY_CARE_PROVIDER_SITE_OTHER): Payer: Self-pay

## 2020-04-02 ENCOUNTER — Other Ambulatory Visit: Payer: Self-pay

## 2020-04-07 ENCOUNTER — Other Ambulatory Visit: Payer: Self-pay

## 2020-04-07 ENCOUNTER — Ambulatory Visit: Payer: 59 | Admitting: Allergy & Immunology

## 2020-04-07 ENCOUNTER — Encounter: Payer: Self-pay | Admitting: Allergy & Immunology

## 2020-04-07 VITALS — BP 140/60 | HR 89 | Temp 98.0°F | Resp 18

## 2020-04-07 DIAGNOSIS — L299 Pruritus, unspecified: Secondary | ICD-10-CM | POA: Diagnosis not present

## 2020-04-07 DIAGNOSIS — K9049 Malabsorption due to intolerance, not elsewhere classified: Secondary | ICD-10-CM | POA: Diagnosis not present

## 2020-04-07 MED ORDER — MONTELUKAST SODIUM 10 MG PO TABS: 10.0000 mg | ORAL_TABLET | Freq: Every day | ORAL | 5 refills | Status: DC

## 2020-04-07 MED ORDER — CETIRIZINE HCL 10 MG PO TABS: 10.0000 mg | ORAL_TABLET | Freq: Two times a day (BID) | ORAL | 5 refills | Status: DC

## 2020-04-07 NOTE — Progress Notes (Signed)
FOLLOW UP  Date of Service/Encounter:  04/07/20   Assessment:    Pruritus - somewhat improved  Food intolerance   Plan/Recommendations:   1. Pruritus - with sensitization to tree pollen - Continue with cetirizine 20mg  once daily to see if this helps with your symptoms. - Add on Singulair (montelukast) 10mg  daily.  - Continue with the use of your moisturizing lotion (Cerve compounded)  - We are going to get some labs to look for serious causes of itching.   2. Food intolerance - Testing was negative to the most common foods. - We could consider confirming this with blood work at the next visit.  3. Return in about 6 weeks (around 05/19/2020).   Subjective:   Jill [name] is a 59 y.o. female presenting today for follow up of  Chief Complaint  Patient presents with  . Follow-up  . Pruritus    much better but still itching    Jill [name] has a history of the following: Patient Active Problem List   Diagnosis Date Noted  . Hypertension 01/13/2020  . Hyperlipidemia 01/13/2020  . COVID-19 virus infection 09/13/2019  . Vitamin D deficiency 04/08/2019  . Abdominal pain 11/09/2018  . Abnormal LFTs (liver function tests) 11/09/2018  . Constipation 05/29/2017  . Hemorrhoids 05/29/2017  . Fibroids, intramural 05/03/2016  . Pelvic kidney   . Uncontrolled diabetes mellitus ([place])   . OBESITY, MORBID 07/30/2009  . OBSTRUCTIVE SLEEP APNEA 05/19/2009  . MOTION SICKNESS 03/12/2007  . ASTHMA 01/31/2007  . GERD 01/31/2007    History obtained from: chart review and patient.  Jill [name] is a 59 y.o. female presenting for a follow up visit.  She was last seen in July 2021 as a new patient.  At that time, she had testing that was positive only to ash pollen.  We decided to hold off on labs and try medications instead.  We started her cetirizine on 10 mg twice daily.  We also continued with her moisturizing lotion.  Testing was negative to the most common  foods.  We did discuss confirming this with blood work, but I just recommended adding them back into her diet.  In the interim, she has continued to have itching but it has gotten better with the cetirizine twice daily.  It is still present, however.  She does report that she had shrimp and then almonds and "itches like crazy".  She has been trying to add things back into her diet with questionable success.  Heat also makes the itching a lot worse.  She does not have any kind of rash including eczema or urticaria.  However, today she did not moisturize the neck and seeing her skin at baseline.  It is clearly ichthyotic but there are no other eczematous lesions.  Otherwise, there have been no changes to her past medical history, surgical history, family history, or social history.    Review of Systems  Constitutional: Negative.  Negative for chills, fever, malaise/fatigue and weight loss.  HENT: Negative.  Negative for congestion, ear discharge and ear pain.   Eyes: Negative for pain, discharge and redness.  Respiratory: Negative for cough, sputum production, shortness of breath and wheezing.   Cardiovascular: Negative.  Negative for chest pain and palpitations.  Gastrointestinal: Negative for abdominal pain, constipation, diarrhea, heartburn, nausea and vomiting.  Skin: Positive for itching. Negative for rash.  Neurological: Negative for dizziness and headaches.  Endo/Heme/Allergies: Negative for environmental allergies. Does not bruise/bleed easily.  Objective:   Blood pressure 140/60, pulse 89, temperature 98 F (36.7 C), temperature source Temporal, resp. rate 18, SpO2 99 %. There is no height or weight on file to calculate BMI.   Physical Exam:  Physical Exam Constitutional:      Appearance: She is well-developed.  HENT:     Head: Normocephalic and atraumatic.     Right Ear: Tympanic membrane, ear canal and external ear normal.     Left Ear: Tympanic membrane, ear canal  and external ear normal.     Nose: No nasal deformity, septal deviation, mucosal edema or rhinorrhea.     Right Turbinates: Enlarged and swollen.     Left Turbinates: Enlarged and swollen.     Right Sinus: No maxillary sinus tenderness or frontal sinus tenderness.     Left Sinus: No maxillary sinus tenderness or frontal sinus tenderness.     Mouth/Throat:     Mouth: Mucous membranes are not pale and not dry.     Pharynx: Uvula midline.  Eyes:     General:        Right eye: No discharge.        Left eye: No discharge.     Conjunctiva/sclera: Conjunctivae normal.     Right eye: Right conjunctiva is not injected. No chemosis.    Left eye: Left conjunctiva is not injected. No chemosis.    Pupils: Pupils are equal, round, and reactive to light.  Cardiovascular:     Rate and Rhythm: Normal rate and regular rhythm.     Heart sounds: Normal heart sounds.  Pulmonary:     Effort: Pulmonary effort is normal. No tachypnea, accessory muscle usage or respiratory distress.     Breath sounds: Normal breath sounds. No wheezing, rhonchi or rales.     Comments: Moving air well in all lung fields. Chest:     Chest wall: No tenderness.  Lymphadenopathy:     Cervical: No cervical adenopathy.  Skin:    Coloration: Skin is not pale.     Findings: No abrasion, erythema, petechiae or rash. Rash is not papular, urticarial or vesicular.     Comments: Very dry ichthyotic skin especially on the bilateral arms.  Neurological:     Mental Status: She is alert.      Diagnostic studies: labs sent instead     Salvatore Marvel, MD  [facility]

## 2020-04-07 NOTE — Patient Instructions (Addendum)
1. Pruritus - with sensitization to tree pollen - Continue with cetirizine 20mg  once daily to see if this helps with your symptoms. - Add on Singulair (montelukast) 10mg  daily.  - Continue with the use of your moisturizing lotion (Cerve compounded)  - We are going to get some labs to look for serious causes of itching.   2. Food intolerance - Testing was negative to the most common foods. - We could consider confirming this with blood work at the next visit.  3. Return in about 6 weeks (around 05/19/2020).    Please inform us of any Emergency Department visits, hospitalizations, or changes in symptoms. Call us before going to the ED for breathing or allergy symptoms since we might be able to fit you in for a sick visit. Feel free to contact us anytime with any questions, problems, or concerns.  It was a pleasure to see you again today!  Websites that have reliable patient information: 1. American Academy of Asthma, Allergy, and Immunology: www.aaaai.org 2. Food Allergy Research and Education (FARE): foodallergy.org 3. Mothers of Asthmatics: http://www.asthmacommunitynetwork.org 4. American College of Allergy, Asthma, and Immunology: www.acaai.org   COVID-19 Vaccine Information can be found at: ShippingScam.co.uk For questions related to vaccine distribution or appointments, please email vaccine@[email or web address] or call [phone].     Like [place] on [place] and Instagram for our latest updates!        Make sure you are registered to vote! If you have moved or changed any of your contact information, you will need to get this updated before voting!  In some cases, you MAY be able to register to vote online: [email or web address]

## 2020-04-08 ENCOUNTER — Encounter: Payer: Self-pay | Admitting: Allergy & Immunology

## 2020-04-09 ENCOUNTER — Telehealth: Payer: Self-pay | Admitting: Internal Medicine

## 2020-04-09 NOTE — Telephone Encounter (Signed)
Attempted to reach patient a few times this morning but line is busy.  Will try and reach her later. If she is still having symptoms she may need to be virtual next week.

## 2020-04-09 NOTE — Telephone Encounter (Signed)
Line is still busy

## 2020-04-09 NOTE — Telephone Encounter (Signed)
FYI: Patient had an appoint last week with weight management. When screened, she told them she was having taste issues. They didn't see her and advised her to get a covid test. Her test came back negative. She is back at work and now has vomiting and diarrhea.  Made a virtual appointment for 9.3.21, but wanted to know if she should come in.  She has an appointment with [facility] on 9.7.21 for her 39mo f/u  Please advise.

## 2020-04-10 ENCOUNTER — Telehealth (INDEPENDENT_AMBULATORY_CARE_PROVIDER_SITE_OTHER): Payer: 59 | Admitting: Family

## 2020-04-10 DIAGNOSIS — J019 Acute sinusitis, unspecified: Secondary | ICD-10-CM | POA: Diagnosis not present

## 2020-04-10 DIAGNOSIS — E1165 Type 2 diabetes mellitus with hyperglycemia: Secondary | ICD-10-CM

## 2020-04-10 DIAGNOSIS — R112 Nausea with vomiting, unspecified: Secondary | ICD-10-CM

## 2020-04-10 MED ORDER — CEFDINIR 300 MG PO CAPS: 300.0000 mg | ORAL_CAPSULE | Freq: Two times a day (BID) | ORAL | 0 refills | Status: DC

## 2020-04-10 NOTE — Progress Notes (Signed)
Jill [name] is a 59 y.o. female with the following history as recorded in EpicCare:  Patient Active Problem List   Diagnosis Date Noted  . Hypertension 01/13/2020  . Hyperlipidemia 01/13/2020  . COVID-19 virus infection 09/13/2019  . Vitamin D deficiency 04/08/2019  . Abdominal pain 11/09/2018  . Abnormal LFTs (liver function tests) 11/09/2018  . Constipation 05/29/2017  . Hemorrhoids 05/29/2017  . Fibroids, intramural 05/03/2016  . Pelvic kidney   . Uncontrolled diabetes mellitus ([place])   . OBESITY, MORBID 07/30/2009  . OBSTRUCTIVE SLEEP APNEA 05/19/2009  . MOTION SICKNESS 03/12/2007  . ASTHMA 01/31/2007  . GERD 01/31/2007    Current Outpatient Medications  Medication Sig Dispense Refill  . cetirizine (ZYRTEC) 10 MG tablet Take 1 tablet (10 mg total) by mouth 2 (two) times daily. 60 tablet 5  . Continuous Blood Gluc Receiver (FREESTYLE LIBRE 2 READER) DEVI UAD to check sugars.  Dx NIDDM, E11.65 1 each 0  . Iron-Vitamins (GERITOL TONIC PO) Take by mouth.    . losartan (COZAAR) 25 MG tablet Take 1 tablet (25 mg total) by mouth daily. 30 tablet 5  . montelukast (SINGULAIR) 10 MG tablet Take 1 tablet (10 mg total) by mouth at bedtime. 30 tablet 5  . pravastatin (PRAVACHOL) 20 MG tablet Take 1 tablet (20 mg total) by mouth daily. 30 tablet 3  . Saxagliptin-Metformin 12-998 MG TB24 Take 2 tablets by mouth daily with supper. 60 tablet 5  . Semaglutide (RYBELSUS) 3 MG TABS Take 3 mg by mouth daily. 30 tablet 5  . Vitamin D, Ergocalciferol, (DRISDOL) 1.25 MG (50000 UNIT) CAPS capsule Take 1 capsule (50,000 Units total) by mouth every 7 (seven) days. 4 capsule 0   No current facility-administered medications for this visit.    Allergies: Linzess [linaclotide], Adhesive [tape], Atorvastatin, Crestor [rosuvastatin], Latex, Ozempic (0.25 or 0.5 mg-dose) [semaglutide(0.25 or 0.5mg -dos)], Toradol [ketorolac tromethamine], and Tramadol  Past Medical History:  Diagnosis Date  .  Allergy   . Anemia   . Arthritis   . Asthma    33yrs ago  . Chronic deafness of both ears    left ear has 40% hearing-right ear has 60 % hearing  . Colon polyps   . Complication of anesthesia    Hard to wake up  . Constipation   . Diabetes mellitus without complication ([place])   . Heartburn   . High cholesterol   . Migraines    none in last 11 yrs  . Obesity   . Pelvic kidney    left  . Pneumonia   . Post partum depression   . Urinary incontinence     Past Surgical History:  Procedure Laterality Date  . CESAREAN SECTION    . COLONOSCOPY WITH PROPOFOL N/A 03/24/2015   Procedure: COLONOSCOPY WITH PROPOFOL;  Surgeon: Juanita Craver, MD;  Location: [facility];  Service: Endoscopy;  Laterality: N/A;  . HEMORRHOID BANDING  08/2017, 09/2017  . KNEE ARTHROSCOPY WITH MEDIAL MENISECTOMY Left 04/17/2014   Procedure: LEFT KNEE ARTHROSCOPY WITH PARTIAL MEDIAL MENISCECTOMY AND CHONDROPLASTY;  Surgeon: Johnn Hai, MD;  Location: [facility];  Service: Orthopedics;  Laterality: Left;  . TYMPANOSTOMY TUBE PLACEMENT Right     Family History  Problem Relation Age of Onset  . Hypertension Mother   . Arthritis Mother   . Hypercalcemia Mother   . Obesity Mother   . Allergic rhinitis Mother   . Asthma Mother   . Hypertension Sister   . Deep vein thrombosis Sister   .  Pulmonary embolism Sister   . Asthma Daughter   . Diabetes Maternal Grandmother   . Heart failure Maternal Grandmother   . Heart disease Maternal Grandmother   . Hyperlipidemia Maternal Grandmother   . Hypertension Maternal Grandmother   . Hypertension Maternal Grandfather   . Hyperlipidemia Maternal Grandfather   . Breast cancer Paternal Aunt        diagnosed in her 13's  . Breast cancer Paternal Grandmother     Social History   Tobacco Use  . Smoking status: Never Smoker  . Smokeless tobacco: Never Used  Substance Use Topics  . Alcohol use: Not Currently    Subjective:    I connected with Jill [name] on  04/10/20 at  9:20 AM EDT by a telephone call and verified that I am speaking with the correct person using two identifiers.   I discussed the limitations of evaluation and management by telemedicine and the availability of in person appointments. The patient expressed understanding and agreed to proceed. Provider in office/ patient is at home; provider and patient are only 2 people on video call.   Complaining of vomiting and diarrhea x 24 hours; is concerned that is could be related to her Rybelsus because the symptoms started immediately after taking the medication; has been on Rybelsus x 2 months with no prior difficulty;  Did have a negative COVID test on Tuesday of this week/ fully vaccinated;   Is also concerned that she may have a sinus infection; has been having "bad taste" in her mouth x 2-3 weeks; recently started Singulair from her allergist; has noticed that in the past 2 weeks, her blood sugar has been spiking; had previously done very well on current combination of medications;     Objective:  There were no vitals filed for this visit.   Lungs: Respirations unlabored; clear to auscultation bilaterally without wheeze, rales, rhonchi  Neurologic: Alert and oriented; speech intact; face symmetrical; moves all extremities well; CNII-XII intact without focal deficit   Assessment:  1. Nausea and vomiting, intractability of vomiting not specified, unspecified vomiting type   2. Uncontrolled type 2 diabetes mellitus with hyperglycemia ([place])   3. Acute sinusitis, recurrence not specified, unspecified location     Plan:  1. Suspect viral illness; BRAT diet; okay to hold Rybelsus for the next 24-48 hours but suspect medication is not source; assuming diarrhea resolves, start Rybelsus back on Monday; 2. Keep planned follow-up with her PCP for next week to re-check labs;  3. Agree this could explain the sudden spike in her blood sugars; will go ahead and treat for sinus infection and plan  to have her PCP re-check on Tuesday; Do not start antibiotics for 48 hours (plan to start Sunday);   Time spent 15 minutes  No follow-ups on file.  No orders of the defined types were placed in this encounter.   Requested Prescriptions    No prescriptions requested or ordered in this encounter

## 2020-04-12 DIAGNOSIS — L299 Pruritus, unspecified: Secondary | ICD-10-CM | POA: Insufficient documentation

## 2020-04-12 NOTE — Patient Instructions (Addendum)
  Blood work was ordered.     Medications reviewed and updated.  Changes include :   Start vitamin d 2000-3000 units daily.  After finishing the antibiotic and your stomach is ok - try restarting Rybelsus.      Please followup in 4  months

## 2020-04-12 NOTE — Progress Notes (Signed)
Subjective:    Patient ID: Jill [name], female    DOB: [birth year], 59 y.o.   MRN: [id number]  HPI The patient is here for follow up of their chronic medical problems, including DM, htn, hyperlipidemia, elevated ca, low vitamin d   She is taking all of her medications as prescribed.    She was seen last week for a viral infection, ? Sinus infection.  She is taking an antibiotic.     She is having stomach issues.  She has diarrhea.  She is unsure if it is from the antibiotic, rybelsus or a stomach bug.  She has not taken the rybelsus since last week - the pharmacy is currently out of it.     She sprained her ankle and has not been able to exercise for the past three weeks.    Medications and allergies reviewed with patient and updated if appropriate.  Patient Active Problem List   Diagnosis Date Noted  . Pruritus 04/12/2020  . Hypertension 01/13/2020  . Hyperlipidemia 01/13/2020  . COVID-19 virus infection 09/13/2019  . Vitamin D deficiency 04/08/2019  . Abdominal pain 11/09/2018  . Abnormal LFTs (liver function tests) 11/09/2018  . Constipation 05/29/2017  . Hemorrhoids 05/29/2017  . Fibroids, intramural 05/03/2016  . Pelvic kidney   . Uncontrolled diabetes mellitus ([place])   . OBESITY, MORBID 07/30/2009  . OBSTRUCTIVE SLEEP APNEA 05/19/2009  . MOTION SICKNESS 03/12/2007  . ASTHMA 01/31/2007  . GERD 01/31/2007    Current Outpatient Medications on File Prior to Visit  Medication Sig Dispense Refill  . cetirizine (ZYRTEC) 10 MG tablet Take 1 tablet (10 mg total) by mouth 2 (two) times daily. 60 tablet 5  . Continuous Blood Gluc Receiver (FREESTYLE LIBRE 2 READER) DEVI UAD to check sugars.  Dx NIDDM, E11.65 1 each 0  . Continuous Blood Gluc Sensor (FREESTYLE LIBRE 2 SENSOR) MISC USE AS DIRECTED TO CHECK SUGARS DX E11.65, NIDDM    . Iron-Vitamins (GERITOL TONIC PO) Take by mouth.    . losartan (COZAAR) 25 MG tablet Take 1 tablet (25 mg total) by mouth daily.  30 tablet 5  . montelukast (SINGULAIR) 10 MG tablet Take 1 tablet (10 mg total) by mouth at bedtime. 30 tablet 5  . pravastatin (PRAVACHOL) 20 MG tablet Take 1 tablet (20 mg total) by mouth daily. 30 tablet 3  . Saxagliptin-Metformin 12-998 MG TB24 Take 2 tablets by mouth daily with supper. 60 tablet 5  . Semaglutide (RYBELSUS) 3 MG TABS Take 3 mg by mouth daily. 30 tablet 5   No current facility-administered medications on file prior to visit.    Past Medical History:  Diagnosis Date  . Allergy   . Anemia   . Arthritis   . Asthma    33yrs ago  . Chronic deafness of both ears    left ear has 40% hearing-right ear has 60 % hearing  . Colon polyps   . Complication of anesthesia    Hard to wake up  . Constipation   . Diabetes mellitus without complication ([place])   . Heartburn   . High cholesterol   . Migraines    none in last 11 yrs  . Obesity   . Pelvic kidney    left  . Pneumonia   . Post partum depression   . Urinary incontinence     Past Surgical History:  Procedure Laterality Date  . CESAREAN SECTION    . COLONOSCOPY WITH PROPOFOL N/A 03/24/2015   Procedure:  COLONOSCOPY WITH PROPOFOL;  Surgeon: Juanita Craver, MD;  [facility];  Service: Endoscopy;  Laterality: N/A;  . HEMORRHOID BANDING  08/2017, 09/2017  . KNEE ARTHROSCOPY WITH MEDIAL MENISECTOMY Left 04/17/2014   Procedure: LEFT KNEE ARTHROSCOPY WITH PARTIAL MEDIAL MENISCECTOMY AND CHONDROPLASTY;  Surgeon: Johnn Hai, MD;  Location[facility];  Service: Orthopedics;  Laterality: Left;  . TYMPANOSTOMY TUBE PLACEMENT Right     Social History   Socioeconomic History  . Marital status: Single    Spouse name: Not on file  . Number of children: 1  . Years of education: college   . Highest education level: Associate degree: academic program  Occupational History  . Occupation: social services case worker    Employer: [employer]  Tobacco Use  . Smoking status: Never Smoker  . Smokeless tobacco: Never  Used  Vaping Use  . Vaping Use: Never used  Substance and Sexual Activity  . Alcohol use: Not Currently  . Drug use: No  . Sexual activity: Not Currently    Partners: Male    Birth control/protection: Abstinence, I.U.D.    Comment: Mirena placed 05/2016  Other Topics Concern  . Not on file  Social History Narrative   Single. Education: [employer].    Social Determinants of Health   Financial Resource Strain:   . Difficulty of Paying Living Expenses: Not on file  Food Insecurity:   . Worried About Charity fundraiser in the Last Year: Not on file  . Ran Out of Food in the Last Year: Not on file  Transportation Needs:   . Lack of Transportation (Medical): Not on file  . Lack of Transportation (Non-Medical): Not on file  Physical Activity:   . Days of Exercise per Week: Not on file  . Minutes of Exercise per Session: Not on file  Stress:   . Feeling of Stress : Not on file  Social Connections:   . Frequency of Communication with Friends and Family: Not on file  . Frequency of Social Gatherings with Friends and Family: Not on file  . Attends Religious Services: Not on file  . Active Member of Clubs or Organizations: Not on file  . Attends Archivist Meetings: Not on file  . Marital Status: Not on file    Family History  Problem Relation Age of Onset  . Hypertension Mother   . Arthritis Mother   . Hypercalcemia Mother   . Obesity Mother   . Allergic rhinitis Mother   . Asthma Mother   . Hypertension Sister   . Deep vein thrombosis Sister   . Pulmonary embolism Sister   . Asthma Daughter   . Diabetes Maternal Grandmother   . Heart failure Maternal Grandmother   . Heart disease Maternal Grandmother   . Hyperlipidemia Maternal Grandmother   . Hypertension Maternal Grandmother   . Hypertension Maternal Grandfather   . Hyperlipidemia Maternal Grandfather   . Breast cancer Paternal Aunt        diagnosed in her 39's  . Breast cancer Paternal Grandmother      Review of Systems  Constitutional: Negative for chills and fever.  HENT: Negative for postnasal drip, sinus pressure and sinus pain.   Respiratory: Negative for cough, shortness of breath and wheezing.   Cardiovascular: Negative for chest pain, palpitations and leg swelling.  Gastrointestinal:       Rare gerd  Neurological: Negative for dizziness, light-headedness and headaches.       Objective:   Vitals:  04/14/20 0814  BP: 128/72  Pulse: 86  Temp: 98.6 F (37 C)  SpO2: 95%   BP Readings from Last 3 Encounters:  04/14/20 128/72  04/07/20 140/60  03/19/20 137/81   Wt Readings from Last 3 Encounters:  04/14/20 252 lb 3.2 oz (114.4 kg)  03/19/20 246 lb (111.6 kg)  03/02/20 (!) 251 lb (113.9 kg)   Body mass index is 46.13 kg/m.   Physical Exam    Constitutional: Appears well-developed and well-nourished. No distress.  HENT:  Head: Normocephalic and atraumatic.  Neck: Neck supple. No tracheal deviation present. No thyromegaly present.  No cervical lymphadenopathy Cardiovascular: Normal rate, regular rhythm and normal heart sounds.   No murmur heard. No carotid bruit .  No edema Pulmonary/Chest: Effort normal and breath sounds normal. No respiratory distress. No has no wheezes. No rales.  Skin: Skin is warm and dry. Not diaphoretic.  Psychiatric: Normal mood and affect. Behavior is normal.      Assessment & Plan:    See Problem List for Assessment and Plan of chronic medical problems.    This visit occurred during the SARS-CoV-2 public health emergency.  Safety protocols were in place, including screening questions prior to the visit, additional usage of staff PPE, and extensive cleaning of exam room while observing appropriate contact time as indicated for disinfecting solutions.

## 2020-04-14 ENCOUNTER — Ambulatory Visit: Payer: 59 | Admitting: Internal Medicine

## 2020-04-14 ENCOUNTER — Other Ambulatory Visit: Payer: Self-pay

## 2020-04-14 ENCOUNTER — Encounter: Payer: Self-pay | Admitting: Internal Medicine

## 2020-04-14 VITALS — BP 128/72 | HR 86 | Temp 98.6°F | Wt 252.2 lb

## 2020-04-14 DIAGNOSIS — E782 Mixed hyperlipidemia: Secondary | ICD-10-CM

## 2020-04-14 DIAGNOSIS — E559 Vitamin D deficiency, unspecified: Secondary | ICD-10-CM

## 2020-04-14 DIAGNOSIS — E1165 Type 2 diabetes mellitus with hyperglycemia: Secondary | ICD-10-CM | POA: Diagnosis not present

## 2020-04-14 DIAGNOSIS — L299 Pruritus, unspecified: Secondary | ICD-10-CM | POA: Diagnosis not present

## 2020-04-14 DIAGNOSIS — I10 Essential (primary) hypertension: Secondary | ICD-10-CM

## 2020-04-14 DIAGNOSIS — R197 Diarrhea, unspecified: Secondary | ICD-10-CM | POA: Insufficient documentation

## 2020-04-14 MED ORDER — PRAVASTATIN SODIUM 20 MG PO TABS
20.0000 mg | ORAL_TABLET | Freq: Every day | ORAL | 1 refills | Status: DC
Start: 2020-04-14 — End: 2020-08-18

## 2020-04-14 NOTE — Assessment & Plan Note (Signed)
Chronic BP well controlled Current regimen effective and well tolerated Continue current medications at current doses cmp  

## 2020-04-14 NOTE — Assessment & Plan Note (Signed)
Chronic Check lipid panel  Continue daily statin Regular exercise and healthy diet encouraged  

## 2020-04-14 NOTE — Assessment & Plan Note (Addendum)
Chronic Check a1c Low sugar / carb diet Stressed regular exercise  Has not  rybelsus since last week - it was helping - will wait until stomach issues improve then retry Continue other medications

## 2020-04-14 NOTE — Assessment & Plan Note (Signed)
Chronic Taking high dose Check level

## 2020-04-14 NOTE — Assessment & Plan Note (Signed)
?   Cause Taking zyrtec and singulair

## 2020-04-14 NOTE — Assessment & Plan Note (Signed)
Acute Started last week - ? Related to sinus infection, abx, rybelsus ( less likely since she did ok with it for two months) Consider taking probiotics Take 7 days only of abx - mild sinus infection Once resolved retry rybelsus

## 2020-04-15 LAB — CMP14+EGFR
ALT: 16 IU/L (ref 0–32)
AST: 11 IU/L (ref 0–40)
Albumin/Globulin Ratio: 1.2 (ref 1.2–2.2)
Albumin: 4.3 g/dL (ref 3.8–4.9)
Alkaline Phosphatase: 108 IU/L (ref 48–121)
BUN/Creatinine Ratio: 17 (ref 9–23)
BUN: 14 mg/dL (ref 6–24)
Bilirubin Total: 0.2 mg/dL (ref 0.0–1.2)
CO2: 26 mmol/L (ref 20–29)
Calcium: 11.2 mg/dL — ABNORMAL HIGH (ref 8.7–10.2)
Chloride: 102 mmol/L (ref 96–106)
Creatinine, Ser: 0.84 mg/dL (ref 0.57–1.00)
GFR calc Af Amer: 89 mL/min/{1.73_m2} (ref >59)
GFR calc non Af Amer: 77 mL/min/{1.73_m2} (ref >59)
Globulin, Total: 3.5 g/dL (ref 1.5–4.5)
Glucose: 200 mg/dL — ABNORMAL HIGH (ref 65–99)
Potassium: 4.2 mmol/L (ref 3.5–5.2)
Sodium: 140 mmol/L (ref 134–144)
Total Protein: 7.8 g/dL (ref 6.0–8.5)

## 2020-04-15 LAB — PTH, INTACT AND CALCIUM
Calcium: 11.2 mg/dL — ABNORMAL HIGH (ref 8.6–10.4)
PTH: 81 pg/mL — ABNORMAL HIGH (ref 14–64)

## 2020-04-15 LAB — ALPHA-GAL PANEL
Alpha Gal IgE*: <0.1 kU/L (ref <0.10)
Beef (Bos spp) IgE: <0.1 kU/L (ref <0.35)
Class Interpretation: 0
Class Interpretation: 0
Class Interpretation: 0
Lamb/Mutton (Ovis spp) IgE: <0.1 kU/L (ref <0.35)
Pork (Sus spp) IgE: <0.1 kU/L (ref <0.35)

## 2020-04-15 LAB — COMPLETE METABOLIC PANEL WITH GFR
AG Ratio: 1.3 (calc) (ref 1.0–2.5)
ALT: 16 U/L (ref 6–29)
AST: 11 U/L (ref 10–35)
Albumin: 4.1 g/dL (ref 3.6–5.1)
Alkaline phosphatase (APISO): 90 U/L (ref 37–153)
BUN: 13 mg/dL (ref 7–25)
CO2: 27 mmol/L (ref 20–32)
Calcium: 11.2 mg/dL — ABNORMAL HIGH (ref 8.6–10.4)
Chloride: 106 mmol/L (ref 98–110)
Creat: 0.96 mg/dL (ref 0.50–1.05)
GFR, Est African American: 76 mL/min/{1.73_m2} (ref >=60)
GFR, Est Non African American: 65 mL/min/{1.73_m2} (ref >=60)
Globulin: 3.1 g/dL (calc) (ref 1.9–3.7)
Glucose, Bld: 175 mg/dL — ABNORMAL HIGH (ref 65–99)
Potassium: 4.6 mmol/L (ref 3.5–5.3)
Sodium: 140 mmol/L (ref 135–146)
Total Bilirubin: 0.4 mg/dL (ref 0.2–1.2)
Total Protein: 7.2 g/dL (ref 6.1–8.1)

## 2020-04-15 LAB — ALLERGEN PROFILE, BASIC FOOD
Allergen Corn, IgE: <0.1 kU/L
Beef IgE: <0.1 kU/L
Chocolate/Cacao IgE: <0.1 kU/L
Egg, Whole IgE: <0.1 kU/L
Food Mix (Seafoods) IgE: NEGATIVE
Milk IgE: <0.1 kU/L
Peanut IgE: <0.1 kU/L
Pork IgE: <0.1 kU/L
Soybean IgE: <0.1 kU/L
Wheat IgE: <0.1 kU/L

## 2020-04-15 LAB — VITAMIN D 25 HYDROXY (VIT D DEFICIENCY, FRACTURES): Vit D, 25-Hydroxy: 56 ng/mL (ref 30–100)

## 2020-04-15 LAB — CHRONIC URTICARIA: cu index: 1.9 (ref <10)

## 2020-04-15 LAB — TRYPTASE: Tryptase: 5.7 ug/L (ref 2.2–13.2)

## 2020-04-15 LAB — HEMOGLOBIN A1C
Hgb A1c MFr Bld: 7.2 % of total Hgb — ABNORMAL HIGH (ref <5.7)
Mean Plasma Glucose: 160 (calc)
eAG (mmol/L): 8.9 (calc)

## 2020-04-15 LAB — ALLERGY PANEL 19, SEAFOOD GROUP
Allergen Salmon IgE: <0.1 kU/L
Catfish: <0.1 kU/L
Codfish IgE: <0.1 kU/L
F023-IgE Crab: <0.1 kU/L
F080-IgE Lobster: <0.1 kU/L
Shrimp IgE: <0.1 kU/L
Tuna: <0.1 kU/L

## 2020-04-15 LAB — C-REACTIVE PROTEIN: CRP: 27 mg/L — ABNORMAL HIGH (ref 0–10)

## 2020-04-15 LAB — SEDIMENTATION RATE: Sed Rate: 50 mm/hr — ABNORMAL HIGH (ref 0–40)

## 2020-04-15 LAB — ANA W/REFLEX IF POSITIVE: Anti Nuclear Antibody (ANA): NEGATIVE

## 2020-04-16 ENCOUNTER — Encounter: Payer: Self-pay | Admitting: Internal Medicine

## 2020-05-18 NOTE — Progress Notes (Signed)
59 y.o. G1P1001 Single Black or [place] American female here for annual exam.  She had Covid in January.  Reports it's been a hard year.  Feels like she's caught every little infection since then.  Reports she itches almost all the time.  Seeing Dr. Jarome Matin.  Currently using CeraVe and hydrocortisone mixed together.  This does help.  Now on singulair and zyrtec twice daily.  This has really helped.    HbA1C is currently 7.2.  HbA1C was 11.1 in March.  Medications have really been changed.  Also, newly on losartan and pravastatin.   Seeing endocrinologist in November, Dr. Kelton Pillar.  Has appt 06/25/2020.   Denies vaginal bleeding.    No LMP recorded. (Menstrual status: IUD).          Sexually active: No.  The current method of family planning is mirena iud placed 2017.    Exercising: No.  exercise Smoker:  no  Health Maintenance: Pap:  10-02-17 neg HPV HR neg History of abnormal Pap:  yes MMG:  03-22-2019 category b density birads 1:neg Colonoscopy:  03-24-15 f/u 67yrs BMD:   none TDaP:  2018 Pneumonia vaccine(s): 2010 Shingrix:   Not done Hep C testing: 2020 neg Screening Labs: not indicated here   reports that she has never smoked. She has never used smokeless tobacco. She reports previous alcohol use. She reports that she does not use drugs.  Past Medical History:  Diagnosis Date  . Allergy   . Anemia   . Arthritis   . Asthma    67yrs ago  . Chronic deafness of both ears    left ear has 40% hearing-right ear has 60 % hearing  . Colon polyps   . Complication of anesthesia    Hard to wake up  . Constipation   . Diabetes mellitus without complication ([place])   . Heartburn   . High cholesterol   . Migraines    none in last 11 yrs  . Obesity   . Pelvic kidney    left  . Pneumonia   . Post partum depression   . Urinary incontinence     Past Surgical History:  Procedure Laterality Date  . CESAREAN SECTION    . COLONOSCOPY WITH PROPOFOL N/A 03/24/2015   Procedure:  COLONOSCOPY WITH PROPOFOL;  Surgeon: Juanita Craver, MD;  [facility];  Service: Endoscopy;  Laterality: N/A;  . HEMORRHOID BANDING  08/2017, 09/2017  . KNEE ARTHROSCOPY WITH MEDIAL MENISECTOMY Left 04/17/2014   Procedure: LEFT KNEE ARTHROSCOPY WITH PARTIAL MEDIAL MENISCECTOMY AND CHONDROPLASTY;  Surgeon: Johnn Hai, MD;  [facility];  Service: Orthopedics;  Laterality: Left;  . TYMPANOSTOMY TUBE PLACEMENT Right     Current Outpatient Medications  Medication Sig Dispense Refill  . cetirizine (ZYRTEC) 10 MG tablet Take 1 tablet (10 mg total) by mouth 2 (two) times daily. 60 tablet 5  . chlorhexidine (PERIDEX) 0.12 % solution SMARTSIG:By Mouth    . Continuous Blood Gluc Receiver (FREESTYLE LIBRE 2 READER) DEVI UAD to check sugars.  Dx NIDDM, E11.65 1 each 0  . Continuous Blood Gluc Sensor (FREESTYLE LIBRE 2 SENSOR) MISC USE AS DIRECTED TO CHECK SUGARS DX E11.65, NIDDM    . losartan (COZAAR) 25 MG tablet Take 1 tablet (25 mg total) by mouth daily. 30 tablet 5  . montelukast (SINGULAIR) 10 MG tablet Take 1 tablet (10 mg total) by mouth at bedtime. 30 tablet 5  . pravastatin (PRAVACHOL) 20 MG tablet Take 1 tablet (20 mg total)  by mouth daily. 90 tablet 1  . Saxagliptin-Metformin 12-998 MG TB24 Take 2 tablets by mouth daily with supper. 60 tablet 5  . olopatadine (PATANOL) 0.1 % ophthalmic solution Place 1 drop into both eyes 2 (two) times daily. (Patient not taking: Reported on 05/22/2020) 5 mL 11  . Semaglutide (RYBELSUS) 3 MG TABS Take 3 mg by mouth daily. (Patient not taking: Reported on 05/19/2020) 30 tablet 5   No current facility-administered medications for this visit.    Family History  Problem Relation Age of Onset  . Hypertension Mother   . Arthritis Mother   . Hypercalcemia Mother   . Obesity Mother   . Allergic rhinitis Mother   . Asthma Mother   . Hypertension Sister   . Deep vein thrombosis Sister   . Pulmonary embolism Sister   . Asthma Daughter   .  Diabetes Maternal Grandmother   . Heart failure Maternal Grandmother   . Heart disease Maternal Grandmother   . Hyperlipidemia Maternal Grandmother   . Hypertension Maternal Grandmother   . Hypertension Maternal Grandfather   . Hyperlipidemia Maternal Grandfather   . Breast cancer Paternal Aunt        diagnosed in her 100's  . Breast cancer Paternal Grandmother     Review of Systems  Constitutional: Negative.   HENT: Negative.   Eyes: Negative.   Respiratory: Negative.   Cardiovascular: Negative.   Gastrointestinal: Negative.   Endocrine: Negative.   Genitourinary: Negative.   Musculoskeletal: Negative.   Skin: Negative.   Allergic/Immunologic: Negative.   Neurological: Negative.   Hematological: Negative.   Psychiatric/Behavioral: Negative.     Exam:   BP 118/80   Pulse 70   Resp 16   Ht 5' 2.75" (1.594 m)   Wt 262 lb (118.8 kg)   BMI 46.78 kg/m   Height: 5' 2.75" (159.4 cm)  General appearance: alert, cooperative and appears stated age Head: Normocephalic, without obvious abnormality, atraumatic Neck: no adenopathy, supple, symmetrical, trachea midline and thyroid normal to inspection and palpation Lungs: clear to auscultation bilaterally Breasts: normal appearance, no masses or tenderness Heart: regular rate and rhythm Abdomen: soft, non-tender; bowel sounds normal; no masses,  no organomegaly Extremities: extremities normal, atraumatic, no cyanosis or edema Skin: Skin color, texture, turgor normal. No rashes or lesions Lymph nodes: Cervical, supraclavicular, and axillary nodes normal. No abnormal inguinal nodes palpated Neurologic: Grossly normal   Pelvic: External genitalia:  no lesions              Urethra:  normal appearing urethra with no masses, tenderness or lesions              Bartholins and Skenes: normal                 Vagina: normal appearing vagina with normal color and discharge, no lesions              Cervix: no lesions and IUD string noted               Pap taken: Yes.    Bimanual Exam:  Uterus:  normal size, contour, position, consistency, mobility, non-tender              Adnexa: normal adnexa and no mass, fullness, tenderness              Anus:  declines  Chaperone, 0.Terence Lux, CMA, was present for exam.  A:  Well Woman with normal exam H/o menorrhagia, mirena placement 10/17.  New 7  year indication discussed. DM, followed by Dr. Quay Burow H/o left pelvic kidney Obesity OSA Asthma  P:   Mammogram guidelines reviewed.  Pt aware this is due. pap smear with neg HR HPV 2019.  Pap and HR HPV obtained today Vaccines reviewed.  Has not done shingrix.  Aware pneumonia vaccine is due as well as flue shot Colonoscopy 8/16.  Follow up 7 years Consider BMD closer to age 50 Lab work is done with Dr. Quay Burow.  Seeing endocrinologist 06/25/2020. Return annually or prn

## 2020-05-19 ENCOUNTER — Ambulatory Visit: Payer: 59 | Admitting: Allergy & Immunology

## 2020-05-19 ENCOUNTER — Other Ambulatory Visit: Payer: Self-pay

## 2020-05-19 ENCOUNTER — Encounter: Payer: Self-pay | Admitting: Allergy & Immunology

## 2020-05-19 VITALS — BP 148/60 | HR 101 | Temp 97.9°F | Resp 16 | Ht 61.0 in | Wt 264.4 lb

## 2020-05-19 DIAGNOSIS — L299 Pruritus, unspecified: Secondary | ICD-10-CM | POA: Diagnosis not present

## 2020-05-19 DIAGNOSIS — H1010 Acute atopic conjunctivitis, unspecified eye: Secondary | ICD-10-CM | POA: Diagnosis not present

## 2020-05-19 DIAGNOSIS — R899 Unspecified abnormal finding in specimens from other organs, systems and tissues: Secondary | ICD-10-CM | POA: Diagnosis not present

## 2020-05-19 MED ORDER — CETIRIZINE HCL 10 MG PO TABS
10.0000 mg | ORAL_TABLET | Freq: Two times a day (BID) | ORAL | 5 refills | Status: DC
Start: 2020-05-19 — End: 2020-12-14

## 2020-05-19 MED ORDER — MONTELUKAST SODIUM 10 MG PO TABS
10.0000 mg | ORAL_TABLET | Freq: Every day | ORAL | 5 refills | Status: DC
Start: 2020-05-19 — End: 2020-12-14

## 2020-05-19 MED ORDER — OLOPATADINE HCL 0.1 % OP SOLN: 1.0000 [drp] | Freq: Two times a day (BID) | OPHTHALMIC | 11 refills | Status: AC

## 2020-05-19 NOTE — Patient Instructions (Addendum)
1. Pruritus - with sensitization to tree pollen - Continue with cetirizine 20mg  once daily. - Continue with Singulair (montelukast) 10mg  daily.  - Add on Patanol one drop per eye twice daily as needed.  - Continue with the use of your moisturizing lotion (Cerve compounded)  - We are going to get some repeat inflammatory markers to see where these are trending.   2. Return in about 6 months (around 11/17/2020).    Please inform us of any Emergency Department visits, hospitalizations, or changes in symptoms. Call us before going to the ED for breathing or allergy symptoms since we might be able to fit you in for a sick visit. Feel free to contact us anytime with any questions, problems, or concerns.  It was a pleasure to see you again today!  Websites that have reliable patient information: 1. American Academy of Asthma, Allergy, and Immunology: www.aaaai.org 2. Food Allergy Research and Education (FARE): foodallergy.org 3. Mothers of Asthmatics: http://www.asthmacommunitynetwork.org 4. American College of Allergy, Asthma, and Immunology: www.acaai.org   COVID-19 Vaccine Information can be found at: ShippingScam.co.uk For questions related to vaccine distribution or appointments, please email [email or web address] or call [phone].     "Like" [place] on Facebook and Instagram for our latest updates!      Make sure you are registered to vote! If you have moved or changed any of your contact information, you will need to get this updated before voting!  In some cases, you MAY be able to register to vote online: [email or web address]

## 2020-05-19 NOTE — Progress Notes (Signed)
FOLLOW UP  Date of Service/Encounter:  05/19/20   Assessment:   Pruritus - much improved  Food intolerance   Conjunctivitis   Plan/Recommendations:   1. Pruritus - with sensitization to tree pollen - Continue with cetirizine 20mg  once daily. - Continue with Singulair (montelukast) 10mg  daily.  - Add on Patanol one drop per eye twice daily as needed.  - Continue with the use of your moisturizing lotion (Cerve compounded)  - We are going to get some repeat inflammatory markers to see where these are trending.  - We are also going to get some anti-thyroid antibodies since these can present early on with itching.   2. Return in about 6 months (around 11/17/2020).   Subjective:   Jill [name] is a 59 y.o. female presenting today for follow up of  Chief Complaint  Patient presents with  . Allergic Rhinitis     Patient say the medication has done well. No hives and itching has died down. Jul 26, 2023 going to endocronologist to check thyroid.     Jill [name] has a history of the following: Patient Active Problem List   Diagnosis Date Noted  . Diarrhea 04/14/2020  . Pruritus 04/12/2020  . Hypertension 01/13/2020  . Hyperlipidemia 01/13/2020  . COVID-19 virus infection 09/13/2019  . Vitamin D deficiency 04/08/2019  . Abdominal pain 11/09/2018  . Abnormal LFTs (liver function tests) 11/09/2018  . Constipation 05/29/2017  . Hemorrhoids 05/29/2017  . Fibroids, intramural 05/03/2016  . Pelvic kidney   . Uncontrolled diabetes mellitus ([place])   . OBESITY, MORBID 07/30/2009  . OBSTRUCTIVE SLEEP APNEA 05/19/2009  . MOTION SICKNESS 03/12/2007  . ASTHMA 01/31/2007  . GERD 01/31/2007    History obtained from: chart review and patient.  Jill [name] is a 59 y.o. female presenting for a follow up visit. She was last seen in August 2021. At that time, we added on Singulair to her cetirizine 20mg  twice daily. We also recommended continuing to use the Cerve.  Testing was negative to the most common foods.   Since the last visit, she has done well. The addition of the Singulair seemed to have helped a lot. As long as she is busy, she does well. She remains on Cerve compounded with hydrocortisone.  She is going to see an Endocrinologist. Her calcium level is high, therefore there is concern for hypothyroidism per the patient. She is going to see someone in the [place] [place] group. This visit is next month. She is not sure what to expect.   She was going to [facility], but she did not feel that it was working. She is working on finding a group to make her accountable. She is open to other ideas. She was doing [employer] before the pandemic, but she has stopped.   Otherwise, there have been no changes to her past medical history, surgical history, family history, or social history.    Review of Systems  Constitutional: Negative.  Negative for chills, fever, malaise/fatigue and weight loss.  HENT: Negative.  Negative for congestion, ear discharge and ear pain.   Eyes: Negative for pain, discharge and redness.  Respiratory: Negative for cough, sputum production, shortness of breath and wheezing.   Cardiovascular: Negative.  Negative for chest pain and palpitations.  Gastrointestinal: Negative for abdominal pain, constipation, diarrhea, heartburn, nausea and vomiting.  Skin: Negative.  Negative for itching and rash.  Neurological: Negative for dizziness and headaches.  Endo/Heme/Allergies: Negative for environmental allergies. Does not bruise/bleed easily.  Objective:   Blood pressure (!) 148/60, pulse (!) 101, temperature 97.9 F (36.6 C), resp. rate 16, height 5\' 1"  (1.549 m), weight 264 lb 6.4 oz (119.9 kg), SpO2 97 %. Body mass index is 49.96 kg/m.   Physical Exam:  Physical Exam Constitutional:      Appearance: She is well-developed.  HENT:     Head: Normocephalic and atraumatic.     Right Ear: Tympanic membrane,  ear canal and external ear normal.     Left Ear: Tympanic membrane, ear canal and external ear normal.     Nose: No nasal deformity, septal deviation, mucosal edema or rhinorrhea.     Right Turbinates: Enlarged and swollen.     Left Turbinates: Enlarged and swollen.     Right Sinus: No maxillary sinus tenderness or frontal sinus tenderness.     Left Sinus: No maxillary sinus tenderness or frontal sinus tenderness.     Mouth/Throat:     Mouth: Mucous membranes are not pale and not dry.     Pharynx: Uvula midline.  Eyes:     General:        Right eye: No discharge.        Left eye: No discharge.     Conjunctiva/sclera: Conjunctivae normal.     Right eye: Right conjunctiva is not injected. No chemosis.    Left eye: Left conjunctiva is not injected. No chemosis.    Pupils: Pupils are equal, round, and reactive to light.  Cardiovascular:     Rate and Rhythm: Normal rate and regular rhythm.     Heart sounds: Normal heart sounds.  Pulmonary:     Effort: Pulmonary effort is normal. No tachypnea, accessory muscle usage or respiratory distress.     Breath sounds: Normal breath sounds. No wheezing, rhonchi or rales.     Comments: Moving air well in all lung fields. No increased work of breathing noted. Chest:     Chest wall: No tenderness.  Lymphadenopathy:     Cervical: No cervical adenopathy.  Skin:    General: Skin is warm.     Capillary Refill: Capillary refill takes less than 2 seconds.     Coloration: Skin is not pale.     Findings: No abrasion, erythema, petechiae or rash. Rash is not papular, urticarial or vesicular.     Comments: No eczematous or urticarial lesions noted.   Neurological:     Mental Status: She is alert.      Diagnostic studies: none     Salvatore Marvel, MD  [facility]

## 2020-05-20 LAB — SEDIMENTATION RATE: Sed Rate: 13 mm/hr (ref 0–40)

## 2020-05-20 LAB — THYROID ANTIBODIES
Thyroglobulin Antibody: <1 IU/mL (ref 0.0–0.9)
Thyroperoxidase Ab SerPl-aCnc: <8 IU/mL (ref 0–34)

## 2020-05-20 LAB — C-REACTIVE PROTEIN: CRP: 23 mg/L — ABNORMAL HIGH (ref 0–10)

## 2020-05-22 ENCOUNTER — Ambulatory Visit: Payer: 59 | Admitting: Obstetrics & Gynecology

## 2020-05-22 ENCOUNTER — Other Ambulatory Visit (HOSPITAL_COMMUNITY)
Admission: RE | Admit: 2020-05-22 | Discharge: 2020-05-22 | Disposition: A | Discharge status: Home / Self Care | Payer: 59 | Source: Ambulatory Visit | Attending: Obstetrics & Gynecology | Admitting: Obstetrics & Gynecology

## 2020-05-22 ENCOUNTER — Encounter: Payer: Self-pay | Admitting: Obstetrics & Gynecology

## 2020-05-22 ENCOUNTER — Other Ambulatory Visit: Payer: Self-pay

## 2020-05-22 VITALS — BP 118/80 | HR 70 | Resp 16 | Ht 62.75 in | Wt 262.0 lb

## 2020-05-22 DIAGNOSIS — Z01419 Encounter for gynecological examination (general) (routine) without abnormal findings: Secondary | ICD-10-CM

## 2020-05-22 DIAGNOSIS — Z124 Encounter for screening for malignant neoplasm of cervix: Secondary | ICD-10-CM | POA: Insufficient documentation

## 2020-05-26 LAB — CYTOLOGY - PAP
Comment: NEGATIVE
Diagnosis: NEGATIVE
High risk HPV: NEGATIVE

## 2020-06-25 ENCOUNTER — Ambulatory Visit: Payer: 59 | Admitting: Internal Medicine

## 2020-06-25 ENCOUNTER — Other Ambulatory Visit: Payer: Self-pay

## 2020-06-25 ENCOUNTER — Encounter: Payer: Self-pay | Admitting: Internal Medicine

## 2020-06-25 NOTE — Progress Notes (Signed)
Name: Jill [name]  MRN/ DOB: [id number], 1960-12-30    Age/ Sex: 59 y.o., female    PCP: Binnie Rail, MD   Reason for Endocrinology Evaluation: Hypercalcemia      Date of Initial Endocrinology Evaluation: 06/25/2020     HPI: Ms. Jill [name] is a 59 y.o. female with a past medical history of Dyslipidemia, HTN and Asthma. The patient presented for initial endocrinology clinic visit on 06/25/2020 for consultative assistance with her Hypercalcemia .   Jill [name] indicates that she was first noted with intermittent  hypercalcemia since 2015, with maxium level os 11.2 mg/dL ( Non corrected) . Since that time, she has not experienced symptoms of constipation, polyuria, polydipsia, but has generalized weakness, diffuse muscle pains, but no significant memory impairment. She denies  use of over the counter calcium lithium, or HCTZ.  She is on  vitamin D supplements 2000 iu daily   She denies  history of kidney stones, kidney disease, liver disease, granulomatous disease. She denies osteoporosis, had a broken knee cap as a child . Daily dietary calcium intake: 2 servings.  She has  family history of osteoporosis ( mother and aunt) , and  thyroid disease (maternal aunt) , believes mother with calcium issue.   She has pruritus and is following with an Allergist   HISTORY:  Past Medical History:  Past Medical History:  Diagnosis Date  . Allergy   . Anemia   . Arthritis   . Asthma    65yrs ago  . Chronic deafness of both ears    left ear has 40% hearing-right ear has 60 % hearing  . Colon polyps   . Complication of anesthesia    Hard to wake up  . Constipation   . Diabetes mellitus without complication ([place])   . Heartburn   . High cholesterol   . Migraines    none in last 11 yrs  . Obesity   . Pelvic kidney    left  . Pneumonia   . Post partum depression   . Urinary incontinence    Past Surgical History:  Past Surgical History:  Procedure Laterality  Date  . CESAREAN SECTION    . COLONOSCOPY WITH PROPOFOL N/A 03/24/2015   Procedure: COLONOSCOPY WITH PROPOFOL;  Surgeon: Juanita Craver, MD;  Location: [facility];  Service: Endoscopy;  Laterality: N/A;  . HEMORRHOID BANDING  08/2017, 09/2017  . KNEE ARTHROSCOPY WITH MEDIAL MENISECTOMY Left 04/17/2014   Procedure: LEFT KNEE ARTHROSCOPY WITH PARTIAL MEDIAL MENISCECTOMY AND CHONDROPLASTY;  Surgeon: Johnn Hai, MD;  Location: [facility];  Service: Orthopedics;  Laterality: Left;  . TYMPANOSTOMY TUBE PLACEMENT Right       Social History:  reports that she has never smoked. She has never used smokeless tobacco. She reports previous alcohol use. She reports that she does not use drugs.  Family History: family history includes Allergic rhinitis in her mother; Arthritis in her mother; Asthma in her daughter and mother; Breast cancer in her paternal aunt and paternal grandmother; Deep vein thrombosis in her sister; Diabetes in her maternal grandmother; Heart disease in her maternal grandmother; Heart failure in her maternal grandmother; Hypercalcemia in her mother; Hyperlipidemia in her maternal grandfather and maternal grandmother; Hypertension in her maternal grandfather, maternal grandmother, mother, and sister; Obesity in her mother; Pulmonary embolism in her sister.   HOME MEDICATIONS: Allergies as of 06/25/2020      Reactions   Linzess [linaclotide] Other (See Comments)   Abdominal pain  Adhesive [tape]    Pulls skin off   Atorvastatin    Muscle aches, stomach upset   Crestor [rosuvastatin]    Stomach pain   Latex Itching   Ozempic (0.25 Or 0.5 Mg-dose) [semaglutide(0.25 Or 0.5mg -dos)]    Stomach pain   Toradol [ketorolac Tromethamine]    Joint pain and swelling   Tramadol Other (See Comments)   JOINT PAIN      Medication List       Accurate as of June 25, 2020  7:54 AM. If you have any questions, ask your nurse or doctor.        cetirizine 10 MG tablet Commonly known as:  ZYRTEC Take 1 tablet (10 mg total) by mouth 2 (two) times daily.   chlorhexidine 0.12 % solution Commonly known as: PERIDEX SMARTSIG:By Mouth   FreeStyle Libre 2 Reader Devi UAD to check sugars.  Dx NIDDM, E11.65   FreeStyle Libre 2 Sensor Misc USE AS DIRECTED TO CHECK SUGARS DX E11.65, NIDDM   losartan 25 MG tablet Commonly known as: Cozaar Take 1 tablet (25 mg total) by mouth daily.   montelukast 10 MG tablet Commonly known as: Singulair Take 1 tablet (10 mg total) by mouth at bedtime.   pravastatin 20 MG tablet Commonly known as: PRAVACHOL Take 1 tablet (20 mg total) by mouth daily.   Rybelsus 3 MG Tabs Generic drug: Semaglutide Take 3 mg by mouth daily.   Saxagliptin-Metformin 12-998 MG Tb24 Take 2 tablets by mouth daily with supper.         REVIEW OF SYSTEMS: A comprehensive ROS was conducted with the patient and is negative except as per HPI and below:  ROS     OBJECTIVE:  VS: BP 134/76   Pulse 96   Ht 5' 2.75" (1.594 m)   Wt 268 lb 8 oz (121.8 kg)   SpO2 96%   BMI 47.94 kg/m    Wt Readings from Last 3 Encounters:  06/25/20 268 lb 8 oz (121.8 kg)  05/22/20 262 lb (118.8 kg)  05/19/20 264 lb 6.4 oz (119.9 kg)     EXAM: General: Pt appears well and is in NAD  Neck: General: Supple without adenopathy. Thyroid: Thyroid size normal.  No goiter or nodules appreciated.  Lungs: Clear with good BS bilat with no rales, rhonchi, or wheezes  Heart: Auscultation: RRR.  Abdomen: Normoactive bowel sounds, soft, nontender, without masses or organomegaly palpable  Extremities:  BL LE: No pretibial edema normal ROM and strength.  Skin: Hair: Texture and amount normal with gender appropriate distribution Skin Inspection: No rashes Skin Palpation: Skin temperature, texture, and thickness normal to palpation  Neuro: Cranial nerves: II - XII grossly intact  Motor: Normal strength throughout DTRs: 2+ and symmetric in UE without delay in relaxation phase  Mental  Status: Judgment, insight: Intact Orientation: Oriented to time, place, and person Mood and affect: No depression, anxiety, or agitation     DATA REVIEWED: Results for Jill, [name] (MRN [id number]) as of 06/26/2020 09:28  Ref. Range 04/14/2020 08:54 04/14/2020 08:54  Sodium Latest Ref Range: 135 - 146 mmol/L 140   Potassium Latest Ref Range: 3.5 - 5.3 mmol/L 4.6   Chloride Latest Ref Range: 98 - 110 mmol/L 106   CO2 Latest Ref Range: 20 - 32 mmol/L 27   Glucose Latest Ref Range: 65 - 99 mg/dL 175 (H)   Mean Plasma Glucose Latest Units: (calc) 160   BUN Latest Ref Range: 7 - 25 mg/dL 13  Creatinine Latest Ref Range: 0.50 - 1.05 mg/dL 0.96   Calcium Latest Ref Range: 8.6 - 10.4 mg/dL 11.2 (H) 11.2 (H)  BUN/Creatinine Ratio Latest Ref Range: 6 - 22 (calc) NOT APPLICABLE   AG Ratio Latest Ref Range: 1.0 - 2.5 (calc) 1.3   AST Latest Ref Range: 10 - 35 U/L 11   ALT Latest Ref Range: 6 - 29 U/L 16   Total Protein Latest Ref Range: 6.1 - 8.1 g/dL 7.2   Total Bilirubin Latest Ref Range: 0.2 - 1.2 mg/dL 0.4   GFR, Est Non African American Latest Ref Range: > OR = 60 mL/min/1.53m2 65   GFR, Est African American Latest Ref Range: > OR = 60 mL/min/1.88m2 76       Results for IBTISAM, BENGE (MRN [id number]) as of 06/25/2020 07:38  Ref. Range 04/14/2020 08:54  Vitamin D, 25-Hydroxy Latest Ref Range: 30 - 100 ng/mL 56    ASSESSMENT/PLAN/RECOMMENDATIONS:   1. Hyperparathyroidism   - We discussed differential of familial hypercalcinuric hypercalcemia ([place]) vs Primary Hyperparathyroidism (pHPT)  - It is important to differentiate between the two, as [place] does not cause any organ damage and does not require further follow up , on the other hand pHPT could cause end organ damage and would require further evaluation.  - Will proceed with 24-hr urince Ca/Cr ratio. She understands to have labs drawn the day she submits the urine sample   Recommendations:  - Please stay Hydrated     - AVOID CALCIUM SUPPLEMENTS, AVOID LOW CALCIUM DIET - Maintain normal dietary calcium intake (2-3 servings of dairy a day)   F/U in 3 months   Signed electronically by: Mack Guise, MD  [facility]  [facility] [address] [place], [place] [address] Phone: [phone] FAX: [phone]   CC: Binnie Rail, MD [place] [address] Phone: [phone] Fax: [phone]   Return to Endocrinology clinic as below: Future Appointments  Date Time Provider [place]  08/18/2020  8:00 AM Binnie Rail, MD [facility] None  11/17/2020  5:30 PM Valentina Shaggy, MD [facility] None

## 2020-06-25 NOTE — Patient Instructions (Signed)
-   Please stay Hydrated    - AVOID CALCIUM SUPPLEMENTS, AVOID LOW CALCIUM DIET - Maintain normal dietary calcium intake (2-3 servings of dairy a day)    24-Hour Urine Collection   You will be collecting your urine for a 24-hour period of time.  Your timer starts with your first urine of the morning (For example - If you first pee at [place], your timer will start at [place])  [place] away your first urine of the morning  Collect your urine every time you pee for the next 24 hours STOP your urine collection 24 hours after you started the collection (For example - You would stop at 9AM the day after you started)

## 2020-07-01 ENCOUNTER — Other Ambulatory Visit (INDEPENDENT_AMBULATORY_CARE_PROVIDER_SITE_OTHER): Payer: 59

## 2020-07-01 ENCOUNTER — Other Ambulatory Visit: Payer: Self-pay | Admitting: Internal Medicine

## 2020-07-01 ENCOUNTER — Other Ambulatory Visit: Payer: Self-pay

## 2020-07-01 LAB — BASIC METABOLIC PANEL
BUN: 13 mg/dL (ref 6–23)
CO2: 29 mEq/L (ref 19–32)
Calcium: 10.3 mg/dL (ref 8.4–10.5)
Chloride: 104 mEq/L (ref 96–112)
Creatinine, Ser: 1.07 mg/dL (ref 0.40–1.20)
GFR: 57.36 mL/min — ABNORMAL LOW (ref >60.00)
Glucose, Bld: 281 mg/dL — ABNORMAL HIGH (ref 70–99)
Potassium: 4.3 mEq/L (ref 3.5–5.1)
Sodium: 138 mEq/L (ref 135–145)

## 2020-07-01 LAB — ALBUMIN: Albumin: 3.8 g/dL (ref 3.5–5.2)

## 2020-07-01 LAB — VITAMIN D 25 HYDROXY (VIT D DEFICIENCY, FRACTURES): VITD: 36.24 ng/mL (ref 30.00–100.00)

## 2020-07-03 LAB — CALCIUM, URINE, 24 HOUR: Calcium, 24H Urine: 191 mg/24 h

## 2020-07-03 LAB — EXTRA URINE SPECIMEN

## 2020-07-03 LAB — PARATHYROID HORMONE, INTACT (NO CA): PTH: 103 pg/mL — ABNORMAL HIGH (ref 14–64)

## 2020-07-03 LAB — CREATININE, URINE, 24 HOUR: Creatinine, 24H Ur: 1.58 g/(24.h) (ref 0.50–2.15)

## 2020-07-15 ENCOUNTER — Encounter: Payer: Self-pay | Admitting: Internal Medicine

## 2020-08-01 ENCOUNTER — Other Ambulatory Visit: Payer: Self-pay | Admitting: Internal Medicine

## 2020-08-17 NOTE — Progress Notes (Signed)
Subjective:    Patient ID: Jill [name], female    DOB: [birth year], 60 y.o.   MRN: [id number]  HPI The patient is here for follow up of their chronic medical problems, including DM, htn, hyperlipidemia, elevated Ca,  obesity   She is not exercising regularly.   Her whole body hurts - from the top of her head to her feet.    She eats out every day.  She did try to lose weight in the past and was not able to.    Her body aches all the time.  Her joints hurt.  She stopped the statin.  Her symptoms improved after she stopped it.  She stopped it one month ago.     Medications and allergies reviewed with patient and updated if appropriate.  Patient Active Problem List   Diagnosis Date Noted   Arthralgia 08/18/2020   Diarrhea 04/14/2020   Pruritus 04/12/2020   Hypertension 01/13/2020   Hyperlipidemia 01/13/2020   COVID-19 virus infection 09/13/2019   Vitamin D deficiency 04/08/2019   Abdominal pain 11/09/2018   Abnormal LFTs (liver function tests) 11/09/2018   Constipation 05/29/2017   Hemorrhoids 05/29/2017   Fibroids, intramural 05/03/2016   Pelvic kidney    Uncontrolled diabetes mellitus ([place])    OBESITY, MORBID 07/30/2009   OBSTRUCTIVE SLEEP APNEA 05/19/2009   MOTION SICKNESS 03/12/2007   ASTHMA 01/31/2007   GERD 01/31/2007    Current Outpatient Medications on File Prior to Visit  Medication Sig Dispense Refill   chlorhexidine (PERIDEX) 0.12 % solution SMARTSIG:By Mouth     KOMBIGLYZE XR 12-998 MG TB24 TAKE 2 TABLETS BY MOUTH DAILY WITH SUPPER. 60 tablet 5   losartan (COZAAR) 25 MG tablet TAKE 1 TABLET BY MOUTH EVERY DAY 30 tablet 5   neomycin-polymyxin b-dexamethasone (MAXITROL) 3.5-10000-0.1 SUSP SMARTSIG:In Eye(s)     olopatadine (PATANOL) 0.1 % ophthalmic solution SMARTSIG:In Eye(s)     cetirizine (ZYRTEC) 10 MG tablet Take 1 tablet (10 mg total) by mouth 2 (two) times daily. (Patient not taking: Reported on 08/18/2020) 60 tablet  5   montelukast (SINGULAIR) 10 MG tablet Take 1 tablet (10 mg total) by mouth at bedtime. (Patient not taking: Reported on 08/18/2020) 30 tablet 5   No current facility-administered medications on file prior to visit.    Past Medical History:  Diagnosis Date   Allergy    Anemia    Arthritis    Asthma    20yrs ago   Chronic deafness of both ears    left ear has 40% hearing-right ear has 60 % hearing   Colon polyps    Complication of anesthesia    Hard to wake up   Constipation    Diabetes mellitus without complication ([facility])    Heartburn    High cholesterol    Migraines    none in last 11 yrs   Obesity    Pelvic kidney    left   Pneumonia    Post partum depression    Urinary incontinence     Past Surgical History:  Procedure Laterality Date   CESAREAN SECTION     COLONOSCOPY WITH PROPOFOL N/A 03/24/2015   Procedure: COLONOSCOPY WITH PROPOFOL;  Surgeon: Juanita Craver, MD;  [facility];  Service: Endoscopy;  Laterality: N/A;   HEMORRHOID BANDING  08/2017, 09/2017   KNEE ARTHROSCOPY WITH MEDIAL MENISECTOMY Left 04/17/2014   Procedure: LEFT KNEE ARTHROSCOPY WITH PARTIAL MEDIAL MENISCECTOMY AND CHONDROPLASTY;  Surgeon: Johnn Hai, MD;  Location: [facility];  Service: Orthopedics;  Laterality: Left;   TYMPANOSTOMY TUBE PLACEMENT Right     Social History   Socioeconomic History   Marital status: Single    Spouse name: Not on file   Number of children: 1   Years of education: college    Highest education level: Associate degree: academic program  Occupational History   Occupation: social services case worker    Employer: [employer]  Tobacco Use   Smoking status: Never Smoker   Smokeless tobacco: Never Used  Scientific laboratory technician Use: Never used  Substance and Sexual Activity   Alcohol use: Not Currently   Drug use: No   Sexual activity: Not Currently    Partners: Male    Birth control/protection: Abstinence, I.U.D.     Comment: Mirena placed 05/2016  Other Topics Concern   Not on file  Social History Narrative   Single. Education: [employer].    Social Determinants of Health   Financial Resource Strain: Not on file  Food Insecurity: Not on file  Transportation Needs: Not on file  Physical Activity: Not on file  Stress: Not on file  Social Connections: Not on file    Family History  Problem Relation Age of Onset   Hypertension Mother    Arthritis Mother    Hypercalcemia Mother    Obesity Mother    Allergic rhinitis Mother    Asthma Mother    Hypertension Sister    Deep vein thrombosis Sister    Pulmonary embolism Sister    Asthma Daughter    Diabetes Maternal Grandmother    Heart failure Maternal Grandmother    Heart disease Maternal Grandmother    Hyperlipidemia Maternal Grandmother    Hypertension Maternal Grandmother    Hypertension Maternal Grandfather    Hyperlipidemia Maternal Grandfather    Breast cancer Paternal Aunt        diagnosed in her 68's   Breast cancer Paternal Grandmother     Review of Systems  Constitutional: Negative for chills and fever.  Respiratory: Negative for cough, shortness of breath and wheezing.   Cardiovascular: Positive for leg swelling (related to food - eats out every day). Negative for chest pain and palpitations.  Gastrointestinal:       Ernest Haber with spice and red meat  Musculoskeletal: Positive for arthralgias (knees, hips, ankles pain, hands gets stiff) and myalgias (upper legs).  Neurological: Negative for dizziness, light-headedness and headaches.       Objective:   Vitals:   08/18/20 0752  BP: 132/74  Pulse: 92  Temp: 98 F (36.7 C)  SpO2: 98%   BP Readings from Last 3 Encounters:  08/18/20 132/74  06/25/20 134/76  05/22/20 118/80   Wt Readings from Last 3 Encounters:  08/18/20 272 lb 12.8 oz (123.7 kg)  06/25/20 268 lb 8 oz (121.8 kg)  05/22/20 262 lb (118.8 kg)   Body mass index is 48.71 kg/m.    Physical Exam    Constitutional: Appears well-developed and well-nourished. No distress.  HENT:  Head: Normocephalic and atraumatic.  Neck: Neck supple. No tracheal deviation present. No thyromegaly present.  No cervical lymphadenopathy Cardiovascular: Normal rate, regular rhythm and normal heart sounds.   No murmur heard. No carotid bruit .  Mild b/l LE edema Pulmonary/Chest: Effort normal. No respiratory distress. Minimal end expiration wheezes. No rales.  Skin: Skin is warm and dry. Not diaphoretic.  Psychiatric: Normal mood and affect. Behavior is normal.    The 10-year ASCVD risk score Mikey Bussing DC  Brooke Bonito., et al., 2013) is: 12.3%   Values used to calculate the score:     Age: 55 years     Sex: Female     Is Non-Hispanic African American: Yes     Diabetic: Yes     Tobacco smoker: No     Systolic Blood Pressure: 771 mmHg     Is BP treated: Yes     HDL Cholesterol: 47.5 mg/dL     Total Cholesterol: 141 mg/dL    Assessment & Plan:    See Problem List for Assessment and Plan of chronic medical problems.    This visit occurred during the SARS-CoV-2 public health emergency.  Safety protocols were in place, including screening questions prior to the visit, additional usage of staff PPE, and extensive cleaning of exam room while observing appropriate contact time as indicated for disinfecting solutions.

## 2020-08-17 NOTE — Patient Instructions (Addendum)
°  Blood work was ordered.     Medications changes include :   none    Please followup in 4 months

## 2020-08-18 ENCOUNTER — Encounter: Payer: Self-pay | Admitting: Internal Medicine

## 2020-08-18 ENCOUNTER — Other Ambulatory Visit: Payer: Self-pay

## 2020-08-18 ENCOUNTER — Ambulatory Visit: Payer: 59 | Admitting: Internal Medicine

## 2020-08-18 VITALS — BP 132/74 | HR 92 | Temp 98.0°F | Ht 62.75 in | Wt 272.8 lb

## 2020-08-18 DIAGNOSIS — Z6841 Body Mass Index (BMI) 40.0 and over, adult: Secondary | ICD-10-CM

## 2020-08-18 DIAGNOSIS — E782 Mixed hyperlipidemia: Secondary | ICD-10-CM | POA: Diagnosis not present

## 2020-08-18 DIAGNOSIS — E1165 Type 2 diabetes mellitus with hyperglycemia: Secondary | ICD-10-CM | POA: Diagnosis not present

## 2020-08-18 DIAGNOSIS — M2550 Pain in unspecified joint: Secondary | ICD-10-CM | POA: Insufficient documentation

## 2020-08-18 DIAGNOSIS — I10 Essential (primary) hypertension: Secondary | ICD-10-CM | POA: Diagnosis not present

## 2020-08-18 DIAGNOSIS — K760 Fatty (change of) liver, not elsewhere classified: Secondary | ICD-10-CM | POA: Insufficient documentation

## 2020-08-18 DIAGNOSIS — G720 Drug-induced myopathy: Secondary | ICD-10-CM | POA: Insufficient documentation

## 2020-08-18 DIAGNOSIS — T466X5A Adverse effect of antihyperlipidemic and antiarteriosclerotic drugs, initial encounter: Secondary | ICD-10-CM | POA: Insufficient documentation

## 2020-08-18 LAB — LIPID PANEL
Cholesterol: 187 mg/dL (ref 0–200)
HDL: 44.1 mg/dL (ref >39.00)
LDL Cholesterol: 130 mg/dL — ABNORMAL HIGH (ref 0–99)
NonHDL: 143.14
Total CHOL/HDL Ratio: 4
Triglycerides: 68 mg/dL (ref 0.0–149.0)
VLDL: 13.6 mg/dL (ref 0.0–40.0)

## 2020-08-18 LAB — HEMOGLOBIN A1C: Hgb A1c MFr Bld: 11.7 % — ABNORMAL HIGH (ref 4.6–6.5)

## 2020-08-18 LAB — COMPREHENSIVE METABOLIC PANEL
ALT: 24 U/L (ref 0–35)
AST: 12 U/L (ref 0–37)
Albumin: 4.2 g/dL (ref 3.5–5.2)
Alkaline Phosphatase: 91 U/L (ref 39–117)
BUN: 16 mg/dL (ref 6–23)
CO2: 26 mEq/L (ref 19–32)
Calcium: 10.6 mg/dL — ABNORMAL HIGH (ref 8.4–10.5)
Chloride: 103 mEq/L (ref 96–112)
Creatinine, Ser: 0.95 mg/dL (ref 0.40–1.20)
GFR: 66.1 mL/min (ref >60.00)
Glucose, Bld: 276 mg/dL — ABNORMAL HIGH (ref 70–99)
Potassium: 4.3 mEq/L (ref 3.5–5.1)
Sodium: 135 mEq/L (ref 135–145)
Total Bilirubin: 0.4 mg/dL (ref 0.2–1.2)
Total Protein: 7.4 g/dL (ref 6.0–8.3)

## 2020-08-18 LAB — SEDIMENTATION RATE: Sed Rate: 34 mm/hr — ABNORMAL HIGH (ref 0–30)

## 2020-08-18 LAB — C-REACTIVE PROTEIN: CRP: 2.9 mg/dL (ref 0.5–20.0)

## 2020-08-18 MED ORDER — FREESTYLE LIBRE 2 READER DEVI: 0 refills | Status: DC

## 2020-08-18 MED ORDER — FREESTYLE LIBRE 2 SENSOR MISC: Status: DC

## 2020-08-18 NOTE — Assessment & Plan Note (Signed)
Chronic Last a1c 7.2% Did not tolerate rybelsus Continue kombiglyze XR 12-998 mg - 2 tabs with dinner Stressed weight loss, regular exercise and diabetic diet She deferred diabetic education/nutrition a1c today - advised she may need additional medication to help improve sugars

## 2020-08-18 NOTE — Assessment & Plan Note (Signed)
Acute Statin induced myopathy Has not tolerated > 1 statin - most recently pravastatin - has been off for one month Will consider zetia after checking lipids today

## 2020-08-18 NOTE — Assessment & Plan Note (Signed)
Chronic BMI 48.71  Stressed regular exercise, decreased portions and a more healthy diet Advised she will not be able to lose weight while eating out daily Deferred nutrition/diabetic education

## 2020-08-18 NOTE — Assessment & Plan Note (Signed)
Chronic Intolerant to several statins - myalgia Check lipids, cmp Consider zetia Stressed regular exercise, weight loss, healthy diet

## 2020-08-18 NOTE — Assessment & Plan Note (Signed)
Chronic BP well controlled Continue losartan 25 mg daily cmp  

## 2020-08-18 NOTE — Assessment & Plan Note (Signed)
Chronic Pain in ankles, knees and hip, stiffness in hands Doubt autoimmune - ANA, RF negative years ago - this is likely OA, but will recheck esr, ANA, RF,CRP Stressed weight loss and regular exercise

## 2020-08-20 LAB — ANA: Anti Nuclear Antibody (ANA): NEGATIVE

## 2020-08-20 LAB — RHEUMATOID FACTOR: Rheumatoid fact SerPl-aCnc: <14 IU/mL (ref <14)

## 2020-08-20 MED ORDER — GLIPIZIDE 5 MG PO TABS: 5.0000 mg | ORAL_TABLET | Freq: Two times a day (BID) | ORAL | 3 refills | Status: DC

## 2020-08-20 MED ORDER — DAPAGLIFLOZIN PROPANEDIOL 5 MG PO TABS: 5.0000 mg | ORAL_TABLET | Freq: Every day | ORAL | 3 refills | Status: DC

## 2020-08-20 NOTE — Addendum Note (Signed)
Addended by: Binnie Rail on: 08/20/2020 07:49 AM   Modules accepted: Orders

## 2020-08-28 ENCOUNTER — Encounter: Payer: Self-pay | Admitting: Internal Medicine

## 2020-08-28 MED ORDER — EMPAGLIFLOZIN 10 MG PO TABS: 10.0000 mg | ORAL_TABLET | Freq: Every day | ORAL | 5 refills | Status: DC

## 2020-09-02 NOTE — Progress Notes (Signed)
I, Peterson Lombard, LAT, ATC acting as a scribe for Lynne Leader, MD.  Jill [name] is a 60 y.o. female who presents to [place] at [facility] today for R knee pain ongoing since 09/01/20. Pt was last seen by Dr. Georgina Snell for L knee pain due to DJD on 01/15/20 and was given a steroid injection. Today, pt reports she was walking on 1/25 she just started experiencing extreme pain and buckling of the R knee. Pt locates pain to anterior, medial aspect of R knee.   Mechanical symptoms: no R knee swelling: slight Aggravates: walking, sitting, moving knee Rx tried: ice, Tylenol  Dx imaging: 01/15/20 L knee XR  Pt also c/o L ankle pain. MOI: In March, pt twisted her L ankle and is now experiencing constant pain along the lateral aspect along the lateral malleolus. Swelling is present, esp after prolonged standing.  Pertinent review of systems: No fevers or chills  Relevant historical information: Diabetes, sleep apnea   Exam:  BP (!) 142/98   Pulse 92   Ht 5' 2.75" (1.594 m)   Wt 263 lb 3.2 oz (119.4 kg)   SpO2 97%   BMI 47.00 kg/m  General: Well Developed, well nourished, and in no acute distress.   MSK: Right knee moderate effusion otherwise normal-appearing Normal motion with mild crepitation. Tender palpation medial joint line. Stable ligamentous exam. Positive McMurray's test. Intact strength.  Left ankle normal-appearing Normal motion. Stable ligamentous exam. Intact strength. Tender palpation ATFL region.    Lab and Radiology Results No results found for this or any previous visit (from the past 72 hour(s)). DG Ankle Complete Left  Result Date: 09/03/2020 CLINICAL DATA:  60 year old female with chronic ankle pain EXAM: LEFT ANKLE COMPLETE - 3+ VIEW COMPARISON:  None. FINDINGS: No acute displaced fracture. Ankle mortise is congruent. Degenerative changes of the hindfoot including enthesopathic changes at the Achilles insertion. Lateral view  demonstrates joint effusion at the tibiotalar joint. Degenerative changes of the midfoot. Nonspecific soft tissue swelling of the leg and ankle. IMPRESSION: Negative for acute bony abnormality. Joint effusion present at the tibiotalar joint, as well as nonspecific soft tissue swelling of the lower leg and ankle. Degenerative changes Electronically Signed   By: Corrie Mckusick D.O.   On: 09/03/2020 09:17   DG Knee AP/LAT W/Sunrise Right  Result Date: 09/03/2020 CLINICAL DATA:  Right knee pain, no known injury, initial encounter EXAM: RIGHT KNEE 3 VIEWS COMPARISON:  None. FINDINGS: Mild patellofemoral degenerative changes are noted. No acute fracture or dislocation is seen. No joint effusion is noted. IMPRESSION: Mild degenerative change without acute abnormality. Electronically Signed   By: Inez Catalina M.D.   On: 09/03/2020 09:20   I, Lynne Leader, personally (independently) visualized and performed the interpretation of the images attached in this note.   Procedure: Real-time Ultrasound Guided Injection of right knee superior lateral patellar space Device: Philips Affiniti 50G Images permanently stored and available for review in PACS Ultrasound inspection prior to injection reveals moderate effusion. Degenerative medial and lateral joint line, and no significant Baker's cyst. Verbal informed consent obtained.  Discussed risks and benefits of procedure. Warned about infection bleeding damage to structures skin hypopigmentation and fat atrophy among others. Patient expresses understanding and agreement Time-out conducted.   Noted no overlying erythema, induration, or other signs of local infection.   Skin prepped in a sterile fashion.   Local anesthesia: Topical Ethyl chloride.   With sterile technique and under real time ultrasound guidance:  40 mg of Kenalog and 2 mL of Marcaine injected into right knee. Fluid seen entering the joint capsule.   Completed without difficulty   Pain immediately  resolved suggesting accurate placement of the medication.   Advised to call if fevers/chills, erythema, induration, drainage, or persistent bleeding.   Images permanently stored and available for review in the ultrasound unit.  Impression: Technically successful ultrasound guided injection.   Diagnostic Limited MSK Ultrasound of: Left ankle Small hyperechoic change present at ATFL region consistent with tiny avulsion fracture versus degenerative changes. Impression: Possible tiny avulsion flagging and ATFL region.     Assessment and Plan: 60 y.o. female with right knee pain but to be due to exacerbation of DJD. Plan for steroid injection, Voltaren gel and quad strengthening.  Left ankle pain. Patient suffered an inversion injury to her ankle approximately 10 months ago. She has degenerative changes present on x-ray today. Additionally ultrasound was concerning for possible old avulsion injury which may be not well seen on x-ray today. Plan for home exercise program taught in clinic today. Recheck back if not improving.   PDMP not reviewed this encounter. Orders Placed This Encounter  Procedures  . DG Ankle Complete Left    Standing Status:   Future    Number of Occurrences:   1    Standing Expiration Date:   09/03/2021    Order Specific Question:   Reason for Exam (SYMPTOM  OR DIAGNOSIS REQUIRED)    Answer:   chronic left ankle pain    Order Specific Question:   Preferred imaging location?    Answer:   [facility]    Order Specific Question:   Is patient pregnant?    Answer:   No  . [place] LIMITED JOINT SPACE STRUCTURES LOW RIGHT(NO LINKED CHARGES)    Standing Status:   Future    Number of Occurrences:   1    Standing Expiration Date:   03/03/2021    Order Specific Question:   Reason for Exam (SYMPTOM  OR DIAGNOSIS REQUIRED)    Answer:   acute right knee pain    Order Specific Question:   Preferred imaging location?    Answer:   [facility]  . DG  Knee AP/LAT W/Sunrise Right    Standing Status:   Future    Number of Occurrences:   1    Standing Expiration Date:   09/03/2021    Order Specific Question:   Reason for Exam (SYMPTOM  OR DIAGNOSIS REQUIRED)    Answer:   right knee pain    Order Specific Question:   Preferred imaging location?    Answer:   [facility]    Order Specific Question:   Is patient pregnant?    Answer:   No   No orders of the defined types were placed in this encounter.    Discussed warning signs or symptoms. Please see discharge instructions. Patient expresses understanding.   The above documentation has been reviewed and is accurate and complete Lynne Leader, M.D.

## 2020-09-03 ENCOUNTER — Ambulatory Visit: Payer: Self-pay

## 2020-09-03 ENCOUNTER — Ambulatory Visit: Payer: 59 | Admitting: Family Medicine

## 2020-09-03 ENCOUNTER — Ambulatory Visit (INDEPENDENT_AMBULATORY_CARE_PROVIDER_SITE_OTHER): Payer: 59

## 2020-09-03 ENCOUNTER — Other Ambulatory Visit: Payer: Self-pay

## 2020-09-03 VITALS — BP 142/98 | HR 92 | Ht 62.75 in | Wt 263.2 lb

## 2020-09-03 DIAGNOSIS — M25561 Pain in right knee: Secondary | ICD-10-CM

## 2020-09-03 DIAGNOSIS — G8929 Other chronic pain: Secondary | ICD-10-CM

## 2020-09-03 DIAGNOSIS — M25572 Pain in left ankle and joints of left foot: Secondary | ICD-10-CM | POA: Diagnosis not present

## 2020-09-03 NOTE — Progress Notes (Signed)
Right knee x-ray shows mild arthritis.

## 2020-09-03 NOTE — Progress Notes (Signed)
Left ankle x-ray shows some arthritis in the ankle but no fracture visible.

## 2020-09-03 NOTE — Patient Instructions (Addendum)
Thank you for coming in today.  Please get an Xray today before you leave today.  Plan for home exercises. View at [email or web address] using code: [id number]  Please use voltaren gel up to 4x daily for pain as needed.

## 2020-09-04 ENCOUNTER — Ambulatory Visit: Payer: 59 | Admitting: Family Medicine

## 2020-09-16 ENCOUNTER — Telehealth (INDEPENDENT_AMBULATORY_CARE_PROVIDER_SITE_OTHER): Payer: 59 | Admitting: Family

## 2020-09-16 ENCOUNTER — Other Ambulatory Visit: Payer: Self-pay

## 2020-09-16 DIAGNOSIS — J019 Acute sinusitis, unspecified: Secondary | ICD-10-CM | POA: Diagnosis not present

## 2020-09-16 DIAGNOSIS — H9201 Otalgia, right ear: Secondary | ICD-10-CM

## 2020-09-16 MED ORDER — NEOMYCIN-POLYMYXIN-HC 1 % OT SOLN: 3.0000 [drp] | Freq: Four times a day (QID) | OTIC | 0 refills | Status: DC

## 2020-09-16 MED ORDER — AMOXICILLIN-POT CLAVULANATE 875-125 MG PO TABS: 1.0000 | ORAL_TABLET | Freq: Two times a day (BID) | ORAL | 0 refills | Status: AC

## 2020-09-16 NOTE — Progress Notes (Signed)
Jill [name] is a 60 y.o. female with the following history as recorded in EpicCare:  Patient Active Problem List   Diagnosis Date Noted  . Arthralgia 08/18/2020  . Hepatic steatosis 08/18/2020  . 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 Adult BMI 45.0-49.9 kg/sq m ([place]) 08/18/2020  . Diarrhea 04/14/2020  . Pruritus 04/12/2020  . Hypertension 01/13/2020  . Hyperlipidemia 01/13/2020  . COVID-19 virus infection 09/13/2019  . Vitamin D deficiency 04/08/2019  . Constipation 05/29/2017  . Hemorrhoids 05/29/2017  . Fibroids, intramural 05/03/2016  . Pelvic kidney   . Uncontrolled diabetes mellitus ([place])   . OBESITY, MORBID 07/30/2009  . OBSTRUCTIVE SLEEP APNEA 05/19/2009  . MOTION SICKNESS 03/12/2007  . ASTHMA 01/31/2007  . GERD 01/31/2007    Current Outpatient Medications  Medication Sig Dispense Refill  . amoxicillin-clavulanate (AUGMENTIN) 875-125 MG tablet Take 1 tablet by mouth 2 (two) times daily for 10 days. 20 tablet 0  . NEOMYCIN-POLYMYXIN-HYDROCORTISONE (CORTISPORIN) 1 % SOLN OTIC solution Place 3 drops into the right ear 4 (four) times daily. 10 mL 0  . cetirizine (ZYRTEC) 10 MG tablet Take 1 tablet (10 mg total) by mouth 2 (two) times daily. (Patient not taking: Reported on 08/18/2020) 60 tablet 5  . chlorhexidine (PERIDEX) 0.12 % solution SMARTSIG:By Mouth    . Continuous Blood Gluc Receiver (FREESTYLE LIBRE 2 READER) DEVI UAD to check sugars.  Dx NIDDM, E11.65 1 each 0  . Continuous Blood Gluc Sensor (FREESTYLE LIBRE 2 SENSOR) MISC USE AS DIRECTED TO CHECK SUGARS DX E11.65, NIDDM    . empagliflozin (JARDIANCE) 10 MG TABS tablet Take 1 tablet (10 mg total) by mouth daily. 30 tablet 5  . glipiZIDE (GLUCOTROL) 5 MG tablet Take 1 tablet (5 mg total) by mouth 2 (two) times daily before a meal. 60 tablet 3  . KOMBIGLYZE XR 12-998 MG TB24 TAKE 2 TABLETS BY MOUTH DAILY WITH SUPPER. 60 tablet 5  . losartan (COZAAR) 25 MG tablet TAKE 1 TABLET BY MOUTH EVERY DAY 30 tablet 5  .  montelukast (SINGULAIR) 10 MG tablet Take 1 tablet (10 mg total) by mouth at bedtime. (Patient not taking: Reported on 08/18/2020) 30 tablet 5  . neomycin-polymyxin b-dexamethasone (MAXITROL) 3.5-10000-0.1 SUSP SMARTSIG:In Eye(s)    . olopatadine (PATANOL) 0.1 % ophthalmic solution SMARTSIG:In Eye(s)     No current facility-administered medications for this visit.    Allergies: Linzess [linaclotide], Pravastatin, Adhesive [tape], Atorvastatin, Crestor [rosuvastatin], Latex, Ozempic (0.25 or 0.5 mg-dose) [semaglutide(0.25 or 0.5mg -dos)], Toradol [ketorolac tromethamine], Tramadol, and Semaglutide  Past Medical History:  Diagnosis Date  . Allergy   . Anemia   . Arthritis   . Asthma    53yrs ago  . Chronic deafness of both ears    left ear has 40% hearing-right ear has 60 % hearing  . Colon polyps   . Complication of anesthesia    Hard to wake up  . Constipation   . Diabetes mellitus without complication ([place])   . Heartburn   . High cholesterol   . Migraines    none in last 11 yrs  . Obesity   . Pelvic kidney    left  . Pneumonia   . Post partum depression   . Urinary incontinence     Past Surgical History:  Procedure Laterality Date  . CESAREAN SECTION    . COLONOSCOPY WITH PROPOFOL N/A 03/24/2015   Procedure: COLONOSCOPY WITH PROPOFOL;  Surgeon: Juanita Craver, MD;  Location: [facility];  Service: Endoscopy;  Laterality: N/A;  .  HEMORRHOID BANDING  08/2017, 09/2017  . KNEE ARTHROSCOPY WITH MEDIAL MENISECTOMY Left 04/17/2014   Procedure: LEFT KNEE ARTHROSCOPY WITH PARTIAL MEDIAL MENISCECTOMY AND CHONDROPLASTY;  Surgeon: Johnn Hai, MD;  Location: [facility];  Service: Orthopedics;  Laterality: Left;  . TYMPANOSTOMY TUBE PLACEMENT Right     Family History  Problem Relation Age of Onset  . Hypertension Mother   . Arthritis Mother   . Hypercalcemia Mother   . Obesity Mother   . Allergic rhinitis Mother   . Asthma Mother   . Hypertension Sister   . Deep vein thrombosis Sister    . Pulmonary embolism Sister   . Asthma Daughter   . Diabetes Maternal Grandmother   . Heart failure Maternal Grandmother   . Heart disease Maternal Grandmother   . Hyperlipidemia Maternal Grandmother   . Hypertension Maternal Grandmother   . Hypertension Maternal Grandfather   . Hyperlipidemia Maternal Grandfather   . Breast cancer Paternal Aunt        diagnosed in her 35's  . Breast cancer Paternal Grandmother     Social History   Tobacco Use  . Smoking status: Never Smoker  . Smokeless tobacco: Never Used  Substance Use Topics  . Alcohol use: Not Currently    Subjective:   I connected with [name] on 09/16/20 at  8:40 AM EST by a telephone call and verified that I am speaking with the correct person using two identifiers.   I discussed the limitations of evaluation and management by telemedicine and the availability of in person appointments. The patient expressed understanding and agreed to proceed. Provider in office/ patient is at home; provider and patient are only 2 people on telephone call.   Concerned for possible ear infection; 3 day history of right ear pain/ swollen lymph nodes/ sinus pressure; no fever or sore throat; no concerns for COVID exposure; did have a recent dental cleaning but no known underlying dental issues;     Objective:  There were no vitals filed for this visit.   Lungs: Respirations unlabored;  Neurologic: Alert and oriented; speech intact;   Assessment:  1. Acute sinusitis, recurrence not specified, unspecified location   2. Right ear pain     Plan:  ? Underlying dental source; will treat with Augmentin and Cortisporin Otic; will need to be seen in office if symptoms persist.  Time spent 11 minutes  No follow-ups on file.  No orders of the defined types were placed in this encounter.   Requested Prescriptions   Signed Prescriptions Disp Refills  . amoxicillin-clavulanate (AUGMENTIN) 875-125 MG tablet 20 tablet 0     Sig: Take 1 tablet by mouth 2 (two) times daily for 10 days.  . NEOMYCIN-POLYMYXIN-HYDROCORTISONE (CORTISPORIN) 1 % SOLN OTIC solution 10 mL 0    Sig: Place 3 drops into the right ear 4 (four) times daily.

## 2020-09-30 ENCOUNTER — Encounter: Payer: Self-pay | Admitting: Internal Medicine

## 2020-09-30 ENCOUNTER — Ambulatory Visit: Payer: 59 | Admitting: Internal Medicine

## 2020-09-30 ENCOUNTER — Other Ambulatory Visit: Payer: Self-pay

## 2020-09-30 VITALS — BP 122/78 | HR 90 | Ht 62.75 in | Wt 267.1 lb

## 2020-09-30 DIAGNOSIS — E210 Primary hyperparathyroidism: Secondary | ICD-10-CM

## 2020-09-30 DIAGNOSIS — E1165 Type 2 diabetes mellitus with hyperglycemia: Secondary | ICD-10-CM | POA: Diagnosis not present

## 2020-09-30 DIAGNOSIS — Z8639 Personal history of other endocrine, nutritional and metabolic disease: Secondary | ICD-10-CM | POA: Insufficient documentation

## 2020-09-30 LAB — MICROALBUMIN / CREATININE URINE RATIO
Creatinine,U: 97.4 mg/dL
Microalb Creat Ratio: 0.7 mg/g (ref 0.0–30.0)
Microalb, Ur: <0.7 mg/dL (ref 0.0–1.9)

## 2020-09-30 LAB — COMPREHENSIVE METABOLIC PANEL
ALT: 22 U/L (ref 0–35)
AST: 13 U/L (ref 0–37)
Albumin: 4 g/dL (ref 3.5–5.2)
Alkaline Phosphatase: 68 U/L (ref 39–117)
BUN: 18 mg/dL (ref 6–23)
CO2: 27 mEq/L (ref 19–32)
Calcium: 10.9 mg/dL — ABNORMAL HIGH (ref 8.4–10.5)
Chloride: 106 mEq/L (ref 96–112)
Creatinine, Ser: 1.09 mg/dL (ref 0.40–1.20)
GFR: 56 mL/min — ABNORMAL LOW (ref >60.00)
Glucose, Bld: 100 mg/dL — ABNORMAL HIGH (ref 70–99)
Potassium: 4.7 mEq/L (ref 3.5–5.1)
Sodium: 140 mEq/L (ref 135–145)
Total Bilirubin: 0.4 mg/dL (ref 0.2–1.2)
Total Protein: 7.5 g/dL (ref 6.0–8.3)

## 2020-09-30 LAB — VITAMIN D 25 HYDROXY (VIT D DEFICIENCY, FRACTURES): VITD: 23.7 ng/mL — ABNORMAL LOW (ref 30.00–100.00)

## 2020-09-30 NOTE — Progress Notes (Signed)
Name: Jill [name]  MRN/ DOB: [id number], [birth year]    Age/ Sex: 60 y.o., female     PCP: Binnie Rail, MD   Reason for Endocrinology Evaluation: Hypercalcemia      Initial Endocrinology Clinic Visit: 06/25/2020    PATIENT IDENTIFIER: Jill [name] is a 60 y.o., female with a past medical history of Dyslipidemia, HTN and Asthma . She has followed with [facility] since 06/25/2020 for consultative assistance with management of her hypercalcemia .   HISTORICAL SUMMARY: The patient was first diagnosed with hypercalcemia in 2015, with maximum serum calcium level of  level 11.2 mg/dL ( Non corrected)  She denies  history of kidney stones, kidney disease, liver disease, granulomatous disease. She denies osteoporosis, had a broken knee cap as a child    Her 24- hr urinary Ca/Cr ratio of 0.012 with a 191 mg of 24-hr calcium (06/2020)    DIABETES HISTORY:  She was diagnosed with DM ~ 30 yrs ago.  She has tried Ozempic but caused GI  Symptoms . Has been on tradjenta in the past as well without reported intolerance .Jardiance and Glipizide  Were  started 08/2020  Her A1c has ranged from 6.7% in 2019 to 11.7% in 2022.    SUBJECTIVE:    Today (09/30/2020):  Jill [name] is here for hypercalcemia.   She has been staying hydrated  She consumes 2 servings of calcium daily  She has been avoiding OTC calcium tablets   In review of her chart she has been noted with hyperglycemia and A1 cof 11.7 %.  Has been taking Glipizide with breakfast and lunch . Has been noted with hypoglycemia mid-day       Jardiance 10 mg  Glipizide 5 mg BID  Kombiglyze XR,  12-998 2 tabs at dinner  Vitamin D 2000 iu daily       CONTINUOUS GLUCOSE MONITORING RECORD INTERPRETATION    Dates of Recording: 2/10-2/23/2022  Sensor description:freestyle libre  Results statistics:   CGM use % of time 78  Average and SD 140/30.9  Time in range      83  %  % Time  Above 180 14  % Time above 250 3  % Time Below target 0    Glycemic patterns summary: hyperglycemia noted after supper and lasts the first half of the night and decreased over night   Hyperglycemic episodes  Post supper   Hypoglycemic episodes occurred mid day   Overnight periods: trends down      HISTORY:  Past Medical History:  Past Medical History:  Diagnosis Date   Allergy    Anemia    Arthritis    Asthma    15yrs ago   Chronic deafness of both ears    left ear has 40% hearing-right ear has 60 % hearing   Colon polyps    Complication of anesthesia    Hard to wake up   Constipation    Diabetes mellitus without complication (H[facility])    Heartburn    High cholesterol    Migraines    none in last 11 yrs   Obesity    Pelvic kidney    left   Pneumonia    Post partum depression    Urinary incontinence    Past Surgical History:  Past Surgical History:  Procedure Laterality Date   CESAREAN SECTION     COLONOSCOPY WITH PROPOFOL N/A 03/24/2015   Procedure: COLONOSCOPY WITH PROPOFOL;  Surgeon: Juanita Craver,  MD;  Location: [facility];  Service: Endoscopy;  Laterality: N/A;   HEMORRHOID BANDING  08/2017, 09/2017   KNEE ARTHROSCOPY WITH MEDIAL MENISECTOMY Left 04/17/2014   Procedure: LEFT KNEE ARTHROSCOPY WITH PARTIAL MEDIAL MENISCECTOMY AND CHONDROPLASTY;  Surgeon: Johnn Hai, MD;  Location: [facility];  Service: Orthopedics;  Laterality: Left;   TYMPANOSTOMY TUBE PLACEMENT Right     Social History:  reports that she has never smoked. She has never used smokeless tobacco. She reports previous alcohol use. She reports that she does not use drugs. Family History:  Family History  Problem Relation Age of Onset   Hypertension Mother    Arthritis Mother    Hypercalcemia Mother    Obesity Mother    Allergic rhinitis Mother    Asthma Mother    Hypertension Sister    Deep vein thrombosis Sister    Pulmonary embolism Sister    Asthma  Daughter    Diabetes Maternal Grandmother    Heart failure Maternal Grandmother    Heart disease Maternal Grandmother    Hyperlipidemia Maternal Grandmother    Hypertension Maternal Grandmother    Hypertension Maternal Grandfather    Hyperlipidemia Maternal Grandfather    Breast cancer Paternal Aunt        diagnosed in her 88's   Breast cancer Paternal Grandmother      HOME MEDICATIONS: Allergies as of 09/30/2020      Reactions   Linzess [linaclotide] Other (See Comments)   Abdominal pain    Pravastatin Other (See Comments)   Body aches   Adhesive [tape]    Pulls skin off   Atorvastatin    Muscle aches, stomach upset   Crestor [rosuvastatin]    Stomach pain   Latex Itching   Ozempic (0.25 Or 0.5 Mg-dose) [semaglutide(0.25 Or 0.5mg -dos)]    Stomach pain   Toradol [ketorolac Tromethamine]    Joint pain and swelling   Tramadol Other (See Comments)   JOINT PAIN   Semaglutide Nausea And Vomiting   Nausea, vomiting      Medication List       Accurate as of September 30, 2020  9:18 AM. If you have any questions, ask your nurse or doctor.        STOP taking these medications   neomycin-polymyxin b-dexamethasone 3.5-10000-0.1 Susp Commonly known as: MAXITROL Stopped by: Dorita Sciara, MD   NEOMYCIN-POLYMYXIN-HYDROCORTISONE 1 % Soln OTIC solution Commonly known as: CORTISPORIN Stopped by: Dorita Sciara, MD     TAKE these medications   cetirizine 10 MG tablet Commonly known as: ZYRTEC Take 1 tablet (10 mg total) by mouth 2 (two) times daily.   chlorhexidine 0.12 % solution Commonly known as: PERIDEX SMARTSIG:By Mouth   empagliflozin 10 MG Tabs tablet Commonly known as: Jardiance Take 1 tablet (10 mg total) by mouth daily.   FreeStyle Libre 2 Reader Devi UAD to check sugars.  Dx NIDDM, E11.65   FreeStyle Libre 2 Sensor Misc USE AS DIRECTED TO CHECK SUGARS DX E11.65, NIDDM   glipiZIDE 5 MG tablet Commonly known as: GLUCOTROL Take 1  tablet (5 mg total) by mouth 2 (two) times daily before a meal.   Kombiglyze XR 12-998 MG Tb24 Generic drug: Saxagliptin-Metformin TAKE 2 TABLETS BY MOUTH DAILY WITH SUPPER.   losartan 25 MG tablet Commonly known as: COZAAR TAKE 1 TABLET BY MOUTH EVERY DAY   montelukast 10 MG tablet Commonly known as: Singulair Take 1 tablet (10 mg total) by mouth at bedtime.   olopatadine 0.1 %  ophthalmic solution Commonly known as: PATANOL SMARTSIG:In Eye(s)         OBJECTIVE:   PHYSICAL EXAM: VS: BP 122/78    Pulse 90    Ht 5' 2.75" (1.594 m)    Wt 267 lb 2 oz (121.2 kg)    SpO2 98%    BMI 47.70 kg/m    EXAM: General: Pt appears well and is in NAD  Neck: General: Supple without adenopathy. Thyroid: Thyroid size normal.  No goiter or nodules appreciated. No thyroid bruit.  Lungs: Clear with good BS bilat with no rales, rhonchi, or wheezes  Heart: Auscultation: RRR.  Abdomen: Normoactive bowel sounds, soft, nontender, without masses or organomegaly palpable  Extremities:  BL LE: No pretibial edema normal ROM and strength.  Mental Status: Judgment, insight: Intact Orientation: Oriented to time, place, and person Mood and affect: No depression, anxiety, or agitation     DATA REVIEWED:  Results for CAMISHA, SREY (MRN [id number]) as of 10/01/2020 10:23  Ref. Range 09/30/2020 08:13  Sodium Latest Ref Range: 135 - 145 mEq/L 140  Potassium Latest Ref Range: 3.5 - 5.1 mEq/L 4.7  Chloride Latest Ref Range: 96 - 112 mEq/L 106  CO2 Latest Ref Range: 19 - 32 mEq/L 27  Glucose Latest Ref Range: 70 - 99 mg/dL 100 (H)  BUN Latest Ref Range: 6 - 23 mg/dL 18  Creatinine Latest Ref Range: 0.40 - 1.20 mg/dL 1.09  Calcium Latest Ref Range: 8.4 - 10.5 mg/dL 10.9 (H)  Alkaline Phosphatase Latest Ref Range: 39 - 117 U/L 68  Albumin Latest Ref Range: 3.5 - 5.2 g/dL 4.0  AST Latest Ref Range: 0 - 37 U/L 13  ALT Latest Ref Range: 0 - 35 U/L 22  Total Protein Latest Ref Range: 6.0 - 8.3 g/dL  7.5  Total Bilirubin Latest Ref Range: 0.2 - 1.2 mg/dL 0.4  GFR Latest Ref Range: >60.00 mL/min 56.00 (L)  MICROALB/CREAT RATIO Latest Ref Range: 0.0 - 30.0 mg/g 0.7  VITD Latest Ref Range: 30.00 - 100.00 ng/mL 23.70 (L)  Creatinine,U Latest Units: mg/dL 97.4  Microalb, Ur Latest Ref Range: 0.0 - 1.9 mg/dL <0.7     ASSESSMENT / PLAN / RECOMMENDATIONS:   1. Primary Hyperparathyroidism:  - Pt is asymptomatic  Patient needs further evaluation to determine surgical candidacy - So far her 24- hr urine collection showed less then 200 mg of calcium (06/2020) - CT scan of abdomen did not show any renal stones or nephrocalcinosis 94/2020 - Will proceed with DXA to rule out Osteoporosis   Recommendations:  - Please stay Hydrated    - AVOID CALCIUM SUPPLEMENTS, AVOID LOW CALCIUM DIET - Maintain normal dietary calcium intake (2-3 servings of dairy a day)  2. Type 2 Diabetes Mellitus, Poorly controlled - Most recent A1c of 11.7 %. Goal A1c < 7.0 %.    - Pt admits to dietary indiscretions and medication non-adherence in the past but has made dietary changes since the elevation of her A1c.  - She has been noted with hypoglycemia mis-day but turns out she has been taking Glipizide too close, she was advised to take it with breakfast and supper , rather then breakfast and lunch  - If she continues with hypoglycemia, she will notify us  - Intolerant to Ozempic due to abdominal apins   MEDICATIONS: - Glipizide 5 mg, 1 tablet before breakfast and 1 tablet before Supper  - Jardiance 10  Mg, 1 tablet before Breakfast  - Kombiglyze 2 tablet before [employer]  EDUCATION / INSTRUCTIONS:  BG monitoring instructions: Patient is instructed to check her blood sugars 3 times a day, before meals thrugh the CGM.  Call [facility] if: BG persistently < 70   I reviewed the Rule of 15 for the treatment of hypoglycemia in detail with the patient. Literature supplied.  3. Vitamin D  deficiency :  - Vitamin D continues to be low, currently on Vitamin D3 at 2000 iu daily. Will increase to    Vitamin D 2000 iu daily    F/U in 3 months     Signed electronically by: Mack Guise, MD  [facility]  [facility] [address] [place], [place] [address] Phone: [phone] FAX: [phone]      CC: Binnie Rail, MD [place] [address] Phone: [phone]  Fax: [phone]   Return to Endocrinology clinic as below: Future Appointments  Date Time Provider [place]  11/17/2020  5:30 PM Valentina Shaggy, MD [facility] None  12/15/2020  8:15 AM Binnie Rail, MD [facility] None  01/01/2021  7:30 AM Bard Haupert, Melanie Crazier, MD [facility] None

## 2020-09-30 NOTE — Patient Instructions (Addendum)
-   Please stay Hydrated    - AVOID CALCIUM SUPPLEMENTS, AVOID LOW CALCIUM DIET - Maintain normal dietary calcium intake (2-3 servings of dairy a day) - Glipizide 5 mg, 1 tablet before breakfast and 1 tablet before Supper  - Jardiance 10  Mg, 1 tablet before Breakfast  - Kombiglyze 2 tablet before Supper        HOW TO TREAT LOW BLOOD SUGARS (Blood sugar LESS THAN 70 MG/DL)  Please follow the RULE OF 15 for the treatment of hypoglycemia treatment (when your (blood sugars are less than 70 mg/dL)    STEP 1: Take 15 grams of carbohydrates when your blood sugar is low, which includes:   3-4 GLUCOSE TABS  OR  3-4 OZ OF JUICE OR REGULAR SODA OR  ONE TUBE OF GLUCOSE GEL     STEP 2: RECHECK blood sugar in 15 MINUTES STEP 3: If your blood sugar is still low at the 15 minute recheck --> then, go back to STEP 1 and treat AGAIN with another 15 grams of carbohydrates.

## 2020-10-01 LAB — PARATHYROID HORMONE, INTACT (NO CA): PTH: 87 pg/mL — ABNORMAL HIGH (ref 14–64)

## 2020-10-06 ENCOUNTER — Ambulatory Visit (INDEPENDENT_AMBULATORY_CARE_PROVIDER_SITE_OTHER)
Admission: RE | Admit: 2020-10-06 | Discharge: 2020-10-06 | Disposition: A | Discharge status: Home / Self Care | Payer: 59 | Source: Ambulatory Visit | Attending: Internal Medicine | Admitting: Internal Medicine

## 2020-10-06 ENCOUNTER — Other Ambulatory Visit: Payer: Self-pay

## 2020-10-06 DIAGNOSIS — E210 Primary hyperparathyroidism: Secondary | ICD-10-CM | POA: Diagnosis not present

## 2020-10-15 ENCOUNTER — Encounter: Payer: Self-pay | Admitting: Internal Medicine

## 2020-10-15 NOTE — Progress Notes (Signed)
Outside notes received. Information abstracted. Notes sent to scan.  

## 2020-11-17 ENCOUNTER — Ambulatory Visit: Payer: 59 | Admitting: Allergy & Immunology

## 2020-12-14 ENCOUNTER — Other Ambulatory Visit: Payer: Self-pay

## 2020-12-14 NOTE — Patient Instructions (Addendum)
Blood work was ordered.     Monitor your BP at home - it needs to less than 140/90.   Start moving every hour.   Medications changes include :   Increase jardiance to 25 mg daily.  Stop Glipizide.    Your prescription(s) have been submitted to your pharmacy. Please take as directed and contact our office if you believe you are having problem(s) with the medication(s).    Please followup in 3 months    Health Maintenance, Female Adopting a healthy lifestyle and getting preventive care are important in promoting health and wellness. Ask your health care provider about:  The right schedule for you to have regular tests and exams.  Things you can do on your own to prevent diseases and keep yourself healthy. What should I know about diet, weight, and exercise? Eat a healthy diet  Eat a diet that includes plenty of vegetables, fruits, low-fat dairy products, and lean protein.  Do not eat a lot of foods that are high in solid fats, added sugars, or sodium.   Maintain a healthy weight Body mass index (BMI) is used to identify weight problems. It estimates body fat based on height and weight. Your health care provider can help determine your BMI and help you achieve or maintain a healthy weight. Get regular exercise Get regular exercise. This is one of the most important things you can do for your health. Most adults should:  Exercise for at least 150 minutes each week. The exercise should increase your heart rate and make you sweat (moderate-intensity exercise).  Do strengthening exercises at least twice a week. This is in addition to the moderate-intensity exercise.  Spend less time sitting. Even light physical activity can be beneficial. Watch cholesterol and blood lipids Have your blood tested for lipids and cholesterol at 59 years of age, then have this test every 5 years. Have your cholesterol levels checked more often if:  Your lipid or cholesterol levels are high.  You  are older than 59 years of age.  You are at high risk for heart disease. What should I know about cancer screening? Depending on your health history and family history, you may need to have cancer screening at various ages. This may include screening for:  Breast cancer.  Cervical cancer.  Colorectal cancer.  Skin cancer.  Lung cancer. What should I know about heart disease, diabetes, and high blood pressure? Blood pressure and heart disease  High blood pressure causes heart disease and increases the risk of stroke. This is more likely to develop in people who have high blood pressure readings, are of African descent, or are overweight.  Have your blood pressure checked: ? Every 3-5 years if you are 72-55 years of age. ? Every year if you are 70 years old or older. Diabetes Have regular diabetes screenings. This checks your fasting blood sugar level. Have the screening done:  Once every three years after age 29 if you are at a normal weight and have a low risk for diabetes.  More often and at a younger age if you are overweight or have a high risk for diabetes. What should I know about preventing infection? Hepatitis B If you have a higher risk for hepatitis B, you should be screened for this virus. Talk with your health care provider to find out if you are at risk for hepatitis B infection. Hepatitis C Testing is recommended for:  Everyone born from [birth year] through 1964.  Anyone with known risk  factors for hepatitis C. Sexually transmitted infections (STIs)  Get screened for STIs, including gonorrhea and chlamydia, if: ? You are sexually active and are younger than 59 years of age. ? You are older than 59 years of age and your health care provider tells you that you are at risk for this type of infection. ? Your sexual activity has changed since you were last screened, and you are at increased risk for chlamydia or gonorrhea. Ask your health care provider if you are at  risk.  Ask your health care provider about whether you are at high risk for HIV. Your health care provider may recommend a prescription medicine to help prevent HIV infection. If you choose to take medicine to prevent HIV, you should first get tested for HIV. You should then be tested every 3 months for as long as you are taking the medicine. Pregnancy  If you are about to stop having your period (premenopausal) and you may become pregnant, seek counseling before you get pregnant.  Take 400 to 800 micrograms (mcg) of folic acid every day if you become pregnant.  Ask for birth control (contraception) if you want to prevent pregnancy. Osteoporosis and menopause Osteoporosis is a disease in which the bones lose minerals and strength with aging. This can result in bone fractures. If you are 14 years old or older, or if you are at risk for osteoporosis and fractures, ask your health care provider if you should:  Be screened for bone loss.  Take a calcium or vitamin D supplement to lower your risk of fractures.  Be given hormone replacement therapy (HRT) to treat symptoms of menopause. Follow these instructions at home: Lifestyle  Do not use any products that contain nicotine or tobacco, such as cigarettes, e-cigarettes, and chewing tobacco. If you need help quitting, ask your health care provider.  Do not use street drugs.  Do not share needles.  Ask your health care provider for help if you need support or information about quitting drugs. Alcohol use  Do not drink alcohol if: ? Your health care provider tells you not to drink. ? You are pregnant, may be pregnant, or are planning to become pregnant.  If you drink alcohol: ? Limit how much you use to 0-1 drink a day. ? Limit intake if you are breastfeeding.  Be aware of how much alcohol is in your drink. In the [place] one drink equals one 12 oz bottle of beer (355 mL), one 5 oz glass of wine (148 mL), or one 1 oz glass of hard liquor  (44 mL). General instructions  Schedule regular health, dental, and eye exams.  Stay current with your vaccines.  Tell your health care provider if: ? You often feel depressed. ? You have ever been abused or do not feel safe at home. Summary  Adopting a healthy lifestyle and getting preventive care are important in promoting health and wellness.  Follow your health care provider's instructions about healthy diet, exercising, and getting tested or screened for diseases.  Follow your health care provider's instructions on monitoring your cholesterol and blood pressure. This information is not intended to replace advice given to you by your health care provider. Make sure you discuss any questions you have with your health care provider. Document Revised: 07/18/2018 Document Reviewed: 07/18/2018 Elsevier Patient Education  2021 [employer].

## 2020-12-14 NOTE — Progress Notes (Signed)
Subjective:    Patient ID: Jill [name], female    DOB: [birth year], 60 y.o.   MRN: [id number]   This visit occurred during the SARS-CoV-2 public health emergency.  Safety protocols were in place, including screening questions prior to the visit, additional usage of staff PPE, and extensive cleaning of exam room while observing appropriate contact time as indicated for disinfecting solutions.    HPI She is here for a physical exam.   BP a couple of weeks ago was better.    Tired all the time.  She notices this with doing things around the house - has to rest after 10 minutes.  Her knees and ankles will throb -she knows there is OA there.  She denies other joint pain.  Hips hurt after sitting long periods-when she gets moving it feels better.  If she sits x 3-4 hours she can not get up.   Sugars typically 64-150, occasionally higher-, but when she checks her freestyle the average was in the 160s.  Medications and allergies reviewed with patient and updated if appropriate.  Patient Active Problem List   Diagnosis Date Noted  . Primary hyperparathyroidism ([place]) 09/30/2020  . Arthralgia 08/18/2020  . Hepatic steatosis 08/18/2020  . 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 Adult BMI 45.0-49.9 kg/sq m ([place]) 08/18/2020  . Diarrhea 04/14/2020  . Pruritus 04/12/2020  . Hypertension 01/13/2020  . Hyperlipidemia 01/13/2020  . COVID-19 virus infection 09/13/2019  . Vitamin D deficiency 04/08/2019  . Constipation 05/29/2017  . Hemorrhoids 05/29/2017  . Fibroids, intramural 05/03/2016  . Pelvic kidney   . Uncontrolled diabetes mellitus ([place])   . OBESITY, MORBID 07/30/2009  . OBSTRUCTIVE SLEEP APNEA 05/19/2009  . MOTION SICKNESS 03/12/2007  . ASTHMA 01/31/2007  . GERD 01/31/2007    Current Outpatient Medications on File Prior to Visit  Medication Sig Dispense Refill  . chlorhexidine (PERIDEX) 0.12 % solution SMARTSIG:By Mouth    . Continuous Blood Gluc Receiver (FREESTYLE  LIBRE 2 READER) DEVI UAD to check sugars.  Dx NIDDM, E11.65 1 each 0  . Continuous Blood Gluc Sensor (FREESTYLE LIBRE 2 SENSOR) MISC USE AS DIRECTED TO CHECK SUGARS DX E11.65, NIDDM    . KOMBIGLYZE XR 12-998 MG TB24 TAKE 2 TABLETS BY MOUTH DAILY WITH SUPPER. 60 tablet 5  . losartan (COZAAR) 25 MG tablet TAKE 1 TABLET BY MOUTH EVERY DAY 30 tablet 5  . olopatadine (PATANOL) 0.1 % ophthalmic solution SMARTSIG:In Eye(s)     No current facility-administered medications on file prior to visit.    Past Medical History:  Diagnosis Date  . Allergy   . Anemia   . Arthritis   . Asthma    87yrs ago  . Chronic deafness of both ears    left ear has 40% hearing-right ear has 60 % hearing  . Colon polyps   . Complication of anesthesia    Hard to wake up  . Constipation   . Diabetes mellitus without complication ([place])   . Heartburn   . High cholesterol   . Migraines    none in last 11 yrs  . Obesity   . Pelvic kidney    left  . Pneumonia   . Post partum depression   . Urinary incontinence     Past Surgical History:  Procedure Laterality Date  . CESAREAN SECTION    . COLONOSCOPY WITH PROPOFOL N/A 03/24/2015   Procedure: COLONOSCOPY WITH PROPOFOL;  Surgeon: Juanita Craver, MD;  Location: [facility];  Service: Endoscopy;  Laterality: N/A;  . HEMORRHOID BANDING  08/2017, 09/2017  . KNEE ARTHROSCOPY WITH MEDIAL MENISECTOMY Left 04/17/2014   Procedure: LEFT KNEE ARTHROSCOPY WITH PARTIAL MEDIAL MENISCECTOMY AND CHONDROPLASTY;  Surgeon: Johnn Hai, MD;  Location: [facility];  Service: Orthopedics;  Laterality: Left;  . TYMPANOSTOMY TUBE PLACEMENT Right     Social History   Socioeconomic History  . Marital status: Single    Spouse name: Not on file  . Number of children: 1  . Years of education: college   . Highest education level: Associate degree: academic program  Occupational History  . Occupation: social services case worker    Employer: [employer]  Tobacco Use  . Smoking  status: Never Smoker  . Smokeless tobacco: Never Used  Vaping Use  . Vaping Use: Never used  Substance and Sexual Activity  . Alcohol use: Not Currently  . Drug use: No  . Sexual activity: Not Currently    Partners: Male    Birth control/protection: Abstinence, I.U.D.    Comment: Mirena placed 05/2016  Other Topics Concern  . Not on file  Social History Narrative   Single. Education: [employer].    Social Determinants of Health   Financial Resource Strain: Not on file  Food Insecurity: Not on file  Transportation Needs: Not on file  Physical Activity: Not on file  Stress: Not on file  Social Connections: Not on file    Family History  Problem Relation Age of Onset  . Hypertension Mother   . Arthritis Mother   . Hypercalcemia Mother   . Obesity Mother   . Allergic rhinitis Mother   . Asthma Mother   . Hypertension Sister   . Deep vein thrombosis Sister   . Pulmonary embolism Sister   . Asthma Daughter   . Diabetes Maternal Grandmother   . Heart failure Maternal Grandmother   . Heart disease Maternal Grandmother   . Hyperlipidemia Maternal Grandmother   . Hypertension Maternal Grandmother   . Hypertension Maternal Grandfather   . Hyperlipidemia Maternal Grandfather   . Breast cancer Paternal Aunt        diagnosed in her 65's  . Breast cancer Paternal Grandmother     Review of Systems  Constitutional: Negative for chills and fever.  Eyes: Negative for visual disturbance.  Respiratory: Negative for cough, shortness of breath and wheezing.   Cardiovascular: Positive for leg swelling (mild). Negative for chest pain and palpitations.  Gastrointestinal: Negative for abdominal pain, blood in stool, constipation, diarrhea and nausea.       No gerd  Genitourinary: Negative for dysuria and hematuria.  Musculoskeletal: Positive for arthralgias (knees, ankles). Negative for back pain.  Skin: Negative for rash.       Dry skin  Neurological: Negative for light-headedness and  headaches.  Psychiatric/Behavioral: Negative for dysphoric mood. The patient is not nervous/anxious.        Objective:   Vitals:   12/15/20 0834 12/15/20 0919  BP: (!) 146/78 124/80  Pulse: 95   Temp: 98.6 F (37 C)   SpO2: 96%    Filed Weights   12/15/20 0834  Weight: 269 lb 6.4 oz (122.2 kg)   Body mass index is 48.1 kg/m.  BP Readings from Last 3 Encounters:  12/15/20 124/80  09/30/20 122/78  09/03/20 (!) 142/98    Wt Readings from Last 3 Encounters:  12/15/20 269 lb 6.4 oz (122.2 kg)  09/30/20 267 lb 2 oz (121.2 kg)  09/03/20 263 lb 3.2 oz (119.4 kg)  Physical Exam Constitutional: She appears well-developed and well-nourished. No distress.  HENT:  Head: Normocephalic and atraumatic.  Right Ear: External ear normal. Normal ear canal and TM Left Ear: External ear normal.  Normal ear canal and TM Mouth/Throat: Oropharynx is clear and moist.  Eyes: Conjunctivae and EOM are normal.  Neck: Neck supple. No tracheal deviation present. No thyromegaly present.  No carotid bruit  Cardiovascular: Normal rate, regular rhythm and normal heart sounds.   No murmur heard.  No edema. Pulmonary/Chest: Effort normal and breath sounds normal. No respiratory distress. She has no wheezes. She has no rales.  Breast: deferred   Abdominal: Soft. She exhibits no distension. There is no tenderness.  Lymphadenopathy: She has no cervical adenopathy.  Skin: Skin is warm and dry. She is not diaphoretic.  Psychiatric: She has a normal mood and affect. Her behavior is normal.        Assessment & Plan:   Physical exam: Screening blood work    ordered Immunizations  Had chicken pox vaccine at 36.  others up to date Colonoscopy  Up to date  Mammogram  Up to date  Gyn  Up to date  Eye exams  Up to date  Exercise  none Weight  Advised weight loss Substance abuse        See Problem List for Assessment and Plan of chronic medical problems.

## 2020-12-15 ENCOUNTER — Ambulatory Visit (INDEPENDENT_AMBULATORY_CARE_PROVIDER_SITE_OTHER): Payer: 59 | Admitting: Internal Medicine

## 2020-12-15 ENCOUNTER — Encounter: Payer: Self-pay | Admitting: Internal Medicine

## 2020-12-15 VITALS — BP 124/80 | HR 95 | Temp 98.6°F | Ht 62.75 in | Wt 269.4 lb

## 2020-12-15 DIAGNOSIS — I10 Essential (primary) hypertension: Secondary | ICD-10-CM | POA: Diagnosis not present

## 2020-12-15 DIAGNOSIS — E782 Mixed hyperlipidemia: Secondary | ICD-10-CM | POA: Diagnosis not present

## 2020-12-15 DIAGNOSIS — Z0000 Encounter for general adult medical examination without abnormal findings: Secondary | ICD-10-CM | POA: Diagnosis not present

## 2020-12-15 DIAGNOSIS — Z6841 Body Mass Index (BMI) 40.0 and over, adult: Secondary | ICD-10-CM

## 2020-12-15 DIAGNOSIS — E1165 Type 2 diabetes mellitus with hyperglycemia: Secondary | ICD-10-CM

## 2020-12-15 DIAGNOSIS — M2550 Pain in unspecified joint: Secondary | ICD-10-CM

## 2020-12-15 DIAGNOSIS — E559 Vitamin D deficiency, unspecified: Secondary | ICD-10-CM | POA: Diagnosis not present

## 2020-12-15 LAB — COMPREHENSIVE METABOLIC PANEL
ALT: 20 U/L (ref 0–35)
AST: 12 U/L (ref 0–37)
Albumin: 4.3 g/dL (ref 3.5–5.2)
Alkaline Phosphatase: 78 U/L (ref 39–117)
BUN: 30 mg/dL — ABNORMAL HIGH (ref 6–23)
CO2: 26 mEq/L (ref 19–32)
Calcium: 11.4 mg/dL — ABNORMAL HIGH (ref 8.4–10.5)
Chloride: 105 mEq/L (ref 96–112)
Creatinine, Ser: 1.16 mg/dL (ref 0.40–1.20)
GFR: 51.9 mL/min — ABNORMAL LOW (ref >60.00)
Glucose, Bld: 161 mg/dL — ABNORMAL HIGH (ref 70–99)
Potassium: 4.6 mEq/L (ref 3.5–5.1)
Sodium: 139 mEq/L (ref 135–145)
Total Bilirubin: 0.3 mg/dL (ref 0.2–1.2)
Total Protein: 8.2 g/dL (ref 6.0–8.3)

## 2020-12-15 LAB — HEMOGLOBIN A1C: Hgb A1c MFr Bld: 7.5 % — ABNORMAL HIGH (ref 4.6–6.5)

## 2020-12-15 LAB — LIPID PANEL
Cholesterol: 199 mg/dL (ref 0–200)
HDL: 46.2 mg/dL (ref >39.00)
LDL Cholesterol: 138 mg/dL — ABNORMAL HIGH (ref 0–99)
NonHDL: 152.56
Total CHOL/HDL Ratio: 4
Triglycerides: 75 mg/dL (ref 0.0–149.0)
VLDL: 15 mg/dL (ref 0.0–40.0)

## 2020-12-15 LAB — CBC WITH DIFFERENTIAL/PLATELET
Basophils Absolute: 0 10*3/uL (ref 0.0–0.1)
Basophils Relative: 0.5 % (ref 0.0–3.0)
Eosinophils Absolute: 0.1 10*3/uL (ref 0.0–0.7)
Eosinophils Relative: 0.7 % (ref 0.0–5.0)
HCT: 40.9 % (ref 36.0–46.0)
Hemoglobin: 13.5 g/dL (ref 12.0–15.0)
Lymphocytes Relative: 22.2 % (ref 12.0–46.0)
Lymphs Abs: 1.7 10*3/uL (ref 0.7–4.0)
MCHC: 33.1 g/dL (ref 30.0–36.0)
MCV: 82.3 fl (ref 78.0–100.0)
Monocytes Absolute: 0.5 10*3/uL (ref 0.1–1.0)
Monocytes Relative: 6 % (ref 3.0–12.0)
Neutro Abs: 5.4 10*3/uL (ref 1.4–7.7)
Neutrophils Relative %: 70.6 % (ref 43.0–77.0)
Platelets: 239 10*3/uL (ref 150.0–400.0)
RBC: 4.97 Mil/uL (ref 3.87–5.11)
RDW: 15.2 % (ref 11.5–15.5)
WBC: 7.6 10*3/uL (ref 4.0–10.5)

## 2020-12-15 LAB — TSH: TSH: 1.29 u[IU]/mL (ref 0.35–4.50)

## 2020-12-15 LAB — VITAMIN D 25 HYDROXY (VIT D DEFICIENCY, FRACTURES): VITD: 22.22 ng/mL — ABNORMAL LOW (ref 30.00–100.00)

## 2020-12-15 MED ORDER — EMPAGLIFLOZIN 25 MG PO TABS: 25.0000 mg | ORAL_TABLET | Freq: Every day | ORAL | 1 refills | Status: DC

## 2020-12-15 NOTE — Assessment & Plan Note (Signed)
Chronic Not ideally controlled Rapid sugars in the 160s per freestyle Continue kombiglyze xr 12-998 - 2 tabs daily Increase jardiance 25 mg daily Stop glipizide 5 mg bid Check A1c Stressed the importance of regular exercise and weight loss Stressed diabetic diet Follow-up in 3 months

## 2020-12-15 NOTE — Assessment & Plan Note (Addendum)
Chronic BP elevated initially-better on repeat.  She does have occasional spikes and I do worry if she is as well-controlled as she should be No change in medication Advised her to monitor at home Continue losartan 25 mg daily cmp

## 2020-12-15 NOTE — Assessment & Plan Note (Signed)
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

## 2020-12-15 NOTE — Assessment & Plan Note (Signed)
Chronic Following with sports medicine Stressed the importance of regular exercise and weight loss and helping

## 2020-12-15 NOTE — Assessment & Plan Note (Signed)
Chronic Taking vitamin D daily Check vitamin D level  

## 2020-12-15 NOTE — Assessment & Plan Note (Signed)
Chronic Discussed the importance of weight loss-to better improve blood pressure, diabetes and prevent liver cirrhosis given her hepatic steatosis Stressed the importance of increasing her activity-advised her to get up several times a day since she sits for hours Discussed decrease portions, diabetic diet Will discontinue glipizide and increase Jardiance, which will hopefully help Can also consider SGL 2 inhibitor in the future

## 2020-12-16 ENCOUNTER — Encounter: Payer: Self-pay | Admitting: Internal Medicine

## 2020-12-17 MED ORDER — EZETIMIBE 10 MG PO TABS: 10.0000 mg | ORAL_TABLET | Freq: Every day | ORAL | 3 refills | Status: DC

## 2021-01-01 ENCOUNTER — Ambulatory Visit: Payer: 59 | Admitting: Internal Medicine

## 2021-01-01 ENCOUNTER — Encounter: Payer: Self-pay | Admitting: Internal Medicine

## 2021-01-01 ENCOUNTER — Other Ambulatory Visit: Payer: Self-pay

## 2021-01-01 VITALS — BP 122/80 | HR 90 | Ht 62.75 in | Wt 267.5 lb

## 2021-01-01 DIAGNOSIS — Z794 Long term (current) use of insulin: Secondary | ICD-10-CM

## 2021-01-01 DIAGNOSIS — E210 Primary hyperparathyroidism: Secondary | ICD-10-CM | POA: Diagnosis not present

## 2021-01-01 DIAGNOSIS — E1165 Type 2 diabetes mellitus with hyperglycemia: Secondary | ICD-10-CM | POA: Diagnosis not present

## 2021-01-01 DIAGNOSIS — E1169 Type 2 diabetes mellitus with other specified complication: Secondary | ICD-10-CM | POA: Diagnosis not present

## 2021-01-01 MED ORDER — ROSUVASTATIN CALCIUM 5 MG PO TABS: 5.0000 mg | ORAL_TABLET | ORAL | 3 refills | Status: DC

## 2021-01-01 MED ORDER — ROSUVASTATIN CALCIUM 5 MG PO TABS: 5.0000 mg | ORAL_TABLET | Freq: Every day | ORAL | 3 refills | Status: DC

## 2021-01-01 MED ORDER — GLIPIZIDE 5 MG PO TABS: 2.5000 mg | ORAL_TABLET | Freq: Every day | ORAL | 1 refills | Status: DC

## 2021-01-01 NOTE — Patient Instructions (Addendum)
-   Please stay Hydrated    - AVOID CALCIUM SUPPLEMENTS, AVOID LOW CALCIUM DIET - Maintain normal dietary calcium intake (2-3 servings of dairy a day) - Continue Vitamin D 2000 iu daily    - Glipizide 5 mg, Half a tablet  before Supper  - Jardiance 25  Mg, 1 tablet before Breakfast  - Kombiglyze 2 tablet before Supper   - ONce done with Pravastatin, started Crestor once weekly      HOW TO TREAT LOW BLOOD SUGARS (Blood sugar LESS THAN 70 MG/DL)  Please follow the RULE OF 15 for the treatment of hypoglycemia treatment (when your (blood sugars are less than 70 mg/dL)    STEP 1: Take 15 grams of carbohydrates when your blood sugar is low, which includes:   3-4 GLUCOSE TABS  OR  3-4 OZ OF JUICE OR REGULAR SODA OR  ONE TUBE OF GLUCOSE GEL     STEP 2: RECHECK blood sugar in 15 MINUTES STEP 3: If your blood sugar is still low at the 15 minute recheck --> then, go back to STEP 1 and treat AGAIN with another 15 grams of carbohydrates.

## 2021-01-01 NOTE — Progress Notes (Signed)
Name: Jill [name]  MRN/ DOB: [id number], 11/18/1960    Age/ Sex: 60 y.o., female     PCP: Binnie Rail, MD   Reason for Endocrinology Evaluation: Hypercalcemia      Initial Endocrinology Clinic Visit: 06/25/2020    PATIENT IDENTIFIER: Jill [name] is a 60 y.o., female with a past medical history of Dyslipidemia, HTN and Asthma . She has followed with [facility] since 06/25/2020 for consultative assistance with management of her hypercalcemia .   HISTORICAL SUMMARY: The patient was first diagnosed with hypercalcemia in 2015, with maximum serum calcium level of  level 11.2 mg/dL ( Non corrected)  She denies  history of kidney stones, kidney disease, liver disease, granulomatous disease. She denies osteoporosis, had a broken knee cap as a child    Her 24- hr urinary Ca/Cr ratio of 0.012 with a 191 mg of 24-hr calcium (06/2020) DXA (10/06/2020) low bone density    DIABETES HISTORY:  She was diagnosed with DM ~ 30 yrs ago.  She has tried Ozempic but caused GI  Symptoms . Has been on tradjenta in the past as well without reported intolerance .Jardiance and Glipizide  Were  started 08/2020  Her A1c has ranged from 6.7% in 2019 to 11.7% in 2022.    SUBJECTIVE:    Today (01/01/2021):  Jill [name] is here for hypercalcemia and diabetes management. She checks her glucose multiple times a day through CGM. She has had hypoglycemic episodes , occurring 3x a week without a pattern.   She has been staying hydrated  She consumes 2 servings of calcium daily  She has been avoiding OTC calcium tablets   She was started on Zetia by PCP for dyslipidemia, but this was cost prohibitive   She forgets to take Vitamin D  Glipizide stopped by PCP   Jardiance 25 mg  Glipizide 5 mg BID - stopped  Kombiglyze XR,  12-998 2 tabs at dinner  Vitamin D 2000 iu daily       CONTINUOUS GLUCOSE MONITORING RECORD INTERPRETATION    Dates of Recording:  5/14-5/27/2022  Sensor description:freestyle libre  Results statistics:   CGM use % of time 95  Average and SD 145/37.3  Time in range     75 %  % Time Above 180 17  % Time above 250 5  % Time Below target 3    Glycemic patterns summary: hyperglycemia noted after meals Hyperglycemic episodes  Post prandial   Hypoglycemic episodes occurred mid day /night   Overnight periods: trends down      HISTORY:  Past Medical History:  Past Medical History:  Diagnosis Date  . Allergy   . Anemia   . Arthritis   . Asthma    37yrs ago  . Chronic deafness of both ears    left ear has 40% hearing-right ear has 60 % hearing  . Colon polyps   . Complication of anesthesia    Hard to wake up  . Constipation   . Diabetes mellitus without complication ([place])   . Heartburn   . High cholesterol   . Migraines    none in last 11 yrs  . Obesity   . Pelvic kidney    left  . Pneumonia   . Post partum depression   . Urinary incontinence    Past Surgical History:  Past Surgical History:  Procedure Laterality Date  . CESAREAN SECTION    . COLONOSCOPY WITH PROPOFOL N/A 03/24/2015  Procedure: COLONOSCOPY WITH PROPOFOL;  Surgeon: Juanita Craver, MD;  Location: [facility];  Service: Endoscopy;  Laterality: N/A;  . HEMORRHOID BANDING  08/2017, 09/2017  . KNEE ARTHROSCOPY WITH MEDIAL MENISECTOMY Left 04/17/2014   Procedure: LEFT KNEE ARTHROSCOPY WITH PARTIAL MEDIAL MENISCECTOMY AND CHONDROPLASTY;  Surgeon: Johnn Hai, MD;  Location: [facility];  Service: Orthopedics;  Laterality: Left;  . TYMPANOSTOMY TUBE PLACEMENT Right    Social History:  reports that she has never smoked. She has never used smokeless tobacco. She reports previous alcohol use. She reports that she does not use drugs. Family History:  Family History  Problem Relation Age of Onset  . Hypertension Mother   . Arthritis Mother   . Hypercalcemia Mother   . Obesity Mother   . Allergic rhinitis Mother   . Asthma Mother   .  Hypertension Sister   . Deep vein thrombosis Sister   . Pulmonary embolism Sister   . Asthma Daughter   . Diabetes Maternal Grandmother   . Heart failure Maternal Grandmother   . Heart disease Maternal Grandmother   . Hyperlipidemia Maternal Grandmother   . Hypertension Maternal Grandmother   . Hypertension Maternal Grandfather   . Hyperlipidemia Maternal Grandfather   . Breast cancer Paternal Aunt        diagnosed in her 27's  . Breast cancer Paternal Grandmother      HOME MEDICATIONS: Allergies as of 01/01/2021      Reactions   Linzess [linaclotide] Other (See Comments)   Abdominal pain    Pravastatin Other (See Comments)   Body aches   Adhesive [tape]    Pulls skin off   Atorvastatin    Muscle aches, stomach upset   Crestor [rosuvastatin]    Stomach pain   Latex Itching   Ozempic (0.25 Or 0.5 Mg-dose) [semaglutide(0.25 Or 0.5mg -dos)]    Stomach pain   Toradol [ketorolac Tromethamine]    Joint pain and swelling   Tramadol Other (See Comments)   JOINT PAIN   Semaglutide Nausea And Vomiting   Nausea, vomiting      Medication List       Accurate as of Jan 01, 2021  7:45 AM. If you have any questions, ask your nurse or doctor.        chlorhexidine 0.12 % solution Commonly known as: PERIDEX SMARTSIG:By Mouth   empagliflozin 25 MG Tabs tablet Commonly known as: Jardiance Take 1 tablet (25 mg total) by mouth daily before breakfast.   ezetimibe 10 MG tablet Commonly known as: Zetia Take 1 tablet (10 mg total) by mouth daily.   FreeStyle Libre 2 Reader Devi UAD to check sugars.  Dx NIDDM, E11.65   FreeStyle Libre 2 Sensor Misc USE AS DIRECTED TO CHECK SUGARS DX E11.65, NIDDM   Kombiglyze XR 12-998 MG Tb24 Generic drug: Saxagliptin-Metformin TAKE 2 TABLETS BY MOUTH DAILY WITH SUPPER.   losartan 25 MG tablet Commonly known as: COZAAR TAKE 1 TABLET BY MOUTH EVERY DAY   olopatadine 0.1 % ophthalmic solution Commonly known as: PATANOL SMARTSIG:In  Eye(s)         OBJECTIVE:   PHYSICAL EXAM: VS: BP 122/80   Pulse 90   Ht 5' 2.75" (1.594 m)   Wt 267 lb 8 oz (121.3 kg)   SpO2 98%   BMI 47.76 kg/m    EXAM: General: Pt appears well and is in NAD  Neck: General: Supple without adenopathy. Thyroid: Thyroid size normal.  No goiter or nodules appreciated. No thyroid bruit.  Lungs:  Clear with good BS bilat with no rales, rhonchi, or wheezes  Heart: Auscultation: RRR.  Abdomen: Normoactive bowel sounds, soft, nontender, without masses or organomegaly palpable  Extremities:  BL LE: No pretibial edema normal ROM and strength.  Mental Status: Judgment, insight: Intact Orientation: Oriented to time, place, and person Mood and affect: No depression, anxiety, or agitation     DATA REVIEWED:  Results for JAMES, LAFALCE (MRN [id number]) as of 10/01/2020 10:23  Ref. Range 09/30/2020 08:13  Sodium Latest Ref Range: 135 - 145 mEq/L 140  Potassium Latest Ref Range: 3.5 - 5.1 mEq/L 4.7  Chloride Latest Ref Range: 96 - 112 mEq/L 106  CO2 Latest Ref Range: 19 - 32 mEq/L 27  Glucose Latest Ref Range: 70 - 99 mg/dL 100 (H)  BUN Latest Ref Range: 6 - 23 mg/dL 18  Creatinine Latest Ref Range: 0.40 - 1.20 mg/dL 1.09  Calcium Latest Ref Range: 8.4 - 10.5 mg/dL 10.9 (H)  Alkaline Phosphatase Latest Ref Range: 39 - 117 U/L 68  Albumin Latest Ref Range: 3.5 - 5.2 g/dL 4.0  AST Latest Ref Range: 0 - 37 U/L 13  ALT Latest Ref Range: 0 - 35 U/L 22  Total Protein Latest Ref Range: 6.0 - 8.3 g/dL 7.5  Total Bilirubin Latest Ref Range: 0.2 - 1.2 mg/dL 0.4  GFR Latest Ref Range: >60.00 mL/min 56.00 (L)  MICROALB/CREAT RATIO Latest Ref Range: 0.0 - 30.0 mg/g 0.7  VITD Latest Ref Range: 30.00 - 100.00 ng/mL 23.70 (L)  Creatinine,U Latest Units: mg/dL 97.4  Microalb, Ur Latest Ref Range: 0.0 - 1.9 mg/dL <0.7     ASSESSMENT / PLAN / RECOMMENDATIONS:   Primary Hyperparathyroidism:  - Pt is asymptomatic  Patient needs further evaluation  to determine surgical candidacy - So far her 24- hr urine collection showed less then 200 mg of calcium (06/2020) - CT scan of abdomen did not show any renal stones or nephrocalcinosis 94/2020 - Bone density did not show any evidence of osteoporosis -Patient meets surgical criteria for parathyroidectomy due to low GFR, will refer to surgery for further evaluation   Recommendations:  - Please stay Hydrated    - AVOID CALCIUM SUPPLEMENTS, AVOID LOW CALCIUM DIET - Maintain normal dietary calcium intake (2-3 servings of dairy a day) -Continue vitamin D 2000 IU daily    2. Type 2 Diabetes Mellitus, suboptimally controlled - Most recent A1c of 7.5%. Goal A1c < 7.0 %.    -I have praised the patient on improved glycemic control -She is having recurrent hypoglycemia, her PCP recently stopped the glipizide, but in review of her BG's she gets postprandial hyperglycemia in the 200s mainly at suppertime, I have suggested restarting a small dose of glipizide with supper only.  Patient will stop this if she continues to have hypoglycemia overnight   - Intolerant to Ozempic due to abdominal pains   MEDICATIONS: -Glipizide 5 mg, Half a tablet  before Supper  - Jardiance 25  Mg, 1 tablet before Breakfast  - Kombiglyze 2 tablet before Supper    EDUCATION / INSTRUCTIONS: BG monitoring instructions: Patient is instructed to check her blood sugars 3 times a day, before meals thrugh the CGM. Call [facility] if: BG persistently < 70  I reviewed the Rule of 15 for the treatment of hypoglycemia in detail with the patient. Literature supplied.    3. Dyslipidemia:  -She has reported intolerance to daily rosuvastatin and atorvastatin, she was started on Zetia through her PCP but this  has been cost prohibitive and she is unable to pay $40 as she is already paying for the freestyle libre sensors. -She had restarted her pravastatin, we discussed cardiovascular benefits of statins, and I  have recommended trying rosuvastatin once weekly to see if that would help with her aches and pains -I also have encouraged her to take vitamin D as low vitamin D can exacerbate myalgias with statins  Medication Stop pravastatin Start rosuvastatin 5 mg weekly    F/U in 6 months     Signed electronically by: Mack Guise, MD  [facility]  [address] [place], [place] [address] Phone: [phone] FAX: [phone]      CC: Binnie Rail, MD [place] [address] Phone: [phone]  Fax: [phone]   Return to Endocrinology clinic as below: Future Appointments  Date Time Provider [place]  01/26/2021  5:45 PM Valentina Shaggy, MD [facility] None  03/17/2021  7:50 AM Binnie Rail, MD [facility] None  06/01/2021  9:45 AM Megan Salon, MD [facility]

## 2021-01-07 ENCOUNTER — Encounter: Payer: Self-pay | Admitting: Internal Medicine

## 2021-01-07 ENCOUNTER — Other Ambulatory Visit: Payer: Self-pay | Admitting: Internal Medicine

## 2021-01-13 ENCOUNTER — Other Ambulatory Visit: Payer: Self-pay | Admitting: Internal Medicine

## 2021-01-26 ENCOUNTER — Encounter: Payer: Self-pay | Admitting: Allergy & Immunology

## 2021-01-26 ENCOUNTER — Ambulatory Visit: Payer: 59 | Admitting: Allergy & Immunology

## 2021-01-26 ENCOUNTER — Other Ambulatory Visit: Payer: Self-pay

## 2021-01-26 VITALS — BP 134/76 | HR 79 | Temp 98.0°F | Resp 16 | Ht 61.0 in | Wt 267.6 lb

## 2021-01-26 DIAGNOSIS — H1010 Acute atopic conjunctivitis, unspecified eye: Secondary | ICD-10-CM

## 2021-01-26 DIAGNOSIS — L299 Pruritus, unspecified: Secondary | ICD-10-CM | POA: Diagnosis not present

## 2021-01-26 DIAGNOSIS — R059 Cough, unspecified: Secondary | ICD-10-CM | POA: Diagnosis not present

## 2021-01-26 MED ORDER — CETIRIZINE HCL 10 MG PO TABS: 10.0000 mg | ORAL_TABLET | Freq: Every day | ORAL | 3 refills | Status: DC

## 2021-01-26 MED ORDER — TRIAMCINOLONE ACETONIDE 0.1 % EX OINT: 1.0000 "application " | TOPICAL_OINTMENT | Freq: Two times a day (BID) | CUTANEOUS | 3 refills | Status: DC

## 2021-01-26 NOTE — Progress Notes (Signed)
FOLLOW UP  Date of Service/Encounter:  01/26/21   Assessment:   Pruritus - much improved   History of albuterol use - with normal spirometry   Plan/Recommendations:   1. Pruritus - with sensitization to tree pollen - Continue with cetirizine 20mg  once daily AS NEEDED.  - We sent in triamcinolone twice daily as needed (I sent in large tubs).  2. Cough - with history of wheezing with COVID19 - Lung testing looks great today. - We do not need to make any medication changes at all.   3. Return in about 1 year (around 01/26/2022).   Subjective:   Jill [name] is a 60 y.o. female presenting today for follow up of  Chief Complaint  Patient presents with   Pruritus    Has stopped and has been off the medication for a least 4 months. Not sure the cause.    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  Jill [name] is a 60 y.o. female presenting for a follow up visit.  She was last seen in October 2021.  At that time, the pruritus continued.  We continued with cetirizine 20 mg once daily as well as Singulair 10 mg daily.  We did add on Patanol 1 drop per eye twice daily as needed and continue with her moisturizing.  We did  get some repeat inflammatory markers.   We obtained antithyroid antibodies as well.  CRP was trending down.  Thyroid antibodies were negative.  Since the last visit, her itching has completely resolved. She stopped all of her medications and there is no itching at all. She thinks that it might have been stressed. She thinks that she might have just needed to "tank up" on antihistamines and get them to a steady state in her body.   Asthma/Respiratory Symptom History: She never had asthma diagnosed, but she did have albuterol when she had COVID19 in February 2021. She had a cough for 2-3 months in total. She is vaccinated and she needs another booster.   She saw Jolene Provost, PA, for evaluation of her hearing loss and ear fullness.  She had a cerumen impaction on the right and was treated with ciprofloxacin eardrops to break this up.  She is following up in a couple of weeks.  She has a history of menorrhagia that was stopped with the insertion of an IUD.  Uterine biopsies were negative for uterine cancer.  She is planning to work 1 more year before retiring.  She would like to do a lot of traveling and is hopeful that COVID-19 will be even less of an issue next year.  Otherwise, there have been no changes to her past medical history, surgical history, family history,  or social history.    Review of Systems  Constitutional: Negative.  Negative for fever, malaise/fatigue and weight loss.  HENT: Negative.  Negative for congestion, ear discharge and ear pain.   Eyes:  Negative for pain, discharge and redness.  Respiratory:  Negative for cough, sputum production, shortness of breath and wheezing.   Cardiovascular: Negative.  Negative for chest pain and palpitations.  Gastrointestinal:  Negative for abdominal pain and heartburn.  Skin: Negative.  Negative for itching and rash.  Neurological:  Negative for dizziness and headaches.  Endo/Heme/Allergies:  Negative for environmental allergies. Does  not bruise/bleed easily.      Objective:   Blood pressure 134/76, pulse 79, temperature 98 F (36.7 C), resp. rate 16, height 5\' 1"  (1.549 m), weight 267 lb 9.6 oz (121.4 kg), SpO2 96 %. Body mass index is 50.56 kg/m.   Physical Exam:  Physical Exam Constitutional:      Appearance: She is well-developed. She is obese.  HENT:     Head: Normocephalic and atraumatic.     Right Ear: Tympanic membrane, ear canal and external ear normal.     Left Ear: Tympanic membrane, ear canal and external ear normal.     Nose: No nasal deformity, septal deviation, mucosal edema or rhinorrhea.     Right Turbinates: Not enlarged or swollen.     Left Turbinates: Not enlarged or swollen.     Right Sinus: No maxillary sinus tenderness or frontal sinus tenderness.     Left Sinus: No maxillary sinus tenderness or frontal sinus tenderness.     Mouth/Throat:     Mouth: Mucous membranes are not pale and not dry.     Pharynx: Uvula midline.  Eyes:     General: Lids are normal. No allergic shiner.       Right eye: No discharge.        Left eye: No discharge.     Conjunctiva/sclera: Conjunctivae normal.     Right eye: Right conjunctiva is not injected. No chemosis.    Left eye: Left conjunctiva is not injected. No chemosis.    Pupils: Pupils are equal, round, and reactive to light.  Cardiovascular:     Rate and Rhythm: Normal rate and regular rhythm.     Heart sounds: Normal heart sounds.  Pulmonary:     Effort: Pulmonary effort is normal. No tachypnea, accessory muscle usage or respiratory distress.     Breath sounds: Normal breath sounds. No wheezing, rhonchi or rales.     Comments: Moving air well in all lung fields.  No increased work of breathing. Chest:     Chest wall: No tenderness.  Lymphadenopathy:     Cervical: No cervical adenopathy.  Skin:    General: Skin is warm.     Capillary Refill: Capillary refill takes less than 2 seconds.     Coloration: Skin is not pale.     Findings: No  abrasion, erythema, petechiae or rash. Rash is not papular, urticarial or vesicular.     Comments: Thickened skin on the bilateral elbows.  There are some excoriations.  Overall, her skin looks much better than previous exams.  Neurological:     Mental Status: She is alert.  Psychiatric:        Behavior: Behavior is cooperative.     Diagnostic studies:    Spirometry: results normal (FEV1: 1.79/95%, FVC: 2.03/85%, FEV1/FVC: 88%).    Spirometry consistent with normal pattern.   Allergy Studies: none  Salvatore Marvel, MD  [facility]

## 2021-01-26 NOTE — Patient Instructions (Addendum)
Jill [name]  1. Pruritus - with sensitization to tree pollen - Continue with cetirizine 20mg  once daily AS NEEDED.  - We sent in triamcinolone twice daily as needed (I sent in large tubs).  2. Cough - with history of wheezing with COVID19 - Lung testing looks great today. - We do not need to make any medication changes at all.   3. Return in about 1 year (around 01/26/2022).   Please inform us of any Emergency Department visits, hospitalizations, or changes in symptoms. Call us before going to the ED for breathing or allergy symptoms since we might be able to fit you in for a sick visit. Feel free to contact us anytime with any questions, problems, or concerns.  It was a pleasure to see you again today!  Websites that have reliable patient information: 1. American Academy of Asthma, Allergy, and Immunology: www.aaaai.org 2. Food Allergy Research and Education (FARE): foodallergy.org 3. Mothers of Asthmatics: http://www.asthmacommunitynetwork.org 4. American College of Allergy, Asthma, and Immunology: www.acaai.org   COVID-19 Vaccine Information can be found at: ShippingScam.co.uk For questions related to vaccine distribution or appointments, please email vaccine@[email or web address] .[email or web address] or call [phone].     "Like" [place] on Facebook and Instagram for our latest updates!      Make sure you are registered to vote! If you have moved or changed any of your contact information, you will need to get this updated before voting!  In some cases, you MAY be able to register to vote online: [email or web address]

## 2021-02-20 ENCOUNTER — Other Ambulatory Visit: Payer: Self-pay | Admitting: Internal Medicine

## 2021-03-03 ENCOUNTER — Other Ambulatory Visit (HOSPITAL_COMMUNITY): Payer: Self-pay | Admitting: Surgery

## 2021-03-03 ENCOUNTER — Other Ambulatory Visit: Payer: Self-pay | Admitting: Surgery

## 2021-03-03 DIAGNOSIS — E210 Primary hyperparathyroidism: Secondary | ICD-10-CM

## 2021-03-03 DIAGNOSIS — E215 Disorder of parathyroid gland, unspecified: Secondary | ICD-10-CM

## 2021-03-10 ENCOUNTER — Other Ambulatory Visit: Payer: Self-pay

## 2021-03-10 ENCOUNTER — Encounter (HOSPITAL_COMMUNITY)
Admission: RE | Admit: 2021-03-10 | Discharge: 2021-03-10 | Disposition: A | Discharge status: Home / Self Care | Payer: 59 | Source: Ambulatory Visit | Attending: Surgery | Admitting: Surgery

## 2021-03-10 ENCOUNTER — Ambulatory Visit (HOSPITAL_COMMUNITY)
Admission: RE | Admit: 2021-03-10 | Discharge: 2021-03-10 | Disposition: A | Discharge status: Home / Self Care | Payer: 59 | Source: Ambulatory Visit | Attending: Surgery | Admitting: Surgery

## 2021-03-10 DIAGNOSIS — E210 Primary hyperparathyroidism: Secondary | ICD-10-CM

## 2021-03-10 DIAGNOSIS — E215 Disorder of parathyroid gland, unspecified: Secondary | ICD-10-CM | POA: Diagnosis present

## 2021-03-10 MED ORDER — TECHNETIUM TC 99M SESTAMIBI GENERIC - CARDIOLITE
26.3000 | Freq: Once | INTRAVENOUS | Status: AC | PRN
  Administered 2021-03-10: 26.3 via INTRAVENOUS

## 2021-03-10 NOTE — Progress Notes (Signed)
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  We will compare this to the nuclear sestamibi scan.  There is a mildly suspicious thyroid nodule in the left lobe measuring 2.9 cm in size that will require FNA biopsy.  I will ask Claiborne Billings to arrange this biopsy in the near future.  We want those results before scheduling parathyroid surgery.  Armandina Gemma, MD [facility] [place] practice Office: [phone]

## 2021-03-12 NOTE — Progress Notes (Signed)
Sestamibi scan is negative for adenoma.  USN suggests a left side parathyroid adenoma but is not conclusive.  Will request 4D-CT scan with parathyroid protocol to hopefully confirm the adenoma on the left side.  Claiborne Billings - please arrange this study.  [place], MD [facility] [place] practice Office: [phone]

## 2021-03-17 ENCOUNTER — Ambulatory Visit: Payer: 59 | Admitting: Internal Medicine

## 2021-03-24 ENCOUNTER — Other Ambulatory Visit: Payer: Self-pay | Admitting: Surgery

## 2021-03-24 DIAGNOSIS — E210 Primary hyperparathyroidism: Secondary | ICD-10-CM

## 2021-03-26 ENCOUNTER — Other Ambulatory Visit: Payer: Self-pay | Admitting: Surgery

## 2021-03-26 ENCOUNTER — Encounter: Payer: Self-pay | Admitting: Internal Medicine

## 2021-03-26 DIAGNOSIS — E041 Nontoxic single thyroid nodule: Secondary | ICD-10-CM

## 2021-04-01 ENCOUNTER — Other Ambulatory Visit: Payer: Self-pay

## 2021-04-05 ENCOUNTER — Other Ambulatory Visit: Payer: Self-pay

## 2021-04-05 MED ORDER — FREESTYLE TEST VI STRP: ORAL_STRIP | 12 refills | Status: DC

## 2021-04-13 ENCOUNTER — Ambulatory Visit
Admission: RE | Admit: 2021-04-13 | Discharge: 2021-04-13 | Disposition: A | Discharge status: Home / Self Care | Payer: 59 | Source: Ambulatory Visit | Attending: Surgery | Admitting: Surgery

## 2021-04-13 DIAGNOSIS — E210 Primary hyperparathyroidism: Secondary | ICD-10-CM

## 2021-04-13 MED ORDER — IOPAMIDOL (ISOVUE-300) INJECTION 61%
75.0000 mL | Freq: Once | INTRAVENOUS | Status: AC | PRN
  Administered 2021-04-13: 75 mL via INTRAVENOUS

## 2021-04-15 ENCOUNTER — Other Ambulatory Visit (HOSPITAL_COMMUNITY)
Admission: RE | Admit: 2021-04-15 | Discharge: 2021-04-15 | Disposition: A | Discharge status: Home / Self Care | Payer: 59 | Source: Ambulatory Visit | Attending: Surgery | Admitting: Surgery

## 2021-04-15 ENCOUNTER — Ambulatory Visit
Admission: RE | Admit: 2021-04-15 | Discharge: 2021-04-15 | Disposition: A | Discharge status: Home / Self Care | Payer: 59 | Source: Ambulatory Visit | Attending: Surgery | Admitting: Surgery

## 2021-04-15 DIAGNOSIS — D34 Benign neoplasm of thyroid gland: Secondary | ICD-10-CM | POA: Diagnosis not present

## 2021-04-15 DIAGNOSIS — E041 Nontoxic single thyroid nodule: Secondary | ICD-10-CM

## 2021-04-15 NOTE — Progress Notes (Signed)
4D-CT does not confirm location of parathyroid adenoma.  Await FNA biopsy of thyroid later today.  Armandina Gemma, MD [facility] [place] practice Office: [phone]

## 2021-04-16 LAB — CYTOLOGY - NON PAP

## 2021-04-17 NOTE — Progress Notes (Signed)
USN guided FNA biopsy is benign.  Will need to schedule one year follow up USN and TSH level followed by office visit in order to follow up thyroid nodules.  As for primary hyperparathyroidism, all imaging studies are essentially negative.  Claiborne Billings - please schedule patient for follow up office visit in near future to discuss results and options for surgery.  [place], MD [facility] [place] practice Office: [phone]

## 2021-04-22 ENCOUNTER — Ambulatory Visit: Payer: Self-pay | Admitting: Surgery

## 2021-05-14 ENCOUNTER — Encounter (HOSPITAL_COMMUNITY): Payer: 59

## 2021-05-17 NOTE — Progress Notes (Addendum)
COVID swab appointment: n/a  COVID Vaccine Completed: yes x3 Date COVID Vaccine completed: 10/19/19, 11/12/19 Has received booster: 08/19/20 COVID vaccine manufacturer: Pfizer      Date of COVID positive in last 90 days: no  PCP - Celso Amy, MD Cardiologist - n/a  Chest x-ray - n/a EKG - 05/18/21 Epic/chart Stress Test - n/a ECHO - long time per pt Cardiac Cath - n/a Pacemaker/ICD device last checked:n/a Spinal Cord Stimulator: n/a  Sleep Study - yes positive CPAP - no  Fasting Blood Sugar - 120-170 Checks Blood Sugar- not checking right now due to funds, has free style libre, not on right now  Blood Thinner Instructions: n/a Aspirin Instructions: Last Dose:  Activity level: Can go up a flight of stairs and perform activities of daily living without stopping and without symptoms of chest pain or shortness of breath. Has torn left knee that 'gives out' sometimes        Anesthesia review: HTN, DM, A1C 8.5, hepatic stenosis  Patient denies shortness of breath, fever, cough and chest pain at PAT appointment   Patient verbalized understanding of instructions that were given to them at the PAT appointment. Patient was also instructed that they will need to review over the PAT instructions again at home before surgery.

## 2021-05-17 NOTE — Patient Instructions (Addendum)
DUE TO COVID-19 ONLY ONE VISITOR IS ALLOWED TO COME WITH YOU AND STAY IN THE WAITING ROOM ONLY DURING PRE OP AND PROCEDURE.   **NO VISITORS ARE ALLOWED IN THE SHORT STAY AREA OR RECOVERY ROOM!!**   Your procedure is scheduled on: 05/24/21   Report to [facility] Main Entrance   Report to Short Stay at 5:15 AM   [facility])   Call this number if you have problems the morning of surgery [phone]   Do not eat food :After Midnight.   May have liquids until 4:30 AM day of surgery  CLEAR LIQUID DIET  Foods Allowed                                                                     Foods Excluded  Water, Black Coffee and tea (no milk or creamer)           liquids that you cannot  Plain Jell-O in any flavor  (No red)                                    see through such as: Fruit ices (not with fruit pulp)                                           milk, soups, orange juice              Iced Popsicles (No red)                                               All solid food                                   Apple juices Sports drinks like Gatorade (No red) Lightly seasoned clear broth or consume(fat free) Sugar    Oral Hygiene is also important to reduce your risk of infection.                                    Remember - BRUSH YOUR TEETH THE MORNING OF SURGERY WITH YOUR REGULAR TOOTHPASTE   Take these medicines the morning of surgery with A SIP OF WATER: Zyrtec  DO NOT TAKE ANY ORAL DIABETIC MEDICATIONS DAY OF YOUR SURGERY  How to Manage Your Diabetes Before and After Surgery  Why is it important to control my blood sugar before and after surgery? Improving blood sugar levels before and after surgery helps healing and can limit problems. A way of improving blood sugar control is eating a healthy diet by:  Eating less sugar and carbohydrates  Increasing activity/exercise  Talking with your doctor about reaching your blood sugar goals High blood sugars (greater than 180  mg/dL) can raise your risk of infections and slow your recovery, so you will need  to focus on controlling your diabetes during the weeks before surgery. Make sure that the doctor who takes care of your diabetes knows about your planned surgery including the date and location.  How do I manage my blood sugar before surgery? Check your blood sugar at least 4 times a day, starting 2 days before surgery, to make sure that the level is not too high or low. Check your blood sugar the morning of your surgery when you wake up and every 2 hours until you get to the Short Stay unit. If your blood sugar is less than 70 mg/dL, you will need to treat for low blood sugar: Do not take insulin. Treat a low blood sugar (less than 70 mg/dL) with  cup of clear juice (cranberry or apple), 4 glucose tablets, OR glucose gel. Recheck blood sugar in 15 minutes after treatment (to make sure it is greater than 70 mg/dL). If your blood sugar is not greater than 70 mg/dL on recheck, call [phone] for further instructions. Report your blood sugar to the short stay nurse when you get to Short Stay.  If you are admitted to the hospital after surgery: Your blood sugar will be checked by the staff and you will probably be given insulin after surgery (instead of oral diabetes medicines) to make sure you have good blood sugar levels. The goal for blood sugar control after surgery is 80-180 mg/dL.   WHAT DO I DO ABOUT MY DIABETES MEDICATION?  Do not take oral diabetes medicines (pills) the morning of surgery.  THE DAY BEFORE SURGERY, Do not take any diabetic medications       THE MORNING OF SURGERY, do not take any diabetic medications  Reviewed and Endorsed by [facility] Patient Education Committee, August 2015                               You may not have any metal on your body including hair pins, jewelry, and body piercing             Do not wear make-up, lotions, powders, perfumes, or deodorant  Do not wear  nail polish including gel and S&S, artificial/acrylic nails, or any other type of covering on natural nails including finger and toenails. If you have artificial nails, gel coating, etc. that needs to be removed by a nail salon please have this removed prior to surgery or surgery may need to be canceled/ delayed if the surgeon/ anesthesia feels like they are unable to be safely monitored.   Do not shave  48 hours prior to surgery.    Do not bring valuables to the hospital. [place].   Contacts, dentures or bridgework may not be worn into surgery.    Patients discharged on the day of surgery will not be allowed to drive home.   Please read over the following fact sheets you were given: IF YOU HAVE QUESTIONS ABOUT YOUR PRE-OP INSTRUCTIONS PLEASE CALL [place] - Preparing for Surgery Before surgery, you can play an important role.  Because skin is not sterile, your skin needs to be as free of germs as possible.  You can reduce the number of germs on your skin by washing with CHG (chlorahexidine gluconate) soap before surgery.  CHG is an antiseptic cleaner which kills germs and bonds  with the skin to continue killing germs even after washing. Please DO NOT use if you have an allergy to CHG or antibacterial soaps.  If your skin becomes reddened/irritated stop using the CHG and inform your nurse when you arrive at Short Stay. Do not shave (including legs and underarms) for at least 48 hours prior to the first CHG shower.  You may shave your face/neck.  Please follow these instructions carefully:  1.  Shower with CHG Soap the night before surgery and the  morning of surgery.  2.  If you choose to wash your hair, wash your hair first as usual with your normal  shampoo.  3.  After you shampoo, rinse your hair and body thoroughly to remove the shampoo.                             4.  Use CHG as you would any other liquid soap.   You can apply chg directly to the skin and wash.  Gently with a scrungie or clean washcloth.  5.  Apply the CHG Soap to your body ONLY FROM THE NECK DOWN.   Do   not use on face/ open                           Wound or open sores. Avoid contact with eyes, ears mouth and   genitals (private parts).                       Wash face,  Genitals (private parts) with your normal soap.             6.  Wash thoroughly, paying special attention to the area where your    surgery  will be performed.  7.  Thoroughly rinse your body with warm water from the neck down.  8.  DO NOT shower/wash with your normal soap after using and rinsing off the CHG Soap.                9.  Pat yourself dry with a clean towel.            10.  Wear clean pajamas.            11.  Place clean sheets on your bed the night of your first shower and do not  sleep with pets. Day of Surgery : Do not apply any lotions/deodorants the morning of surgery.  Please wear clean clothes to the hospital/surgery center.  FAILURE TO FOLLOW THESE INSTRUCTIONS MAY RESULT IN THE CANCELLATION OF YOUR SURGERY  PATIENT SIGNATURE_________________________________  NURSE SIGNATURE__________________________________  ________________________________________________________________________

## 2021-05-18 ENCOUNTER — Other Ambulatory Visit: Payer: Self-pay

## 2021-05-18 ENCOUNTER — Encounter (HOSPITAL_COMMUNITY)
Admission: RE | Admit: 2021-05-18 | Discharge: 2021-05-18 | Disposition: A | Discharge status: Home / Self Care | Payer: 59 | Source: Ambulatory Visit | Attending: Surgery | Admitting: Surgery

## 2021-05-18 ENCOUNTER — Encounter (HOSPITAL_COMMUNITY): Payer: Self-pay

## 2021-05-18 DIAGNOSIS — E119 Type 2 diabetes mellitus without complications: Secondary | ICD-10-CM | POA: Diagnosis not present

## 2021-05-18 DIAGNOSIS — E210 Primary hyperparathyroidism: Secondary | ICD-10-CM | POA: Diagnosis not present

## 2021-05-18 DIAGNOSIS — Z79899 Other long term (current) drug therapy: Secondary | ICD-10-CM | POA: Insufficient documentation

## 2021-05-18 DIAGNOSIS — Z01818 Encounter for other preprocedural examination: Secondary | ICD-10-CM | POA: Diagnosis present

## 2021-05-18 DIAGNOSIS — Z7984 Long term (current) use of oral hypoglycemic drugs: Secondary | ICD-10-CM | POA: Diagnosis not present

## 2021-05-18 LAB — CBC
HCT: 45 % (ref 36.0–46.0)
Hemoglobin: 14.4 g/dL (ref 12.0–15.0)
MCH: 26.9 pg (ref 26.0–34.0)
MCHC: 32 g/dL (ref 30.0–36.0)
MCV: 84 fL (ref 80.0–100.0)
Platelets: 220 10*3/uL (ref 150–400)
RBC: 5.36 MIL/uL — ABNORMAL HIGH (ref 3.87–5.11)
RDW: 14.6 % (ref 11.5–15.5)
WBC: 6.3 10*3/uL (ref 4.0–10.5)
nRBC: 0 % (ref 0.0–0.2)

## 2021-05-18 LAB — HEMOGLOBIN A1C
Hgb A1c MFr Bld: 8.5 % — ABNORMAL HIGH (ref 4.8–5.6)
Mean Plasma Glucose: 197.25 mg/dL

## 2021-05-18 LAB — COMPREHENSIVE METABOLIC PANEL
ALT: 22 U/L (ref 0–44)
AST: 14 U/L — ABNORMAL LOW (ref 15–41)
Albumin: 4.3 g/dL (ref 3.5–5.0)
Alkaline Phosphatase: 88 U/L (ref 38–126)
Anion gap: 7 (ref 5–15)
BUN: 15 mg/dL (ref 6–20)
CO2: 27 mmol/L (ref 22–32)
Calcium: 10.8 mg/dL — ABNORMAL HIGH (ref 8.9–10.3)
Chloride: 106 mmol/L (ref 98–111)
Creatinine, Ser: 0.9 mg/dL (ref 0.44–1.00)
GFR, Estimated: >60 mL/min (ref >60)
Glucose, Bld: 150 mg/dL — ABNORMAL HIGH (ref 70–99)
Potassium: 4.6 mmol/L (ref 3.5–5.1)
Sodium: 140 mmol/L (ref 135–145)
Total Bilirubin: 0.7 mg/dL (ref 0.3–1.2)
Total Protein: 7.9 g/dL (ref 6.5–8.1)

## 2021-05-18 LAB — GLUCOSE, CAPILLARY: Glucose-Capillary: 139 mg/dL — ABNORMAL HIGH (ref 70–99)

## 2021-05-18 NOTE — Progress Notes (Signed)
A1C came back 8.5. Results routed to Dr. Harlow Asa.

## 2021-05-19 NOTE — Progress Notes (Signed)
Anesthesia Chart Review   Case: [id number] Date/Time: 05/24/21 0715   Procedure: LEFT INFERIOR PARATHYROIDECTOMY, POSSIBLE NECK EXPLORATION   Anesthesia type: General   Pre-op diagnosis: primary hyperparathyroidism   Location: [facility] ROOM 02 / [facility]   Surgeons: Armandina Gemma, MD       DISCUSSION:60 y.o. never smoker with h/o DM II, asthma, primary hyperparathyroidism scheduled for above procedure 05/24/2021 with Dr. Armandina Gemma.   Pt last seen by endocrinologist 01/01/2021. A1C 11.7 in January. Down to 7.5 on 12/15/2020.  A1C 8.5 at PAT visit, forwarded to Dr. Harlow Asa.  Evaluate CBG DOS.  VS: BP (!) 144/75   Pulse 80   Temp 36.7 C (Oral)   Resp 16   Ht 5\' 2"  (1.575 m)   Wt 117.4 kg   SpO2 98%   BMI 47.34 kg/m   PROVIDERS: Binnie Rail, MD is PCP   Shamleffer, Melanie Crazier, MD is Endocrinologist  LABS: Labs reviewed: Acceptable for surgery. (all labs ordered are listed, but only abnormal results are displayed)  Labs Reviewed  HEMOGLOBIN A1C - Abnormal; Notable for the following components:      Result Value   Hgb A1c MFr Bld 8.5 (*)    All other components within normal limits  COMPREHENSIVE METABOLIC PANEL - Abnormal; Notable for the following components:   Glucose, Bld 150 (*)    Calcium 10.8 (*)    AST 14 (*)    All other components within normal limits  CBC - Abnormal; Notable for the following components:   RBC 5.36 (*)    All other components within normal limits  GLUCOSE, CAPILLARY - Abnormal; Notable for the following components:   Glucose-Capillary 139 (*)    All other components within normal limits     IMAGES:   EKG: 05/18/21 Rate 80 bpm  NSR Rightward axis  Nonspecific T wave abnormality   CV:  Past Medical History:  Diagnosis Date   Allergy    Anemia    Arthritis    Asthma    15yrs ago   Chronic deafness of both ears    left ear has 40% hearing-right ear has 60 % hearing   Colon polyps    Complication of anesthesia    Hard to wake up    Constipation    Diabetes mellitus without complication (HCC)    Heartburn    High cholesterol    Migraines    none in last 11 yrs   Obesity    Pelvic kidney    left   Pneumonia    Post partum depression    Urinary incontinence     Past Surgical History:  Procedure Laterality Date   CESAREAN SECTION     COLONOSCOPY WITH PROPOFOL N/A 03/24/2015   Procedure: COLONOSCOPY WITH PROPOFOL;  Surgeon: Juanita Craver, MD;  Location: [facility];  Service: Endoscopy;  Laterality: N/A;   HEMORRHOID BANDING  08/2017, 09/2017   KNEE ARTHROSCOPY WITH MEDIAL MENISECTOMY Left 04/17/2014   Procedure: LEFT KNEE ARTHROSCOPY WITH PARTIAL MEDIAL MENISCECTOMY AND CHONDROPLASTY;  Surgeon: Johnn Hai, MD;  Location: [facility];  Service: Orthopedics;  Laterality: Left;   TYMPANOSTOMY TUBE PLACEMENT Right     MEDICATIONS:  cetirizine (ZYRTEC) 10 MG tablet   chlorhexidine (PERIDEX) 0.12 % solution   Cholecalciferol (VITAMIN D3) 50 MCG (2000 UT) TABS   Continuous Blood Gluc Receiver (FREESTYLE LIBRE 2 READER) DEVI   Continuous Blood Gluc Sensor (FREESTYLE LIBRE 2 SENSOR) MISC   Emollient (CERAVE DAILY MOISTURIZING EX)  empagliflozin (JARDIANCE) 25 MG TABS tablet   glipiZIDE (GLUCOTROL) 5 MG tablet   glucose blood (FREESTYLE TEST STRIPS) test strip   KOMBIGLYZE XR 12-998 MG TB24   losartan (COZAAR) 25 MG tablet   olopatadine (PATANOL) 0.1 % ophthalmic solution   rosuvastatin (CRESTOR) 5 MG tablet   triamcinolone ointment (KENALOG) 0.1 %   No current facility-administered medications for this encounter.    Konrad Felix Ward, PA-C [facility] [phone]

## 2021-05-23 ENCOUNTER — Encounter (HOSPITAL_COMMUNITY): Payer: Self-pay | Admitting: Surgery

## 2021-05-23 DIAGNOSIS — E042 Nontoxic multinodular goiter: Secondary | ICD-10-CM | POA: Diagnosis present

## 2021-05-23 NOTE — Anesthesia Preprocedure Evaluation (Addendum)
Anesthesia Evaluation  Patient identified by MRN, date of birth, ID band Patient awake    Reviewed: Allergy & Precautions, NPO status , Patient's Chart, lab work & pertinent test results  History of Anesthesia Complications (+) history of anesthetic complications  Airway Mallampati: II  TM Distance: >3 FB Neck ROM: Full    Dental  (+) Dental Advisory Given, Teeth Intact   Pulmonary asthma , sleep apnea , pneumonia,    Pulmonary exam normal breath sounds clear to auscultation       Cardiovascular hypertension, Pt. on medications Normal cardiovascular exam Rhythm:Regular Rate:Normal     Neuro/Psych  Headaches, PSYCHIATRIC DISORDERS Depression    GI/Hepatic negative GI ROS, Neg liver ROS,   Endo/Other  diabetesMorbid obesity  Renal/GU Renal disease     Musculoskeletal  (+) Arthritis ,   Abdominal   Peds  Hematology  (+) Blood dyscrasia, anemia ,   Anesthesia Other Findings   Reproductive/Obstetrics                            Anesthesia Physical Anesthesia Plan  ASA: 3  Anesthesia Plan: General   Post-op Pain Management:    Induction: Intravenous  PONV Risk Score and Plan: 4 or greater and Ondansetron, Aprepitant, Midazolam, Scopolamine patch - Pre-op, Dexamethasone, Diphenhydramine and Treatment may vary due to age or medical condition  Airway Management Planned: Oral ETT  Additional Equipment: None  Intra-op Plan:   Post-operative Plan: Extubation in OR  Informed Consent: I have reviewed the patients History and Physical, chart, labs and discussed the procedure including the risks, benefits and alternatives for the proposed anesthesia with the patient or authorized representative who has indicated his/her understanding and acceptance.     Dental advisory given  Plan Discussed with: CRNA  Anesthesia Plan Comments:        Anesthesia Quick Evaluation

## 2021-05-23 NOTE — H&P (Signed)
Chief Complaint: Follow-up (Parathyroid adenoma)  History of Present Illness:  Patient returns for follow-up having undergone extensive diagnostic testing for evaluation of suspected primary hyperparathyroidism and newly identified thyroid nodules. Patient initially underwent an ultrasound examination of the neck. She was found to have multiple thyroid nodules. There was a nodule in the mid left thyroid lobe measuring 2.9 cm in greatest dimension that met criteria for fine-needle aspiration biopsy. This was performed on April 15, 2021. This demonstrated a benign follicular nodule, Bethesda category II. The ultrasound also identified a 1.4 x 1.3 x 0.6 cm hypoechoic nodule adjacent to the posterior aspect of the inferior left thyroid lobe and was felt to possibly represent a parathyroid adenoma. Additional studies to confirm this finding included a 4D CT scan of the neck as well as a nuclear medicine parathyroid scan. Neither of these studies confirm the location of the adenoma. Patient returns today to review these results and to discuss an operative strategy for management.   Review of Systems: A complete review of systems was obtained from the patient. I have reviewed this information and discussed as appropriate with the patient. See HPI as well for other ROS.  Review of Systems  Constitutional: Positive for malaise/fatigue.  HENT: Negative.  Eyes: Negative.  Respiratory: Negative.  Cardiovascular: Negative.  Gastrointestinal: Negative.  Genitourinary: Negative.  Musculoskeletal: Positive for joint pain and myalgias.  Skin: Negative.  Neurological: Negative.  Endo/Heme/Allergies: Negative.  Psychiatric/Behavioral: Negative.    Medical History: Past Medical History:  Diagnosis Date   Arthritis   Asthma, unspecified asthma severity, unspecified whether complicated, unspecified whether persistent   Diabetes mellitus without complication (CMS-HCC)   GERD (gastroesophageal  reflux disease)   Patient Active Problem List  Diagnosis   Arthralgia   Asthma   Asymmetric SNHL (sensorineural hearing loss)   Diarrhea   COVID-19 virus infection   Drug-induced myopathy   Eustachian tube dysfunction, right   Excessive cerumen in ear canal, right   Fibroids, intramural   Hemorrhoids   Hepatic steatosis   Hyperlipidemia   Hypertension   Morbid obesity with BMI of 45.0-49.9, adult (CMS-HCC)   Motion sickness   Myringotomy tube status   Obstructive sleep apnea   Pelvic kidney   Primary hyperparathyroidism (CMS-HCC)   Pruritus   Uncontrolled diabetes mellitus   Vitamin D deficiency   Multiple thyroid nodules   History reviewed. No pertinent surgical history.   Allergies  Allergen Reactions   Linaclotide Other (See Comments)  Abdominal pain  Abdominal pain    Pravastatin Other (See Comments)  Body aches   Atorvastatin Unknown  Muscle aches, stomach upset   Ketorolac Tromethamine Unknown  Joint pain and swelling Joint pain and swelling   Latex Itching   Rosuvastatin Unknown  Stomach pain   Tramadol Other (See Comments)  JOINT PAIN JOINT PAIN   Semaglutide Nausea And Vomiting  Nausea, vomiting   Current Outpatient Medications on File Prior to Visit  Medication Sig Dispense Refill   cetirizine (ZYRTEC) 10 MG tablet   chlorhexidine (PERIDEX) 0.12 % solution   ezetimibe (ZETIA) 10 mg tablet   glipiZIDE (GLUCOTROL) 5 MG tablet TAKE 1 TABLET (5 MG TOTAL) BY MOUTH 2 (TWO) TIMES DAILY BEFORE A MEAL.   JARDIANCE 10 mg tablet Take 10 mg by mouth once daily   losartan (COZAAR) 25 MG tablet Take 25 mg by mouth once daily   rosuvastatin (CRESTOR) 5 MG tablet Take 5 mg by mouth once daily   sAXagliptin-metformin (KOMBIGLYZE XR)  5-1,000 mg ER 24 hr multiphase tablet TAKE 2 TABLETS BY MOUTH DAILY WITH SUPPER.   No current facility-administered medications on file prior to visit.   History reviewed. No pertinent family history.   Social History    Tobacco Use  Smoking Status Never Smoker  Smokeless Tobacco Never Used    Social History   Socioeconomic History   Marital status: Married  Tobacco Use   Smoking status: Never Smoker   Smokeless tobacco: Never Used  Substance and Sexual Activity   Alcohol use: Not Currently   Drug use: Defer   Sexual activity: Defer   Objective:   Vitals:   Weight: (!) 120.4 kg (265 lb 6.4 oz)  Height: 156.2 cm (5' 1.5")   Body mass index is 49.33 kg/m.  Physical Exam   Limited physical examination performed today. See prior notes.  Assessment and Plan:  Diagnoses and all orders for this visit:  Primary hyperparathyroidism (CMS-HCC)  Multiple thyroid nodules   Patient returns today to review the results of her multiple diagnostic studies. These are outlined above.  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 I have recommended proceeding with a minimally invasive approach to the left inferior parathyroid gland in hopes of finding an adenoma and performing a minimally invasive resection. This would be an outpatient surgical procedure. Today we discussed the possibility of not identifying an adenoma at that location. I would then recommend converting the procedure to a traditional neck exploration, attempting to localize all 4 parathyroid glands, and performed parathyroidectomy on any abnormal glands encountered. We discussed the more extensive nature of the surgery and the fact that it would require an overnight hospital stay. He would also likely require a longer time out of work. The patient understands the strategy and agrees with this recommendation.  The thyroid nodule in question was benign on needle biopsy. Patient will likely need a follow-up thyroid ultrasound in 1 year.  Patient would like to proceed with surgery. We discussed the risk and benefits today. We discussed the size and location of the surgical incision. We discussed  her postoperative recovery and time out of work. We discussed the potential for an overnight hospital stay. The patient understands and agrees to proceed.  Armandina Gemma, MD [facility] [place] practice Office: [phone]

## 2021-05-24 ENCOUNTER — Ambulatory Visit (HOSPITAL_COMMUNITY): Payer: 59 | Admitting: Physician Assistant

## 2021-05-24 ENCOUNTER — Encounter (HOSPITAL_COMMUNITY)
Admission: RE | Disposition: A | Discharge status: Home / Self Care | Payer: Self-pay | Source: Ambulatory Visit | Attending: Surgery

## 2021-05-24 ENCOUNTER — Ambulatory Visit (HOSPITAL_COMMUNITY)
Admission: RE | Admit: 2021-05-24 | Discharge: 2021-05-24 | Disposition: A | Discharge status: Home / Self Care | Payer: 59 | Source: Ambulatory Visit | Attending: Surgery | Admitting: Surgery

## 2021-05-24 ENCOUNTER — Ambulatory Visit (HOSPITAL_COMMUNITY): Payer: 59 | Admitting: Certified Registered Nurse Anesthetist

## 2021-05-24 ENCOUNTER — Encounter (HOSPITAL_COMMUNITY): Payer: Self-pay | Admitting: Surgery

## 2021-05-24 DIAGNOSIS — Z8639 Personal history of other endocrine, nutritional and metabolic disease: Secondary | ICD-10-CM | POA: Diagnosis present

## 2021-05-24 DIAGNOSIS — Z79899 Other long term (current) drug therapy: Secondary | ICD-10-CM | POA: Diagnosis not present

## 2021-05-24 DIAGNOSIS — E210 Primary hyperparathyroidism: Secondary | ICD-10-CM

## 2021-05-24 DIAGNOSIS — I10 Essential (primary) hypertension: Secondary | ICD-10-CM | POA: Diagnosis not present

## 2021-05-24 DIAGNOSIS — G4736 Sleep related hypoventilation in conditions classified elsewhere: Secondary | ICD-10-CM | POA: Diagnosis not present

## 2021-05-24 DIAGNOSIS — Z8616 Personal history of COVID-19: Secondary | ICD-10-CM | POA: Diagnosis not present

## 2021-05-24 DIAGNOSIS — E785 Hyperlipidemia, unspecified: Secondary | ICD-10-CM | POA: Diagnosis not present

## 2021-05-24 DIAGNOSIS — Z6841 Body Mass Index (BMI) 40.0 and over, adult: Secondary | ICD-10-CM | POA: Insufficient documentation

## 2021-05-24 DIAGNOSIS — E119 Type 2 diabetes mellitus without complications: Secondary | ICD-10-CM | POA: Diagnosis not present

## 2021-05-24 DIAGNOSIS — Z7984 Long term (current) use of oral hypoglycemic drugs: Secondary | ICD-10-CM | POA: Diagnosis not present

## 2021-05-24 DIAGNOSIS — J45909 Unspecified asthma, uncomplicated: Secondary | ICD-10-CM | POA: Insufficient documentation

## 2021-05-24 DIAGNOSIS — E042 Nontoxic multinodular goiter: Secondary | ICD-10-CM | POA: Diagnosis present

## 2021-05-24 HISTORY — PX: PARATHYROIDECTOMY: SHX19

## 2021-05-24 LAB — GLUCOSE, CAPILLARY
Glucose-Capillary: 188 mg/dL — ABNORMAL HIGH (ref 70–99)
Glucose-Capillary: 174 mg/dL — ABNORMAL HIGH (ref 70–99)

## 2021-05-24 LAB — HCG, SERUM, QUALITATIVE: Preg, Serum: NEGATIVE

## 2021-05-24 SURGERY — PARATHYROIDECTOMY
Anesthesia: General | Site: Neck | Laterality: Left

## 2021-05-24 MED ORDER — DEXAMETHASONE SODIUM PHOSPHATE 10 MG/ML IJ SOLN
INTRAMUSCULAR | Status: AC
  Filled 2021-05-24: qty 1

## 2021-05-24 MED ORDER — MIDAZOLAM HCL 5 MG/5ML IJ SOLN
INTRAMUSCULAR | Status: DC | PRN
  Administered 2021-05-24: 2 mg via INTRAVENOUS

## 2021-05-24 MED ORDER — MIDAZOLAM HCL 2 MG/2ML IJ SOLN
INTRAMUSCULAR | Status: AC
  Filled 2021-05-24: qty 2

## 2021-05-24 MED ORDER — ONDANSETRON HCL 4 MG/2ML IJ SOLN
INTRAMUSCULAR | Status: DC | PRN
  Administered 2021-05-24: 4 mg via INTRAVENOUS

## 2021-05-24 MED ORDER — DEXAMETHASONE SODIUM PHOSPHATE 4 MG/ML IJ SOLN
INTRAMUSCULAR | Status: DC | PRN
  Administered 2021-05-24: 5 mg via INTRAVENOUS

## 2021-05-24 MED ORDER — SCOPOLAMINE 1 MG/3DAYS TD PT72
1.0000 | MEDICATED_PATCH | TRANSDERMAL | Status: DC
  Administered 2021-05-24: 1.5 mg via TRANSDERMAL
  Filled 2021-05-24: qty 1

## 2021-05-24 MED ORDER — ONDANSETRON HCL 4 MG/2ML IJ SOLN
INTRAMUSCULAR | Status: AC
  Filled 2021-05-24: qty 2

## 2021-05-24 MED ORDER — FENTANYL CITRATE (PF) 100 MCG/2ML IJ SOLN
INTRAMUSCULAR | Status: AC
  Filled 2021-05-24: qty 2

## 2021-05-24 MED ORDER — FENTANYL CITRATE (PF) 100 MCG/2ML IJ SOLN
INTRAMUSCULAR | Status: DC | PRN
  Administered 2021-05-24: 100 ug via INTRAVENOUS
  Administered 2021-05-24 (×2): 50 ug via INTRAVENOUS

## 2021-05-24 MED ORDER — APREPITANT 40 MG PO CAPS
40.0000 mg | ORAL_CAPSULE | Freq: Once | ORAL | Status: AC
  Administered 2021-05-24: 40 mg via ORAL
  Filled 2021-05-24: qty 1

## 2021-05-24 MED ORDER — MEPERIDINE HCL 50 MG/ML IJ SOLN: 6.2500 mg | INTRAMUSCULAR | Status: DC | PRN

## 2021-05-24 MED ORDER — ROCURONIUM BROMIDE 10 MG/ML (PF) SYRINGE
PREFILLED_SYRINGE | INTRAVENOUS | Status: AC
  Filled 2021-05-24: qty 10

## 2021-05-24 MED ORDER — LIDOCAINE 2% (20 MG/ML) 5 ML SYRINGE
INTRAMUSCULAR | Status: DC | PRN
Start: 2021-05-24 — End: 2021-05-24
  Administered 2021-05-24: 100 mg via INTRAVENOUS

## 2021-05-24 MED ORDER — 0.9 % SODIUM CHLORIDE (POUR BTL) OPTIME
TOPICAL | Status: DC | PRN
  Administered 2021-05-24: 1000 mL

## 2021-05-24 MED ORDER — LIDOCAINE HCL (PF) 2 % IJ SOLN
INTRAMUSCULAR | Status: AC
  Filled 2021-05-24: qty 5

## 2021-05-24 MED ORDER — HYDROMORPHONE HCL 1 MG/ML IJ SOLN
INTRAMUSCULAR | Status: AC
  Administered 2021-05-24: 0.5 mg via INTRAVENOUS
  Filled 2021-05-24: qty 2

## 2021-05-24 MED ORDER — BUPIVACAINE HCL (PF) 0.25 % IJ SOLN
INTRAMUSCULAR | Status: DC | PRN
  Administered 2021-05-24: 10 mL

## 2021-05-24 MED ORDER — CHLORHEXIDINE GLUCONATE 0.12 % MT SOLN: 15.0000 mL | Freq: Once | OROMUCOSAL | Status: AC

## 2021-05-24 MED ORDER — HYDROMORPHONE HCL 1 MG/ML IJ SOLN
0.2500 mg | INTRAMUSCULAR | Status: DC | PRN
  Administered 2021-05-24: 0.5 mg via INTRAVENOUS

## 2021-05-24 MED ORDER — CEFAZOLIN IN SODIUM CHLORIDE 3-0.9 GM/100ML-% IV SOLN
3.0000 g | INTRAVENOUS | Status: AC
  Administered 2021-05-24: 3 g via INTRAVENOUS
  Filled 2021-05-24: qty 100

## 2021-05-24 MED ORDER — PROPOFOL 500 MG/50ML IV EMUL
INTRAVENOUS | Status: DC | PRN
  Administered 2021-05-24: 25 ug/kg/min via INTRAVENOUS

## 2021-05-24 MED ORDER — LACTATED RINGERS IV SOLN: INTRAVENOUS | Status: DC

## 2021-05-24 MED ORDER — PROPOFOL 500 MG/50ML IV EMUL
INTRAVENOUS | Status: AC
  Filled 2021-05-24: qty 50

## 2021-05-24 MED ORDER — HYDROCODONE-ACETAMINOPHEN 5-325 MG PO TABS: 1.0000 | ORAL_TABLET | Freq: Four times a day (QID) | ORAL | 0 refills | Status: DC | PRN

## 2021-05-24 MED ORDER — SUGAMMADEX SODIUM 200 MG/2ML IV SOLN
INTRAVENOUS | Status: DC | PRN
  Administered 2021-05-24: 300 mg via INTRAVENOUS

## 2021-05-24 MED ORDER — ORAL CARE MOUTH RINSE
15.0000 mL | Freq: Once | OROMUCOSAL | Status: AC
  Administered 2021-05-24: 15 mL via OROMUCOSAL

## 2021-05-24 MED ORDER — CHLORHEXIDINE GLUCONATE CLOTH 2 % EX PADS: 6.0000 | MEDICATED_PAD | Freq: Once | CUTANEOUS | Status: DC

## 2021-05-24 MED ORDER — ROCURONIUM BROMIDE 10 MG/ML (PF) SYRINGE
PREFILLED_SYRINGE | INTRAVENOUS | Status: DC | PRN
Start: 2021-05-24 — End: 2021-05-24
  Administered 2021-05-24: 50 mg via INTRAVENOUS

## 2021-05-24 MED ORDER — PROPOFOL 10 MG/ML IV BOLUS
INTRAVENOUS | Status: DC | PRN
  Administered 2021-05-24: 160 mg via INTRAVENOUS

## 2021-05-24 MED ORDER — HEMOSTATIC AGENTS (NO CHARGE) OPTIME
TOPICAL | Status: DC | PRN
  Administered 2021-05-24: 1 via TOPICAL

## 2021-05-24 MED ORDER — PROMETHAZINE HCL 25 MG/ML IJ SOLN: 6.2500 mg | INTRAMUSCULAR | Status: DC | PRN

## 2021-05-24 SURGICAL SUPPLY — 36 items
ADH SKN CLS APL DERMABOND .7 (GAUZE/BANDAGES/DRESSINGS) ×1
APL PRP STRL LF DISP 70% ISPRP (MISCELLANEOUS) ×1
ATTRACTOMAT 16X20 MAGNETIC DRP (DRAPES) ×1 IMPLANT
BAG COUNTER SPONGE SURGICOUNT (BAG) ×2 IMPLANT
BAG SPNG CNTER NS LX DISP (BAG) ×1
BLADE SURG 15 STRL LF DISP TIS (BLADE) ×1 IMPLANT
BLADE SURG 15 STRL SS (BLADE) ×2
CHLORAPREP W/TINT 26 (MISCELLANEOUS) ×2 IMPLANT
CLIP TI MEDIUM 6 (CLIP) ×4 IMPLANT
CLIP TI WIDE RED SMALL 6 (CLIP) ×4 IMPLANT
COVER SURGICAL LIGHT HANDLE (MISCELLANEOUS) ×2 IMPLANT
DERMABOND ADVANCED (GAUZE/BANDAGES/DRESSINGS) ×1
DERMABOND ADVANCED .7 DNX12 (GAUZE/BANDAGES/DRESSINGS) ×1 IMPLANT
DRAPE LAPAROTOMY T 98X78 PEDS (DRAPES) ×2 IMPLANT
DRAPE UTILITY XL STRL (DRAPES) ×2 IMPLANT
ELECT REM PT RETURN 15FT ADLT (MISCELLANEOUS) ×2 IMPLANT
GAUZE 4X4 16PLY ~~LOC~~+RFID DBL (SPONGE) ×2 IMPLANT
GLOVE SURG SYN 7.5  E (GLOVE) ×4
GLOVE SURG SYN 7.5 E (GLOVE) ×2 IMPLANT
GLOVE SURG SYN 7.5 PF PI (GLOVE) ×2 IMPLANT
GOWN STRL REUS W/TWL XL LVL3 (GOWN DISPOSABLE) ×6 IMPLANT
HEMOSTAT SURGICEL 2X4 FIBR (HEMOSTASIS) ×2 IMPLANT
ILLUMINATOR WAVEGUIDE N/F (MISCELLANEOUS) IMPLANT
KIT BASIN OR (CUSTOM PROCEDURE TRAY) ×2 IMPLANT
KIT TURNOVER KIT A (KITS) ×2 IMPLANT
NDL HYPO 25X1 1.5 SAFETY (NEEDLE) ×1 IMPLANT
NEEDLE HYPO 25X1 1.5 SAFETY (NEEDLE) ×2 IMPLANT
PACK BASIC VI WITH GOWN DISP (CUSTOM PROCEDURE TRAY) ×2 IMPLANT
PENCIL SMOKE EVACUATOR (MISCELLANEOUS) ×2 IMPLANT
SUT MNCRL AB 4-0 PS2 18 (SUTURE) ×2 IMPLANT
SUT VIC AB 3-0 SH 18 (SUTURE) ×3 IMPLANT
SYR BULB IRRIG 60ML STRL (SYRINGE) ×2 IMPLANT
SYR CONTROL 10ML LL (SYRINGE) ×2 IMPLANT
TOWEL OR 17X26 10 PK STRL BLUE (TOWEL DISPOSABLE) ×2 IMPLANT
TOWEL OR NON WOVEN STRL DISP B (DISPOSABLE) ×2 IMPLANT
TUBING CONNECTING 10 (TUBING) ×2 IMPLANT

## 2021-05-24 NOTE — Discharge Instructions (Addendum)
[facility] - Dr. Todd Gerkin  THYROID & PARATHYROID SURGERY:  POST-OP INSTRUCTIONS  Always review the instruction sheet provided by the hospital nurse at discharge.  A prescription for pain medication may be sent to your pharmacy at the time of discharge.  Take your pain medication as prescribed.  If narcotic pain medicine is not needed, then you may take acetaminophen (Tylenol) or ibuprofen (Advil) as needed for pain or soreness.  Take your normal home medications as prescribed unless otherwise directed.  If you need a refill on your pain medication, please contact the office during regular business hours.  Prescriptions will not be processed by the office after 5:00PM or on weekends.  Start with a light diet upon arrival home, such as soup and crackers or toast.  Be sure to drink plenty of fluids.  Resume your normal diet the day after surgery.  Most patients will experience some swelling and bruising on the chest and neck area.  Ice packs will help for the first 48 hours after arriving home.  Swelling and bruising will take several days to resolve.   It is common to experience some constipation after surgery.  Increasing fluid intake and taking a stool softener (Colace) will usually help to prevent this problem.  A mild laxative (Milk of Magnesia or Miralax) should be taken according to package directions if there has been no bowel movement after 48 hours.  Dermabond glue covers your incision. This seals the wound and you may shower at any time. The Dermabond will remain in place for about a week.  You may gradually remove the glue when it loosens around the edges.  If you need to loosen the Dermabond for removal, apply a layer of Vaseline to the wound for 15 minutes and then remove with a Kleenex. Your sutures are under the skin and will not show - they will dissolve on their own.  You may resume light daily activities beginning the day after discharge (such as self-care,  walking, climbing stairs), gradually increasing activities as tolerated. You may have sexual intercourse when it is comfortable. Refrain from any heavy lifting or straining until approved by your doctor. You may drive when you no longer are taking prescription pain medication, you can comfortably wear a seatbelt, and you can safely maneuver your car and apply the brakes.  You will see your doctor in the office for a follow-up appointment approximately three weeks after your surgery.  Make sure that you call for this appointment within a day or two after you arrive home to insure a convenient appointment time. Please have any requested laboratory tests performed a few days prior to your office visit so that the results will be available at your follow up appointment.  WHEN TO CALL THE [facility] OFFICE: -- Fever greater than 101.5 -- Inability to urinate -- Nausea and/or vomiting - persistent -- Extreme swelling or bruising -- Continued bleeding from incision -- Increased pain, redness, or drainage from the incision -- Difficulty swallowing or breathing -- Muscle cramping or spasms -- Numbness or tingling in hands or around lips  The clinic staff is available to answer your questions during regular business hours.  Please don't hesitate to call and ask to speak to one of the nurses if you have concerns.  [facility] OFFICE: [phone] (24 hours)  Please sign up for MyChart accounts. This will allow you to communicate directly with my nurse or myself without having to call the office. It will also allow you   to view your test results. You will need to enroll in MyChart for my office ([facility]) and for the hospital ([facility]).  Todd Gerkin, MD [facility] practice 

## 2021-05-24 NOTE — Op Note (Signed)
OPERATIVE REPORT - PARATHYROIDECTOMY  Preoperative diagnosis: Primary hyperparathyroidism  Postop diagnosis: Same  Procedure: Left inferior minimally invasive parathyroidectomy  Surgeon:  Armandina Gemma, MD  Assistant:  Carlena Hurl, PA-C  Anesthesia: General endotracheal  Estimated blood loss: Minimal  Preparation: ChloraPrep  Indications: Patient returns for follow-up having undergone extensive diagnostic testing for evaluation of suspected primary hyperparathyroidism and newly identified thyroid nodules. Patient initially underwent an ultrasound examination of the neck. She was found to have multiple thyroid nodules. There was a nodule in the mid left thyroid lobe measuring 2.9 cm in greatest dimension that met criteria for fine-needle aspiration biopsy. This was performed on April 15, 2021. This demonstrated a benign follicular nodule, Bethesda category II. The ultrasound also identified a 1.4 x 1.3 x 0.6 cm hypoechoic nodule adjacent to the posterior aspect of the inferior left thyroid lobe and was felt to possibly represent a parathyroid adenoma. Additional studies to confirm this finding included a 4D CT scan of the neck as well as a nuclear medicine parathyroid scan. Neither of these studies confirm the location of the adenoma. Patient returns today for neck exploration and parathyroidectomy.  Procedure: The patient was prepared in the pre-operative holding area. The patient was brought to the operating room and placed in a supine position on the operating room table. Following administration of general anesthesia, the patient was positioned and then prepped and draped in the usual strict aseptic fashion. After ascertaining that an adequate level of anesthesia been achieved, a neck incision was made with a #15 blade. Dissection was carried through subcutaneous tissues and platysma. Hemostasis was obtained with the electrocautery. Skin flaps were developed circumferentially and a Weitlander  retractor was placed for exposure.  Strap muscles were incised in the midline. Strap muscles were reflected laterally exposing the thyroid lobe. With gentle blunt dissection the thyroid lobe was mobilized.  Dissection was carried posteriorly and an enlarged parathyroid gland was identified. It was gently mobilized. Vascular structures were divided between small ligaclips. Care was taken to avoid the recurrent laryngeal nerve. The parathyroid gland was completely excised. It was submitted to pathology where frozen section confirmed hypercellular parathyroid tissue consistent with adenoma.  Neck was irrigated with warm saline and good hemostasis was noted. Fibrillar was placed in the operative field. Strap muscles were approximated in the midline with interrupted 3-0 Vicryl sutures. Platysma was closed with interrupted 3-0 Vicryl sutures. Marcaine was infiltrated circumferentially. Skin was closed with a running 4-0 Monocryl subcuticular suture. Wound was washed and dried and Dermabond was applied. Patient was awakened from anesthesia and brought to the recovery room. The patient tolerated the procedure well.   Armandina Gemma, MD [facility] Office: [phone]

## 2021-05-24 NOTE — Anesthesia Postprocedure Evaluation (Signed)
Anesthesia Post Note  Patient: Jill [name]  Procedure(s) Performed: LEFT INFERIOR PARATHYROIDECTOMY, POSSIBLE NECK EXPLORATION (Left: Neck)     Patient location during evaluation: PACU Anesthesia Type: General Level of consciousness: sedated and patient cooperative Pain management: pain level controlled Vital Signs Assessment: post-procedure vital signs reviewed and stable Respiratory status: spontaneous breathing Cardiovascular status: stable Anesthetic complications: no   No notable events documented.  Last Vitals:  Vitals:   05/24/21 0915 05/24/21 0931  BP: 138/75 (!) 142/80  Pulse: 72 65  Resp: (!) 26 (!) 24  Temp:  36.7 C  SpO2: 93% 93%    Last Pain:  Vitals:   05/24/21 0931  TempSrc: Oral  PainSc: [place]

## 2021-05-24 NOTE — Interval H&P Note (Signed)
History and Physical Interval Note:  05/24/2021 7:01 AM  Jill [name]  has presented today for surgery, with the diagnosis of primary hyperparathyroidism.  The various methods of treatment have been discussed with the patient and family. After consideration of risks, benefits and other options for treatment, the patient has consented to    Procedure(s): LEFT INFERIOR PARATHYROIDECTOMY, POSSIBLE NECK EXPLORATION (N/A) as a surgical intervention.    The patient's history has been reviewed, patient examined, no change in status, stable for surgery.  I have reviewed the patient's chart and labs.  Questions were answered to the patient's satisfaction.    Armandina Gemma, [facility] [place] [facility] Office: [place]

## 2021-05-24 NOTE — Anesthesia Procedure Notes (Signed)
Procedure Name: Intubation Date/Time: 05/24/2021 7:30 AM Performed by: Claudia Desanctis, CRNA Pre-anesthesia Checklist: Patient identified, Emergency Drugs available, Suction available and Patient being monitored Patient Re-evaluated:Patient Re-evaluated prior to induction Oxygen Delivery Method: Circle system utilized Preoxygenation: Pre-oxygenation with 100% oxygen Induction Type: IV induction Ventilation: Mask ventilation with difficulty, Oral airway inserted - appropriate to patient size and Two handed mask ventilation required Laryngoscope Size: 2 and Miller Grade View: Grade II Tube type: Oral Tube size: 7.0 mm Number of attempts: 1 Airway Equipment and Method: Stylet Placement Confirmation: ETT inserted through vocal cords under direct vision, positive ETCO2 and breath sounds checked- equal and bilateral Secured at: 22 cm Tube secured with: Tape Dental Injury: Teeth and Oropharynx as per pre-operative assessment

## 2021-05-24 NOTE — Transfer of Care (Signed)
Immediate Anesthesia Transfer of Care Note  Patient: Jill [name]  Procedure(s) Performed: LEFT INFERIOR PARATHYROIDECTOMY, POSSIBLE NECK EXPLORATION (Left: Neck)  Patient Location: PACU  Anesthesia Type:General  Level of Consciousness: awake and patient cooperative  Airway & Oxygen Therapy: Patient Spontanous Breathing and Patient connected to face mask  Post-op Assessment: Report given to RN and Post -op Vital signs reviewed and stable  Post vital signs: Reviewed and stable  Last Vitals:  Vitals Value Taken Time  BP 140/83 05/24/21 0836  Temp    Pulse 80 05/24/21 0838  Resp    SpO2 99 % 05/24/21 0838  Vitals shown include unvalidated device data.  Last Pain:  Vitals:   05/24/21 0534  TempSrc: Oral         Complications: No notable events documented.

## 2021-05-25 ENCOUNTER — Encounter (HOSPITAL_COMMUNITY): Payer: Self-pay | Admitting: Surgery

## 2021-05-25 LAB — SURGICAL PATHOLOGY

## 2021-06-01 ENCOUNTER — Ambulatory Visit (HOSPITAL_BASED_OUTPATIENT_CLINIC_OR_DEPARTMENT_OTHER): Payer: 59 | Admitting: Obstetrics & Gynecology

## 2021-06-02 ENCOUNTER — Ambulatory Visit (HOSPITAL_BASED_OUTPATIENT_CLINIC_OR_DEPARTMENT_OTHER): Payer: 59 | Admitting: Obstetrics & Gynecology

## 2021-06-20 ENCOUNTER — Other Ambulatory Visit: Payer: Self-pay | Admitting: Internal Medicine

## 2021-07-07 ENCOUNTER — Ambulatory Visit (HOSPITAL_BASED_OUTPATIENT_CLINIC_OR_DEPARTMENT_OTHER): Payer: 59 | Admitting: Obstetrics & Gynecology

## 2021-07-08 ENCOUNTER — Encounter: Payer: Self-pay | Admitting: Internal Medicine

## 2021-07-08 ENCOUNTER — Ambulatory Visit (INDEPENDENT_AMBULATORY_CARE_PROVIDER_SITE_OTHER): Payer: 59 | Admitting: Internal Medicine

## 2021-07-08 ENCOUNTER — Other Ambulatory Visit: Payer: Self-pay

## 2021-07-08 VITALS — BP 140/82 | HR 92 | Ht 62.0 in | Wt 262.0 lb

## 2021-07-08 DIAGNOSIS — Z794 Long term (current) use of insulin: Secondary | ICD-10-CM | POA: Diagnosis not present

## 2021-07-08 DIAGNOSIS — E785 Hyperlipidemia, unspecified: Secondary | ICD-10-CM | POA: Diagnosis not present

## 2021-07-08 DIAGNOSIS — E1165 Type 2 diabetes mellitus with hyperglycemia: Secondary | ICD-10-CM

## 2021-07-08 DIAGNOSIS — E892 Postprocedural hypoparathyroidism: Secondary | ICD-10-CM | POA: Diagnosis not present

## 2021-07-08 LAB — ALBUMIN: Albumin: 4.2 g/dL (ref 3.5–5.2)

## 2021-07-08 LAB — BASIC METABOLIC PANEL
BUN: 13 mg/dL (ref 6–23)
CO2: 29 mEq/L (ref 19–32)
Calcium: 9.3 mg/dL (ref 8.4–10.5)
Chloride: 105 mEq/L (ref 96–112)
Creatinine, Ser: 1.1 mg/dL (ref 0.40–1.20)
GFR: 55.09 mL/min — ABNORMAL LOW (ref >60.00)
Glucose, Bld: 171 mg/dL — ABNORMAL HIGH (ref 70–99)
Potassium: 4.6 mEq/L (ref 3.5–5.1)
Sodium: 141 mEq/L (ref 135–145)

## 2021-07-08 LAB — LIPID PANEL
Cholesterol: 151 mg/dL (ref 0–200)
HDL: 43.8 mg/dL (ref >39.00)
LDL Cholesterol: 95 mg/dL (ref 0–99)
NonHDL: 107.5
Total CHOL/HDL Ratio: 3
Triglycerides: 62 mg/dL (ref 0.0–149.0)
VLDL: 12.4 mg/dL (ref 0.0–40.0)

## 2021-07-08 LAB — VITAMIN D 25 HYDROXY (VIT D DEFICIENCY, FRACTURES): VITD: 34.22 ng/mL (ref 30.00–100.00)

## 2021-07-08 MED ORDER — GLIPIZIDE 5 MG PO TABS: 2.5000 mg | ORAL_TABLET | Freq: Two times a day (BID) | ORAL | 3 refills | Status: DC

## 2021-07-08 NOTE — Patient Instructions (Signed)
-   Glipizide 5 mg, Half a tablet  before Breakfast and  Supper  - Jardiance 25  Mg, 1 tablet before Breakfast  - Kombiglyze 1 tablet twice a day     HOW TO TREAT LOW BLOOD SUGARS (Blood sugar LESS THAN 70 MG/DL) Please follow the RULE OF 15 for the treatment of hypoglycemia treatment (when your (blood sugars are less than 70 mg/dL)   STEP 1: Take 15 grams of carbohydrates when your blood sugar is low, which includes:  3-4 GLUCOSE TABS  OR 3-4 OZ OF JUICE OR REGULAR SODA OR ONE TUBE OF GLUCOSE GEL    STEP 2: RECHECK blood sugar in 15 MINUTES STEP 3: If your blood sugar is still low at the 15 minute recheck --> then, go back to STEP 1 and treat AGAIN with another 15 grams of carbohydrates.

## 2021-07-08 NOTE — Progress Notes (Signed)
Name: Jill [name]  Age/ Sex: 60 y.o., female   MRN/ DOB: [id number], 03/22/1961     PCP: Binnie Rail, MD   Reason for Endocrinology Evaluation: Type 2 Diabetes Mellitus/ Hypercalcemia   Initial Endocrine Consultative Visit: 06/25/2020    PATIENT IDENTIFIER: Jill [name] is a 60 y.o. female with a past medical history of T2DM, HTN, Ashtma and Primary Hyperparathyroidism ( S/P parathyroidectomy 2022). The patient has followed with Endocrinology clinic since 06/25/2020 for consultative assistance with management of her diabetes.  DIABETIC HISTORY:  Jill [name] was diagnosed with DM > 30 yrs ago. Ozempic caused GI side effects. No reported intolerance to Trajenta .She was started on Glipizide and Jardiance in 08/2020 Her hemoglobin A1c has ranged from  6.7% in 2019 to 11.7% in 2021   Glipizide was stopped by PCP , we restarted at smaller dose in 12/2020  Hyperparathyroid History  The patient was first diagnosed with hypercalcemia in 2015, with maximum serum calcium level of  level 11.2 mg/dL ( Non corrected)    Her 24- hr urinary Ca/Cr ratio of 0.012 with a 191 mg of 24-hr calcium (06/2020) DXA (10/06/2020) low bone density    She met surgical criteria  due to low GFR  She is S/P left inferior parathyroidectomy 05/24/2021  SUBJECTIVE:   During the last visit (01/01/2021): A1c 7.5% , we restarted Glipizide at a smaller dose, continue Jardiance and Kombiglyze   Today (07/08/2021): Jill [name]  She checks her blood sugars multiple  times daily, through the CGM. The patient has not had hypoglycemic episodes since the last clinic visit. She admits to dietary indiscretions  She has improved energy since her parathyroid surgery    HOME DIABETES REGIMEN:  - Glipizide 5 mg, Half a tablet  before Supper  - Jardiance 25  Mg, 1 tablet before Breakfast  - Kombiglyze 12-998 mg 2 tablet before Supper     Statin: yes ACE-I/ARB: yes    CONTINUOUS GLUCOSE MONITORING RECORD  INTERPRETATION    Dates of Recording: 11/18-12/08/2020  Sensor description:freestyle libre  Results statistics:   CGM use % of time 83  Average and SD 196/25.9  Time in range 44    %  % Time Above 180 41  % Time above 250 15  % Time Below target 0   Glycemic patterns summary: BG's are optimal overnight with hyperglycemia noted as the day goes on worsening by supper time   Hyperglycemic episodes  post prandial  Hypoglycemic episodes occurred N/A  Overnight periods: trends down       DIABETIC COMPLICATIONS: Microvascular complications:  CKD III Denies: Neuropathy, retinopathy Last Eye Exam: Completed 03/2021  Macrovascular complications:   Denies: CAD, CVA, PVD   HISTORY:  Past Medical History:  Past Medical History:  Diagnosis Date   Allergy    Anemia    Arthritis    Asthma    66yr ago   Chronic deafness of both ears    left ear has 40% hearing-right ear has 60 % hearing   Colon polyps    Complication of anesthesia    Hard to wake up   Constipation    Diabetes mellitus without complication (HCC)    Heartburn    High cholesterol    Migraines    none in last 11 yrs   Obesity    Pelvic kidney    left   Pneumonia    Post partum depression    Urinary incontinence    Past Surgical History:  Past Surgical History:  Procedure Laterality Date   CESAREAN SECTION     COLONOSCOPY WITH PROPOFOL N/A 03/24/2015   Procedure: COLONOSCOPY WITH PROPOFOL;  Surgeon: Juanita Craver, MD;  Location: [facility];  Service: Endoscopy;  Laterality: N/A;   HEMORRHOID BANDING  08/2017, 09/2017   KNEE ARTHROSCOPY WITH MEDIAL MENISECTOMY Left 04/17/2014   Procedure: LEFT KNEE ARTHROSCOPY WITH PARTIAL MEDIAL MENISCECTOMY AND CHONDROPLASTY;  Surgeon: Johnn Hai, MD;  Location: [facility];  Service: Orthopedics;  Laterality: Left;   PARATHYROIDECTOMY Left 05/24/2021   Procedure: LEFT INFERIOR PARATHYROIDECTOMY, POSSIBLE NECK EXPLORATION;  Surgeon: Armandina Gemma, MD;  Location: [facility];  Service: General;  Laterality: Left;   TYMPANOSTOMY TUBE PLACEMENT Right    Social History:  reports that she has never smoked. She has never used smokeless tobacco. She reports that she does not currently use alcohol. She reports that she does not use drugs. Family History:  Family History  Problem Relation Age of Onset   Hypertension Mother    Arthritis Mother    Hypercalcemia Mother    Obesity Mother    Allergic rhinitis Mother    Asthma Mother    Hypertension Sister    Deep vein thrombosis Sister    Pulmonary embolism Sister    Asthma Daughter    Diabetes Maternal Grandmother    Heart failure Maternal Grandmother    Heart disease Maternal Grandmother    Hyperlipidemia Maternal Grandmother    Hypertension Maternal Grandmother    Hypertension Maternal Grandfather    Hyperlipidemia Maternal Grandfather    Breast cancer Paternal Aunt        diagnosed in her 102's   Breast cancer Paternal Grandmother      HOME MEDICATIONS: Allergies as of 07/08/2021       Reactions   Linzess [linaclotide] Other (See Comments)   Abdominal pain    Pravastatin Other (See Comments)   Body aches   Adhesive [tape]    Pulls skin off   Atorvastatin    Muscle aches, stomach upset   Crestor [rosuvastatin]    Stomach pain   Latex Itching   Ozempic (0.25 Or 0.5 Mg-dose) [semaglutide(0.25 Or 0.36m-dos)] Nausea And Vomiting   Stomach pain   Toradol [ketorolac Tromethamine]    Joint pain and swelling   Tramadol Other (See Comments)   JOINT PAIN        Medication List        Accurate as of July 08, 2021  7:39 AM. If you have any questions, ask your nurse or doctor.          CERAVE DAILY MOISTURIZING EX Apply 1 application topically in the morning and at bedtime. Mixed with triamcinolone cream   cetirizine 10 MG tablet Commonly known as: ZYRTEC Take 1 tablet (10 mg total) by mouth daily.   chlorhexidine 0.12 % solution Commonly known as: PERIDEX Use as directed 15 mLs in  the mouth or throat in the morning and at bedtime.   FreeStyle Libre 2 Reader Devi UAD to check sugars.  Dx NIDDM, E11.65   FreeStyle Libre 2 Sensor Misc USE AS DIRECTED TO CHECK SUGARS DX E11.65, NIDDM   FREESTYLE TEST STRIPS test strip Generic drug: glucose blood Use as instructed   glipiZIDE 5 MG tablet Commonly known as: GLUCOTROL TAKE 1 TABLET (5 MG TOTAL) BY MOUTH 2 (TWO) TIMES DAILY BEFORE A MEAL. What changed:  how much to take when to take this   HYDROcodone-acetaminophen 5-325 MG tablet Commonly known as: NORCO/VICODIN Take  1-2 tablets by mouth every 6 (six) hours as needed for moderate pain.   Jardiance 25 MG Tabs tablet Generic drug: empagliflozin TAKE 1 TABLET BY MOUTH DAILY BEFORE BREAKFAST.   Kombiglyze XR 12-998 MG Tb24 Generic drug: Saxagliptin-Metformin TAKE 2 TABLETS BY MOUTH DAILY WITH SUPPER.   losartan 25 MG tablet Commonly known as: COZAAR TAKE 1 TABLET BY MOUTH EVERY DAY What changed: when to take this   olopatadine 0.1 % ophthalmic solution Commonly known as: PATANOL Place 1 drop into both eyes 2 (two) times daily as needed (eye itching or irritated).   rosuvastatin 5 MG tablet Commonly known as: Crestor Take 1 tablet (5 mg total) by mouth as directed. What changed: when to take this   triamcinolone ointment 0.1 % Commonly known as: KENALOG Apply 1 application topically 2 (two) times daily. What changed:  when to take this additional instructions   Vitamin D3 50 MCG (2000 UT) Tabs Take 2,000 Units by mouth in the morning.         OBJECTIVE:   Vital Signs: BP 140/82 (BP Location: Left Arm, Patient Position: Sitting, Cuff Size: Large)   Pulse 92   Ht 5' 2"  (1.575 m)   Wt 262 lb (118.8 kg)   SpO2 99%   BMI 47.92 kg/m   Wt Readings from Last 3 Encounters:  07/08/21 262 lb (118.8 kg)  05/18/21 258 lb 12.8 oz (117.4 kg)  01/26/21 267 lb 9.6 oz (121.4 kg)     Exam: General: Jill [name] appears well and is in NAD  Neck: General:  Supple without adenopathy. Thyroid: Thyroid size normal.  No goiter or nodules appreciated.   Lungs: Clear with good BS bilat with no rales, rhonchi, or wheezes  Heart: RRR with normal S1 and S2 and no gallops; no murmurs; no rub  Extremities: No pretibial edema. No tremor. Normal strength and motion throughout. See detailed diabetic foot exam below.  Neuro: MS is good with appropriate affect, Jill [name] is alert and Ox3        DATA REVIEWED:  Lab Results  Component Value Date   HGBA1C 8.5 (H) 05/18/2021   HGBA1C 7.5 (H) 12/15/2020   HGBA1C 11.7 (H) 08/18/2020    Latest Reference Range & Units 07/08/21 08:13  Sodium 135 - 145 mEq/L 141  Potassium 3.5 - 5.1 mEq/L 4.6  Chloride 96 - 112 mEq/L 105  CO2 19 - 32 mEq/L 29  Glucose 70 - 99 mg/dL 171 (H)  BUN 6 - 23 mg/dL 13  Creatinine 0.40 - 1.20 mg/dL 1.10  Calcium 8.4 - 10.5 mg/dL 9.3  Albumin 3.5 - 5.2 g/dL 4.2  GFR >60.00 mL/min 55.09 (L)  Total CHOL/HDL Ratio  3  Cholesterol 0 - 200 mg/dL 151  HDL Cholesterol >39.00 mg/dL 43.80  LDL (calc) 0 - 99 mg/dL 95  NonHDL  107.50  Triglycerides 0.0 - 149.0 mg/dL 62.0  VLDL 0.0 - 40.0 mg/dL 12.4  VITD 30.00 - 100.00 ng/mL 34.22    Latest Reference Range & Units 07/08/21 08:13  PTH, Intact 16 - 77 pg/mL 59     ASSESSMENT / PLAN / RECOMMENDATIONS:   1) Type 2 Diabetes Mellitus, Poorly controlled, With CKD III complications - Most recent A1c of 8.5 %. Goal A1c < 7.0 %.     - Poorly controlled diabetes due to dietary indiscretions  - Will add another Glipizide before breakfast , encouraged low carb diet,  and avoiding sugar-sweetened beverages   MEDICATIONS: Glipizide 5 mg, Half a tablet  before  Breakfast and  Supper  - Jardiance 25  Mg, 1 tablet before Breakfast  - Kombiglyze 1 tablet twice a day   EDUCATION / INSTRUCTIONS: BG monitoring instructions: Patient is instructed to check her blood sugars 3  times a day, before meals . Call [facility] [facility] if: BG  persistently < 70 I reviewed the Rule of 15 for the treatment of hypoglycemia in detail with the patient. Literature supplied.  2) Diabetic complications:  Eye: Does not have known diabetic retinopathy.  Neuro/ Feet: Does not have known diabetic peripheral neuropathy .  Renal: Patient does  have known baseline CKD. She   is not on an ACEI/ARB at present.      3) Dyslipidemia:   -She has reported intolerance to daily rosuvastatin and atorvastatin, she was started on Zetia through her PCP but this has been cost prohibitive and she was unable to pay $40 as she is already paying for the freestyle libre sensors.  - We stopped Pravastatin and started weekly Rosuvastatin 12/2020 - Tolerating it well   Continue Rosuvastatin 5 mg weekly    4) Primary Hyperparathyroidism, S/P parathyroidectomy :   - Resolved  - PTH and calcium are normal    F/U in 4 months     Signed electronically by: Mack Guise, MD  [facility]  [facility] [address] [place], [place] [address] Phone: [phone] FAX: [phone]   CC: Binnie Rail, MD [place] [address] Phone: [phone]  Fax: [phone]  Return to Endocrinology clinic as below: Future Appointments  Date Time Provider [place]  08/30/2021  2:45 PM Megan Salon, MD [facility]  01/27/2022  3:45 PM Ernst Bowler Gwenith Daily, MD [facility] None

## 2021-07-09 LAB — PARATHYROID HORMONE, INTACT (NO CA): PTH: 59 pg/mL (ref 16–77)

## 2021-07-14 ENCOUNTER — Other Ambulatory Visit: Payer: Self-pay | Admitting: Internal Medicine

## 2021-08-21 ENCOUNTER — Other Ambulatory Visit: Payer: Self-pay | Admitting: Internal Medicine

## 2021-08-26 ENCOUNTER — Other Ambulatory Visit: Payer: Self-pay | Admitting: Allergy & Immunology

## 2021-08-26 NOTE — Telephone Encounter (Signed)
Selinda Eon- Dr. Ernst Bowler has prescribed in the past. Refilled Patanol. Pt last seen 01/2021.

## 2021-08-26 NOTE — Telephone Encounter (Signed)
Left message for patient to call office. I told patient that I don't see where we ever prescribed the patanol 0.1%. will reach out to Dr. Ernst Bowler to see if he will ok a rx for the patanol 0.1%. told patient if she needs eye drops she can get over the counter xtra strength pataday or the zaditor eye drops.

## 2021-08-30 ENCOUNTER — Ambulatory Visit (INDEPENDENT_AMBULATORY_CARE_PROVIDER_SITE_OTHER): Payer: 59 | Admitting: Obstetrics & Gynecology

## 2021-08-30 ENCOUNTER — Encounter (HOSPITAL_BASED_OUTPATIENT_CLINIC_OR_DEPARTMENT_OTHER): Payer: Self-pay | Admitting: Radiology

## 2021-08-30 ENCOUNTER — Other Ambulatory Visit: Payer: Self-pay

## 2021-08-30 ENCOUNTER — Other Ambulatory Visit (HOSPITAL_COMMUNITY)
Admission: RE | Admit: 2021-08-30 | Discharge: 2021-08-30 | Disposition: A | Discharge status: Home / Self Care | Payer: 59 | Source: Ambulatory Visit | Attending: Obstetrics & Gynecology | Admitting: Obstetrics & Gynecology

## 2021-08-30 ENCOUNTER — Encounter (HOSPITAL_BASED_OUTPATIENT_CLINIC_OR_DEPARTMENT_OTHER): Payer: Self-pay | Admitting: Obstetrics & Gynecology

## 2021-08-30 ENCOUNTER — Ambulatory Visit (HOSPITAL_BASED_OUTPATIENT_CLINIC_OR_DEPARTMENT_OTHER)
Admission: RE | Admit: 2021-08-30 | Discharge: 2021-08-30 | Disposition: A | Discharge status: Home / Self Care | Payer: 59 | Source: Ambulatory Visit | Attending: Obstetrics & Gynecology | Admitting: Obstetrics & Gynecology

## 2021-08-30 VITALS — BP 131/64 | HR 83 | Ht 62.0 in | Wt 260.6 lb

## 2021-08-30 DIAGNOSIS — Z01419 Encounter for gynecological examination (general) (routine) without abnormal findings: Secondary | ICD-10-CM | POA: Diagnosis not present

## 2021-08-30 DIAGNOSIS — D251 Intramural leiomyoma of uterus: Secondary | ICD-10-CM | POA: Diagnosis not present

## 2021-08-30 DIAGNOSIS — Z124 Encounter for screening for malignant neoplasm of cervix: Secondary | ICD-10-CM | POA: Insufficient documentation

## 2021-08-30 DIAGNOSIS — Z1231 Encounter for screening mammogram for malignant neoplasm of breast: Secondary | ICD-10-CM

## 2021-08-30 DIAGNOSIS — Q632 Ectopic kidney: Secondary | ICD-10-CM

## 2021-08-30 DIAGNOSIS — B3731 Acute candidiasis of vulva and vagina: Secondary | ICD-10-CM

## 2021-08-30 MED ORDER — FLUCONAZOLE 150 MG PO TABS: ORAL_TABLET | ORAL | 0 refills | Status: DC

## 2021-08-30 NOTE — Progress Notes (Signed)
61 y.o. G1P1001 Single Black or [place] American female here for annual exam.  Doing well.  Denies vaginal bleeding.  Has Mirena IUD 05/2016.  New 8 year indication discussed.  Comfortable keeping IUD.  Had parathyroidectomy this past year.  Reports she is feeling much better.      No LMP recorded. (Menstrual status: IUD).          Sexually active: No.  The current method of family planning is IUD.    Exercising: No.   Smoker:  no  Health Maintenance: Pap:  10/02/2017 Negative History of abnormal Pap:  remote hx, normal follow up MMG:  03/21/2019 Negative Colonoscopy:  03/24/2015, had tiny hyperplastic polyp BMD:   10/06/2020 Screening Labs: done with PCP and endocrinologist   reports that she has never smoked. She has never used smokeless tobacco. She reports that she does not currently use alcohol. She reports that she does not use drugs.  Past Medical History:  Diagnosis Date   Allergy    Anemia    Arthritis    Asthma    22yrs ago   Chronic deafness of both ears    left ear has 40% hearing-right ear has 60 % hearing   Colon polyps    Complication of anesthesia    Hard to wake up   Constipation    Diabetes mellitus without complication (HCC)    Heartburn    High cholesterol    Migraines    none in last 11 yrs   Obesity    Pelvic kidney    left   Pneumonia    Post partum depression    Urinary incontinence     Past Surgical History:  Procedure Laterality Date   CESAREAN SECTION     COLONOSCOPY WITH PROPOFOL N/A 03/24/2015   Procedure: COLONOSCOPY WITH PROPOFOL;  Surgeon: Juanita Craver, MD;  Location: [facility];  Service: Endoscopy;  Laterality: N/A;   HEMORRHOID BANDING  08/2017, 09/2017   KNEE ARTHROSCOPY WITH MEDIAL MENISECTOMY Left 04/17/2014   Procedure: LEFT KNEE ARTHROSCOPY WITH PARTIAL MEDIAL MENISCECTOMY AND CHONDROPLASTY;  Surgeon: Johnn Hai, MD;  Location: [facility];  Service: Orthopedics;  Laterality: Left;   PARATHYROIDECTOMY Left 05/24/2021    Procedure: LEFT INFERIOR PARATHYROIDECTOMY, POSSIBLE NECK EXPLORATION;  Surgeon: Armandina Gemma, MD;  Location: [facility];  Service: General;  Laterality: Left;   TYMPANOSTOMY TUBE PLACEMENT Right     Current Outpatient Medications  Medication Sig Dispense Refill   cetirizine (ZYRTEC) 10 MG tablet Take 1 tablet (10 mg total) by mouth daily. 90 tablet 3   chlorhexidine (PERIDEX) 0.12 % solution Use as directed 15 mLs in the mouth or throat in the morning and at bedtime.     Cholecalciferol (VITAMIN D3) 50 MCG (2000 UT) TABS Take 2,000 Units by mouth in the morning.     Continuous Blood Gluc Receiver (FREESTYLE LIBRE 2 READER) DEVI UAD to check sugars.  Dx NIDDM, E11.65 1 each 0   Continuous Blood Gluc Sensor (FREESTYLE LIBRE 2 SENSOR) MISC USE AS DIRECTED TO CHECK SUGARS DX E11.65, NIDDM 6 each 1   Emollient (CERAVE DAILY MOISTURIZING EX) Apply 1 application topically in the morning and at bedtime. Mixed with triamcinolone cream     glipiZIDE (GLUCOTROL) 5 MG tablet Take 0.5 tablets (2.5 mg total) by mouth 2 (two) times daily before a meal. 90 tablet 3   glucose blood (FREESTYLE TEST STRIPS) test strip Use as instructed 100 each 12   JARDIANCE 25 MG TABS tablet TAKE 1  TABLET BY MOUTH DAILY BEFORE BREAKFAST. 31 tablet 5   KOMBIGLYZE XR 12-998 MG TB24 TAKE 2 TABLETS BY MOUTH DAILY WITH SUPPER. 60 tablet 5   losartan (COZAAR) 25 MG tablet TAKE 1 TABLET BY MOUTH EVERY DAY 30 tablet 5   olopatadine (PATANOL) 0.1 % ophthalmic solution INSTILL 1 DROP INTO BOTH EYES TWICE A DAY 5 mL 5   rosuvastatin (CRESTOR) 5 MG tablet Take 1 tablet (5 mg total) by mouth as directed. (Patient taking differently: Take 5 mg by mouth every Sunday.) 13 tablet 3   triamcinolone ointment (KENALOG) 0.1 % Apply 1 application topically 2 (two) times daily. (Patient taking differently: Apply 1 application topically in the morning and at bedtime. Mix with cerave moisturizing cream) 454 g 3   HYDROcodone-acetaminophen (NORCO/VICODIN)  5-325 MG tablet Take 1-2 tablets by mouth every 6 (six) hours as needed for moderate pain. (Patient not taking: Reported on 08/30/2021) 15 tablet 0   No current facility-administered medications for this visit.    Family History  Problem Relation Age of Onset   Hypertension Mother    Arthritis Mother    Hypercalcemia Mother    Obesity Mother    Allergic rhinitis Mother    Asthma Mother    Hypertension Sister    Deep vein thrombosis Sister    Pulmonary embolism Sister    Asthma Daughter    Diabetes Maternal Grandmother    Heart failure Maternal Grandmother    Heart disease Maternal Grandmother    Hyperlipidemia Maternal Grandmother    Hypertension Maternal Grandmother    Hypertension Maternal Grandfather    Hyperlipidemia Maternal Grandfather    Breast cancer Paternal Aunt        diagnosed in her 61's   Breast cancer Paternal Grandmother     Review of Systems  All other systems reviewed and are negative.  Exam:   BP 131/64 (BP Location: Right Arm, Patient Position: Sitting, Cuff Size: Large)    Pulse 83    Ht 5\' 2"  (1.575 m) Comment: reported   Wt 260 lb 9.6 oz (118.2 kg)    BMI 47.66 kg/m   Height: 5\' 2"  (157.5 cm) (reported)  General appearance: alert, cooperative and appears stated age Head: Normocephalic, without obvious abnormality, atraumatic Neck: no adenopathy, supple, symmetrical, trachea midline and thyroid normal to inspection and palpation Lungs: clear to auscultation bilaterally Breasts: normal appearance, no masses or tenderness Heart: regular rate and rhythm Abdomen: soft, non-tender; bowel sounds normal; no masses,  no organomegaly Extremities: extremities normal, atraumatic, no cyanosis or edema Skin: Skin color, texture, turgor normal. No rashes or lesions Lymph nodes: Cervical, supraclavicular, and axillary nodes normal. No abnormal inguinal nodes palpated Neurologic: Grossly normal   Pelvic: External genitalia:  no lesions              Urethra:   normal appearing urethra with no masses, tenderness or lesions              Bartholins and Skenes: normal                 Vagina: normal appearing vagina with normal color and whitish and clumpy dicharge noted on physical exam no lesions              Cervix: no lesions              Pap taken: Yes.   Bimanual Exam:  Uterus:  normal size, contour, position, consistency, mobility, non-tender  Adnexa: normal adnexa and no mass, fullness, tenderness               Rectovaginal: Confirms               Anus:  normal sphincter tone, no lesions  Chaperone, Octaviano Batty, CMA, was present for exam.  Assessment/Plan: 1. Well woman exam with routine gynecological exam - pap smear and HR HPV obtained today - MMG order placed - colonoscopy 8/16 - plan BMD after turns 61 - lab work done with Dr. Quay Burow  2. Pelvic kidney  3  Fibroids, intramural  4. Yeast vaginitis - fluconazole (DIFLUCAN) 150 MG tablet; Take 1 tab orally every 3 days for 3 doses  Dispense: 3 tablet; Refill: 0

## 2021-09-01 LAB — CYTOLOGY - PAP
Adequacy: ABSENT
Comment: NEGATIVE
Diagnosis: NEGATIVE
High risk HPV: NEGATIVE

## 2021-09-25 ENCOUNTER — Other Ambulatory Visit: Payer: Self-pay | Admitting: Internal Medicine

## 2021-10-19 ENCOUNTER — Telehealth (HOSPITAL_BASED_OUTPATIENT_CLINIC_OR_DEPARTMENT_OTHER): Payer: Self-pay

## 2021-10-19 ENCOUNTER — Encounter (HOSPITAL_BASED_OUTPATIENT_CLINIC_OR_DEPARTMENT_OTHER): Payer: Self-pay | Admitting: Obstetrics & Gynecology

## 2021-10-19 ENCOUNTER — Other Ambulatory Visit (HOSPITAL_BASED_OUTPATIENT_CLINIC_OR_DEPARTMENT_OTHER): Payer: Self-pay | Admitting: Obstetrics & Gynecology

## 2021-10-19 DIAGNOSIS — B3731 Acute candidiasis of vulva and vagina: Secondary | ICD-10-CM

## 2021-10-19 MED ORDER — FLUCONAZOLE 150 MG PO TABS: ORAL_TABLET | ORAL | 0 refills | Status: DC

## 2021-10-19 MED ORDER — TERCONAZOLE 0.4 % VA CREA: 1.0000 | TOPICAL_CREAM | Freq: Every day | VAGINAL | 0 refills | Status: DC

## 2021-10-19 NOTE — Telephone Encounter (Signed)
Patient called office wanting to know if we would call her in a Rx for Fluconazole. Patient was treated in January with fluconazole for vaginitis. ?She would also like to have the cream to use as well because the itching this time is not only on the outside but the inside as well. No discharge.  ?I advised patient to come in to do a self swab, per protocol so that we could have an idea of what she is needing to be treated for. 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 ?Please advise. tbw ?

## 2021-11-10 ENCOUNTER — Ambulatory Visit: Payer: 59 | Admitting: Internal Medicine

## 2021-11-10 ENCOUNTER — Encounter: Payer: Self-pay | Admitting: Internal Medicine

## 2021-11-10 ENCOUNTER — Telehealth: Payer: Self-pay | Admitting: Pharmacy Technician

## 2021-11-10 ENCOUNTER — Other Ambulatory Visit (HOSPITAL_COMMUNITY): Payer: Self-pay

## 2021-11-10 VITALS — BP 126/80 | HR 97 | Ht 62.0 in | Wt 263.0 lb

## 2021-11-10 DIAGNOSIS — E1165 Type 2 diabetes mellitus with hyperglycemia: Secondary | ICD-10-CM | POA: Diagnosis not present

## 2021-11-10 DIAGNOSIS — E785 Hyperlipidemia, unspecified: Secondary | ICD-10-CM | POA: Diagnosis not present

## 2021-11-10 DIAGNOSIS — E1169 Type 2 diabetes mellitus with other specified complication: Secondary | ICD-10-CM | POA: Diagnosis not present

## 2021-11-10 DIAGNOSIS — R739 Hyperglycemia, unspecified: Secondary | ICD-10-CM

## 2021-11-10 DIAGNOSIS — Z794 Long term (current) use of insulin: Secondary | ICD-10-CM

## 2021-11-10 LAB — POCT GLYCOSYLATED HEMOGLOBIN (HGB A1C): Hemoglobin A1C: 9 % — AB (ref 4.0–5.6)

## 2021-11-10 MED ORDER — TRULICITY 0.75 MG/0.5ML ~~LOC~~ SOAJ: 0.7500 mg | SUBCUTANEOUS | 3 refills | Status: DC

## 2021-11-10 MED ORDER — SYNJARDY XR 12.5-1000 MG PO TB24: 2.0000 | ORAL_TABLET | Freq: Every day | ORAL | 3 refills | Status: DC

## 2021-11-10 MED ORDER — DEXCOM G7 SENSOR MISC: 1.0000 | Freq: Every day | 11 refills | Status: DC

## 2021-11-10 NOTE — Progress Notes (Signed)
?Name: RUHANI UMLAND  ?Age/ Sex: 61 y.o., female   ?MRN/ DOB: [id number], March 03, 1961    ? ?PCP: Binnie Rail, MD   ?Reason for Endocrinology Evaluation: Type 2 Diabetes Mellitus/ Hypercalcemia   ?Initial Endocrine Consultative Visit: 06/25/2020  ? ? ?PATIENT IDENTIFIER: Ms. DESTANY SEVERNS is a 61 y.o. female with a past medical history of T2DM, HTN, Ashtma and Primary Hyperparathyroidism ( S/P parathyroidectomy 2022). The patient has followed with Endocrinology clinic since 06/25/2020 for consultative assistance with management of her diabetes. ? ?DIABETIC HISTORY:  ?Ms. Dafoe was diagnosed with DM > 30 yrs ago. Ozempic caused GI side effects. No reported intolerance to Trajenta .She was started on Glipizide and Jardiance in 08/2020 Her hemoglobin A1c has ranged from  6.7% in 2019 to 11.7% in 2021  ? ?Glipizide was stopped by PCP , we restarted at smaller dose in 12/2020 ? ?Hyperparathyroid History  ?The patient was first diagnosed with hypercalcemia in 2015, with maximum serum calcium level of  level 11.2 mg/dL ( Non corrected)  ? ? ?Her 24- hr urinary Ca/Cr ratio of 0.012 with a 191 mg of 24-hr calcium (06/2020) ?DXA (10/06/2020) low bone density  ? ? ?She met surgical criteria  due to low GFR ? ?She is S/P left inferior parathyroidectomy 05/24/2021 ? ?SUBJECTIVE:  ? ?During the last visit (07/08/2021): A1c 8.5% , we continued Glipizide, Jardiance and Kombiglyze  ? ?Today (11/10/2021): Ms. Shareef  She checks her blood sugars multiple  times daily, through the CGM. The patient has not had hypoglycemic episodes since the last clinic visit. ? ?She admits to dietary indiscretions over the past month  due to work stress  ?Eats 2 meals a day , she has ben snacking  ? ? ?HOME DIABETES REGIMEN:  ?- Glipizide 5 mg, Half a tablet  BID ?- Jardiance 25  Mg, 1 tablet before Breakfast  ?- Kombiglyze 12-998 mg 2 tablet before Supper  ?  ? ?Statin: yes ?ACE-I/ARB: yes ? ? ? ?CONTINUOUS GLUCOSE MONITORING RECORD  INTERPRETATION   ? ?Dates of Recording: 3/23-11/10/2021 ? ?Sensor description:freestyle libre ? ?Results statistics: ?  ?CGM use % of time 71  ?Average and SD 209/25.5  ?Time in range 36%  ?% Time Above 180 39  ?% Time above 250 25  ?% Time Below target 0  ? ?Glycemic patterns summary: BG's have been noted to be above goal all night and day, worse during the day  ? ?Hyperglycemic episodes  post prandial ? ?Hypoglycemic episodes occurred N/A ? ?Overnight periods: trends down but remains above goal  ? ? ? ? ? ?DIABETIC COMPLICATIONS: ?Microvascular complications:  ?CKD III ?Denies: Neuropathy, retinopathy ?Last Eye Exam: Completed 03/2021 ? ?Macrovascular complications:  ? ?Denies: CAD, CVA, PVD ? ? ?HISTORY:  ?Past Medical History:  ?Past Medical History:  ?Diagnosis Date  ? Allergy   ? Anemia   ? Arthritis   ? Asthma   ? 48yr ago  ? Chronic deafness of both ears   ? left ear has 40% hearing-right ear has 60 % hearing  ? Colon polyps   ? Complication of anesthesia   ? Hard to wake up  ? Constipation   ? Diabetes mellitus without complication ([place]   ? Heartburn   ? High cholesterol   ? Migraines   ? none in last 11 yrs  ? Obesity   ? Pelvic kidney   ? left  ? Pneumonia   ? Post partum depression   ? Urinary incontinence   ? ?  Past Surgical History:  ?Past Surgical History:  ?Procedure Laterality Date  ? CESAREAN SECTION    ? COLONOSCOPY WITH PROPOFOL N/A 03/24/2015  ? Procedure: COLONOSCOPY WITH PROPOFOL;  Surgeon: Juanita Craver, MD;  Location: [facility];  Service: Endoscopy;  Laterality: N/A;  ? HEMORRHOID BANDING  08/2017, 09/2017  ? KNEE ARTHROSCOPY WITH MEDIAL MENISECTOMY Left 04/17/2014  ? Procedure: LEFT KNEE ARTHROSCOPY WITH PARTIAL MEDIAL MENISCECTOMY AND CHONDROPLASTY;  Surgeon: Johnn Hai, MD;  Location: [facility];  Service: Orthopedics;  Laterality: Left;  ? PARATHYROIDECTOMY Left 05/24/2021  ? Procedure: LEFT INFERIOR PARATHYROIDECTOMY, POSSIBLE NECK EXPLORATION;  Surgeon: Armandina Gemma, MD;  Location: [facility];   Service: General;  Laterality: Left;  ? TYMPANOSTOMY TUBE PLACEMENT Right   ? ?Social History:  reports that she has never smoked. She has never used smokeless tobacco. She reports that she does not currently use alcohol. She reports that she does not use drugs. ?Family History:  ?Family History  ?Problem Relation Age of Onset  ? Hypertension Mother   ? Arthritis Mother   ? Hypercalcemia Mother   ? Obesity Mother   ? Allergic rhinitis Mother   ? Asthma Mother   ? Hypertension Sister   ? Deep vein thrombosis Sister   ? Pulmonary embolism Sister   ? Asthma Daughter   ? Diabetes Maternal Grandmother   ? Heart failure Maternal Grandmother   ? Heart disease Maternal Grandmother   ? Hyperlipidemia Maternal Grandmother   ? Hypertension Maternal Grandmother   ? Hypertension Maternal Grandfather   ? Hyperlipidemia Maternal Grandfather   ? Breast cancer Paternal Aunt   ?     diagnosed in her 84's  ? Breast cancer Paternal Grandmother   ? ? ? ?HOME MEDICATIONS: ?Allergies as of 11/10/2021   ? ?   Reactions  ? Linzess [linaclotide] Other (See Comments)  ? Abdominal pain   ? Pravastatin Other (See Comments)  ? Body aches  ? Adhesive [tape]   ? Pulls skin off  ? Atorvastatin   ? Muscle aches, stomach upset  ? Crestor [rosuvastatin]   ? Stomach pain  ? Latex Itching  ? Ozempic (0.25 Or 0.5 Mg-dose) [semaglutide(0.25 Or 0.58m-dos)] Nausea And Vomiting  ? Stomach pain  ? Toradol [ketorolac Tromethamine]   ? Joint pain and swelling  ? Tramadol Other (See Comments)  ? JOINT PAIN  ? ?  ? ?  ?Medication List  ?  ? ?  ? Accurate as of November 10, 2021  7:47 AM. If you have any questions, ask your nurse or doctor.  ?  ?  ? ?  ? ?STOP taking these medications   ? ?HYDROcodone-acetaminophen 5-325 MG tablet ?Commonly known as: NORCO/VICODIN ?Stopped by: IDorita Sciara MD ?  ? ?  ? ?TAKE these medications   ? ?CERAVE DAILY MOISTURIZING EX ?Apply 1 application topically in the morning and at bedtime. Mixed with triamcinolone cream ?   ?cetirizine 10 MG tablet ?Commonly known as: ZYRTEC ?Take 1 tablet (10 mg total) by mouth daily. ?  ?chlorhexidine 0.12 % solution ?Commonly known as: PERIDEX ?Use as directed 15 mLs in the mouth or throat in the morning and at bedtime. ?  ?fluconazole 150 MG tablet ?Commonly known as: DIFLUCAN ?Take 1 tab orally every 3 days for 3 doses ?  ?FreeStyle LAlbion2 Reader DKerrin Mo?UAD to check sugars.  Dx NIDDM, E11.65 ?  ?FreeStyle Libre 2 Sensor Misc ?USE AS DIRECTED TO CHECK SUGARS DX E11.65, NIDDM ?  ?FREESTYLE TEST  STRIPS test strip ?Generic drug: glucose blood ?Use as instructed ?  ?glipiZIDE 5 MG tablet ?Commonly known as: GLUCOTROL ?Take 0.5 tablets (2.5 mg total) by mouth 2 (two) times daily before a meal. ?  ?Jardiance 25 MG Tabs tablet ?Generic drug: empagliflozin ?TAKE 1 TABLET BY MOUTH DAILY BEFORE BREAKFAST. ?  ?Kombiglyze XR 12-998 MG Tb24 ?Generic drug: Saxagliptin-Metformin ?TAKE 2 TABLETS BY MOUTH DAILY WITH SUPPER. ?  ?losartan 25 MG tablet ?Commonly known as: COZAAR ?TAKE 1 TABLET BY MOUTH EVERY DAY ?  ?olopatadine 0.1 % ophthalmic solution ?Commonly known as: PATANOL ?INSTILL 1 DROP INTO BOTH EYES TWICE A DAY ?  ?rosuvastatin 5 MG tablet ?Commonly known as: Crestor ?Take 1 tablet (5 mg total) by mouth as directed. ?What changed: when to take this ?  ?terconazole 0.4 % vaginal cream ?Commonly known as: TERAZOL 7 ?Place 1 applicator vaginally at bedtime. Use for 7 nights.  Can also apply small amount externally twice daily as well. ?  ?triamcinolone ointment 0.1 % ?Commonly known as: KENALOG ?Apply 1 application topically 2 (two) times daily. ?What changed:  ?when to take this ?additional instructions ?  ?Vitamin D3 50 MCG (2000 UT) Tabs ?Take 2,000 Units by mouth in the morning. ?  ? ?  ? ? ? ?OBJECTIVE:  ? ?Vital Signs: BP 126/80 (BP Location: Left Arm, Patient Position: Sitting, Cuff Size: Large)   Pulse 97   Ht 5' 2"  (1.575 m)   Wt 263 lb (119.3 kg)   SpO2 97%   BMI 48.10 kg/m?   ?Wt Readings  from Last 3 Encounters:  ?11/10/21 263 lb (119.3 kg)  ?08/30/21 260 lb 9.6 oz (118.2 kg)  ?07/08/21 262 lb (118.8 kg)  ? ? ? ?Exam: ?General: Pt appears well and is in NAD  ?Neck: General: Supple without adenopathy. ?Thyroid: Thyr

## 2021-11-10 NOTE — Patient Instructions (Addendum)
-   Stop Kombiglyze  ?- Stop Jardiance  ?- Start Synjardy 12.12-998 mg TWO tablets daily  ?- Start Trulicity 3.21 mg weekly  ?- Continue Glipizide 5 mg, Half a tablet before Breakfast and Supper  ? ? ? ?HOW TO TREAT LOW BLOOD SUGARS (Blood sugar LESS THAN 70 MG/DL) ?Please follow the RULE OF 15 for the treatment of hypoglycemia treatment (when your (blood sugars are less than 70 mg/dL)  ? ?STEP 1: Take 15 grams of carbohydrates when your blood sugar is low, which includes:  ?3-4 GLUCOSE TABS  OR ?3-4 OZ OF JUICE OR REGULAR SODA OR ?ONE TUBE OF GLUCOSE GEL   ? ?STEP 2: RECHECK blood sugar in 15 MINUTES ?STEP 3: If your blood sugar is still low at the 15 minute recheck --> then, go back to STEP 1 and treat AGAIN with another 15 grams of carbohydrates. ?Stop Jardiance stop Jardiance start Synjardy 12 point 12-998, 2 tabs daily Synjardy 12.12-998 mg, ?

## 2021-11-10 NOTE — Telephone Encounter (Signed)
Patient Advocate Encounter ? ?Received notification from [place] that prior authorization for [employer] is required. ?  ?PA submitted on 4.5.23 ?Key [id number] ?Status is pending ?  ?[facility] will continue to follow ? ?Princess Karnes R Paula Busenbark, CPhT ?Patient Advocate ?[facility] ?Phone: [phone] ?Fax:  [phone] ? ?

## 2021-12-02 NOTE — Telephone Encounter (Signed)
Patient Advocate Encounter ? ?Received notification from [place] that the request for prior authorization for Dexcom G7 sensors has been denied due to the patient not being on a 3 or more insulin injections per day or use an insulin infusion pump.  ?  ? ?[facility] Patient Advocate ?Fax: [phone]  ?

## 2021-12-20 ENCOUNTER — Encounter: Payer: Self-pay | Admitting: Internal Medicine

## 2021-12-20 NOTE — Progress Notes (Signed)
? ? ?Subjective:  ? ? Patient ID: Jill [name], female    DOB: April 16, 1961, 61 y.o.   MRN: [id number] ? ? ? ? ? ?HPI ?Jill [name] is here for  ?Chief Complaint  ?Patient presents with  ? Annual Exam  ? ? ?Overall she is doing fairly well.  She states she does feel much better since having her parathyroid gland removed.  Her energy level is better. ? ? ? ?Sugars 78-150 throughout the day.  Occ 178 at most.   ? ? ?Medications and allergies reviewed with patient and updated if appropriate. ? ?Current Outpatient Medications on File Prior to Visit  ?Medication Sig Dispense Refill  ? cetirizine (ZYRTEC) 10 MG tablet Take 1 tablet (10 mg total) by mouth daily. 90 tablet 3  ? chlorhexidine (PERIDEX) 0.12 % solution Use as directed 15 mLs in the mouth or throat in the morning and at bedtime.    ? Cholecalciferol (VITAMIN D3) 50 MCG (2000 UT) TABS Take 2,000 Units by mouth in the morning.    ? Continuous Blood Gluc Receiver (FREESTYLE LIBRE 2 READER) DEVI UAD to check sugars.  Dx NIDDM, E11.65 1 each 0  ? Continuous Blood Gluc Sensor (DEXCOM G7 SENSOR) MISC 1 Device by Does not apply route daily in the afternoon. 3 each 11  ? Dulaglutide (TRULICITY) 7.89 FY/1.0FB SOPN Inject 0.75 mg into the skin once a week. 6 mL 3  ? Emollient (CERAVE DAILY MOISTURIZING EX) Apply 1 application topically in the morning and at bedtime. Mixed with triamcinolone cream    ? Empagliflozin-metFORMIN HCl ER (SYNJARDY XR) 12.12-998 MG TB24 Take 2 tablets by mouth daily. 180 tablet 3  ? glipiZIDE (GLUCOTROL) 5 MG tablet Take 0.5 tablets (2.5 mg total) by mouth 2 (two) times daily before a meal. 90 tablet 3  ? glucose blood (FREESTYLE TEST STRIPS) test strip Use as instructed 100 each 12  ? losartan (COZAAR) 25 MG tablet TAKE 1 TABLET BY MOUTH EVERY DAY 30 tablet 5  ? olopatadine (PATANOL) 0.1 % ophthalmic solution INSTILL 1 DROP INTO BOTH EYES TWICE A DAY 5 mL 5  ? rosuvastatin (CRESTOR) 5 MG tablet Take 1 tablet (5 mg total) by mouth as directed.  (Patient taking differently: Take 5 mg by mouth every Sunday.) 13 tablet 3  ? triamcinolone ointment (KENALOG) 0.1 % Apply 1 application topically 2 (two) times daily. (Patient taking differently: Apply 1 application. topically in the morning and at bedtime. Mix with cerave moisturizing cream) 454 g 3  ? ?No current facility-administered medications on file prior to visit.  ? ? ?Review of Systems  ?Constitutional:  Negative for fever.  ?HENT:  Positive for postnasal drip.   ?Eyes:  Negative for visual disturbance.  ?Respiratory:  Negative for cough, shortness of breath and wheezing.   ?Cardiovascular:  Positive for leg swelling (only with being sedentary - none now). Negative for chest pain and palpitations.  ?Gastrointestinal:  Negative for abdominal pain, blood in stool, constipation, diarrhea and nausea.  ?     Occ gerd - a little more recently  ?Genitourinary:  Negative for dysuria.  ?Musculoskeletal:  Positive for arthralgias (knee pain - left). Negative for back pain.  ?Skin:  Negative for rash.  ?Neurological:  Negative for light-headedness and headaches.  ?Psychiatric/Behavioral:  Negative for dysphoric mood. The patient is not nervous/anxious.   ? ?   ?Objective:  ? ?Vitals:  ? 12/21/21 1021  ?BP: 126/76  ?Pulse: 97  ?Temp: 98.2 ?F (36.8 ?C)  ?SpO2:  97%  ? ?Filed Weights  ? 12/21/21 1021  ?Weight: 256 lb 3.2 oz (116.2 kg)  ? ?Body mass index is 46.86 kg/m?. ? ?BP Readings from Last 3 Encounters:  ?12/21/21 126/76  ?11/10/21 126/80  ?08/30/21 131/64  ? ? ?Wt Readings from Last 3 Encounters:  ?12/21/21 256 lb 3.2 oz (116.2 kg)  ?11/10/21 263 lb (119.3 kg)  ?08/30/21 260 lb 9.6 oz (118.2 kg)  ? ? ? ?  12/21/2021  ? 10:32 AM 08/30/2021  ?  2:49 PM 10/23/2019  ? 10:55 AM 04/08/2019  ?  2:06 PM 08/03/2016  ?  2:33 PM  ?Depression screen PHQ 2/9  ?Decreased Interest 0 0 3 0 0  ?Down, Depressed, Hopeless 0 0 1 0 0  ?PHQ - 2 Score 0 0 4 0 0  ?Altered sleeping 1  1    ?Tired, decreased energy 1  0    ?Change in appetite  1  1    ?Feeling bad or failure about yourself  0  0    ?Trouble concentrating 1  0    ?Moving slowly or fidgety/restless 0  0    ?Suicidal thoughts 0  0    ?PHQ-9 Score 4  6    ?Difficult doing work/chores Not difficult at all  Not difficult at all    ? ? ? ?   ? View : No data to display.  ?  ?  ?  ? ? ? ? ?  ?Physical Exam ?Constitutional: She appears well-developed and well-nourished. No distress.  ?HENT:  ?Head: Normocephalic and atraumatic.  ?Right Ear: External ear normal. Normal ear canal and TM ?Left Ear: External ear normal.  Normal ear canal and TM ?Mouth/Throat: Oropharynx is clear and moist.  ?Eyes: Conjunctivae normal.  ?Neck: Neck supple. No tracheal deviation present. No thyromegaly present.  ?No carotid bruit  ?Cardiovascular: Normal rate, regular rhythm and normal heart sounds.   ?No murmur heard.  No edema. ?Pulmonary/Chest: Effort normal and breath sounds normal. No respiratory distress. She has no wheezes. She has no rales.  ?Breast: deferred   ?Abdominal: Soft. She exhibits no distension. There is no tenderness.  ?Lymphadenopathy: She has no cervical adenopathy.  ?Skin: Skin is warm and dry. She is not diaphoretic.  ?Psychiatric: She has a normal mood and affect. Her behavior is normal.  ? ? ? ?Lab Results  ?Component Value Date  ? WBC 6.3 05/18/2021  ? HGB 14.4 05/18/2021  ? HCT 45.0 05/18/2021  ? PLT 220 05/18/2021  ? GLUCOSE 171 (H) 07/08/2021  ? CHOL 151 07/08/2021  ? TRIG 62.0 07/08/2021  ? HDL 43.80 07/08/2021  ? LDLDIRECT 125 (H) 09/10/2014  ? [place] 95 07/08/2021  ? ALT 22 05/18/2021  ? AST 14 (L) 05/18/2021  ? NA 141 07/08/2021  ? K 4.6 07/08/2021  ? CL 105 07/08/2021  ? CREATININE 1.10 07/08/2021  ? BUN 13 07/08/2021  ? CO2 29 07/08/2021  ? TSH 1.29 12/15/2020  ? HGBA1C 9.0 (A) 11/10/2021  ? MICROALBUR <0.7 09/30/2020  ? ? ? ? ?   ?Assessment & Plan:  ? ?Physical exam: ?Screening blood work  ordered ?Exercise  none - trying to increase walking ?Weight  encouraged weight  loss-discussed decreased portions, increasing exercise.  Hopefully the Trulicity will help some with weight loss ?Substance abuse  none ? ? ?Reviewed recommended immunizations. ? ? ?Health Maintenance  ?Topic Date Due  ? FOOT EXAM  05/29/2018  ? COVID-19 Vaccine (4 - Booster for [employer] series)  01/06/2022 (Originally 10/14/2020)  ? OPHTHALMOLOGY EXAM  03/22/2022  ? HEMOGLOBIN A1C  05/12/2022  ? MAMMOGRAM  08/31/2023  ? PAP SMEAR-Modifier  08/30/2024  ? COLONOSCOPY (Pts 45-52yr Insurance coverage will need to be confirmed)  03/23/2025  ? TETANUS/TDAP  12/27/2026  ? Hepatitis C Screening  Completed  ? HIV Screening  Completed  ? HPV VACCINES  Aged Out  ? Zoster Vaccines- Shingrix  Discontinued  ?  ? ? ? ? ? ? ?See Problem List for Assessment and Plan of chronic medical problems. ? ? ? ? ?

## 2021-12-20 NOTE — Patient Instructions (Addendum)
? ? ? ?Blood work was ordered.   ? ? ?Medications changes include :   none ? ? ? ?Return in about 6 months (around 06/23/2022) for follow up. ? ? ?Health Maintenance, Female ?Adopting a healthy lifestyle and getting preventive care are important in promoting health and wellness. Ask your health care provider about: ?The right schedule for you to have regular tests and exams. ?Things you can do on your own to prevent diseases and keep yourself healthy. ?What should I know about diet, weight, and exercise? ?Eat a healthy diet ? ?Eat a diet that includes plenty of vegetables, fruits, low-fat dairy products, and lean protein. ?Do not eat a lot of foods that are high in solid fats, added sugars, or sodium. ?Maintain a healthy weight ?Body mass index (BMI) is used to identify weight problems. It estimates body fat based on height and weight. Your health care provider can help determine your BMI and help you achieve or maintain a healthy weight. ?Get regular exercise ?Get regular exercise. This is one of the most important things you can do for your health. Most adults should: ?Exercise for at least 150 minutes each week. The exercise should increase your heart rate and make you sweat (moderate-intensity exercise). ?Do strengthening exercises at least twice a week. This is in addition to the moderate-intensity exercise. ?Spend less time sitting. Even light physical activity can be beneficial. ?Watch cholesterol and blood lipids ?Have your blood tested for lipids and cholesterol at 60 years of age, then have this test every 5 years. ?Have your cholesterol levels checked more often if: ?Your lipid or cholesterol levels are high. ?You are older than 60 years of age. ?You are at high risk for heart disease. ?What should I know about cancer screening? ?Depending on your health history and family history, you may need to have cancer screening at various ages. This may include screening for: ?Breast cancer. ?Cervical  cancer. ?Colorectal cancer. ?Skin cancer. ?Lung cancer. ?What should I know about heart disease, diabetes, and high blood pressure? ?Blood pressure and heart disease ?High blood pressure causes heart disease and increases the risk of stroke. This is more likely to develop in people who have high blood pressure readings or are overweight. ?Have your blood pressure checked: ?Every 3-5 years if you are 2-36 years of age. ?Every year if you are 64 years old or older. ?Diabetes ?Have regular diabetes screenings. This checks your fasting blood sugar level. Have the screening done: ?Once every three years after age 67 if you are at a normal weight and have a low risk for diabetes. ?More often and at a younger age if you are overweight or have a high risk for diabetes. ?What should I know about preventing infection? ?Hepatitis B ?If you have a higher risk for hepatitis B, you should be screened for this virus. Talk with your health care provider to find out if you are at risk for hepatitis B infection. ?Hepatitis C ?Testing is recommended for: ?Everyone born from [birth year] through 1964. ?Anyone with known risk factors for hepatitis C. ?Sexually transmitted infections (STIs) ?Get screened for STIs, including gonorrhea and chlamydia, if: ?You are sexually active and are younger than 60 years of age. ?You are older than 60 years of age and your health care provider tells you that you are at risk for this type of infection. ?Your sexual activity has changed since you were last screened, and you are at increased risk for chlamydia or gonorrhea. Ask your health care  provider if you are at risk. ?Ask your health care provider about whether you are at high risk for HIV. Your health care provider may recommend a prescription medicine to help prevent HIV infection. If you choose to take medicine to prevent HIV, you should first get tested for HIV. You should then be tested every 3 months for as long as you are taking the  medicine. ?Pregnancy ?If you are about to stop having your period (premenopausal) and you may become pregnant, seek counseling before you get pregnant. ?Take 400 to 800 micrograms (mcg) of folic acid every day if you become pregnant. ?Ask for birth control (contraception) if you want to prevent pregnancy. ?Osteoporosis and menopause ?Osteoporosis is a disease in which the bones lose minerals and strength with aging. This can result in bone fractures. If you are 6 years old or older, or if you are at risk for osteoporosis and fractures, ask your health care provider if you should: ?Be screened for bone loss. ?Take a calcium or vitamin D supplement to lower your risk of fractures. ?Be given hormone replacement therapy (HRT) to treat symptoms of menopause. ?Follow these instructions at home: ?Alcohol use ?Do not drink alcohol if: ?Your health care provider tells you not to drink. ?You are pregnant, may be pregnant, or are planning to become pregnant. ?If you drink alcohol: ?Limit how much you have to: ?0-1 drink a day. ?Know how much alcohol is in your drink. In the [place] one drink equals one 12 oz bottle of beer (355 mL), one 5 oz glass of wine (148 mL), or one 1? oz glass of hard liquor (44 mL). ?Lifestyle ?Do not use any products that contain nicotine or tobacco. These products include cigarettes, chewing tobacco, and vaping devices, such as e-cigarettes. If you need help quitting, ask your health care provider. ?Do not use street drugs. ?Do not share needles. ?Ask your health care provider for help if you need support or information about quitting drugs. ?General instructions ?Schedule regular health, dental, and eye exams. ?Stay current with your vaccines. ?Tell your health care provider if: ?You often feel depressed. ?You have ever been abused or do not feel safe at home. ?Summary ?Adopting a healthy lifestyle and getting preventive care are important in promoting health and wellness. ?Follow your health care  provider's instructions about healthy diet, exercising, and getting tested or screened for diseases. ?Follow your health care provider's instructions on monitoring your cholesterol and blood pressure. ?This information is not intended to replace advice given to you by your health care provider. Make sure you discuss any questions you have with your health care provider. ?Document Revised: 12/14/2020 Document Reviewed: 12/14/2020 ?Elsevier Patient Education ? [place]. ? ?

## 2021-12-21 ENCOUNTER — Encounter (INDEPENDENT_AMBULATORY_CARE_PROVIDER_SITE_OTHER): Payer: 59 | Admitting: Internal Medicine

## 2021-12-21 ENCOUNTER — Ambulatory Visit (INDEPENDENT_AMBULATORY_CARE_PROVIDER_SITE_OTHER): Payer: 59 | Admitting: Internal Medicine

## 2021-12-21 VITALS — BP 126/76 | HR 97 | Temp 98.2°F | Ht 62.0 in | Wt 256.2 lb

## 2021-12-21 DIAGNOSIS — I10 Essential (primary) hypertension: Secondary | ICD-10-CM

## 2021-12-21 DIAGNOSIS — G4733 Obstructive sleep apnea (adult) (pediatric): Secondary | ICD-10-CM

## 2021-12-21 DIAGNOSIS — E782 Mixed hyperlipidemia: Secondary | ICD-10-CM | POA: Diagnosis not present

## 2021-12-21 DIAGNOSIS — E1165 Type 2 diabetes mellitus with hyperglycemia: Secondary | ICD-10-CM

## 2021-12-21 DIAGNOSIS — Z0000 Encounter for general adult medical examination without abnormal findings: Secondary | ICD-10-CM | POA: Diagnosis not present

## 2021-12-21 DIAGNOSIS — K760 Fatty (change of) liver, not elsewhere classified: Secondary | ICD-10-CM

## 2021-12-21 LAB — COMPREHENSIVE METABOLIC PANEL
ALT: 26 U/L (ref 0–35)
AST: 17 U/L (ref 0–37)
Albumin: 4.5 g/dL (ref 3.5–5.2)
Alkaline Phosphatase: 65 U/L (ref 39–117)
BUN: 13 mg/dL (ref 6–23)
CO2: 27 mEq/L (ref 19–32)
Calcium: 9.8 mg/dL (ref 8.4–10.5)
Chloride: 104 mEq/L (ref 96–112)
Creatinine, Ser: 0.96 mg/dL (ref 0.40–1.20)
GFR: 64.66 mL/min (ref >60.00)
Glucose, Bld: 96 mg/dL (ref 70–99)
Potassium: 4.2 mEq/L (ref 3.5–5.1)
Sodium: 139 mEq/L (ref 135–145)
Total Bilirubin: 0.4 mg/dL (ref 0.2–1.2)
Total Protein: 7.8 g/dL (ref 6.0–8.3)

## 2021-12-21 LAB — MICROALBUMIN / CREATININE URINE RATIO
Creatinine,U: 85.3 mg/dL
Microalb Creat Ratio: 0.8 mg/g (ref 0.0–30.0)
Microalb, Ur: <0.7 mg/dL (ref 0.0–1.9)

## 2021-12-21 LAB — CBC WITH DIFFERENTIAL/PLATELET
Basophils Absolute: 0 10*3/uL (ref 0.0–0.1)
Basophils Relative: 0.5 % (ref 0.0–3.0)
Eosinophils Absolute: 0 10*3/uL (ref 0.0–0.7)
Eosinophils Relative: 0.9 % (ref 0.0–5.0)
HCT: 42.4 % (ref 36.0–46.0)
Hemoglobin: 14 g/dL (ref 12.0–15.0)
Lymphocytes Relative: 30.2 % (ref 12.0–46.0)
Lymphs Abs: 1.5 10*3/uL (ref 0.7–4.0)
MCHC: 33 g/dL (ref 30.0–36.0)
MCV: 81.3 fl (ref 78.0–100.0)
Monocytes Absolute: 0.4 10*3/uL (ref 0.1–1.0)
Monocytes Relative: 7.9 % (ref 3.0–12.0)
Neutro Abs: 3 10*3/uL (ref 1.4–7.7)
Neutrophils Relative %: 60.5 % (ref 43.0–77.0)
Platelets: 216 10*3/uL (ref 150.0–400.0)
RBC: 5.22 Mil/uL — ABNORMAL HIGH (ref 3.87–5.11)
RDW: 16.3 % — ABNORMAL HIGH (ref 11.5–15.5)
WBC: 5 10*3/uL (ref 4.0–10.5)

## 2021-12-21 LAB — LIPID PANEL
Cholesterol: 184 mg/dL (ref 0–200)
HDL: 43.3 mg/dL (ref >39.00)
LDL Cholesterol: 126 mg/dL — ABNORMAL HIGH (ref 0–99)
NonHDL: 140.55
Total CHOL/HDL Ratio: 4
Triglycerides: 72 mg/dL (ref 0.0–149.0)
VLDL: 14.4 mg/dL (ref 0.0–40.0)

## 2021-12-21 NOTE — Assessment & Plan Note (Addendum)
Chronic ?Was not ideally controlled, but medications recently switched and sugars are much better controlled at home-may be too low at times-she will discuss with Endo ?now seeing Dr. Philis Fendt per her ?Will check urine microalbumin ?Stressed the importance of regular exercise, weight loss, strict compliance with a diabetic diet ?

## 2021-12-21 NOTE — Assessment & Plan Note (Signed)
Chronic ?Reviewed that she does have a fatty liver and this does increase her risk of liver cirrhosis ?Stressed weight loss has been the only thing that would help ?

## 2021-12-21 NOTE — Assessment & Plan Note (Addendum)
Chronic ?Regular exercise and healthy diet encouraged ?Check lipid panel  ?Currently taking Crestor 5 mg once weekly, which she is tolerating well ?We will check lipids today ?Encouraged her to try taking the Crestor twice weekly to see how she does with that ?

## 2021-12-21 NOTE — Assessment & Plan Note (Addendum)
Chronic ?Not on cpap ?Discussed the importance of weight loss ?

## 2021-12-21 NOTE — Assessment & Plan Note (Signed)
Chronic ?Blood pressure well controlled ?CMP, CBC ?Continue losartan 25 mg daily ?

## 2021-12-24 ENCOUNTER — Telehealth: Payer: Self-pay

## 2021-12-24 ENCOUNTER — Encounter: Payer: Self-pay | Admitting: Internal Medicine

## 2021-12-24 MED ORDER — GLIPIZIDE 5 MG PO TABS: 2.5000 mg | ORAL_TABLET | Freq: Every day | ORAL | 3 refills | Status: DC

## 2021-12-24 NOTE — Telephone Encounter (Signed)
Please see the MyChart message reply(ies) for my assessment and plan.    This patient gave consent for this Medical Advice Message and is aware that it may result in a bill to [employer], as well as the possibility of receiving a bill for a co-payment or deductible. They are an established patient, but are not seeking medical advice exclusively about a problem treated during an in person or video visit in the last seven days. I did not recommend an in person or video visit within seven days of my reply.     CONTINUOUS GLUCOSE MONITORING RECORD INTERPRETATION    Dates of Recording: May/5-May/18/2023  Sensor description: Freestyle libre  Results statistics:   CGM use % of time 86%  Average and SD 144/28.2  Time in range 81     %  % Time Above 180 18  % Time above 250 1  % Time Below target 0    Glycemic patterns summary: BG's have been optimal through the night and day  Hyperglycemic episodes  Postprandial Hypoglycemic episodes occurred N/A but she did have tight BG's as low as 83 mg/DL  Overnight periods: At goal    I spent a total of 10  minutes cumulative time within 7 days through [employer].  Dorita Sciara, MD

## 2021-12-24 NOTE — Telephone Encounter (Signed)
Per patient her  glucose levels have drop alot. She hasn't hit below 64 but fees shakes sometimes.

## 2022-01-05 ENCOUNTER — Ambulatory Visit: Payer: Self-pay

## 2022-01-05 ENCOUNTER — Ambulatory Visit: Payer: 59 | Admitting: Family Medicine

## 2022-01-05 ENCOUNTER — Ambulatory Visit (INDEPENDENT_AMBULATORY_CARE_PROVIDER_SITE_OTHER): Payer: 59

## 2022-01-05 VITALS — BP 130/76 | HR 83 | Ht 62.0 in | Wt 256.0 lb

## 2022-01-05 DIAGNOSIS — M25551 Pain in right hip: Secondary | ICD-10-CM | POA: Diagnosis not present

## 2022-01-05 NOTE — Patient Instructions (Addendum)
Thank you for coming in today.   Please get an Xray today before you leave   You received an injection today. Seek immediate medical attention if the joint becomes red, extremely painful, or is oozing fluid.   Work on weight loss. BMI goal is <40 (about 40 pounds).   Recheck in 1 month.    I've referred you to Physical Therapy.  Let us know if you don't hear from them in one week.

## 2022-01-05 NOTE — Progress Notes (Signed)
I, Peterson Lombard, LAT, ATC acting as a scribe for Lynne Leader, MD.  Jill [name] is a 61 y.o. female who presents to [place] at [facility] today for R hip pain. Pt was previously seen by Dr. Georgina Snell in 2021-22 for bilat knees and L ankle pain. Today, pt c/o R hip pain x 2-3 weeks. Pt locates pain to the anterior aspect of the R hip, along the crease. Pt works at [employer] doing [employer] and sits all day for work.  Low back pain: no Mechanical symptoms: yes Radiates: yes- just slightly on anterior thigh  LE Numbness/tingling: no LE Weakness: yes- feels like R leg is going to give out Aggravates: walking, prolonged sitting Treatments tried: IBU, tylenol   Pertinent review of systems: No fevers or chills  Relevant historical information: Diabetes and obesity   Exam:  BP 130/76   Pulse 83   Ht '5\' 2"'$  (1.575 m)   Wt 256 lb (116.1 kg)   SpO2 97%   BMI 46.82 kg/m  General: Well Developed, well nourished, and in no acute distress.   MSK: Right hip normal. Nontender to palpation. Decreased range of motion to flexion and internal rotation with pain. Hip flexion strength is reduced and painful.    Lab and Radiology Results  Procedure: Real-time Ultrasound Guided Injection of right hip joint anterior approach Device: Philips Affiniti 50G Images permanently stored and available for review in PACS Verbal informed consent obtained.  Discussed risks and benefits of procedure. Warned about infection, bleeding, hyperglycemia damage to structures among others. Patient expresses understanding and agreement Time-out conducted.   Noted no overlying erythema, induration, or other signs of local infection.   Skin prepped in a sterile fashion.   Local anesthesia: Topical Ethyl chloride.   With sterile technique and under real time ultrasound guidance: 40 mg of Kenalog and 2 mL of Marcaine injected into hip joint. Fluid seen entering the joint capsule.   Completed without  difficulty   Pain moderately immediately resolved suggesting accurate placement of the medication.   Advised to call if fevers/chills, erythema, induration, drainage, or persistent bleeding.   Images permanently stored and available for review in the ultrasound unit.  Impression: Technically successful ultrasound guided injection.    X-ray images right hip obtained today personally and independently interpreted Mild degenerative changes.  No acute fractures. Await formal radiology review     Assessment and Plan: 61 y.o. female with right anterior hip pain.  Etiology is unclear.  Labrum tear is a possibility as it is more significant degenerative changes than is apparent on x-ray.  She had moderate immediate benefit to diagnostic and therapeutic injection indicating that it big proportion of her pain is related to intra-articular hip.  Plan for aquatic physical therapy and recheck in 1 month.  Weight loss will be very helpful especially in the future for potential hip replacement.  BMI needs to be under 40.  Currently 46.8.   PDMP not reviewed this encounter. Orders Placed This Encounter  Procedures   [place] LIMITED JOINT SPACE STRUCTURES LOW RIGHT(NO LINKED CHARGES)    Order Specific Question:   Reason for Exam (SYMPTOM  OR DIAGNOSIS REQUIRED)    Answer:   right hip pain    Order Specific Question:   Preferred imaging location?    Answer:   [facility]   DG HIP UNILAT W OR W/O PELVIS 2-3 VIEWS RIGHT    Standing Status:   Future    Number of  Occurrences:   1    Standing Expiration Date:   02/04/2022    Order Specific Question:   Reason for Exam (SYMPTOM  OR DIAGNOSIS REQUIRED)    Answer:   right hip pain    Order Specific Question:   Preferred imaging location?    Answer:   [facility]    Order Specific Question:   Is patient pregnant?    Answer:   No   Ambulatory referral to Physical Therapy    Referral Priority:   Routine    Referral Type:    Physical Medicine    Referral Reason:   Specialty Services Required    Requested Specialty:   Physical Therapy    Number of Visits Requested:   1   No orders of the defined types were placed in this encounter.    Discussed warning signs or symptoms. Please see discharge instructions. Patient expresses understanding.   The above documentation has been reviewed and is accurate and complete Lynne Leader, M.D.

## 2022-01-06 ENCOUNTER — Encounter: Payer: Self-pay | Admitting: Internal Medicine

## 2022-01-07 NOTE — Progress Notes (Signed)
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

## 2022-01-08 IMAGING — NM NM PARATHYROID W/ SPECT
2 series · 12 of 12 positions shown · non-contrast
Comparison: Thyroid sonogram 03/10/2021

CLINICAL DATA: Evaluate for parathyroid adenoma

EXAM:
NM PARATHYROID SCINTIGRAPHY AND SPECT IMAGING
TECHNIQUE: Following intravenous administration of radiopharmaceutical, early
and 2-hour delayed planar images were obtained in the anterior
projection. Delayed triplanar SPECT images were also obtained at 2
hours.
RADIOPHARMACEUTICALS:  26.3 mCi Xc-33m Sestamibi IV

[Series 1: spect - (id)_(id)_tra · 4.1mm · 4.14mm/px · 6 of 128 frames shown]
[frame 11/128]
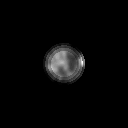
[frame 32/128]
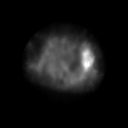
[frame 54/128]
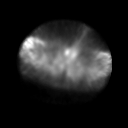
[frame 75/128]
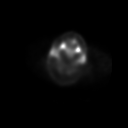
[frame 96/128]
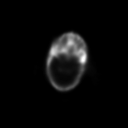
[frame 118/128]
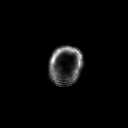

[Series 1: spect - (id)_(id)_cor · 4.1mm · 4.14mm/px · 6 of 128 frames shown]
[frame 11/128]
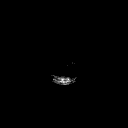
[frame 32/128]
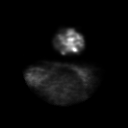
[frame 54/128]
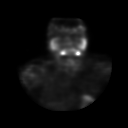
[frame 75/128]
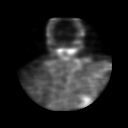
[frame 96/128]
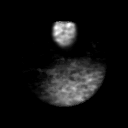
[frame 118/128]
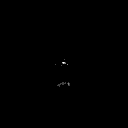

[12 of 12 positions shown; findings below may reference images not displayed]

FINDINGS: On the early images there is increased radiotracer uptake within
both lobes of the thyroid gland. On the 2 hour delayed images there
is partial washout of the radiopharmaceutical from the thyroid. No
persistent focal area of increased uptake specific for parathyroid
adenoma identified.
IMPRESSION: No specific scintigraphic findings identified indicate site of
suspected parathyroid adenoma.

## 2022-01-27 ENCOUNTER — Ambulatory Visit: Payer: 59 | Admitting: Allergy & Immunology

## 2022-01-27 ENCOUNTER — Encounter: Payer: Self-pay | Admitting: Allergy & Immunology

## 2022-01-27 VITALS — BP 124/74 | HR 94 | Temp 98.0°F | Resp 18 | Ht 62.0 in | Wt 254.2 lb

## 2022-01-27 DIAGNOSIS — L299 Pruritus, unspecified: Secondary | ICD-10-CM | POA: Diagnosis not present

## 2022-01-27 DIAGNOSIS — H1013 Acute atopic conjunctivitis, bilateral: Secondary | ICD-10-CM

## 2022-01-27 DIAGNOSIS — R053 Chronic cough: Secondary | ICD-10-CM

## 2022-01-27 DIAGNOSIS — H1010 Acute atopic conjunctivitis, unspecified eye: Secondary | ICD-10-CM

## 2022-01-27 MED ORDER — TRIAMCINOLONE ACETONIDE 0.1 % EX OINT: 1.0000 | TOPICAL_OINTMENT | Freq: Two times a day (BID) | CUTANEOUS | 3 refills | Status: DC

## 2022-01-27 MED ORDER — CETIRIZINE HCL 10 MG PO TABS: 20.0000 mg | ORAL_TABLET | Freq: Two times a day (BID) | ORAL | 1 refills | Status: DC

## 2022-01-27 MED ORDER — OLOPATADINE HCL 0.1 % OP SOLN: 1.0000 [drp] | Freq: Two times a day (BID) | OPHTHALMIC | 5 refills | Status: DC

## 2022-01-27 NOTE — Patient Instructions (Addendum)
1. 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  - We will continue with triamcinolone twice daily as needed as you are doing. - You seem to have a great handle on your symptoms.   2. Cough - with history of wheezing with COVID19 - Lung testing not done today.  3. Return in about 1 year (around 01/26/2022).   Please inform us of any Emergency Department visits, hospitalizations, or changes in symptoms. Call us before going to the ED for breathing or allergy symptoms since we might be able to fit you in for a sick visit. Feel free to contact us anytime with any questions, problems, or concerns.  It was a pleasure to see you again today!  Websites that have reliable patient information: 1. American Academy of Asthma, Allergy, and Immunology: www.aaaai.org 2. Food Allergy Research and Education (FARE): foodallergy.org 3. Mothers of Asthmatics: http://www.asthmacommunitynetwork.org 4. American College of Allergy, Asthma, and Immunology: www.acaai.org   COVID-19 Vaccine Information can be found at: ShippingScam.co.uk For questions related to vaccine distribution or appointments, please email [email or web address] or call [phone].     "Like" [place] on Facebook and Instagram for our latest updates!      Make sure you are registered to vote! If you have moved or changed any of your contact information, you will need to get this updated before voting!  In some cases, you MAY be able to register to vote online: [email or web address]

## 2022-01-31 ENCOUNTER — Other Ambulatory Visit: Payer: Self-pay | Admitting: Allergy & Immunology

## 2022-02-02 ENCOUNTER — Ambulatory Visit: Payer: 59 | Admitting: Family Medicine

## 2022-02-02 VITALS — BP 126/70 | Ht 62.0 in | Wt 254.0 lb

## 2022-02-02 DIAGNOSIS — M25551 Pain in right hip: Secondary | ICD-10-CM

## 2022-02-02 NOTE — Patient Instructions (Signed)
Good to see you   

## 2022-02-02 NOTE — Progress Notes (Signed)
Rito Ehrlich, am serving as a Education administrator for Dr. Lynne Leader.  Jill [name] is a 61 y.o. female who presents to [place] at [facility] today for f/u R hip pain. Pt works at [employer] doing [employer] and sits all day for work. Pt was last seen by Dr. Georgina Snell on 01/05/22 and was given a R hip steroid injection and recommended weight loss, and was referred to aquatic PT, but she has not yet scheduled any visits. Today, pt reports the injection worked for about a week. patient cannot do the aquatic therapy because she has anxiety for the water. She also states work has gotten really busy so she needs other options besides therapy. Patient states the pain is not as bad as it was but today has been the worst since the injection with pain radiating down the leg.   Dx imaging: 01/05/22 R hip XR  Pertinent review of systems: No fevers or chills  Relevant historical information: Diabetes.  Morbid obesity.  BMI 46.   Exam:  BP 126/70   Ht '5\' 2"'$  (1.575 m)   Wt 254 lb (115.2 kg)   BMI 46.46 kg/m  General: Well Developed, well nourished, and in no acute distress.   MSK: Right hip: Normal-appearing Decreased range of motion.    Lab and Radiology Results EXAM: DG HIP (WITH OR WITHOUT PELVIS) 2-3V RIGHT   COMPARISON:  None Available.   FINDINGS: There is no acute fracture or dislocation identified. There are mild degenerative changes of the right hip with joint space narrowing, sclerosis and osteophyte formation. There also mild degenerative changes of the left hip. Soft tissues are within normal limits. IUD is present in the pelvis.   IMPRESSION: 1. No acute bony abnormality. 2. Mild degenerative changes of the hips.     Electronically Signed   By: Ronney Asters M.D.   On: 01/07/2022 00:20 I, Lynne Leader, personally (independently) visualized and performed the interpretation of the images attached in this note.    Assessment and Plan: 61 y.o. female with chronic  right hip pain.  Patient had great improvement in pain with hip injection May 31 indicating intra-articular cause of her pain is likely her main pain generator.  Plan on MRI arthrogram to further understand the source of pain and to determine future treatment options.  However it likely one of her ultimate outcomes is going to be hip replacement.  Her BMI needs to get be under 40 in order to be able to have a hip replacement.  She currently takes Trulicity for diabetes and could be a good candidate to switch to [facility] or Ozempic which would be more effective for weight management.  I will ask her endocrinologist about this.   PDMP not reviewed this encounter. Orders Placed This Encounter  Procedures   MR HIP RIGHT W CONTRAST    Standing Status:   Future    Standing Expiration Date:   02/03/2023    Scheduling Instructions:     Schedule with Dr T 1 hr pripor to MRI. Arthrigram only. No IV contrast    Order Specific Question:   Reason for Exam (SYMPTOM  OR DIAGNOSIS REQUIRED)    Answer:   eval hip pain    Order Specific Question:   If indicated for the ordered procedure, I authorize the administration of contrast media per Radiology protocol    Answer:   Yes    Order Specific Question:   What is the patient's sedation requirement?  Answer:   No Sedation    Order Specific Question:   Does the patient have a pacemaker or implanted devices?    Answer:   No    Order Specific Question:   Preferred imaging location?    Answer:   Product/process development scientist (table limit-350lbs)   No orders of the defined types were placed in this encounter.    Discussed warning signs or symptoms. Please see discharge instructions. Patient expresses understanding.   The above documentation has been reviewed and is accurate and complete Lynne Leader, M.D.

## 2022-02-03 MED ORDER — TIRZEPATIDE 5 MG/0.5ML ~~LOC~~ SOAJ: 5.0000 mg | SUBCUTANEOUS | 1 refills | Status: DC

## 2022-02-13 ENCOUNTER — Other Ambulatory Visit: Payer: Self-pay | Admitting: Internal Medicine

## 2022-02-15 ENCOUNTER — Ambulatory Visit: Payer: 59 | Admitting: Sports Medicine

## 2022-02-15 ENCOUNTER — Ambulatory Visit (INDEPENDENT_AMBULATORY_CARE_PROVIDER_SITE_OTHER): Payer: 59

## 2022-02-15 DIAGNOSIS — G8929 Other chronic pain: Secondary | ICD-10-CM

## 2022-02-15 DIAGNOSIS — M25551 Pain in right hip: Secondary | ICD-10-CM

## 2022-02-15 MED ORDER — GADOBUTROL 1 MMOL/ML IV SOLN
1.0000 mL | Freq: Once | INTRAVENOUS | Status: AC | PRN
  Administered 2022-02-15: 1 mL via INTRAVENOUS

## 2022-02-15 NOTE — Progress Notes (Signed)
    Procedures performed today:    Procedure: Real-time Ultrasound Guided gadolinium contrast injection of right hip joint Device: Samsung HS60  Verbal informed consent obtained.  Time-out conducted.  Noted no overlying erythema, induration, or other signs of local infection.  Skin prepped in a sterile fashion.  Local anesthesia: Topical Ethyl chloride.  With sterile technique and under real time ultrasound guidance: Noted minimally arthritic hip joint, 22-gauge spinal needle advanced to the femoral head/neck junction, contacted bone and injected 1 cc kenalog 40, 2 cc lidocaine, 2 cc bupivacaine, syringe switched and 0.1 cc gadolinium injected, syringe again switched and 10 cc sterile saline used to fully distend the joint. Joint visualized and capsule seen distending confirming intra-articular placement of contrast material and medication. Completed without difficulty  Advised to call if fevers/chills, erythema, induration, drainage, or persistent bleeding.  Images permanently stored in PACS Impression: Technically successful ultrasound guided gadolinium contrast injection for MR arthrography.  Please see separate MR arthrogram report.  Independent interpretation of notes and tests performed by another provider:   None.  Brief History, Exam, Impression, and Recommendations:    Chronic right hip pain Injection today for MR arthrography right hip, further management per primary treating provider.    ____________________________________________ Gwen Her. Dianah Field, M.D., ABFM., CAQSM., AME. Primary Care and Sports Medicine [facility]  Adjunct Professor of [employer] [facility] [employer]  Risk manager

## 2022-02-15 NOTE — Assessment & Plan Note (Signed)
Injection today for MR arthrography right hip, further management per primary treating provider.

## 2022-02-18 NOTE — Progress Notes (Signed)
Right hip MRI shows mild arthritis of both hips.  You do have a small labrum tear of the right hip (radiology said left but I think that was a typo and I have asked them to correct it).  You do have some tendinitis of the tendons on the side of the hip that could also be producing pain on the side of the hip. Recommend return to clinic to talk about the results of the MRI and treatment plan and options at some point in the near future.

## 2022-02-21 NOTE — Progress Notes (Unsigned)
   I, Peterson Lombard, LAT, ATC acting as a scribe for Lynne Leader, MD.  LAKITHA GORDY is a 61 y.o. female who presents to [place] at [facility] today for f/u R hip pain and MRI arthrogram review.  Pt works at [employer] doing [employer] and sits all day for work. Pt had a R femoral acetabular joint injection on 5/31/23Pt was last seen by Dr. Georgina Snell on 02/02/22 and was advised to proceed to MRI arthrogram and her endocrinologist was asked about potentially switching her to [facility]. Today, pt reports  Dx imaging: 02/15/22 R hip MRI arthrogram 01/05/22 R hip XR  Pertinent review of systems: ***  Relevant historical information: ***   Exam:  There were no vitals taken for this visit. General: Well Developed, well nourished, and in no acute distress.   MSK: ***    Lab and Radiology Results No results found for this or any previous visit (from the past 72 hour(s)). No results found.     Assessment and Plan: 61 y.o. female with ***   PDMP not reviewed this encounter. No orders of the defined types were placed in this encounter.  No orders of the defined types were placed in this encounter.    Discussed warning signs or symptoms. Please see discharge instructions. Patient expresses understanding.   ***

## 2022-02-22 ENCOUNTER — Ambulatory Visit (INDEPENDENT_AMBULATORY_CARE_PROVIDER_SITE_OTHER): Payer: 59 | Admitting: Family Medicine

## 2022-02-22 VITALS — BP 188/92 | HR 85 | Ht 62.0 in | Wt 248.0 lb

## 2022-02-22 DIAGNOSIS — M25551 Pain in right hip: Secondary | ICD-10-CM | POA: Diagnosis not present

## 2022-02-22 NOTE — Patient Instructions (Addendum)
Thank you for coming in today.   I've referred you to Orthopedic Surgery.  Let us know if you don't hear from them in one week.   Check back with me as needed

## 2022-02-27 ENCOUNTER — Other Ambulatory Visit: Payer: Self-pay | Admitting: Allergy & Immunology

## 2022-03-04 ENCOUNTER — Other Ambulatory Visit: Payer: Self-pay | Admitting: Internal Medicine

## 2022-03-07 ENCOUNTER — Ambulatory Visit (INDEPENDENT_AMBULATORY_CARE_PROVIDER_SITE_OTHER): Payer: 59 | Admitting: Orthopaedic Surgery

## 2022-03-07 DIAGNOSIS — M5416 Radiculopathy, lumbar region: Secondary | ICD-10-CM

## 2022-03-07 NOTE — Progress Notes (Signed)
Chief Complaint: Right thigh and hip pain     History of Present Illness:    Jill [name] is a 61 y.o. female presents today with ongoing right hip and thigh pain as a referral from Dr. Georgina Snell.  She is here today for further assessment to see if she would be a possible candidate for hip arthroscopy.  Overall she states that she has had pain in the right hip area since May 2023.  She not have any known injury.  She does feel like the pain radiates.  She has not had any physical therapy.  She has had 2 injections in June and July into the femoral acetabular joint with the last injection providing only a day of relief.  She is here today for further assessment.  She does have a history of diabetes with elevated A1c.    Surgical History:   None  PMH/PSH/Family History/Social History/Meds/Allergies:    Past Medical History:  Diagnosis Date   Allergy    Anemia    Arthritis    Asthma    60yr ago   Chronic deafness of both ears    left ear has 40% hearing-right ear has 60 % hearing   Colon polyps    Complication of anesthesia    Hard to wake up   Constipation    Diabetes mellitus without complication (HCC)    Heartburn    High cholesterol    Migraines    none in last 11 yrs   Obesity    Pelvic kidney    left   Pneumonia    Post partum depression    Urinary incontinence    Past Surgical History:  Procedure Laterality Date   CESAREAN SECTION     COLONOSCOPY WITH PROPOFOL N/A 03/24/2015   Procedure: COLONOSCOPY WITH PROPOFOL;  Surgeon: JJuanita Craver MD;  Location: [facility];  Service: Endoscopy;  Laterality: N/A;   HEMORRHOID BANDING  08/2017, 09/2017   KNEE ARTHROSCOPY WITH MEDIAL MENISECTOMY Left 04/17/2014   Procedure: LEFT KNEE ARTHROSCOPY WITH PARTIAL MEDIAL MENISCECTOMY AND CHONDROPLASTY;  Surgeon: JJohnn Hai MD;  Location: [facility];  Service: Orthopedics;  Laterality: Left;   PARATHYROIDECTOMY Left 05/24/2021   Procedure: LEFT  INFERIOR PARATHYROIDECTOMY, POSSIBLE NECK EXPLORATION;  Surgeon: GArmandina Gemma MD;  Location: [facility];  Service: General;  Laterality: Left;   TYMPANOSTOMY TUBE PLACEMENT Right    Social History   Socioeconomic History   Marital status: Single    Spouse name: Not on file   Number of children: 1   Years of education: college    Highest education level: Associate degree: academic program  Occupational History   Occupation: social services case worker    Employer: [employer] Tobacco Use   Smoking status: Never   Smokeless tobacco: Never  Vaping Use   Vaping Use: Never used  Substance and Sexual Activity   Alcohol use: Not Currently    Comment: rare   Drug use: No   Sexual activity: Not Currently    Partners: Male    Birth control/protection: Abstinence, I.U.D.    Comment: Mirena placed 05/2016  Other Topics Concern   Not on file  Social History Narrative   Single. Education: [employer]    Social Determinants of Health   Financial Resource Strain: Low Risk  (11/09/2018)  Overall Financial Resource Strain (CARDIA)    Difficulty of Paying Living Expenses: Not very hard  Food Insecurity: Unknown (11/09/2018)   Hunger Vital Sign    Worried About Running Out of Food in the Last Year: Patient refused    [place] in the Last Year: Patient refused  Transportation Needs: Unknown (11/09/2018)   PRAPARE - Hydrologist (Medical): Patient refused    Lack of Transportation (Non-Medical): Patient refused  Physical Activity: Unknown (11/09/2018)   Exercise Vital Sign    Days of Exercise per Week: Patient refused    Minutes of Exercise per Session: Patient refused  Stress: Unknown (11/09/2018)   [place]    Feeling of Stress : Patient refused  Social Connections: Unknown (11/09/2018)   Social Connection and Isolation Panel [NHANES]    Frequency of Communication with Friends and Family:  Patient refused    Frequency of Social Gatherings with Friends and Family: Patient refused    Attends Religious Services: Patient refused    Marine scientist or Organizations: Patient refused    Attends Music therapist: Patient refused    Marital Status: Patient refused   Family History  Problem Relation Age of Onset   Hypertension Mother    Arthritis Mother    Hypercalcemia Mother    Obesity Mother    Allergic rhinitis Mother    Asthma Mother    Hypertension Sister    Deep vein thrombosis Sister    Pulmonary embolism Sister    Asthma Daughter    Diabetes Maternal Grandmother    Heart failure Maternal Grandmother    Heart disease Maternal Grandmother    Hyperlipidemia Maternal Grandmother    Hypertension Maternal Grandmother    Hypertension Maternal Grandfather    Hyperlipidemia Maternal Grandfather    Breast cancer Paternal Aunt        diagnosed in her 65's   Breast cancer Paternal Grandmother    Allergies  Allergen Reactions   Linzess [Linaclotide] Other (See Comments)    Abdominal pain    Pravastatin Other (See Comments)    Body aches   Adhesive [Tape]     Pulls skin off   Atorvastatin     Muscle aches, stomach upset   Crestor [Rosuvastatin]     Stomach pain   Latex Itching   Ozempic (0.25 Or 0.5 Mg-Dose) [Semaglutide(0.25 Or 0.'5mg'$ -Dos)] Nausea And Vomiting    Stomach pain   Toradol [Ketorolac Tromethamine]     Joint pain and swelling   Tramadol Other (See Comments)    JOINT PAIN   Current Outpatient Medications  Medication Sig Dispense Refill   cetirizine (ZYRTEC) 10 MG tablet Take 2 tablets (20 mg total) by mouth 2 (two) times daily. 180 tablet 1   chlorhexidine (PERIDEX) 0.12 % solution Use as directed 15 mLs in the mouth or throat in the morning and at bedtime.     Cholecalciferol (VITAMIN D3) 50 MCG (2000 UT) TABS Take 2,000 Units by mouth in the morning.     Continuous Blood Gluc Receiver (FREESTYLE LIBRE 2 READER) DEVI UAD to check  sugars.  Dx NIDDM, E11.65 1 each 0   Continuous Blood Gluc Sensor (FREESTYLE LIBRE 2 SENSOR) MISC USE AS DIRECTED TO CHECK SUGARS DX E11.65, NIDDM 1 each 5   Emollient (CERAVE DAILY MOISTURIZING EX) Apply 1 application topically in the morning and at bedtime. Mixed with triamcinolone cream     Empagliflozin-metFORMIN  HCl ER (SYNJARDY XR) 12.12-998 MG TB24 Take 2 tablets by mouth daily. 180 tablet 3   glipiZIDE (GLUCOTROL) 5 MG tablet Take 0.5 tablets (2.5 mg total) by mouth daily before breakfast. 45 tablet 3   glucose blood (FREESTYLE TEST STRIPS) test strip Use as instructed 100 each 12   losartan (COZAAR) 25 MG tablet TAKE 1 TABLET BY MOUTH EVERY DAY 30 tablet 5   olopatadine (PATANOL) 0.1 % ophthalmic solution Place 1 drop into both eyes 2 (two) times daily. 5 mL 5   rosuvastatin (CRESTOR) 5 MG tablet TAKE 1 TABLET (5 MG TOTAL) BY MOUTH AS DIRECTED. 13 tablet 3   tirzepatide (MOUNJARO) 5 MG/0.5ML Pen Inject 5 mg into the skin once a week. 6 mL 1   triamcinolone ointment (KENALOG) 0.1 % Apply 1 Application topically 2 (two) times daily. 454 g 3   No current facility-administered medications for this visit.   No results found.  Review of Systems:   A ROS was performed including pertinent positives and negatives as documented in the HPI.  Physical Exam :   Constitutional: NAD and appears stated age Neurological: Alert and oriented Psych: Appropriate affect and cooperative There were no vitals taken for this visit.   Comprehensive Musculoskeletal Exam:    Inspection Right Left  Skin No atrophy or gross abnormalities appreciated No atrophy or gross abnormalities appreciated  Palpation    Tenderness None None  Crepitus None None  Range of Motion    Flexion (passive) 100 with pain in groin 120  Extension 30 30  IR 20 with pain 20  ER 30 30  Strength    Flexion  5/5 5/5  Extension 5/5 5/5  Special Tests    FABER Negative Negative  FADIR Negative Negative  ER Lag/Capsular  Insufficiency Negative Negative  Instability Negative Negative  Sacroiliac pain Negative  Negative   Instability    Generalized Laxity No No  Neurologic    sciatic, femoral, obturator nerves intact to light sensation  Vascular/Lymphatic    DP pulse 2+ 2+  Lumbar Exam    Patient has symmetric lumbar range of motion with negative pain referral to hip   Positive straight leg raise on the right  Imaging:   Xray (3 views right hip): Mild degenerative findings with evidence of a cam type impingement  MRI (right hip): Overall degenerative appearing findings with a slightly ossified right labrum  I personally reviewed and interpreted the radiographs.   Assessment:   61 y.o. female with right thigh and hip pain.  Today with her I discussed that given the fact that she only got essentially 1 day of relief from an ultrasound-guided femoral acetabular injection I am further inquisitive about her true pain generator.  I described that I typically would expect to see a minimum of 2 weeks of relief with ultrasound-guided injection if it is due to pain source.  Overall her MRI findings are consistent with mild degenerative changes of the hip although her exam the type of pain that I am able to recreate with a FADIR maneuver I am not able to match to what she describes as her pain that shoots down the hip and thigh.  Flat affect I believe that is entirely possible she does have a lumbar disc herniation that is causing her symptoms. To that affect I would like to obtain an MRI of her lumbar spine so that we can further assess this.  I will plan to see her back following this discuss results  Plan :    -Plan for MRI lumbar spine and follow-up to discuss results     I personally saw and evaluated the patient, and participated in the management and treatment plan.  Vanetta Mulders, MD Attending Physician, Orthopedic Surgery  This document was dictated using Dragon voice recognition software. A  reasonable attempt at proof reading has been made to minimize errors.

## 2022-03-14 ENCOUNTER — Ambulatory Visit
Admission: RE | Admit: 2022-03-14 | Discharge: 2022-03-14 | Disposition: A | Discharge status: Home / Self Care | Payer: 59 | Source: Ambulatory Visit | Attending: Orthopaedic Surgery | Admitting: Orthopaedic Surgery

## 2022-03-14 DIAGNOSIS — M5416 Radiculopathy, lumbar region: Secondary | ICD-10-CM

## 2022-03-16 ENCOUNTER — Encounter (INDEPENDENT_AMBULATORY_CARE_PROVIDER_SITE_OTHER): Payer: Self-pay

## 2022-03-25 ENCOUNTER — Ambulatory Visit: Payer: 59 | Admitting: Internal Medicine

## 2022-03-25 ENCOUNTER — Encounter: Payer: Self-pay | Admitting: Internal Medicine

## 2022-03-25 VITALS — BP 124/82 | HR 79 | Ht 62.0 in | Wt 246.0 lb

## 2022-03-25 DIAGNOSIS — E1165 Type 2 diabetes mellitus with hyperglycemia: Secondary | ICD-10-CM | POA: Diagnosis not present

## 2022-03-25 LAB — POCT GLYCOSYLATED HEMOGLOBIN (HGB A1C): Hemoglobin A1C: 6.7 % — AB (ref 4.0–5.6)

## 2022-03-25 LAB — POCT GLUCOSE (DEVICE FOR HOME USE): Glucose Fasting, POC: 128 mg/dL — AB (ref 70–99)

## 2022-03-25 MED ORDER — TIRZEPATIDE 5 MG/0.5ML ~~LOC~~ SOAJ
5.0000 mg | SUBCUTANEOUS | 3 refills | Status: DC
Start: 2022-03-25 — End: 2022-06-23

## 2022-03-25 NOTE — Patient Instructions (Addendum)
-   STOP Glipizide  - Continue  Synjardy 12.12-998 mg TWO tablets daily  - Continue Mounjaro 5 mg weekly      HOW TO TREAT LOW BLOOD SUGARS (Blood sugar LESS THAN 70 MG/DL) Please follow the RULE OF 15 for the treatment of hypoglycemia treatment (when your (blood sugars are less than 70 mg/dL)   STEP 1: Take 15 grams of carbohydrates when your blood sugar is low, which includes:  3-4 GLUCOSE TABS  OR 3-4 OZ OF JUICE OR REGULAR SODA OR ONE TUBE OF GLUCOSE GEL    STEP 2: RECHECK blood sugar in 15 MINUTES STEP 3: If your blood sugar is still low at the 15 minute recheck --> then, go back to STEP 1 and treat AGAIN with another 15 grams of carbohydrates. Stop Jardiance stop Jardiance start Synjardy 12 point 12-998, 2 tabs daily Synjardy 12.12-998 mg,

## 2022-03-25 NOTE — Progress Notes (Signed)
Name: Jill [name]  Age/ Sex: 61 y.o., female   MRN/ DOB: [id number], 1960-10-11     PCP: Binnie Rail, MD   Reason for Endocrinology Evaluation: Type 2 Diabetes Mellitus/ Hypercalcemia   Initial Endocrine Consultative Visit: 06/25/2020    PATIENT IDENTIFIER: Jill [name] is a 61 y.o. female with a past medical history of T2DM, HTN, Ashtma and Primary Hyperparathyroidism ( S/P parathyroidectomy 2022). The patient has followed with Endocrinology clinic since 06/25/2020 for consultative assistance with management of her diabetes.  DIABETIC HISTORY:  Jill [name] was diagnosed with DM > 30 yrs ago. Ozempic caused GI side effects. No reported intolerance to Trajenta .She was started on Glipizide and Jardiance in 08/2020 Her hemoglobin A1c has ranged from  6.7% in 2019 to 11.7% in 2021   Glipizide was stopped by PCP , we restarted at smaller dose in 12/2020   We stopped Kombiglyze and switch Jardiance to [place] by April 0962 Started Trulicity April 8366  Hyperparathyroid History  The patient was first diagnosed with hypercalcemia in 2015, with maximum serum calcium level of  level 11.2 mg/dL ( Non corrected)    Her 24- hr urinary Ca/Cr ratio of 0.012 with a 191 mg of 24-hr calcium (06/2020) DXA (10/06/2020) low bone density    She met surgical criteria  due to low GFR  She is S/P left inferior parathyroidectomy 05/24/2021  SUBJECTIVE:   During the last visit (11/10/2021): A1c 9.0 % , we started Synjardy, and Trulicity, continue glipizide  Today (03/25/2022): Jill [name]  She checks her blood sugars multiple  times daily, through the CGM. The patient has  had hypoglycemic episodes since the last clinic visit. She has no symptoms and question accuracy of freestyle libre.    She continues to follow-up with orthopedics for hip pain on the right  She has been noted with weight loss   HOME DIABETES REGIMEN:  - Glipizide 5 mg, Half a tablet daily -Synjardy 12.12-998 mg 2 tabs  daily -Mounjaro 5 mg weekly     Statin: yes ACE-I/ARB: yes    CONTINUOUS GLUCOSE MONITORING RECORD INTERPRETATION    Dates of Recording: 8/5-8/18/2023  Sensor description:freestyle libre  Results statistics:   CGM use % of time 81  Average and SD 100/28.1  Time in range 89  % Time Above 180 2  % Time above 250 0  % Time Below target 8   Glycemic patterns summary: Optimal BG's through the day and night  Hyperglycemic episodes  n/a  Hypoglycemic episodes occurred during the day   Overnight periods: trends down     DIABETIC COMPLICATIONS: Microvascular complications:  CKD III Denies: Neuropathy, retinopathy Last Eye Exam: Completed 03/2021  Macrovascular complications:   Denies: CAD, CVA, PVD   HISTORY:  Past Medical History:  Past Medical History:  Diagnosis Date   Allergy    Anemia    Arthritis    Asthma    34yr ago   Chronic deafness of both ears    left ear has 40% hearing-right ear has 60 % hearing   Colon polyps    Complication of anesthesia    Hard to wake up   Constipation    Diabetes mellitus without complication (HCC)    Heartburn    High cholesterol    Migraines    none in last 11 yrs   Obesity    Pelvic kidney    left   Pneumonia    Post partum depression    Urinary incontinence  Past Surgical History:  Past Surgical History:  Procedure Laterality Date   CESAREAN SECTION     COLONOSCOPY WITH PROPOFOL N/A 03/24/2015   Procedure: COLONOSCOPY WITH PROPOFOL;  Surgeon: Juanita Craver, MD;  [facility];  Service: Endoscopy;  Laterality: N/A;   HEMORRHOID BANDING  08/2017, 09/2017   KNEE ARTHROSCOPY WITH MEDIAL MENISECTOMY Left 04/17/2014   Procedure: LEFT KNEE ARTHROSCOPY WITH PARTIAL MEDIAL MENISCECTOMY AND CHONDROPLASTY;  Surgeon: Johnn Hai, MD;  Location: [facility];  Service: Orthopedics;  Laterality: Left;   PARATHYROIDECTOMY Left 05/24/2021   Procedure: LEFT INFERIOR PARATHYROIDECTOMY, POSSIBLE NECK EXPLORATION;   Surgeon: Armandina Gemma, MD;  Location: [facility];  Service: General;  Laterality: Left;   TYMPANOSTOMY TUBE PLACEMENT Right    Social History:  reports that she has never smoked. She has never used smokeless tobacco. She reports that she does not currently use alcohol. She reports that she does not use drugs. Family History:  Family History  Problem Relation Age of Onset   Hypertension Mother    Arthritis Mother    Hypercalcemia Mother    Obesity Mother    Allergic rhinitis Mother    Asthma Mother    Hypertension Sister    Deep vein thrombosis Sister    Pulmonary embolism Sister    Asthma Daughter    Diabetes Maternal Grandmother    Heart failure Maternal Grandmother    Heart disease Maternal Grandmother    Hyperlipidemia Maternal Grandmother    Hypertension Maternal Grandmother    Hypertension Maternal Grandfather    Hyperlipidemia Maternal Grandfather    Breast cancer Paternal Aunt        diagnosed in her 93's   Breast cancer Paternal Grandmother      HOME MEDICATIONS: Allergies as of 03/25/2022       Reactions   Linzess [linaclotide] Other (See Comments)   Abdominal pain    Pravastatin Other (See Comments)   Body aches   Adhesive [tape]    Pulls skin off   Atorvastatin    Muscle aches, stomach upset   Crestor [rosuvastatin]    Stomach pain   Latex Itching   Ozempic (0.25 Or 0.5 Mg-dose) [semaglutide(0.25 Or 0.24m-dos)] Nausea And Vomiting   Stomach pain   Toradol [ketorolac Tromethamine]    Joint pain and swelling   Tramadol Other (See Comments)   JOINT PAIN        Medication List        Accurate as of March 25, 2022  7:53 AM. If you have any questions, ask your nurse or doctor.          STOP taking these medications    glipiZIDE 5 MG tablet Commonly known as: GLUCOTROL Stopped by: IDorita Sciara MD       TAKE these medications    CERAVE DAILY MOISTURIZING EX Apply 1 application topically in the morning and at bedtime. Mixed with  triamcinolone cream   cetirizine 10 MG tablet Commonly known as: ZYRTEC Take 2 tablets (20 mg total) by mouth 2 (two) times daily.   chlorhexidine 0.12 % solution Commonly known as: PERIDEX Use as directed 15 mLs in the mouth or throat in the morning and at bedtime.   FreeStyle Libre 2 Reader Devi UAD to check sugars.  Dx NIDDM, E11.65   FreeStyle Libre 2 Sensor Misc USE AS DIRECTED TO CHECK SUGARS DX E11.65, NIDDM   FREESTYLE TEST STRIPS test strip Generic drug: glucose blood Use as instructed   losartan 25 MG tablet Commonly  known as: COZAAR TAKE 1 TABLET BY MOUTH EVERY DAY   olopatadine 0.1 % ophthalmic solution Commonly known as: PATANOL Place 1 drop into both eyes 2 (two) times daily.   rosuvastatin 5 MG tablet Commonly known as: CRESTOR TAKE 1 TABLET (5 MG TOTAL) BY MOUTH AS DIRECTED.   Synjardy XR 12.12-998 MG Tb24 Generic drug: Empagliflozin-metFORMIN HCl ER Take 2 tablets by mouth daily.   tirzepatide 5 MG/0.5ML Pen Commonly known as: MOUNJARO Inject 5 mg into the skin once a week.   triamcinolone ointment 0.1 % Commonly known as: KENALOG Apply 1 Application topically 2 (two) times daily.   Vitamin D3 50 MCG (2000 UT) Tabs Take 2,000 Units by mouth in the morning.         OBJECTIVE:   Vital Signs: BP 124/82 (BP Location: Left Arm, Patient Position: Sitting, Cuff Size: Large)   Pulse 79   Ht 5' 2"  (1.575 m)   Wt 246 lb (111.6 kg)   SpO2 96%   BMI 44.99 kg/m   Wt Readings from Last 3 Encounters:  03/25/22 246 lb (111.6 kg)  02/22/22 248 lb (112.5 kg)  02/02/22 254 lb (115.2 kg)     Exam: General: Pt appears well and is in NAD  Neck: General: Supple without adenopathy. Thyroid: Thyroid size normal.  No goiter or nodules appreciated.   Lungs: Clear with good BS bilat with no rales, rhonchi, or wheezes  Heart: RRR with normal S1 and S2 and no gallops; no murmurs; no rub  Extremities: No pretibial edema.   Neuro: MS is good with  appropriate affect, pt is alert and Ox3     DM Foot Exam 11/10/2021  The skin of the feet is intact without sores or ulcerations. The pedal pulses are 2+ on right and 2+ on left. The sensation is intact to a screening 5.07, 10 gram monofilament bilaterally     DATA REVIEWED:  Lab Results  Component Value Date   HGBA1C 6.7 (A) 03/25/2022   HGBA1C 9.0 (A) 11/10/2021   HGBA1C 8.5 (H) 05/18/2021    Latest Reference Range & Units 07/08/21 08:13  Sodium 135 - 145 mEq/L 141  Potassium 3.5 - 5.1 mEq/L 4.6  Chloride 96 - 112 mEq/L 105  CO2 19 - 32 mEq/L 29  Glucose 70 - 99 mg/dL 171 (H)  BUN 6 - 23 mg/dL 13  Creatinine 0.40 - 1.20 mg/dL 1.10  Calcium 8.4 - 10.5 mg/dL 9.3  Albumin 3.5 - 5.2 g/dL 4.2  GFR >60.00 mL/min 55.09 (L)  Total CHOL/HDL Ratio  3  Cholesterol 0 - 200 mg/dL 151  HDL Cholesterol >39.00 mg/dL 43.80  LDL (calc) 0 - 99 mg/dL 95  NonHDL  107.50  Triglycerides 0.0 - 149.0 mg/dL 62.0  VLDL 0.0 - 40.0 mg/dL 12.4  VITD 30.00 - 100.00 ng/mL 34.22    Latest Reference Range & Units 07/08/21 08:13  PTH, Intact 16 - 77 pg/mL 59     ASSESSMENT / PLAN / RECOMMENDATIONS:   1) Type 2 Diabetes Mellitus, Optimally  controlled, With CKD III complications - Most recent A1c of 6.7 %. Goal A1c < 7.0 %.     - Praised the pt on improved glycemic control and weight loss  - She is intolerant to Ozempic  -  She has been noted with hypoglycemia on freestyle [employer], she confirms those with finger stick and the hypoglycemia is inaccurate   MEDICATIONS:   -Stop Glipizide  -Continue  Synjardy 12.12-998 mg two tabs daily  -  Continue Mounjaro 5 mg weekly    EDUCATION / INSTRUCTIONS: BG monitoring instructions: Patient is instructed to check her blood sugars 3  times a day, before meals . Call [place] [facility] if: BG persistently < 70 I reviewed the Rule of 15 for the treatment of hypoglycemia in detail with the patient. Literature supplied.  2) Diabetic  complications:  Eye: Does not have known diabetic retinopathy.  Neuro/ Feet: Does not have known diabetic peripheral neuropathy .  Renal: Patient does  have known baseline CKD. She   is not on an ACEI/ARB at present.      3) Dyslipidemia:   -She has reported intolerance to daily rosuvastatin and atorvastatin, she was started on Zetia through her PCP but this has been cost prohibitive and she was unable to pay $40 as she is already paying for the CGM  - We stopped Pravastatin and started weekly Rosuvastatin 12/2020 - Tolerating it well   Continue Rosuvastatin 5 mg weekly      F/U in 4 months     Signed electronically by: Mack Guise, MD  [facility] Endocrinology  [facility] [address] [place], [place] [address] Phone: [phone] FAX: [phone]   CC: Binnie Rail, MD [place] [address] Phone: [phone]  Fax: [phone]  Return to Endocrinology clinic as below: Future Appointments  Date Time Provider [place]  04/04/2022  9:30 AM Vanetta Mulders, MD [facility] [place]  06/23/2022 10:00 AM Binnie Rail, MD [facility] None  09/05/2022  2:45 PM Megan Salon, MD [facility]

## 2022-04-04 ENCOUNTER — Ambulatory Visit (INDEPENDENT_AMBULATORY_CARE_PROVIDER_SITE_OTHER): Payer: 59 | Admitting: Orthopaedic Surgery

## 2022-04-04 DIAGNOSIS — S76011A Strain of muscle, fascia and tendon of right hip, initial encounter: Secondary | ICD-10-CM | POA: Diagnosis not present

## 2022-04-04 DIAGNOSIS — M25551 Pain in right hip: Secondary | ICD-10-CM

## 2022-04-04 MED ORDER — LIDOCAINE HCL 1 % IJ SOLN
4.0000 mL | INTRAMUSCULAR | Status: AC | PRN
  Administered 2022-04-04: 4 mL

## 2022-04-04 MED ORDER — TRIAMCINOLONE ACETONIDE 40 MG/ML IJ SUSP
80.0000 mg | INTRAMUSCULAR | Status: AC | PRN
  Administered 2022-04-04: 80 mg via INTRA_ARTICULAR

## 2022-04-04 NOTE — Progress Notes (Signed)
Chief Complaint: Right thigh and hip pain     History of Present Illness:   04/04/2022: Today for MRI follow-up.  She continues to experiencing radiating pain down the left lateral aspect of the thigh.  She is here today for further assessment.  Jill [name] is a 61 y.o. female presents today with ongoing right hip and thigh pain as a referral from Dr. Georgina Snell.  She is here today for further assessment to see if she would be a possible candidate for hip arthroscopy.  Overall she states that she has had pain in the right hip area since May 2023.  She not have any known injury.  She does feel like the pain radiates.  She has not had any physical therapy.  She has had 2 injections in June and July into the femoral acetabular joint with the last injection providing only a day of relief.  She is here today for further assessment.  She does have a history of diabetes with elevated A1c.    Surgical History:   None  PMH/PSH/Family History/Social History/Meds/Allergies:    Past Medical History:  Diagnosis Date  . Allergy   . Anemia   . Arthritis   . Asthma    98yr ago  . Chronic deafness of both ears    left ear has 40% hearing-right ear has 60 % hearing  . Colon polyps   . Complication of anesthesia    Hard to wake up  . Constipation   . Diabetes mellitus without complication ([place]   . Heartburn   . High cholesterol   . Migraines    none in last 11 yrs  . Obesity   . Pelvic kidney    left  . Pneumonia   . Post partum depression   . Urinary incontinence    Past Surgical History:  Procedure Laterality Date  . CESAREAN SECTION    . COLONOSCOPY WITH PROPOFOL N/A 03/24/2015   Procedure: COLONOSCOPY WITH PROPOFOL;  Surgeon: JJuanita Craver MD;  Location: [facility];  Service: Endoscopy;  Laterality: N/A;  . HEMORRHOID BANDING  08/2017, 09/2017  . KNEE ARTHROSCOPY WITH MEDIAL MENISECTOMY Left 04/17/2014   Procedure: LEFT KNEE ARTHROSCOPY WITH  PARTIAL MEDIAL MENISCECTOMY AND CHONDROPLASTY;  Surgeon: JJohnn Hai MD;  Location: [facility];  Service: Orthopedics;  Laterality: Left;  . PARATHYROIDECTOMY Left 05/24/2021   Procedure: LEFT INFERIOR PARATHYROIDECTOMY, POSSIBLE NECK EXPLORATION;  Surgeon: GArmandina Gemma MD;  Location: [facility];  Service: General;  Laterality: Left;  . TYMPANOSTOMY TUBE PLACEMENT Right    Social History   Socioeconomic History  . Marital status: Single    Spouse name: Not on file  . Number of children: 1  . Years of education: college   . Highest education level: Associate degree: academic program  Occupational History  . Occupation: social services case worker    Employer: [employer] Tobacco Use  . Smoking status: Never  . Smokeless tobacco: Never  Vaping Use  . Vaping Use: Never used  Substance and Sexual Activity  . Alcohol use: Not Currently    Comment: rare  . Drug use: No  . Sexual activity: Not Currently    Partners: Male    Birth control/protection: Abstinence, I.U.D.    Comment: Mirena placed 05/2016  Other Topics Concern  . Not on  file  Social History Narrative   Single. Education: [employer].    Social Determinants of Health   Financial Resource Strain: Low Risk  (11/09/2018)   Overall Financial Resource Strain (CARDIA)   . Difficulty of Paying Living Expenses: Not very hard  Food Insecurity: Unknown (11/09/2018)   Hunger Vital Sign   . Worried About Charity fundraiser in the Last Year: Patient refused   . Ran Out of Food in the Last Year: Patient refused  Transportation Needs: Unknown (11/09/2018)   [place] - Transportation   . Lack of Transportation (Medical): Patient refused   . Lack of Transportation (Non-Medical): Patient refused  Physical Activity: Unknown (11/09/2018)   Exercise Vital Sign   . Days of Exercise per Week: Patient refused   . Minutes of Exercise per Session: Patient refused  Stress: Unknown (11/09/2018)   [place]   . Feeling of Stress : Patient refused  Social Connections: Unknown (11/09/2018)   Social Connection and Isolation Panel [NHANES]   . Frequency of Communication with Friends and Family: Patient refused   . Frequency of Social Gatherings with Friends and Family: Patient refused   . Attends Religious Services: Patient refused   . Active Member of Clubs or Organizations: Patient refused   . Attends Archivist Meetings: Patient refused   . Marital Status: Patient refused   Family History  Problem Relation Age of Onset  . Hypertension Mother   . Arthritis Mother   . Hypercalcemia Mother   . Obesity Mother   . Allergic rhinitis Mother   . Asthma Mother   . Hypertension Sister   . Deep vein thrombosis Sister   . Pulmonary embolism Sister   . Asthma Daughter   . Diabetes Maternal Grandmother   . Heart failure Maternal Grandmother   . Heart disease Maternal Grandmother   . Hyperlipidemia Maternal Grandmother   . Hypertension Maternal Grandmother   . Hypertension Maternal Grandfather   . Hyperlipidemia Maternal Grandfather   . Breast cancer Paternal Aunt        diagnosed in her 86's  . Breast cancer Paternal Grandmother    Allergies  Allergen Reactions  . Linzess [Linaclotide] Other (See Comments)    Abdominal pain   . Pravastatin Other (See Comments)    Body aches  . Adhesive [Tape]     Pulls skin off  . Atorvastatin     Muscle aches, stomach upset  . Crestor [Rosuvastatin]     Stomach pain  . Latex Itching  . Ozempic (0.25 Or 0.5 Mg-Dose) [Semaglutide(0.25 Or 0.'5mg'$ -Dos)] Nausea And Vomiting    Stomach pain  . Toradol [Ketorolac Tromethamine]     Joint pain and swelling  . Tramadol Other (See Comments)    JOINT PAIN   Current Outpatient Medications  Medication Sig Dispense Refill  . cetirizine (ZYRTEC) 10 MG tablet Take 2 tablets (20 mg total) by mouth 2 (two) times daily. 180 tablet 1  . chlorhexidine (PERIDEX) 0.12 %  solution Use as directed 15 mLs in the mouth or throat in the morning and at bedtime.    . Cholecalciferol (VITAMIN D3) 50 MCG (2000 UT) TABS Take 2,000 Units by mouth in the morning.    . Continuous Blood Gluc Receiver (FREESTYLE LIBRE 2 READER) DEVI UAD to check sugars.  Dx NIDDM, E11.65 1 each 0  . Continuous Blood Gluc Sensor (FREESTYLE LIBRE 2 SENSOR) MISC USE AS DIRECTED TO CHECK SUGARS DX E11.65, NIDDM 1  each 5  . Emollient (CERAVE DAILY MOISTURIZING EX) Apply 1 application topically in the morning and at bedtime. Mixed with triamcinolone cream    . Empagliflozin-metFORMIN HCl ER (SYNJARDY XR) 12.12-998 MG TB24 Take 2 tablets by mouth daily. 180 tablet 3  . glucose blood (FREESTYLE TEST STRIPS) test strip Use as instructed 100 each 12  . losartan (COZAAR) 25 MG tablet TAKE 1 TABLET BY MOUTH EVERY DAY 30 tablet 5  . olopatadine (PATANOL) 0.1 % ophthalmic solution Place 1 drop into both eyes 2 (two) times daily. 5 mL 5  . rosuvastatin (CRESTOR) 5 MG tablet TAKE 1 TABLET (5 MG TOTAL) BY MOUTH AS DIRECTED. 13 tablet 3  . tirzepatide (MOUNJARO) 5 MG/0.5ML Pen Inject 5 mg into the skin once a week. 6 mL 3  . triamcinolone ointment (KENALOG) 0.1 % Apply 1 Application topically 2 (two) times daily. 454 g 3   No current facility-administered medications for this visit.   No results found.  Review of Systems:   A ROS was performed including pertinent positives and negatives as documented in the HPI.  Physical Exam :   Constitutional: NAD and appears stated age Neurological: Alert and oriented Psych: Appropriate affect and cooperative There were no vitals taken for this visit.   Comprehensive Musculoskeletal Exam:    Inspection Right Left  Skin No atrophy or gross abnormalities appreciated No atrophy or gross abnormalities appreciated  Palpation    Tenderness Greater trochanter None  Crepitus None None  Range of Motion    Flexion (passive) 100 with pain in groin 120  Extension 30 30   IR 20 with pain 20  ER 30 30  Strength    Flexion  5/5 5/5  Extension 5/5 5/5  Special Tests    FABER Negative Negative  FADIR Negative Negative  ER Lag/Capsular Insufficiency Negative Negative  Instability Negative Negative  Sacroiliac pain Negative  Negative   Instability    Generalized Laxity No No  Neurologic    sciatic, femoral, obturator nerves intact to light sensation  Vascular/Lymphatic    DP pulse 2+ 2+  Lumbar Exam    Patient has symmetric lumbar range of motion with negative pain referral to hip   Positive Trendelenburg on right  Imaging:   Xray (3 views right hip): 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   Assessment:   61 y.o. female with right thigh and hip pain.  Her MRI lumbar spine was essentially normal and on further review of her MRI of the hip she does have evidence of gluteus medius tearing which I believe is responsible for her radiating pain.  At this time she states that she is getting some relief from her femoral acetabular injection and side effect I do believe she would benefit from an additional ultrasound-guided gluteus medius injection to hopefully get her remaining pain relief.  I would like her to begin physical therapy for strengthening of the right hip at this time as I believe that would help to strengthen the glutes muscles as well  Plan :    -Plan for physical therapy for strengthening of the right gluteus muscles and core    Procedure Note  Patient: Jill [name]  Date of Birth: 09/18/1960           MRN: [id number]             Visit Date: 04/04/2022  Procedures: Visit Diagnoses:  1. Strain of gluteus medius, right, initial encounter      Large Joint Inj: R greater trochanter on 04/04/2022 12:45 PM Indications: pain Details: 22 G 3.5 in needle, ultrasound-guided anterolateral approach  Arthrogram: No  Medications: 4 mL lidocaine 1 %; 80 mg triamcinolone acetonide 40 MG/ML Outcome: tolerated well, no immediate complications Procedure, treatment alternatives, risks and benefits explained, specific risks discussed. Consent was given by the patient. Immediately prior to procedure a time out was called to verify the correct patient, procedure, equipment, support staff and site/side marked as required. Patient was prepped and draped in the usual sterile fashion.          I personally saw and evaluated the patient, and participated in the management and treatment plan.  Vanetta Mulders, MD Attending Physician, Orthopedic Surgery  This document was dictated using Dragon voice recognition software. A reasonable attempt at proof reading has been made to minimize errors.

## 2022-04-15 ENCOUNTER — Ambulatory Visit: Payer: 59 | Admitting: Physical Therapy

## 2022-04-15 ENCOUNTER — Encounter: Payer: Self-pay | Admitting: Physical Therapy

## 2022-04-15 ENCOUNTER — Other Ambulatory Visit: Payer: Self-pay

## 2022-04-15 DIAGNOSIS — R262 Difficulty in walking, not elsewhere classified: Secondary | ICD-10-CM | POA: Diagnosis not present

## 2022-04-15 DIAGNOSIS — M6281 Muscle weakness (generalized): Secondary | ICD-10-CM | POA: Diagnosis not present

## 2022-04-15 DIAGNOSIS — M25551 Pain in right hip: Secondary | ICD-10-CM

## 2022-04-15 NOTE — Therapy (Signed)
OUTPATIENT PHYSICAL THERAPY LOWER EXTREMITY EVALUATION   Patient Name: Jill [name] MRN: [id number] DOB:01/17/1961, 61 y.o., female Today's Date: 04/15/2022   PT End of Session - 04/15/22 0832     Visit Number 1    Number of Visits 6    Date for PT Re-Evaluation 07/08/22    Authorization Type [employer]    PT Start Time 0803    PT Stop Time 0835    PT Time Calculation (min) 32 min    Activity Tolerance Patient tolerated treatment well    Behavior During Therapy WFL for tasks assessed/performed             Past Medical History:  Diagnosis Date   Allergy    Anemia    Arthritis    Asthma    91yr ago   Chronic deafness of both ears    left ear has 40% hearing-right ear has 60 % hearing   Colon polyps    Complication of anesthesia    Hard to wake up   Constipation    Diabetes mellitus without complication (HCC)    Heartburn    High cholesterol    Migraines    none in last 11 yrs   Obesity    Pelvic kidney    left   Pneumonia    Post partum depression    Urinary incontinence    Past Surgical History:  Procedure Laterality Date   CESAREAN SECTION     COLONOSCOPY WITH PROPOFOL N/A 03/24/2015   Procedure: COLONOSCOPY WITH PROPOFOL;  Surgeon: JJuanita Craver MD;  [facility];  Service: Endoscopy;  Laterality: N/A;   HEMORRHOID BANDING  08/2017, 09/2017   KNEE ARTHROSCOPY WITH MEDIAL MENISECTOMY Left 04/17/2014   Procedure: LEFT KNEE ARTHROSCOPY WITH PARTIAL MEDIAL MENISCECTOMY AND CHONDROPLASTY;  Surgeon: JJohnn Hai MD;  Location: [facility];  Service: Orthopedics;  Laterality: Left;   PARATHYROIDECTOMY Left 05/24/2021   Procedure: LEFT INFERIOR PARATHYROIDECTOMY, POSSIBLE NECK EXPLORATION;  Surgeon: GArmandina Gemma MD;  Location: [facility];  Service: General;  Laterality: Left;   TYMPANOSTOMY TUBE PLACEMENT Right    Patient Active Problem List   Diagnosis Date Noted   Chronic right hip pain 02/15/2022   Multiple thyroid nodules 05/23/2021   History of primary  hyperparathyroidism s/p surgery 09/30/2020   Arthralgia 08/18/2020   Hepatic steatosis 08/18/2020   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   Morbid obesity with BMI of 45.0-49.9, adult ([place] 08/18/2020   Pruritus 04/12/2020   Hypertension 01/13/2020   Hyperlipidemia 01/13/2020   COVID-19 virus infection 09/13/2019   Vitamin D deficiency 04/08/2019   Hemorrhoids 05/29/2017   Fibroids, intramural 05/03/2016   Pelvic kidney    Diabetes mellitus ([place]    OBSTRUCTIVE SLEEP APNEA 05/19/2009   MOTION SICKNESS 03/12/2007   ASTHMA 01/31/2007    PCP: BBinnie Rail MD  REFERRING PROVIDER: BVanetta Mulders MD  REFERRING DIAG: S76.011A (ICD-10-CM) - Strain of gluteus medius, right, initial encounter  THERAPY DIAG:  Pain in right hip  Muscle weakness (generalized)  Difficulty in walking, not elsewhere classified  Rationale for Evaluation and Treatment Rehabilitation  ONSET DATE: pain since may 2023  SUBJECTIVE:   SUBJECTIVE STATEMENT: experiencing radiating pain down the left lateral aspect of the thigh since may 2023. She had MRI which showed glute tear. She has had hip injections and feels much better after most recent one.   PERTINENT HISTORY: PMH:OA,DM,obesity,Lt knee scope with medial menisectomy 2015  PAIN:  Are you having pain? Yes: NPRS scale: 0-2 currently/10 Pain  location: Rt glute and refers halfway down her thigh.  Pain description: sharp and shooting or dull ache Aggravating factors: prolonged activity or sitting  Relieving factors: extending her leg  PRECAUTIONS: None  WEIGHT BEARING RESTRICTIONS No  FALLS:  Has patient fallen in last 6 months? No  OCCUPATION: social services, sitting work  PLOF: Independent  PATIENT GOALS : reduce pain, improve strength, get more active   OBJECTIVE:   DIAGNOSTIC FINDINGS:  MRI (right hip): Overall degenerative appearing findings with a slightly ossified right labrum as well as nearly full-thickness tearing of the  gluteus medius and minimus   MRI lumbar spine: Normal  PATIENT SURVEYS:  FOTO 59% at eval, goal is 65%  COGNITION:  Overall cognitive status: Within functional limits for tasks assessed     SENSATION: WFL  EDEMA:    MUSCLE LENGTH: Hamstrings: Right to 75 deg Glutes: very tight on Rt   POSTURE:   PALPATION: TTP in glutes on Rt side  LOWER EXTREMITY/Lumbar ROM:  PROM Right eval Left eval  Hip flexion 95   Hip extension    Hip abduction 20   Hip adduction    Hip internal rotation 5   Hip external rotation 25   Knee flexion WFL   Knee extension WFL       Lumbar flexion 75%   Lumbar extension 50%            (Blank rows = not tested)  LOWER EXTREMITY MMT:  MMT Right eval Left eval  Hip flexion 4   Hip extension 3   Hip abduction 3   Hip adduction    Hip internal rotation    Hip external rotation    Knee flexion 5   Knee extension 4   Ankle dorsiflexion    Ankle plantarflexion    Ankle inversion    Ankle eversion     (Blank rows = not tested)  LOWER EXTREMITY SPECIAL TESTS:    FUNCTIONAL TESTS:    GAIT: eval Comments: WFL but can be antalgic at times.    TODAY'S TREATMENT: 04/15/22 HEP creation, review and demo   PATIENT EDUCATION:  Education details: HEP, PT plan of care, general gym activity recommendations Person educated: Patient Education method: Explanation, Demonstration, Verbal cues, and Handouts Education comprehension: verbalized understanding and needs further education   HOME EXERCISE PROGRAM: Access Code: [email or web address] URL: [email or web address] Date: 04/15/2022 Prepared by: Elsie Ra  Exercises - Supine Gluteus Stretch  - 2 x daily - 6 x weekly - 1 sets - 3 reps - 20 sec hold - Hooklying Isometric Clamshell  - 2 x daily - 6 x weekly - 1 sets - 15-20 reps - Seated Hamstring Stretch  - 2 x daily - 6 x weekly - 1 sets - 3 reps - 20 hold - Standing Hip Flexion with Resistance Loop  - 2 x daily - 6 x  weekly - 1 sets - 15-20 reps - Standing Hip Abduction with Resistance at Ankles and Counter Support  - 2 x daily - 6 x weekly - 1 sets - 15-20 reps - Standing Hip Extension with Resistance at Ankles and Counter Support  - 2 x daily - 6 x weekly - 1 sets - 15-20 reps   ASSESSMENT:  CLINICAL IMPRESSION: Patient referred to PT for Rt glute pain with tear in glute medius and minimus noted on MRI (see report above). Patient will benefit from skilled PT to address below impairments, limitations and improve overall function. At this time she would  like to trial HEP and follow up in 2 weeks with PT.  OBJECTIVE IMPAIRMENTS: decreased activity tolerance, difficulty walking, decreased balance, decreased endurance, decreased mobility, decreased ROM, decreased strength, impaired flexibility, impaired LE use, postural dysfunction, and pain.  ACTIVITY LIMITATIONS: bending, lifting, carry, locomotion, cleaning, community activity, driving, and or occupation  PERSONAL FACTORS: PMH:OA,DM,obesity,Lt knee scope with medial menisectomy 2015 are also affecting patient's functional outcome.  REHAB POTENTIAL: Good  CLINICAL DECISION MAKING: Stable/uncomplicated  EVALUATION COMPLEXITY: Low    GOALS: Short term PT Goals Target date: 05/13/2022 Pt will be I and compliant with HEP. Baseline:  Goal status: New Pt will decrease pain by 25% overall Baseline: Goal status: New  Long term PT goals Target date: 07/08/2022 Pt will improve lumbar and Rt hip ROM to [facility] to improve functional mobility Baseline: Goal status: New Pt will improve  hip/knee strength to at least 4+/5 MMT to improve functional strength Baseline: Goal status: New Pt will improve FOTO to at least 65% functional to show improved function Baseline: Goal status: New Pt will reduce pain to overall less than 2-3/10 with usual activity and work activity. Baseline:   PLAN: PT FREQUENCY: 1times per 1-2 weeks  PT DURATION: 12 weeks  PLANNED  INTERVENTIONS (unless contraindicated): aquatic PT, Canalith repositioning, cryotherapy, Electrical stimulation, Iontophoresis with 4 mg/ml dexamethasome, Moist heat, traction, Ultrasound, gait training, Therapeutic exercise, balance training, neuromuscular re-education, patient/family education, prosthetic training, manual techniques, passive ROM, dry needling, taping, vasopnuematic device, vestibular, spinal manipulations, joint manipulations  PLAN FOR NEXT SESSION: review and update HEP.   Debbe Odea, PT,DPT 04/15/2022, 8:36 AM

## 2022-04-28 NOTE — Therapy (Signed)
OUTPATIENT PHYSICAL THERAPY TREATMENT NOTE   Patient Name: Jill [name] MRN: [id number] DOB:26-Apr-1961, 61 y.o., female Today's Date: 04/29/2022  END OF SESSION:   PT End of Session - 04/29/22 0839     Visit Number 2    Number of Visits 6    Date for PT Re-Evaluation 07/08/22    Authorization Type U[employer]    PT Start Time 0845    PT Stop Time 0928    PT Time Calculation (min) 43 min    Activity Tolerance Patient tolerated treatment well    Behavior During Therapy WFL for tasks assessed/performed             Past Medical History:  Diagnosis Date   Allergy    Anemia    Arthritis    Asthma    53yr ago   Chronic deafness of both ears    left ear has 40% hearing-right ear has 60 % hearing   Colon polyps    Complication of anesthesia    Hard to wake up   Constipation    Diabetes mellitus without complication (HCC)    Heartburn    High cholesterol    Migraines    none in last 11 yrs   Obesity    Pelvic kidney    left   Pneumonia    Post partum depression    Urinary incontinence    Past Surgical History:  Procedure Laterality Date   CESAREAN SECTION     COLONOSCOPY WITH PROPOFOL N/A 03/24/2015   Procedure: COLONOSCOPY WITH PROPOFOL;  Surgeon: JJuanita Craver MD;  Location: [facility];  Service: Endoscopy;  Laterality: N/A;   HEMORRHOID BANDING  08/2017, 09/2017   KNEE ARTHROSCOPY WITH MEDIAL MENISECTOMY Left 04/17/2014   Procedure: LEFT KNEE ARTHROSCOPY WITH PARTIAL MEDIAL MENISCECTOMY AND CHONDROPLASTY;  Surgeon: JJohnn Hai MD;  Location: [facility];  Service: Orthopedics;  Laterality: Left;   PARATHYROIDECTOMY Left 05/24/2021   Procedure: LEFT INFERIOR PARATHYROIDECTOMY, POSSIBLE NECK EXPLORATION;  Surgeon: GArmandina Gemma MD;  Location: [facility];  Service: General;  Laterality: Left;   TYMPANOSTOMY TUBE PLACEMENT Right    Patient Active Problem List   Diagnosis Date Noted   Chronic right hip pain 02/15/2022   Multiple thyroid nodules 05/23/2021   History of  primary hyperparathyroidism s/p surgery 09/30/2020   Arthralgia 08/18/2020   Hepatic steatosis 08/18/2020   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   Morbid obesity with BMI of 45.0-49.9, adult ([place] 08/18/2020   Pruritus 04/12/2020   Hypertension 01/13/2020   Hyperlipidemia 01/13/2020   COVID-19 virus infection 09/13/2019   Vitamin D deficiency 04/08/2019   Hemorrhoids 05/29/2017   Fibroids, intramural 05/03/2016   Pelvic kidney    Diabetes mellitus ([place]    OBSTRUCTIVE SLEEP APNEA 05/19/2009   MOTION SICKNESS 03/12/2007   ASTHMA 01/31/2007   PCP: BBinnie Rail MD   REFERRING PROVIDER: BVanetta Mulders MD  REFERRING DIAG: S76.011A (ICD-10-CM) - Strain of gluteus medius, right, initial encounter  THERAPY DIAG:  Pain in right hip  Muscle weakness (generalized)  Difficulty in walking, not elsewhere classified     Rationale for Evaluation and Treatment Rehabilitation   ONSET DATE: pain since may 2023   SUBJECTIVE:    SUBJECTIVE STATEMENT: Pt reports significant pain since eval. She states that the pain is on the side of her thigh and not in the groin anymore.   Eval: experiencing radiating pain down the left lateral aspect of the thigh since may 2023. She had MRI which showed glute tear.  She has had hip injections and feels much better after most recent one.    PERTINENT HISTORY: PMH:OA, DM, obesity, Lt knee scope with medial menisectomy 2015   PAIN:  Are you having pain? Yes: NPRS scale: 0-2 currently/10 Pain location: Rt glute and refers halfway down her thigh.  Pain description: sharp and shooting or dull ache Aggravating factors: prolonged activity or sitting  Relieving factors: extending her leg   PRECAUTIONS: None   WEIGHT BEARING RESTRICTIONS No   FALLS:  Has patient fallen in last 6 months? No   OCCUPATION: social services, sitting work   PLOF: Independent   PATIENT GOALS : reduce pain, improve strength, get more active     OBJECTIVE:    DIAGNOSTIC  FINDINGS:  MRI (right hip): Overall degenerative appearing findings with a slightly ossified right labrum as well as nearly full-thickness tearing of the gluteus medius and minimus   MRI lumbar spine: Normal   PATIENT SURVEYS:  FOTO 59% at eval, goal is 65%   COGNITION:           Overall cognitive status: Within functional limits for tasks assessed                          SENSATION: WFL     MUSCLE LENGTH: Hamstrings: Right to 75 deg Glutes: very tight on Rt     POSTURE:    PALPATION: TTP in glutes on Rt side   LOWER EXTREMITY/Lumbar ROM:   PROM Right eval Left eval  Hip flexion 95    Hip extension      Hip abduction 20    Hip adduction      Hip internal rotation 5    Hip external rotation 25    Knee flexion WFL    Knee extension WFL           Lumbar flexion 75%    Lumbar extension 50%                   (Blank rows = not tested)   LOWER EXTREMITY MMT:   MMT Right eval Left eval  Hip flexion 4    Hip extension 3    Hip abduction 3    Hip adduction      Hip internal rotation      Hip external rotation      Knee flexion 5    Knee extension 4    Ankle dorsiflexion      Ankle plantarflexion      Ankle inversion      Ankle eversion       (Blank rows = not tested)  GAIT: eval Comments: WFL but can be antalgic at times.   TODAY'S TREATMENT: 04/29/2022:  Recumbent bike for 5 min, level 1 while taking subjective.  Heel slides with strap x15 Quad sets with towel under knee x 20 with 5 sec hold.  Isometric hip abduction with gait belt, 5 sec hols x 20  Glute sets supine, 5 sec squeeze x 20   Ice to hip prior to treatment to see if symptoms improved. Pt reports less radiating down leg, and isolation  of sharp pain to lateral hip with movement.    04/15/22 HEP creation, review and demo     PATIENT EDUCATION:  Education details: HEP, PT plan of care, general gym activity recommendations Person educated: Patient Education method: Explanation,  Demonstration, Verbal cues, and Handouts Education comprehension: verbalized understanding and needs further education  HOME EXERCISE PROGRAM: Access Code: [email or web address] URL: [email or web address] Date: 04/15/2022 Prepared by: Elsie Ra   Exercises - Supine Gluteus Stretch  - 2 x daily - 6 x weekly - 1 sets - 3 reps - 20 sec hold - Hooklying Isometric Clamshell  - 2 x daily - 6 x weekly - 1 sets - 15-20 reps - Seated Hamstring Stretch  - 2 x daily - 6 x weekly - 1 sets - 3 reps - 20 hold - Standing Hip Flexion with Resistance Loop  - 2 x daily - 6 x weekly - 1 sets - 15-20 reps - Standing Hip Abduction with Resistance at Ankles and Counter Support  - 2 x daily - 6 x weekly - 1 sets - 15-20 reps - Standing Hip Extension with Resistance at Ankles and Counter Support  - 2 x daily - 6 x weekly - 1 sets - 15-20 reps     ASSESSMENT:   CLINICAL IMPRESSION: Patient reports to first follow up appt with reports of increased pain in her R hip. She was unable to tolerate any active movement of her hip today. Session with focus on AAROM, and muscle activation within tolerable ranges. Educated pt on importance of movement to alleviate stiffness and pain present in SI joint due to known arthritis. Encouraged pt to perform isometric exercises at home until pain subsides prior to continuing on with active HEP. Pt will continue to benefit from skilled PT to address continued deficits.     OBJECTIVE IMPAIRMENTS: decreased activity tolerance, difficulty walking, decreased balance, decreased endurance, decreased mobility, decreased ROM, decreased strength, impaired flexibility, impaired LE use, postural dysfunction, and pain.   ACTIVITY LIMITATIONS: bending, lifting, carry, locomotion, cleaning, community activity, driving, and or occupation   PERSONAL FACTORS: PMH:OA,DM,obesity,Lt knee scope with medial menisectomy 2015 are also affecting patient's functional outcome.   REHAB POTENTIAL:  Good   CLINICAL DECISION MAKING: Stable/uncomplicated   EVALUATION COMPLEXITY: Low       GOALS: Short term PT Goals Target date: 05/13/2022 Pt will be I and compliant with HEP. Baseline:  Goal status: New Pt will decrease pain by 25% overall Baseline: Goal status: New   Long term PT goals Target date: 07/08/2022 Pt will improve lumbar and Rt hip ROM to [facility] to improve functional mobility Baseline: Goal status: New Pt will improve  hip/knee strength to at least 4+/5 MMT to improve functional strength Baseline: Goal status: New Pt will improve FOTO to at least 65% functional to show improved function Baseline: Goal status: New Pt will reduce pain to overall less than 2-3/10 with usual activity and work activity. Baseline:     PLAN: PT FREQUENCY: 1times per 1-2 weeks   PT DURATION: 12 weeks   PLANNED INTERVENTIONS (unless contraindicated): aquatic PT, Canalith repositioning, cryotherapy, Electrical stimulation, Iontophoresis with 4 mg/ml dexamethasome, Moist heat, traction, Ultrasound, gait training, Therapeutic exercise, balance training, neuromuscular re-education, patient/family education, prosthetic training, manual techniques, passive ROM, dry needling, taping, vasopnuematic device, vestibular, spinal manipulations, joint manipulations   PLAN FOR NEXT SESSION: review and update HEP.    Lynden Ang, PT 04/29/2022, 9:28 AM

## 2022-04-29 ENCOUNTER — Ambulatory Visit: Payer: 59 | Admitting: Physical Therapy

## 2022-04-29 ENCOUNTER — Encounter: Payer: Self-pay | Admitting: Physical Therapy

## 2022-04-29 DIAGNOSIS — R262 Difficulty in walking, not elsewhere classified: Secondary | ICD-10-CM | POA: Diagnosis not present

## 2022-04-29 DIAGNOSIS — M25551 Pain in right hip: Secondary | ICD-10-CM

## 2022-04-29 DIAGNOSIS — M6281 Muscle weakness (generalized): Secondary | ICD-10-CM | POA: Diagnosis not present

## 2022-04-30 ENCOUNTER — Other Ambulatory Visit: Payer: Self-pay | Admitting: Allergy & Immunology

## 2022-05-13 ENCOUNTER — Ambulatory Visit: Payer: 59 | Admitting: Physical Therapy

## 2022-05-13 ENCOUNTER — Encounter: Payer: Self-pay | Admitting: Physical Therapy

## 2022-05-13 DIAGNOSIS — R262 Difficulty in walking, not elsewhere classified: Secondary | ICD-10-CM

## 2022-05-13 DIAGNOSIS — M25551 Pain in right hip: Secondary | ICD-10-CM

## 2022-05-13 DIAGNOSIS — M6281 Muscle weakness (generalized): Secondary | ICD-10-CM | POA: Diagnosis not present

## 2022-05-13 NOTE — Therapy (Addendum)
OUTPATIENT PHYSICAL THERAPY TREATMENT NOTE/Discharge addendum PHYSICAL THERAPY DISCHARGE SUMMARY  Visits from Start of Care: 3  Current functional level related to goals / functional outcomes: See below   Remaining deficits: See below   Education / Equipment: HEP  Plan:  Patient goals were mostly met. Patient is being discharged due to not returning since last visit. She messaged PT stating she was going to cancel due to feeling better. Elsie Ra, PT, DPT 06/14/22 8:42 AM       Patient Name: Jill [name] MRN: [id number] DOB:May 27, 1961, 61 y.o., female Today's Date: 05/13/2022  END OF SESSION:   PT End of Session - 05/13/22 0955     Visit Number 3    Number of Visits 6    Date for PT Re-Evaluation 07/08/22    Authorization Type UHC    PT Start Time 0845    PT Stop Time 0925    PT Time Calculation (min) 40 min    Activity Tolerance Patient tolerated treatment well    Behavior During Therapy WFL for tasks assessed/performed             Past Medical History:  Diagnosis Date   Allergy    Anemia    Arthritis    Asthma    28yr ago   Chronic deafness of both ears    left ear has 40% hearing-right ear has 60 % hearing   Colon polyps    Complication of anesthesia    Hard to wake up   Constipation    Diabetes mellitus without complication (HCC)    Heartburn    High cholesterol    Migraines    none in last 11 yrs   Obesity    Pelvic kidney    left   Pneumonia    Post partum depression    Urinary incontinence    Past Surgical History:  Procedure Laterality Date   CESAREAN SECTION     COLONOSCOPY WITH PROPOFOL N/A 03/24/2015   Procedure: COLONOSCOPY WITH PROPOFOL;  Surgeon: JJuanita Craver MD;  [facility];  Service: Endoscopy;  Laterality: N/A;   HEMORRHOID BANDING  08/2017, 09/2017   KNEE ARTHROSCOPY WITH MEDIAL MENISECTOMY Left 04/17/2014   Procedure: LEFT KNEE ARTHROSCOPY WITH PARTIAL MEDIAL MENISCECTOMY AND CHONDROPLASTY;  Surgeon:  JJohnn Hai MD;  [facility];  Service: Orthopedics;  Laterality: Left;   PARATHYROIDECTOMY Left 05/24/2021   Procedure: LEFT INFERIOR PARATHYROIDECTOMY, POSSIBLE NECK EXPLORATION;  Surgeon: GArmandina Gemma MD;  Location: [facility];  Service: General;  Laterality: Left;   TYMPANOSTOMY TUBE PLACEMENT Right    Patient Active Problem List   Diagnosis Date Noted   Chronic right hip pain 02/15/2022   Multiple thyroid nodules 05/23/2021   History of primary hyperparathyroidism s/p surgery 09/30/2020   Arthralgia 08/18/2020   Hepatic steatosis 08/18/2020   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   Morbid obesity with BMI of 45.0-49.9, [place] 08/18/2020   Pruritus 04/12/2020   Hypertension 01/13/2020   Hyperlipidemia 01/13/2020   COVID-19 virus infection 09/13/2019   Vitamin D deficiency 04/08/2019   Hemorrhoids 05/29/2017   Fibroids, intramural 05/03/2016   Pelvic kidney    Diabetes mellitus ([facility]    OBSTRUCTIVE SLEEP APNEA 05/19/2009   MOTION SICKNESS 03/12/2007   ASTHMA 01/31/2007   PCP: BBinnie Rail MD   REFERRING PROVIDER: BVanetta Mulders MD  REFERRING DIAG: S76.011A (ICD-10-CM) - Strain of gluteus medius, right, initial encounter  THERAPY DIAG:  Pain in right hip  Muscle weakness (generalized)  Difficulty  in walking, not elsewhere classified     Rationale for Evaluation and Treatment Rehabilitation   ONSET DATE: pain since may 2023   SUBJECTIVE:    SUBJECTIVE STATEMENT: She reports today the pain is much better and she has been going to the gym regularly  Eval: experiencing radiating pain down the left lateral aspect of the thigh since may 2023. She had MRI which showed glute tear. She has had hip injections and feels much better after most recent one.    PERTINENT HISTORY: PMH:OA, DM, obesity, Lt knee scope with medial menisectomy 2015   PAIN:  Are you having pain? Yes: NPRS scale: 0 currently can get at worse to 5/10 Pain location: Rt glute and refers  halfway down her thigh.  Pain description: sharp and shooting or dull ache Aggravating factors: prolonged activity or sitting  Relieving factors: extending her leg   PRECAUTIONS: None   WEIGHT BEARING RESTRICTIONS No   FALLS:  Has patient fallen in last 6 months? No   OCCUPATION: social services, sitting work   PLOF: Independent   PATIENT GOALS : reduce pain, improve strength, get more active     OBJECTIVE:    DIAGNOSTIC FINDINGS:  MRI (right hip): Overall degenerative appearing findings with a slightly ossified right labrum as well as nearly full-thickness tearing of the gluteus medius and minimus   MRI lumbar spine: Normal   PATIENT SURVEYS:  FOTO 59% at eval, goal is 65%   COGNITION:           Overall cognitive status: Within functional limits for tasks assessed                          SENSATION: WFL     MUSCLE LENGTH: Hamstrings: Right to 75 deg Glutes: very tight on Rt     POSTURE:    PALPATION: TTP in glutes on Rt side   LOWER EXTREMITY/Lumbar ROM:   PROM Right eval Left eval  Hip flexion 95    Hip extension      Hip abduction 20    Hip adduction      Hip internal rotation 5    Hip external rotation 25    Knee flexion WFL    Knee extension WFL           Lumbar flexion 75%    Lumbar extension 50%                   (Blank rows = not tested)   LOWER EXTREMITY MMT:   MMT Right eval Left eval  Hip flexion 4    Hip extension 3    Hip abduction 3    Hip adduction      Hip internal rotation      Hip external rotation      Knee flexion 5    Knee extension 4    Ankle dorsiflexion      Ankle plantarflexion      Ankle inversion      Ankle eversion       (Blank rows = not tested)  GAIT: eval Comments: WFL but can be antalgic at times.   TODAY'S TREATMENT: 05/13/22 -Nu step L5 X 10 min UE/LE -Seated hamstring stretch 3X30 sec on Left -Seated piriformis stretch AAROM with strap 10 sec X 5 on Lt -Seated clams black 2X15 (provided  her with band to take home hor HEP) -Standing hip hike X 10  -Standing hip abductions X10 -Standing  hip extensions X 10 -Education on HEP progressions and gym recommendations  04/29/2022:  Recumbent bike for 5 min, level 1 while taking subjective.  Heel slides with strap x15 Quad sets with towel under knee x 20 with 5 sec hold.  Isometric hip abduction with gait belt, 5 sec hols x 20  Glute sets supine, 5 sec squeeze x 20   Ice to hip prior to treatment to see if symptoms improved. Pt reports less radiating down leg, and isolation  of sharp pain to lateral hip with movement.    04/15/22 HEP creation, review and demo     PATIENT EDUCATION:  Education details: HEP, PT plan of care, general gym activity recommendations Person educated: Patient Education method: Explanation, Demonstration, Verbal cues, and Handouts Education comprehension: verbalized understanding and needs further education     HOME EXERCISE PROGRAM: Access Code: [email or web address] URL: [email or web address] Date: 05/13/2022 Prepared by: Elsie Ra  Exercises - Seated Piriformis Stretch with Trunk Bend  - 2 x daily - 6 x weekly - 1 sets - 2 reps - 30 hold - Seated Hamstring Stretch  - 2 x daily - 6 x weekly - 1 sets - 3 reps - 20 hold - Standing Hip Abduction with Resistance at Ankles and Counter Support  - 1 x daily - 6 x weekly - 1 sets - 10-15 reps - Standing Hip Extension with Resistance at Ankles and Counter Support  - 1 x daily - 6 x weekly - 1 sets - 10-15 reps - Standing Hip Hiking  - 1 x daily - 6 x weekly - 1 sets - 10 reps - Seated Hip Abduction  - 1 x daily - 6 x weekly - 1 sets - 15-20 reps     ASSESSMENT:   CLINICAL IMPRESSION: Patient is doing much better than last session. She has been exercising independently and not having as much pain. I did give her some additional HEP exercises and she will follow up in one month.     OBJECTIVE IMPAIRMENTS: decreased activity tolerance, difficulty  walking, decreased balance, decreased endurance, decreased mobility, decreased ROM, decreased strength, impaired flexibility, impaired LE use, postural dysfunction, and pain.   ACTIVITY LIMITATIONS: bending, lifting, carry, locomotion, cleaning, community activity, driving, and or occupation   PERSONAL FACTORS: PMH:OA,DM,obesity,Lt knee scope with medial menisectomy 2015 are also affecting patient's functional outcome.   REHAB POTENTIAL: Good   CLINICAL DECISION MAKING: Stable/uncomplicated   EVALUATION COMPLEXITY: Low       GOALS: Short term PT Goals Target date: 05/13/2022 Pt will be I and compliant with HEP. Baseline:  Goal status: New Pt will decrease pain by 25% overall Baseline: Goal status: New   Long term PT goals Target date: 07/08/2022 Pt will improve lumbar and Rt hip ROM to [facility] to improve functional mobility Baseline: Goal status: New Pt will improve  hip/knee strength to at least 4+/5 MMT to improve functional strength Baseline: Goal status: New Pt will improve FOTO to at least 65% functional to show improved function Baseline: Goal status: New Pt will reduce pain to overall less than 2-3/10 with usual activity and work activity. Baseline:     PLAN: PT FREQUENCY: 1times per 1-2 weeks   PT DURATION: 12 weeks   PLANNED INTERVENTIONS (unless contraindicated): aquatic PT, Canalith repositioning, cryotherapy, Electrical stimulation, Iontophoresis with 4 mg/ml dexamethasome, Moist heat, traction, Ultrasound, gait training, Therapeutic exercise, balance training, neuromuscular re-education, patient/family education, prosthetic training, manual techniques, passive ROM, dry needling, taping, vasopnuematic device, vestibular, spinal manipulations, joint  manipulations   PLAN FOR NEXT SESSION: review and update HEP. Assess for [place], PT,DPT 05/13/2022, 9:57 AM

## 2022-05-27 ENCOUNTER — Encounter: Payer: 59 | Admitting: Physical Therapy

## 2022-06-17 ENCOUNTER — Encounter: Payer: 59 | Admitting: Physical Therapy

## 2022-06-22 ENCOUNTER — Encounter: Payer: Self-pay | Admitting: Internal Medicine

## 2022-06-22 NOTE — Progress Notes (Unsigned)
Subjective:    Patient ID: Jill [name], female    DOB: [birth year], 61 y.o.   MRN: [id number]     HPI Minette is here for follow up of her chronic medical problems, including htn, DM, hld,   She is taking all of her medications as prescribed.   She is retiring in 5 days.  She joined a gym.  She has been going regularly but plans on going more often once retired.  Medications and allergies reviewed with patient and updated if appropriate.  Current Outpatient Medications on File Prior to Visit  Medication Sig Dispense Refill   cetirizine (ZYRTEC) 10 MG tablet TAKE 2 TABLETS BY MOUTH 2 TIMES DAILY. 120 tablet 2   chlorhexidine (PERIDEX) 0.12 % solution Use as directed 15 mLs in the mouth or throat in the morning and at bedtime.     Cholecalciferol (VITAMIN D3) 50 MCG (2000 UT) TABS Take 2,000 Units by mouth in the morning.     Continuous Blood Gluc Receiver (FREESTYLE LIBRE 2 READER) DEVI UAD to check sugars.  Dx NIDDM, E11.65 1 each 0   Continuous Blood Gluc Sensor (FREESTYLE LIBRE 2 SENSOR) MISC USE AS DIRECTED TO CHECK SUGARS DX E11.65, NIDDM 1 each 5   Emollient (CERAVE DAILY MOISTURIZING EX) Apply 1 application topically in the morning and at bedtime. Mixed with triamcinolone cream     Empagliflozin-metFORMIN HCl ER (SYNJARDY XR) 12.12-998 MG TB24 Take 2 tablets by mouth daily. 180 tablet 3   glucose blood (FREESTYLE TEST STRIPS) test strip Use as instructed 100 each 12   losartan (COZAAR) 25 MG tablet TAKE 1 TABLET BY MOUTH EVERY DAY 30 tablet 5   olopatadine (PATANOL) 0.1 % ophthalmic solution Place 1 drop into both eyes 2 (two) times daily. 5 mL 5   rosuvastatin (CRESTOR) 5 MG tablet TAKE 1 TABLET (5 MG TOTAL) BY MOUTH AS DIRECTED. 13 tablet 3   triamcinolone ointment (KENALOG) 0.1 % Apply 1 Application topically 2 (two) times daily. 454 g 3   No current facility-administered medications on file prior to visit.     Review of Systems  Constitutional:  Negative  for chills and fever.  Respiratory:  Negative for cough, shortness of breath and wheezing.   Cardiovascular:  Negative for chest pain, palpitations and leg swelling.  Neurological:  Negative for light-headedness and headaches.       Objective:   Vitals:   06/23/22 0956  BP: 130/78  Pulse: 74  Temp: 98.1 F (36.7 C)  SpO2: 99%   BP Readings from Last 3 Encounters:  06/23/22 130/78  03/25/22 124/82  02/22/22 (!) 188/92   Wt Readings from Last 3 Encounters:  06/23/22 234 lb (106.1 kg)  03/25/22 246 lb (111.6 kg)  02/22/22 248 lb (112.5 kg)   Body mass index is 42.8 kg/m.    Physical Exam Constitutional:      General: She is not in acute distress.    Appearance: Normal appearance.  HENT:     Head: Normocephalic and atraumatic.  Eyes:     Conjunctiva/sclera: Conjunctivae normal.  Cardiovascular:     Rate and Rhythm: Normal rate and regular rhythm.     Heart sounds: Normal heart sounds. No murmur heard. Pulmonary:     Effort: Pulmonary effort is normal. No respiratory distress.     Breath sounds: Normal breath sounds. No wheezing.  Musculoskeletal:     Cervical back: Neck supple.     Right lower leg: No edema.  Left lower leg: No edema.  Lymphadenopathy:     Cervical: No cervical adenopathy.  Skin:    General: Skin is warm and dry.     Findings: No rash.  Neurological:     Mental Status: She is alert. Mental status is at baseline.  Psychiatric:        Mood and Affect: Mood normal.        Behavior: Behavior normal.        Lab Results  Component Value Date   WBC 5.0 12/21/2021   HGB 14.0 12/21/2021   HCT 42.4 12/21/2021   PLT 216.0 12/21/2021   GLUCOSE 96 12/21/2021   CHOL 184 12/21/2021   TRIG 72.0 12/21/2021   HDL 43.30 12/21/2021   LDLDIRECT 125 (H) 09/10/2014   LDLCALC 126 (H) 12/21/2021   ALT 26 12/21/2021   AST 17 12/21/2021   NA 139 12/21/2021   K 4.2 12/21/2021   CL 104 12/21/2021   CREATININE 0.96 12/21/2021   BUN 13 12/21/2021    CO2 27 12/21/2021   TSH 1.29 12/15/2020   HGBA1C 6.7 (A) 03/25/2022   MICROALBUR <0.7 12/21/2021     Assessment & Plan:    See Problem List for Assessment and Plan of chronic medical problems.

## 2022-06-22 NOTE — Patient Instructions (Addendum)
      Blood work was ordered.   The lab is on the first floor.    Medications changes include :   increase mounjaro to 7.5 mg weekly    Return in about 6 months (around 12/22/2022) for Physical Exam.

## 2022-06-23 ENCOUNTER — Ambulatory Visit: Payer: 59 | Admitting: Internal Medicine

## 2022-06-23 VITALS — BP 130/78 | HR 74 | Temp 98.1°F | Ht 62.0 in | Wt 234.0 lb

## 2022-06-23 DIAGNOSIS — E782 Mixed hyperlipidemia: Secondary | ICD-10-CM | POA: Diagnosis not present

## 2022-06-23 DIAGNOSIS — I10 Essential (primary) hypertension: Secondary | ICD-10-CM

## 2022-06-23 DIAGNOSIS — E1165 Type 2 diabetes mellitus with hyperglycemia: Secondary | ICD-10-CM | POA: Diagnosis not present

## 2022-06-23 DIAGNOSIS — Z6841 Body Mass Index (BMI) 40.0 and over, adult: Secondary | ICD-10-CM

## 2022-06-23 LAB — COMPREHENSIVE METABOLIC PANEL
ALT: 18 U/L (ref 0–35)
AST: 13 U/L (ref 0–37)
Albumin: 4.2 g/dL (ref 3.5–5.2)
Alkaline Phosphatase: 75 U/L (ref 39–117)
BUN: 9 mg/dL (ref 6–23)
CO2: 29 mEq/L (ref 19–32)
Calcium: 9.4 mg/dL (ref 8.4–10.5)
Chloride: 105 mEq/L (ref 96–112)
Creatinine, Ser: 0.9 mg/dL (ref 0.40–1.20)
GFR: 69.62 mL/min (ref >60.00)
Glucose, Bld: 154 mg/dL — ABNORMAL HIGH (ref 70–99)
Potassium: 4.1 mEq/L (ref 3.5–5.1)
Sodium: 141 mEq/L (ref 135–145)
Total Bilirubin: 0.5 mg/dL (ref 0.2–1.2)
Total Protein: 7.7 g/dL (ref 6.0–8.3)

## 2022-06-23 LAB — LIPID PANEL
Cholesterol: 219 mg/dL — ABNORMAL HIGH (ref 0–200)
HDL: 50.3 mg/dL (ref >39.00)
LDL Cholesterol: 156 mg/dL — ABNORMAL HIGH (ref 0–99)
NonHDL: 168.82
Total CHOL/HDL Ratio: 4
Triglycerides: 66 mg/dL (ref 0.0–149.0)
VLDL: 13.2 mg/dL (ref 0.0–40.0)

## 2022-06-23 LAB — HEMOGLOBIN A1C: Hgb A1c MFr Bld: 6.7 % — ABNORMAL HIGH (ref 4.6–6.5)

## 2022-06-23 MED ORDER — TIRZEPATIDE 7.5 MG/0.5ML ~~LOC~~ SOAJ: 7.5000 mg | SUBCUTANEOUS | 1 refills | Status: DC

## 2022-06-23 MED ORDER — ROSUVASTATIN CALCIUM 5 MG PO TABS: 5.0000 mg | ORAL_TABLET | Freq: Every day | ORAL | 3 refills | Status: DC

## 2022-06-23 NOTE — Assessment & Plan Note (Signed)
Chronic BP well controlled Continue losartan 25 mg daily cmp

## 2022-06-23 NOTE — Assessment & Plan Note (Addendum)
Chronic   Lab Results  Component Value Date   HGBA1C 6.7 (A) 03/25/2022   Sugars well controlled Testing sugars 1 times a day Check A1c Continue mounjaro but increase to 7.5 mg weekly, synjardy xr 12.12-998 mg - 2 tabs daily Goal would be to get her off the metformin-she does not like taking large pill Stressed regular exercise, diabetic diet-I think this will improve with retirement.  Discussed the importance of meal planning and portion size.

## 2022-06-23 NOTE — Assessment & Plan Note (Addendum)
Chronic Working on weight loss Going to the gym 3 /week - once she retires she will go more She eats one meal a day - usually lunch, drinks a protein drink.  Making sure she is getting the protein in Will increase Mounjaro 7.5 mg weekly which will help with her sugars and hopefully will be able to get her off of one of her medications-she does not like taking the large pill and will help with weight loss

## 2022-06-23 NOTE — Assessment & Plan Note (Addendum)
Chronic Check lipid panel Tolerating Crestor well Continue crestor 5 mg  and increase to daily Regular exercise and healthy diet encouraged

## 2022-07-01 ENCOUNTER — Other Ambulatory Visit: Payer: Self-pay | Admitting: Internal Medicine

## 2022-07-24 ENCOUNTER — Other Ambulatory Visit: Payer: Self-pay | Admitting: Internal Medicine

## 2022-07-25 ENCOUNTER — Other Ambulatory Visit: Payer: Self-pay

## 2022-07-25 ENCOUNTER — Encounter: Payer: Self-pay | Admitting: Internal Medicine

## 2022-07-25 MED ORDER — ROSUVASTATIN CALCIUM 5 MG PO TABS: 5.0000 mg | ORAL_TABLET | Freq: Every day | ORAL | 3 refills | Status: DC

## 2022-09-05 ENCOUNTER — Other Ambulatory Visit (HOSPITAL_BASED_OUTPATIENT_CLINIC_OR_DEPARTMENT_OTHER): Payer: Self-pay

## 2022-09-05 ENCOUNTER — Encounter (HOSPITAL_BASED_OUTPATIENT_CLINIC_OR_DEPARTMENT_OTHER): Payer: Self-pay | Admitting: Obstetrics & Gynecology

## 2022-09-05 ENCOUNTER — Encounter: Payer: Self-pay | Admitting: Internal Medicine

## 2022-09-05 ENCOUNTER — Other Ambulatory Visit (HOSPITAL_COMMUNITY)
Admission: RE | Admit: 2022-09-05 | Discharge: 2022-09-05 | Disposition: A | Discharge status: Home / Self Care | Payer: 59 | Source: Ambulatory Visit | Attending: Obstetrics & Gynecology | Admitting: Obstetrics & Gynecology

## 2022-09-05 ENCOUNTER — Ambulatory Visit (HOSPITAL_BASED_OUTPATIENT_CLINIC_OR_DEPARTMENT_OTHER)
Admission: RE | Admit: 2022-09-05 | Discharge: 2022-09-05 | Disposition: A | Discharge status: Home / Self Care | Payer: 59 | Source: Ambulatory Visit | Attending: Internal Medicine | Admitting: Internal Medicine

## 2022-09-05 ENCOUNTER — Other Ambulatory Visit (HOSPITAL_BASED_OUTPATIENT_CLINIC_OR_DEPARTMENT_OTHER): Payer: Self-pay | Admitting: Internal Medicine

## 2022-09-05 ENCOUNTER — Ambulatory Visit (INDEPENDENT_AMBULATORY_CARE_PROVIDER_SITE_OTHER): Payer: 59 | Admitting: Obstetrics & Gynecology

## 2022-09-05 VITALS — BP 123/61 | HR 76 | Ht 62.0 in | Wt 245.2 lb

## 2022-09-05 DIAGNOSIS — Z6841 Body Mass Index (BMI) 40.0 and over, adult: Secondary | ICD-10-CM

## 2022-09-05 DIAGNOSIS — N898 Other specified noninflammatory disorders of vagina: Secondary | ICD-10-CM | POA: Diagnosis not present

## 2022-09-05 DIAGNOSIS — Z01419 Encounter for gynecological examination (general) (routine) without abnormal findings: Secondary | ICD-10-CM

## 2022-09-05 DIAGNOSIS — Q632 Ectopic kidney: Secondary | ICD-10-CM | POA: Diagnosis not present

## 2022-09-05 DIAGNOSIS — Z1231 Encounter for screening mammogram for malignant neoplasm of breast: Secondary | ICD-10-CM | POA: Diagnosis not present

## 2022-09-05 DIAGNOSIS — D251 Intramural leiomyoma of uterus: Secondary | ICD-10-CM

## 2022-09-05 DIAGNOSIS — K760 Fatty (change of) liver, not elsewhere classified: Secondary | ICD-10-CM

## 2022-09-05 MED ORDER — TERCONAZOLE 0.4 % VA CREA: 1.0000 | TOPICAL_CREAM | Freq: Every day | VAGINAL | 0 refills | Status: DC

## 2022-09-05 MED ORDER — PREVNAR 20 0.5 ML IM SUSY
PREFILLED_SYRINGE | INTRAMUSCULAR | 0 refills | Status: DC
  Filled 2022-09-05: qty 0.5, 1d supply, fill #0

## 2022-09-05 NOTE — Progress Notes (Signed)
62 y.o. G76P1001 Single Black or [place] American female here for annual exam.  Biggest issue for her the last year has been hip issues.  She had two muscular tears and these have finally healed.  She hasn't had any recent pain.  Did have MRI of pelvis in 02/2022 showing fibroids in uterus.  These were seen on ultrasound in 2017.    Denies vaginal bleeding.  Has IUD in place.  Not due for removal until October of next year.  Not having any issues with this.    No LMP recorded. (Menstrual status: IUD).          Sexually active: No.  The current method of family planning is IUD.    Smoker:  no  Health Maintenance: Pap:  08/30/2021 Negative History of abnormal Pap:  remote hx MMG:  09/05/2022--results are not finalized yet Colonoscopy:  03/24/2015 BMD:   10/06/2020 Screening Labs: done with Dr. Quay Burow and Dr. Kelton Pillar   reports that she has never smoked. She has never used smokeless tobacco. She reports that she does not currently use alcohol. She reports that she does not use drugs.  Past Medical History:  Diagnosis Date   Allergy    Anemia    Arthritis    Asthma    38yr ago   Chronic deafness of both ears    left ear has 40% hearing-right ear has 60 % hearing   Colon polyps    Complication of anesthesia    Hard to wake up   Constipation    Diabetes mellitus without complication (HCC)    Heartburn    High cholesterol    Migraines    none in last 11 yrs   Obesity    Pelvic kidney    left   Pneumonia    Post partum depression    Urinary incontinence     Past Surgical History:  Procedure Laterality Date   CESAREAN SECTION     COLONOSCOPY WITH PROPOFOL N/A 03/24/2015   Procedure: COLONOSCOPY WITH PROPOFOL;  Surgeon: JJuanita Craver MD;  Location: [facility];  Service: Endoscopy;  Laterality: N/A;   HEMORRHOID BANDING  08/2017, 09/2017   KNEE ARTHROSCOPY WITH MEDIAL MENISECTOMY Left 04/17/2014   Procedure: LEFT KNEE ARTHROSCOPY WITH PARTIAL MEDIAL MENISCECTOMY AND  CHONDROPLASTY;  Surgeon: JJohnn Hai MD;  Location: [facility];  Service: Orthopedics;  Laterality: Left;   PARATHYROIDECTOMY Left 05/24/2021   Procedure: LEFT INFERIOR PARATHYROIDECTOMY, POSSIBLE NECK EXPLORATION;  Surgeon: GArmandina Gemma MD;  Location: [facility];  Service: General;  Laterality: Left;   TYMPANOSTOMY TUBE PLACEMENT Right     Current Outpatient Medications  Medication Sig Dispense Refill   cetirizine (ZYRTEC) 10 MG tablet TAKE 2 TABLETS BY MOUTH 2 TIMES DAILY. 120 tablet 2   chlorhexidine (PERIDEX) 0.12 % solution Use as directed 15 mLs in the mouth or throat in the morning and at bedtime.     Cholecalciferol (VITAMIN D3) 50 MCG (2000 UT) TABS Take 2,000 Units by mouth in the morning.     Continuous Blood Gluc Receiver (FREESTYLE LIBRE 2 READER) DEVI UAD to check sugars.  Dx NIDDM, E11.65 1 each 0   Continuous Blood Gluc Sensor (FREESTYLE LIBRE 2 SENSOR) MISC USE AS DIRECTED TO CHECK SUGARS DX E11.65, NIDDM 1 each 2   Emollient (CERAVE DAILY MOISTURIZING EX) Apply 1 application topically in the morning and at bedtime. Mixed with triamcinolone cream     Empagliflozin-metFORMIN HCl ER (SYNJARDY XR) 12.12-998 MG TB24 Take 2 tablets by mouth  daily. 180 tablet 3   FREESTYLE PRECISION NEO TEST test strip USE AS INSTRUCTED 100 strip 12   losartan (COZAAR) 25 MG tablet TAKE 1 TABLET BY MOUTH EVERY DAY 30 tablet 5   olopatadine (PATANOL) 0.1 % ophthalmic solution Place 1 drop into both eyes 2 (two) times daily. 5 mL 5   rosuvastatin (CRESTOR) 5 MG tablet Take 1 tablet (5 mg total) by mouth daily. 30 tablet 3   tirzepatide (MOUNJARO) 7.5 MG/0.5ML Pen Inject 7.5 mg into the skin once a week. 6 mL 1   triamcinolone ointment (KENALOG) 0.1 % Apply 1 Application topically 2 (two) times daily. 454 g 3   No current facility-administered medications for this visit.    Family History  Problem Relation Age of Onset   Hypertension Mother    Arthritis Mother    Hypercalcemia Mother    Obesity  Mother    Allergic rhinitis Mother    Asthma Mother    Hypertension Sister    Deep vein thrombosis Sister    Pulmonary embolism Sister    Asthma Daughter    Diabetes Maternal Grandmother    Heart failure Maternal Grandmother    Heart disease Maternal Grandmother    Hyperlipidemia Maternal Grandmother    Hypertension Maternal Grandmother    Hypertension Maternal Grandfather    Hyperlipidemia Maternal Grandfather    Breast cancer Paternal Aunt        diagnosed in her 17's   Breast cancer Paternal Grandmother     ROS: Constitutional: negative Genitourinary:negative  Exam:   BP 123/61 (BP Location: Right Arm, Patient Position: Sitting, Cuff Size: Large)   Pulse 76   Ht '5\' 2"'$  (1.575 m) Comment: Reported  Wt 245 lb 3.2 oz (111.2 kg)   BMI 44.85 kg/m   Height: '5\' 2"'$  (157.5 cm) (Reported)  General appearance: alert, cooperative and appears stated age Head: Normocephalic, without obvious abnormality, atraumatic Neck: no adenopathy, supple, symmetrical, trachea midline and thyroid normal to inspection and palpation Lungs: clear to auscultation bilaterally Breasts: normal appearance, no masses or tenderness Heart: regular rate and rhythm Abdomen: soft, non-tender; bowel sounds normal; no masses,  no organomegaly Extremities: extremities normal, atraumatic, no cyanosis or edema Skin: Skin color, texture, turgor normal. No rashes or lesions Lymph nodes: Cervical, supraclavicular, and axillary nodes normal. No abnormal inguinal nodes palpated Neurologic: Grossly normal   Pelvic: External genitalia:  no lesions              Urethra:  normal appearing urethra with no masses, tenderness or lesions              Bartholins and Skenes: normal                 Vagina: normal appearing vagina with normal color and no discharge, no lesions              Cervix: no lesions              Pap taken: No. Bimanual Exam:  Uterus:  about 10 weeks size, mobile              Adnexa:  no masses                Rectovaginal: Confirms               Anus:  normal sphincter tone, no lesions  Chaperone, Octaviano Batty, CMA, was present for exam.  Assessment/Plan: 1. Well woman exam with routine gynecological exam - Pap smear 08/2021  neg with neg HR HPV - Mammogram 09/05/2022 - Colonoscopy 03/2015 - Bone mineral density 10/2020 - lab work done with PCP, Dr. Quay Burow and Dr. Kelton Pillar - vaccines reviewed/updated.  She will get the RSV and Prevnar 20 vaccines.    2. Vaginal odor - Cervicovaginal ancillary only( [place]) - terazol rx to pharmacy  3. Pelvic kidney  4. Fibroids, intramural  5. Hepatic steatosis  6. Morbid obesity with BMI of 45.0-49.9, adult ([place])

## 2022-09-05 NOTE — Patient Instructions (Signed)
Prevnar 20

## 2022-09-06 LAB — CERVICOVAGINAL ANCILLARY ONLY
Bacterial Vaginitis (gardnerella): NEGATIVE
Candida Glabrata: NEGATIVE
Candida Vaginitis: NEGATIVE
Comment: NEGATIVE
Comment: NEGATIVE
Comment: NEGATIVE

## 2022-09-06 MED ORDER — EMPAGLIFLOZIN 25 MG PO TABS: 25.0000 mg | ORAL_TABLET | Freq: Every day | ORAL | 3 refills | Status: DC

## 2022-09-06 MED ORDER — TIRZEPATIDE 5 MG/0.5ML ~~LOC~~ SOAJ: 5.0000 mg | SUBCUTANEOUS | 1 refills | Status: DC

## 2022-09-06 MED ORDER — METFORMIN HCL ER 750 MG PO TB24: 750.0000 mg | ORAL_TABLET | Freq: Two times a day (BID) | ORAL | 3 refills | Status: DC

## 2022-09-22 DIAGNOSIS — H90A31 Mixed conductive and sensorineural hearing loss, unilateral, right ear with restricted hearing on the contralateral side: Secondary | ICD-10-CM | POA: Insufficient documentation

## 2022-09-23 ENCOUNTER — Other Ambulatory Visit: Payer: Self-pay | Admitting: Internal Medicine

## 2022-09-27 ENCOUNTER — Encounter: Payer: Self-pay | Admitting: Internal Medicine

## 2022-09-27 ENCOUNTER — Ambulatory Visit: Payer: 59 | Admitting: Internal Medicine

## 2022-09-27 VITALS — BP 120/80 | HR 78 | Ht 62.0 in | Wt 238.0 lb

## 2022-09-27 DIAGNOSIS — E1165 Type 2 diabetes mellitus with hyperglycemia: Secondary | ICD-10-CM | POA: Diagnosis not present

## 2022-09-27 DIAGNOSIS — E785 Hyperlipidemia, unspecified: Secondary | ICD-10-CM | POA: Diagnosis not present

## 2022-09-27 LAB — COMPREHENSIVE METABOLIC PANEL
ALT: 18 U/L (ref 0–35)
AST: 13 U/L (ref 0–37)
Albumin: 4.5 g/dL (ref 3.5–5.2)
Alkaline Phosphatase: 82 U/L (ref 39–117)
BUN: 12 mg/dL (ref 6–23)
CO2: 28 mEq/L (ref 19–32)
Calcium: 10.1 mg/dL (ref 8.4–10.5)
Chloride: 104 mEq/L (ref 96–112)
Creatinine, Ser: 1.07 mg/dL (ref 0.40–1.20)
GFR: 56.47 mL/min — ABNORMAL LOW (ref >60.00)
Glucose, Bld: 142 mg/dL — ABNORMAL HIGH (ref 70–99)
Potassium: 4.1 mEq/L (ref 3.5–5.1)
Sodium: 142 mEq/L (ref 135–145)
Total Bilirubin: 0.4 mg/dL (ref 0.2–1.2)
Total Protein: 8.2 g/dL (ref 6.0–8.3)

## 2022-09-27 LAB — LIPID PANEL
Cholesterol: 125 mg/dL (ref 0–200)
HDL: 43.4 mg/dL (ref >39.00)
LDL Cholesterol: 65 mg/dL (ref 0–99)
NonHDL: 81.94
Total CHOL/HDL Ratio: 3
Triglycerides: 87 mg/dL (ref 0.0–149.0)
VLDL: 17.4 mg/dL (ref 0.0–40.0)

## 2022-09-27 LAB — POCT GLYCOSYLATED HEMOGLOBIN (HGB A1C): Hemoglobin A1C: 9.4 % — AB (ref 4.0–5.6)

## 2022-09-27 LAB — HEMOGLOBIN A1C: Hgb A1c MFr Bld: 9.6 % — ABNORMAL HIGH (ref 4.6–6.5)

## 2022-09-27 MED ORDER — FREESTYLE PRECISION NEO TEST VI STRP: ORAL_STRIP | 3 refills | Status: DC

## 2022-09-27 MED ORDER — FREESTYLE LIBRE 3 READER DEVI: 1.0000 | 0 refills | Status: DC

## 2022-09-27 MED ORDER — ROSUVASTATIN CALCIUM 5 MG PO TABS: 5.0000 mg | ORAL_TABLET | Freq: Every day | ORAL | 3 refills | Status: DC

## 2022-09-27 MED ORDER — GLIMEPIRIDE 1 MG PO TABS: 1.0000 mg | ORAL_TABLET | Freq: Every day | ORAL | 3 refills | Status: DC

## 2022-09-27 MED ORDER — FREESTYLE LIBRE 3 SENSOR MISC: 1.0000 | 3 refills | Status: DC

## 2022-09-27 NOTE — Patient Instructions (Signed)
-   Continue Metformin 750 mg twice daily  - Continue  Jardiance 25 mg daily  - Continue Mounjaro 5 mg weekly      HOW TO TREAT LOW BLOOD SUGARS (Blood sugar LESS THAN 70 MG/DL) Please follow the RULE OF 15 for the treatment of hypoglycemia treatment (when your (blood sugars are less than 70 mg/dL)   STEP 1: Take 15 grams of carbohydrates when your blood sugar is low, which includes:  3-4 GLUCOSE TABS  OR 3-4 OZ OF JUICE OR REGULAR SODA OR ONE TUBE OF GLUCOSE GEL    STEP 2: RECHECK blood sugar in 15 MINUTES STEP 3: If your blood sugar is still low at the 15 minute recheck --> then, go back to STEP 1 and treat AGAIN with another 15 grams of carbohydrates. Stop Jardiance stop Jardiance start Synjardy 12 point 12-998, 2 tabs daily Synjardy 12.12-998 mg,

## 2022-09-27 NOTE — Progress Notes (Signed)
Name: Jill [name]  Age/ Sex: 62 y.o., female   MRN/ DOB: [id number], 1960-10-05     PCP: Binnie Rail, MD   Reason for Endocrinology Evaluation: Type 2 Diabetes Mellitus/ Hypercalcemia   Initial Endocrine Consultative Visit: 06/25/2020    PATIENT IDENTIFIER: Ms. Jill [name] is a 62 y.o. female with a past medical history of T2DM, HTN, Ashtma and Primary Hyperparathyroidism ( S/P parathyroidectomy 2022). The patient has followed with Endocrinology clinic since 06/25/2020 for consultative assistance with management of her diabetes.  DIABETIC HISTORY:  Ms. Jill [name] was diagnosed with DM > 30 yrs ago. Ozempic caused GI side effects. No reported intolerance to Trajenta .She was started on Glipizide and Jardiance in 08/2020 Her hemoglobin A1c has ranged from  6.7% in 2019 to 11.7% in 2021   Glipizide was stopped by PCP , we restarted at smaller dose in 12/2020   We stopped Kombiglyze and switch Jardiance to [place] by April 99991111 Started Trulicity April 99991111, this was eventually switched to [facility] in 2023  Glipizide stopped 03/2022 with an A1c of 6.7%     Hyperparathyroid History  The patient was first diagnosed with hypercalcemia in 2015, with maximum serum calcium level of  level 11.2 mg/dL ( Non corrected)    Her 24- hr urinary Ca/Cr ratio of 0.012 with a 191 mg of 24-hr calcium (06/2020) DXA (10/06/2020) low bone density    She met surgical criteria  due to low GFR  She is S/P left inferior parathyroidectomy 05/24/2021  SUBJECTIVE:   During the last visit (03/25/2022): A1c 6.7 %   Today (09/27/2022): Ms. Jill [name]  She checks her blood sugars multiple  times daily, through the CGM. The patient has not  had hypoglycemic episodes since the last clinic visit.  She has had intermittent intake medications  Her joint aches have improved and has been taking rosuvastatin daily without   HOME DIABETES REGIMEN:  -Metformin 750 mg BID  -Jardiance 25 daily -Mounjaro 5 mg weekly   -Rosuvastatin 5 mg daily      Statin: yes ACE-I/ARB: yes    CONTINUOUS GLUCOSE MONITORING RECORD INTERPRETATION    Dates of Recording:2/7-2/20/2024  Sensor description:freestyle libre  Results statistics:   CGM use % of time 83  Average and SD 126/20.4  Time in range 97  % Time Above 180 3  % Time above 250 0  % Time Below target 0   Glycemic patterns summary: Optimal BG's through the day and night  Hyperglycemic episodes  n/a  Hypoglycemic episodes occurred during the day   Overnight periods: trends down     DIABETIC COMPLICATIONS: Microvascular complications:  CKD III Denies: Neuropathy, retinopathy Last Eye Exam: Completed 03/2021  Macrovascular complications:   Denies: CAD, CVA, PVD   HISTORY:  Past Medical History:  Past Medical History:  Diagnosis Date   Allergy    Anemia    Arthritis    Asthma    40yr ago   Chronic deafness of both ears    left ear has 40% hearing-right ear has 60 % hearing   Colon polyps    Complication of anesthesia    Hard to wake up   Constipation    Diabetes mellitus without complication (HCC)    Heartburn    High cholesterol    Migraines    none in last 11 yrs   Obesity    Pelvic kidney    left   Pneumonia    Post partum depression    Urinary incontinence  Past Surgical History:  Past Surgical History:  Procedure Laterality Date   CESAREAN SECTION     COLONOSCOPY WITH PROPOFOL N/A 03/24/2015   Procedure: COLONOSCOPY WITH PROPOFOL;  Surgeon: Juanita Craver, MD;  [facility];  Service: Endoscopy;  Laterality: N/A;   HEMORRHOID BANDING  08/2017, 09/2017   KNEE ARTHROSCOPY WITH MEDIAL MENISECTOMY Left 04/17/2014   Procedure: LEFT KNEE ARTHROSCOPY WITH PARTIAL MEDIAL MENISCECTOMY AND CHONDROPLASTY;  Surgeon: Johnn Hai, MD;  Location: [facility];  Service: Orthopedics;  Laterality: Left;   PARATHYROIDECTOMY Left 05/24/2021   Procedure: LEFT INFERIOR PARATHYROIDECTOMY, POSSIBLE NECK EXPLORATION;   Surgeon: Armandina Gemma, MD;  Location: [facility];  Service: General;  Laterality: Left;   TYMPANOSTOMY TUBE PLACEMENT Right    Social History:  reports that she has never smoked. She has never used smokeless tobacco. She reports that she does not currently use alcohol. She reports that she does not use drugs. Family History:  Family History  Problem Relation Age of Onset   Hypertension Mother    Arthritis Mother    Hypercalcemia Mother    Obesity Mother    Allergic rhinitis Mother    Asthma Mother    Hypertension Sister    Deep vein thrombosis Sister    Pulmonary embolism Sister    Asthma Daughter    Diabetes Maternal Grandmother    Heart failure Maternal Grandmother    Heart disease Maternal Grandmother    Hyperlipidemia Maternal Grandmother    Hypertension Maternal Grandmother    Hypertension Maternal Grandfather    Hyperlipidemia Maternal Grandfather    Breast cancer Paternal Aunt        diagnosed in her 73's   Breast cancer Paternal Grandmother      HOME MEDICATIONS: Allergies as of 09/27/2022       Reactions   Linzess [linaclotide] Other (See Comments)   Abdominal pain    Pravastatin Other (See Comments)   Body aches   Adhesive [tape]    Pulls skin off   Atorvastatin    Muscle aches, stomach upset   Crestor [rosuvastatin]    Stomach pain   Latex Itching   Ozempic (0.25 Or 0.5 Mg-dose) [semaglutide(0.25 Or 0.66m-dos)] Nausea And Vomiting   Stomach pain   Toradol [ketorolac Tromethamine]    Joint pain and swelling   Tramadol Other (See Comments)   JOINT PAIN        Medication List        Accurate as of September 27, 2022  8:38 AM. If you have any questions, ask your nurse or doctor.          CERAVE DAILY MOISTURIZING EX Apply 1 application topically in the morning and at bedtime. Mixed with triamcinolone cream   cetirizine 10 MG tablet Commonly known as: ZYRTEC TAKE 2 TABLETS BY MOUTH 2 TIMES DAILY.   chlorhexidine 0.12 % solution Commonly known  as: PERIDEX Use as directed 15 mLs in the mouth or throat in the morning and at bedtime.   empagliflozin 25 MG Tabs tablet Commonly known as: Jardiance Take 1 tablet (25 mg total) by mouth daily before breakfast.   fluticasone 50 MCG/ACT nasal spray Commonly known as: FLONASE Place 2 sprays into both nostrils daily.   FreeStyle Libre 2 Reader Devi UAD to check sugars.  Dx NIDDM, E11.65   FreeStyle Libre 2 Sensor Misc USE AS DIRECTED TO CHECK SUGARS DX E11.65, NIDDM   FreeStyle Precision Neo Test test strip Generic drug: glucose blood USE AS INSTRUCTED   losartan  25 MG tablet Commonly known as: COZAAR TAKE 1 TABLET BY MOUTH EVERY DAY   metFORMIN 750 MG 24 hr tablet Commonly known as: GLUCOPHAGE-XR Take 1 tablet (750 mg total) by mouth in the morning and at bedtime.   olopatadine 0.1 % ophthalmic solution Commonly known as: PATANOL Place 1 drop into both eyes 2 (two) times daily.   Prevnar 20 0.5 ML injection Generic drug: pneumococcal 20-valent conjugate vaccine Inject into the muscle.   rosuvastatin 5 MG tablet Commonly known as: CRESTOR Take 1 tablet (5 mg total) by mouth daily.   terconazole 0.4 % vaginal cream Commonly known as: TERAZOL 7 Place 1 applicator vaginally at bedtime.   tirzepatide 5 MG/0.5ML Pen Commonly known as: MOUNJARO Inject 5 mg into the skin once a week.   triamcinolone ointment 0.1 % Commonly known as: KENALOG Apply 1 Application topically 2 (two) times daily.   Vitamin D3 50 MCG (2000 UT) Tabs Generic drug: Cholecalciferol Take 2,000 Units by mouth in the morning.         OBJECTIVE:   Vital Signs: BP 120/80 (BP Location: Left Arm, Patient Position: Sitting, Cuff Size: Large)   Pulse 78   Ht 5' 2"$  (1.575 m)   Wt 238 lb (108 kg)   SpO2 96%   BMI 43.53 kg/m   Wt Readings from Last 3 Encounters:  09/27/22 238 lb (108 kg)  09/05/22 245 lb 3.2 oz (111.2 kg)  06/23/22 234 lb (106.1 kg)     Exam: General: Pt appears well  and is in NAD  Lungs: Clear with good BS bilat   Heart: RRR   Extremities: No pretibial edema.   Neuro: MS is good with appropriate affect, pt is alert and Ox3     DM Foot Exam 09/27/2022  The skin of the feet is intact without sores or ulcerations. The pedal pulses are 2+ on right and 2+ on left. The sensation is intact to a screening 5.07, 10 gram monofilament bilaterally     DATA REVIEWED:  Lab Results  Component Value Date   HGBA1C 9.4 (A) 09/27/2022   HGBA1C 6.7 (H) 06/23/2022   HGBA1C 6.7 (A) 03/25/2022    Latest Reference Range & Units 09/27/22 09:03  Sodium 135 - 145 mEq/L 142  Potassium 3.5 - 5.1 mEq/L 4.1  Chloride 96 - 112 mEq/L 104  CO2 19 - 32 mEq/L 28  Glucose 70 - 99 mg/dL 142 (H)  BUN 6 - 23 mg/dL 12  Creatinine 0.40 - 1.20 mg/dL 1.07  Calcium 8.4 - 10.5 mg/dL 10.1  Alkaline Phosphatase 39 - 117 U/L 82  Albumin 3.5 - 5.2 g/dL 4.5  AST 0 - 37 U/L 13  ALT 0 - 35 U/L 18  Total Protein 6.0 - 8.3 g/dL 8.2  Total Bilirubin 0.2 - 1.2 mg/dL 0.4  GFR >60.00 mL/min 56.47 (L)    Latest Reference Range & Units 09/27/22 09:03  Total CHOL/HDL Ratio  3  Cholesterol 0 - 200 mg/dL 125  HDL Cholesterol >39.00 mg/dL 43.40  LDL (calc) 0 - 99 mg/dL 65  NonHDL  81.94  Triglycerides 0.0 - 149.0 mg/dL 87.0  VLDL 0.0 - 40.0 mg/dL 17.4    Latest Reference Range & Units 09/27/22 09:03  Glucose 70 - 99 mg/dL 142 (H)  Hemoglobin A1C 4.6 - 6.5 % 9.6 Repeated and verified X2. (H)  (H): Data is abnormally high     Latest Reference Range & Units 06/23/22 10:45  Sodium 135 - 145 mEq/L 141  Potassium 3.5 - 5.1 mEq/L 4.1  Chloride 96 - 112 mEq/L 105  CO2 19 - 32 mEq/L 29  Glucose 70 - 99 mg/dL 154 (H)  BUN 6 - 23 mg/dL 9  Creatinine 0.40 - 1.20 mg/dL 0.90  Calcium 8.4 - 10.5 mg/dL 9.4  Alkaline Phosphatase 39 - 117 U/L 75  Albumin 3.5 - 5.2 g/dL 4.2  AST 0 - 37 U/L 13  ALT 0 - 35 U/L 18  Total Protein 6.0 - 8.3 g/dL 7.7  Total Bilirubin 0.2 - 1.2 mg/dL 0.5  GFR  >60.00 mL/min 69.62  Total CHOL/HDL Ratio  4  Cholesterol 0 - 200 mg/dL 219 (H)  HDL Cholesterol >39.00 mg/dL 50.30  LDL (calc) 0 - 99 mg/dL 156 (H)  NonHDL  168.82  Triglycerides 0.0 - 149.0 mg/dL 66.0  VLDL 0.0 - 40.0 mg/dL 13.2  (H): Data is abnormally high  ASSESSMENT / PLAN / RECOMMENDATIONS:   1) Type 2 Diabetes Mellitus, Poorly   controlled, With CKD III complications - Most recent A1c of 9.4 %. Goal A1c < 7.0 %.    -Her A1c has increased from 6.7% to 9.4%, average BG's on freestyle libre is equivalent to 6.3% -We discussed that due to discrepancy between the 2 we will proceed repeat A1c to serum , which is consistent with her in office A1c at 9.6% - Intolerant to higher doses of Mounjaro  - Will switch Freestyle libre3 , I have asked her to confirm hypoglycemia with a fingerstick -Will start glimepiride as below    MEDICATIONS: - Start Glimepiride 1 mg, 1 tablet before breakfast - Continue metformin 750 mg twice daily - Continue Jardiance 25 mg daily - Continue Mounjaro 5 mg weekly    EDUCATION / INSTRUCTIONS: BG monitoring instructions: Patient is instructed to check her blood sugars 3  times a day, before meals . Call [facility] if: BG persistently < 70 I reviewed the Rule of 15 for the treatment of hypoglycemia in detail with the patient. Literature supplied.  2) Diabetic complications:  Eye: Does not have known diabetic retinopathy.  Neuro/ Feet: Does not have known diabetic peripheral neuropathy .  Renal: Patient does  have known baseline CKD. She   is not on an ACEI/ARB at present.      3) Dyslipidemia:   -She has reported prior  intolerance to daily rosuvastatin and atorvastatin in the past  -Zetia has been cost prohibitive  - We stopped Pravastatin and started weekly Rosuvastatin 12/2020 -Following parathyroidectomy, the patient has noted resolution of joint aches and pains and increased rosuvastatin to daily without any side effects  2024  -LDL at goal  Continue Rosuvastatin 5 mg daily      F/U in 4 months     Signed electronically by: Mack Guise, MD  [facility]  [facility] [address] [place], [place] [address] Phone: [phone] FAX: [phone]   CC: Binnie Rail, MD [place] [address] Phone: [phone]  Fax: [phone]  Return to Endocrinology clinic as below: Future Appointments  Date Time Provider [place]  12/23/2022 10:00 AM Quay Burow, Claudina Lick, MD [facility] None

## 2022-09-28 ENCOUNTER — Other Ambulatory Visit: Payer: Self-pay

## 2022-09-28 MED ORDER — FREESTYLE PRECISION NEO TEST VI STRP: ORAL_STRIP | 3 refills | Status: DC

## 2022-10-07 ENCOUNTER — Other Ambulatory Visit: Payer: Self-pay | Admitting: Allergy & Immunology

## 2022-10-13 ENCOUNTER — Other Ambulatory Visit: Payer: Self-pay | Admitting: Internal Medicine

## 2022-10-21 ENCOUNTER — Other Ambulatory Visit (HOSPITAL_COMMUNITY): Payer: Self-pay

## 2022-10-30 ENCOUNTER — Other Ambulatory Visit: Payer: Self-pay | Admitting: Allergy & Immunology

## 2022-11-17 LAB — HM DIABETES EYE EXAM

## 2022-11-22 ENCOUNTER — Telehealth: Payer: Self-pay

## 2022-11-22 NOTE — Telephone Encounter (Signed)
PA request received via CMM for FreeStyle Libre 3 Reader device  PA has been submitted to [employer] and is pending determination  Key: [id number]

## 2022-11-23 NOTE — Telephone Encounter (Signed)
PA has been DENIED. Determination letter has been faxed to office per [employer]

## 2022-11-23 NOTE — Telephone Encounter (Signed)
No additional information was provided in the initial notice of Denial. Additional information will be in the determination letter sent to the office per the insurance.

## 2022-12-22 ENCOUNTER — Encounter: Payer: Self-pay | Admitting: Internal Medicine

## 2022-12-22 NOTE — Patient Instructions (Addendum)
Blood work was ordered.   The lab is on the first floor.    Medications changes include :   none      Return in about 6 months (around 06/25/2023) for follow up.   Health Maintenance, Female Adopting a healthy lifestyle and getting preventive care are important in promoting health and wellness. Ask your health care provider about: The right schedule for you to have regular tests and exams. Things you can do on your own to prevent diseases and keep yourself healthy. What should I know about diet, weight, and exercise? Eat a healthy diet  Eat a diet that includes plenty of vegetables, fruits, low-fat dairy products, and lean protein. Do not eat a lot of foods that are high in solid fats, added sugars, or sodium. Maintain a healthy weight Body mass index (BMI) is used to identify weight problems. It estimates body fat based on height and weight. Your health care provider can help determine your BMI and help you achieve or maintain a healthy weight. Get regular exercise Get regular exercise. This is one of the most important things you can do for your health. Most adults should: Exercise for at least 150 minutes each week. The exercise should increase your heart rate and make you sweat (moderate-intensity exercise). Do strengthening exercises at least twice a week. This is in addition to the moderate-intensity exercise. Spend less time sitting. Even light physical activity can be beneficial. Watch cholesterol and blood lipids Have your blood tested for lipids and cholesterol at 61 years of age, then have this test every 5 years. Have your cholesterol levels checked more often if: Your lipid or cholesterol levels are high. You are older than 61 years of age. You are at high risk for heart disease. What should I know about cancer screening? Depending on your health history and family history, you may need to have cancer screening at various ages. This may include screening  for: Breast cancer. Cervical cancer. Colorectal cancer. Skin cancer. Lung cancer. What should I know about heart disease, diabetes, and high blood pressure? Blood pressure and heart disease High blood pressure causes heart disease and increases the risk of stroke. This is more likely to develop in people who have high blood pressure readings or are overweight. Have your blood pressure checked: Every 3-5 years if you are 51-84 years of age. Every year if you are 3 years old or older. Diabetes Have regular diabetes screenings. This checks your fasting blood sugar level. Have the screening done: Once every three years after age 66 if you are at a normal weight and have a low risk for diabetes. More often and at a younger age if you are overweight or have a high risk for diabetes. What should I know about preventing infection? Hepatitis B If you have a higher risk for hepatitis B, you should be screened for this virus. Talk with your health care provider to find out if you are at risk for hepatitis B infection. Hepatitis C Testing is recommended for: Everyone born from [birth year] through 1964. Anyone with known risk factors for hepatitis C. Sexually transmitted infections (STIs) Get screened for STIs, including gonorrhea and chlamydia, if: You are sexually active and are younger than 61 years of age. You are older than 61 years of age and your health care provider tells you that you are at risk for this type of infection. Your sexual activity has changed since you were last screened, and you are  at increased risk for chlamydia or gonorrhea. Ask your health care provider if you are at risk. Ask your health care provider about whether you are at high risk for HIV. Your health care provider may recommend a prescription medicine to help prevent HIV infection. If you choose to take medicine to prevent HIV, you should first get tested for HIV. You should then be tested every 3 months for as long as you  are taking the medicine. Pregnancy If you are about to stop having your period (premenopausal) and you may become pregnant, seek counseling before you get pregnant. Take 400 to 800 micrograms (mcg) of folic acid every day if you become pregnant. Ask for birth control (contraception) if you want to prevent pregnancy. Osteoporosis and menopause Osteoporosis is a disease in which the bones lose minerals and strength with aging. This can result in bone fractures. If you are 69 years old or older, or if you are at risk for osteoporosis and fractures, ask your health care provider if you should: Be screened for bone loss. Take a calcium or vitamin D supplement to lower your risk of fractures. Be given hormone replacement therapy (HRT) to treat symptoms of menopause. Follow these instructions at home: Alcohol use Do not drink alcohol if: Your health care provider tells you not to drink. You are pregnant, may be pregnant, or are planning to become pregnant. If you drink alcohol: Limit how much you have to: 0-1 drink a day. Know how much alcohol is in your drink. In the [place] one drink equals one 12 oz bottle of beer (355 mL), one 5 oz glass of wine (148 mL), or one 1 oz glass of hard liquor (44 mL). Lifestyle Do not use any products that contain nicotine or tobacco. These products include cigarettes, chewing tobacco, and vaping devices, such as e-cigarettes. If you need help quitting, ask your health care provider. Do not use street drugs. Do not share needles. Ask your health care provider for help if you need support or information about quitting drugs. General instructions Schedule regular health, dental, and eye exams. Stay current with your vaccines. Tell your health care provider if: You often feel depressed. You have ever been abused or do not feel safe at home. Summary Adopting a healthy lifestyle and getting preventive care are important in promoting health and wellness. Follow your  health care provider's instructions about healthy diet, exercising, and getting tested or screened for diseases. Follow your health care provider's instructions on monitoring your cholesterol and blood pressure. This information is not intended to replace advice given to you by your health care provider. Make sure you discuss any questions you have with your health care provider. Document Revised: 12/14/2020 Document Reviewed: 12/14/2020 Elsevier Patient Education  2023 [employer].

## 2022-12-22 NOTE — Progress Notes (Signed)
Subjective:    Patient ID: Jill [name], female    DOB: 12/16/1960, 62 y.o.   MRN: [id number]      HPI Jill [name] is here for a Physical exam and her chronic medical problems.   Can not get mounjaro - has been out of it for 2 weeks.  Sugars were doing okay, but now being out of medication and then in the 200s.   Medications and allergies reviewed with patient and updated if appropriate.  Current Outpatient Medications on File Prior to Visit  Medication Sig Dispense Refill   cetirizine (ZYRTEC) 10 MG tablet TAKE 2 TABLETS (20 MG TOTAL) BY MOUTH DAILY AS NEEDED. 180 tablet 0   chlorhexidine (PERIDEX) 0.12 % solution Use as directed 15 mLs in the mouth or throat in the morning and at bedtime.     Cholecalciferol (VITAMIN D3) 50 MCG (2000 UT) TABS Take 2,000 Units by mouth in the morning.     Continuous Blood Gluc Sensor (FREESTYLE LIBRE 3 SENSOR) MISC 1 Device by Does not apply route every 14 (fourteen) days. Place 1 sensor on the skin every 14 days. Use to check glucose continuously 6 each 3   Emollient (CERAVE DAILY MOISTURIZING EX) Apply 1 application topically in the morning and at bedtime. Mixed with triamcinolone cream     empagliflozin (JARDIANCE) 25 MG TABS tablet Take 1 tablet (25 mg total) by mouth daily before breakfast. 90 tablet 3   fluticasone (FLONASE) 50 MCG/ACT nasal spray Place 2 sprays into both nostrils daily.     glimepiride (AMARYL) 1 MG tablet Take 1 tablet (1 mg total) by mouth daily with breakfast. 90 tablet 3   metFORMIN (GLUCOPHAGE-XR) 750 MG 24 hr tablet Take 1 tablet (750 mg total) by mouth in the morning and at bedtime. 180 tablet 3   olopatadine (PATANOL) 0.1 % ophthalmic solution Place 1 drop into both eyes 2 (two) times daily. 5 mL 5   rosuvastatin (CRESTOR) 5 MG tablet Take 1 tablet (5 mg total) by mouth daily. 90 tablet 3   tirzepatide (MOUNJARO) 5 MG/0.5ML Pen Inject 5 mg into the skin once a week. 6 mL 1   No current facility-administered  medications on file prior to visit.    Review of Systems  Constitutional:  Negative for fever.  Eyes:  Negative for visual disturbance.  Respiratory:  Negative for cough, shortness of breath and wheezing.   Cardiovascular:  Negative for chest pain, palpitations and leg swelling.  Gastrointestinal:  Negative for abdominal pain, blood in stool, constipation and diarrhea.       No gerd  Genitourinary:  Negative for dysuria.  Musculoskeletal:  Positive for arthralgias (mild). Negative for back pain.  Skin:  Negative for rash.  Neurological:  Positive for headaches (occ - today has forehead headache). Negative for dizziness and light-headedness.  Psychiatric/Behavioral:  Negative for dysphoric mood. The patient is not nervous/anxious.        Objective:   Vitals:   12/23/22 0957  BP: 124/72  Pulse: 81  Temp: 98.2 F (36.8 C)  SpO2: 98%   Filed Weights   12/23/22 0957  Weight: 237 lb (107.5 kg)   Body mass index is 43.35 kg/m.  BP Readings from Last 3 Encounters:  12/23/22 124/72  09/27/22 120/80  09/05/22 123/61    Wt Readings from Last 3 Encounters:  12/23/22 237 lb (107.5 kg)  09/27/22 238 lb (108 kg)  09/05/22 245 lb 3.2 oz (111.2 kg)  Physical Exam Constitutional: She appears well-developed and well-nourished. No distress.  HENT:  Head: Normocephalic and atraumatic.  Right Ear: External ear normal. Normal ear canal and TM Left Ear: External ear normal.  Normal ear canal and TM Mouth/Throat: Oropharynx is clear and moist.  Eyes: Conjunctivae normal.  Neck: Neck supple. No tracheal deviation present. No thyromegaly present.  No carotid bruit  Cardiovascular: Normal rate, regular rhythm and normal heart sounds.   No murmur heard.  No edema. Pulmonary/Chest: Effort normal and breath sounds normal. No respiratory distress. She has no wheezes. She has no rales.  Breast: deferred   Abdominal: Soft. She exhibits no distension. There is no tenderness.   Lymphadenopathy: She has no cervical adenopathy.  Skin: Skin is warm and dry. She is not diaphoretic.  Psychiatric: She has a normal mood and affect. Her behavior is normal.     Lab Results  Component Value Date   WBC 5.0 12/21/2021   HGB 14.0 12/21/2021   HCT 42.4 12/21/2021   PLT 216.0 12/21/2021   GLUCOSE 142 (H) 09/27/2022   CHOL 125 09/27/2022   TRIG 87.0 09/27/2022   HDL 43.40 09/27/2022   LDLDIRECT 125 (H) 09/10/2014   LDLCALC 65 09/27/2022   ALT 18 09/27/2022   AST 13 09/27/2022   NA 142 09/27/2022   K 4.1 09/27/2022   CL 104 09/27/2022   CREATININE 1.07 09/27/2022   BUN 12 09/27/2022   CO2 28 09/27/2022   TSH 1.29 12/15/2020   HGBA1C 9.6 Repeated and verified X2. (H) 09/27/2022   MICROALBUR <0.7 12/21/2021         Assessment & Plan:   Physical exam: Screening blood work  ordered Exercise  more active -- exercise rstressed Weight  obese- stressed weight loss Substance abuse  none   Reviewed recommended immunizations.   Health Maintenance  Topic Date Due   COVID-19 Vaccine (4 - 2023-24 season) 04/08/2022   Diabetic kidney evaluation - Urine ACR  12/22/2022   HEMOGLOBIN A1C  03/28/2023   Diabetic kidney evaluation - eGFR measurement  09/28/2023   FOOT EXAM  09/28/2023   OPHTHALMOLOGY EXAM  11/17/2023   PAP SMEAR-Modifier  08/30/2024   MAMMOGRAM  09/05/2024   COLONOSCOPY (Pts 45-79yrs Insurance coverage will need to be confirmed)  03/23/2025   DTaP/Tdap/Td (5 - Td or Tdap) 12/27/2026   Hepatitis C Screening  Completed   HIV Screening  Completed   HPV VACCINES  Aged Out   Zoster Vaccines- Shingrix  Discontinued          See Problem List for Assessment and Plan of chronic medical problems.

## 2022-12-23 ENCOUNTER — Ambulatory Visit (INDEPENDENT_AMBULATORY_CARE_PROVIDER_SITE_OTHER): Payer: 59 | Admitting: Internal Medicine

## 2022-12-23 VITALS — BP 124/72 | HR 81 | Temp 98.2°F | Ht 62.0 in | Wt 237.0 lb

## 2022-12-23 DIAGNOSIS — E782 Mixed hyperlipidemia: Secondary | ICD-10-CM

## 2022-12-23 DIAGNOSIS — E559 Vitamin D deficiency, unspecified: Secondary | ICD-10-CM

## 2022-12-23 DIAGNOSIS — E1165 Type 2 diabetes mellitus with hyperglycemia: Secondary | ICD-10-CM | POA: Diagnosis not present

## 2022-12-23 DIAGNOSIS — Z8639 Personal history of other endocrine, nutritional and metabolic disease: Secondary | ICD-10-CM

## 2022-12-23 DIAGNOSIS — Z6841 Body Mass Index (BMI) 40.0 and over, adult: Secondary | ICD-10-CM

## 2022-12-23 DIAGNOSIS — I10 Essential (primary) hypertension: Secondary | ICD-10-CM | POA: Diagnosis not present

## 2022-12-23 DIAGNOSIS — Z7984 Long term (current) use of oral hypoglycemic drugs: Secondary | ICD-10-CM | POA: Diagnosis not present

## 2022-12-23 DIAGNOSIS — Z0001 Encounter for general adult medical examination with abnormal findings: Secondary | ICD-10-CM

## 2022-12-23 DIAGNOSIS — Z0000 Encounter for general adult medical examination without abnormal findings: Secondary | ICD-10-CM

## 2022-12-23 DIAGNOSIS — L299 Pruritus, unspecified: Secondary | ICD-10-CM | POA: Diagnosis not present

## 2022-12-23 LAB — CBC WITH DIFFERENTIAL/PLATELET
Basophils Absolute: 0 10*3/uL (ref 0.0–0.1)
Basophils Relative: 0.7 % (ref 0.0–3.0)
Eosinophils Absolute: 0.1 10*3/uL (ref 0.0–0.7)
Eosinophils Relative: 1.5 % (ref 0.0–5.0)
HCT: 41 % (ref 36.0–46.0)
Hemoglobin: 13.7 g/dL (ref 12.0–15.0)
Lymphocytes Relative: 34.2 % (ref 12.0–46.0)
Lymphs Abs: 1.7 10*3/uL (ref 0.7–4.0)
MCHC: 33.3 g/dL (ref 30.0–36.0)
MCV: 82.9 fl (ref 78.0–100.0)
Monocytes Absolute: 0.3 10*3/uL (ref 0.1–1.0)
Monocytes Relative: 5.6 % (ref 3.0–12.0)
Neutro Abs: 2.9 10*3/uL (ref 1.4–7.7)
Neutrophils Relative %: 58 % (ref 43.0–77.0)
Platelets: 232 10*3/uL (ref 150.0–400.0)
RBC: 4.94 Mil/uL (ref 3.87–5.11)
RDW: 15.5 % (ref 11.5–15.5)
WBC: 5 10*3/uL (ref 4.0–10.5)

## 2022-12-23 LAB — LIPID PANEL
Cholesterol: 198 mg/dL (ref 0–200)
HDL: 49.3 mg/dL (ref >39.00)
LDL Cholesterol: 137 mg/dL — ABNORMAL HIGH (ref 0–99)
NonHDL: 149.16
Total CHOL/HDL Ratio: 4
Triglycerides: 61 mg/dL (ref 0.0–149.0)
VLDL: 12.2 mg/dL (ref 0.0–40.0)

## 2022-12-23 LAB — COMPREHENSIVE METABOLIC PANEL
ALT: 24 U/L (ref 0–35)
AST: 16 U/L (ref 0–37)
Albumin: 4.2 g/dL (ref 3.5–5.2)
Alkaline Phosphatase: 74 U/L (ref 39–117)
BUN: 10 mg/dL (ref 6–23)
CO2: 30 mEq/L (ref 19–32)
Calcium: 9.7 mg/dL (ref 8.4–10.5)
Chloride: 103 mEq/L (ref 96–112)
Creatinine, Ser: 0.99 mg/dL (ref 0.40–1.20)
GFR: 61.88 mL/min (ref >60.00)
Glucose, Bld: 144 mg/dL — ABNORMAL HIGH (ref 70–99)
Potassium: 4.4 mEq/L (ref 3.5–5.1)
Sodium: 140 mEq/L (ref 135–145)
Total Bilirubin: 0.5 mg/dL (ref 0.2–1.2)
Total Protein: 7.7 g/dL (ref 6.0–8.3)

## 2022-12-23 LAB — MICROALBUMIN / CREATININE URINE RATIO
Creatinine,U: 177.1 mg/dL
Microalb Creat Ratio: 0.9 mg/g (ref 0.0–30.0)
Microalb, Ur: 1.7 mg/dL (ref 0.0–1.9)

## 2022-12-23 LAB — VITAMIN D 25 HYDROXY (VIT D DEFICIENCY, FRACTURES): VITD: 42.84 ng/mL (ref 30.00–100.00)

## 2022-12-23 LAB — HEMOGLOBIN A1C: Hgb A1c MFr Bld: 6.1 % (ref 4.6–6.5)

## 2022-12-23 MED ORDER — LOSARTAN POTASSIUM 25 MG PO TABS: 25.0000 mg | ORAL_TABLET | Freq: Every day | ORAL | 3 refills | Status: DC

## 2022-12-23 MED ORDER — TRIAMCINOLONE ACETONIDE 0.1 % EX OINT: 1.0000 | TOPICAL_OINTMENT | Freq: Two times a day (BID) | CUTANEOUS | 3 refills | Status: DC

## 2022-12-23 NOTE — Assessment & Plan Note (Signed)
Chronic Taking vitamin D daily Check vitamin D level  

## 2022-12-23 NOTE — Assessment & Plan Note (Addendum)
Chronic   Lab Results  Component Value Date   HGBA1C 9.6 Repeated and verified X2. (H) 09/27/2022   Sugars poorly controlled Testing sugars 1 times a day Check A1c, urine microalbumin Following with endocrine Continue Jardiance 25 mg daily, glimepiride 1 mg daily, metformin 750 mg twice daily, Mounjaro 5 mg daily-does not tolerate a higher dose.  Discussed trying to get the prescription at a different pharmacy Discussed exercise, diabetic diet

## 2022-12-23 NOTE — Assessment & Plan Note (Signed)
Chronic Has lost some weight She is not exercising regularly now, but plans on getting back to the gym soon-stressed the importance of regular exercise to help control the sugars Diabetic diet, small portions

## 2022-12-23 NOTE — Assessment & Plan Note (Signed)
Chronic Related to allergies Uses triamcinolone mixed with CerVe

## 2022-12-23 NOTE — Assessment & Plan Note (Signed)
S/p surgery Check PTH

## 2022-12-23 NOTE — Assessment & Plan Note (Signed)
Chronic BP well controlled Continue losartan 25 mg daily cmp  

## 2022-12-23 NOTE — Assessment & Plan Note (Signed)
Chronic Check lipid panel  Continue crestor 5 mg daily Regular exercise and healthy diet encouraged  

## 2022-12-24 ENCOUNTER — Encounter: Payer: Self-pay | Admitting: Allergy & Immunology

## 2022-12-25 MED ORDER — EZETIMIBE 10 MG PO TABS
10.0000 mg | ORAL_TABLET | Freq: Every day | ORAL | 5 refills | Status: DC
Start: 2022-12-25 — End: 2023-02-02

## 2022-12-25 NOTE — Addendum Note (Signed)
Addended by: Pincus Sanes on: 12/25/2022 09:14 PM   Modules accepted: Orders

## 2022-12-28 LAB — PTH, INTACT AND CALCIUM
Calcium: 9.7 mg/dL (ref 8.6–10.4)
PTH: 50 pg/mL (ref 16–77)

## 2023-01-05 ENCOUNTER — Other Ambulatory Visit: Payer: Self-pay

## 2023-01-05 ENCOUNTER — Ambulatory Visit: Payer: 59 | Admitting: Allergy & Immunology

## 2023-01-05 ENCOUNTER — Encounter: Payer: Self-pay | Admitting: Allergy & Immunology

## 2023-01-05 VITALS — BP 148/86 | HR 81 | Temp 97.9°F | Ht 62.0 in | Wt 240.6 lb

## 2023-01-05 DIAGNOSIS — R221 Localized swelling, mass and lump, neck: Secondary | ICD-10-CM

## 2023-01-05 DIAGNOSIS — L299 Pruritus, unspecified: Secondary | ICD-10-CM | POA: Diagnosis not present

## 2023-01-05 MED ORDER — FAMOTIDINE 20 MG PO TABS: 20.0000 mg | ORAL_TABLET | Freq: Two times a day (BID) | ORAL | 5 refills | Status: DC

## 2023-01-05 MED ORDER — MONTELUKAST SODIUM 10 MG PO TABS: 10.0000 mg | ORAL_TABLET | Freq: Every day | ORAL | 5 refills | Status: DC

## 2023-01-05 MED ORDER — LEVOCETIRIZINE DIHYDROCHLORIDE 5 MG PO TABS
5.0000 mg | ORAL_TABLET | Freq: Every evening | ORAL | 5 refills | Status: DC
Start: 2023-01-05 — End: 2023-02-01

## 2023-01-05 MED ORDER — PIMECROLIMUS 1 % EX CREA
TOPICAL_CREAM | Freq: Two times a day (BID) | CUTANEOUS | 5 refills | Status: DC
Start: 2023-01-05 — End: 2023-10-31

## 2023-01-05 NOTE — Progress Notes (Signed)
FOLLOW UP  Date of Service/Encounter:  01/05/23   Assessment:   Pruritus - with recurrence of symptoms (getting labs today)  History of eczema - has been on hydrocortisone and triamcinolone (adding Elidel today)  Consider Rinvoq at the next visit (needs Quantiferon Gold screening)   History of albuterol use - with normal spirometry    History of hyperparathroidism   Plan/Recommendations:   1. Pruritus - with sensitization to tree pollen - Stop the cetirizine and start the following:   - AM: Xyzal 10mg  + Pepcid 20mg    - PM: Xyzal 10mg  + Pepcid 20mg  daily + Singulair 10mg  daily - We will continue with triamcinolone twice daily as needed. - Add on Elidel twice daily as needed (samples provided). - Elidel is a non-steroidal, so it can used from head to toe.  - We are going get some labs to recheck a few things. - We are going to check on peanut and tree nuts as well las coconut allergy testing. - I might consider adding on Dupixent or Rinvoq to help with the itching, but we have to "fail" some certain creams before we can get this approved.   2. Cough - with history of wheezing with COVID19 - Resolved.   3. Return in about 2 months or earlier if needed.   Subjective:   Jill [name] is a 62 y.o. female presenting today for follow up of  Chief Complaint  Patient presents with   Follow-up   Pruritus    C/o of itchy throat,eyes,ears and entire body x 2 months    Jill [name] has a history of the following: Patient Active Problem List   Diagnosis Date Noted   Mixed conductive and sensorineural hearing loss of right ear with restricted hearing of left ear 09/22/2022   Chronic right hip pain 02/15/2022   Multiple thyroid nodules 05/23/2021   History of primary hyperparathyroidism s/p surgery 09/30/2020   Arthralgia 08/18/2020   Hepatic steatosis 08/18/2020   Statin myopathy 08/18/2020   Morbid obesity with BMI of 40.0-44.9, adult (HCC) 08/18/2020   Pruritus  04/12/2020   Hypertension 01/13/2020   Hyperlipidemia 01/13/2020   COVID-19 virus infection 09/13/2019   Vitamin D deficiency 04/08/2019   Hemorrhoids 05/29/2017   Fibroids, intramural 05/03/2016   Pelvic kidney    Diabetes mellitus (HCC)    OBSTRUCTIVE SLEEP APNEA 05/19/2009   MOTION SICKNESS 03/12/2007   ASTHMA 01/31/2007    History obtained from: chart review and patient.  Jill [name] is a 62 y.o. female presenting for a follow up visit.  She was last seen in June 2023.  At that time, we continue with cetirizine 20 mg once daily as needed for her pruritus.  We also continue with triamcinolone twice daily as needed.  We did not do lung testing.  Her cough seems to have resolved as she got farther away from her COVID-19 infection.  Since last visit, she has done well.   However, the itchiness has worsened. It started March or so. It started since that time and now it is controllable. She is using the triamcinolone and Cerve. This is not helping at all. She remains on the cetirizine twice daily. At one point, she was doing two twice daily. She is not sure whether the cetirizine is helping. There are no hives at all. Her skin is a bit dryer than normal. The itching is all day and all night.   She did have eczema as a child. 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 She has some skin flaking. There was not really any changes in March aside from the increase in pollen exposure. She does see a Dermatologist (Dr. Yetta Barre). She has never had a biopsy. She has tried over the counter hydrocortisone. She has some triamcinolone that she will continue to use. She has never been on Protopic, Elidel, or Jill [name].   She has only ever been positive to trees. She does have some eye drops that are not helping at all. She does have some osteoarthritis which is improving.   She has no history of heart problems at all. She has no history of liver problems. She has no history of hyperlipidemia. Her last panel was two  weeks ago and showed an LDL of 137, otherwise negative. Her CBC at the time was normal.   She had a surgery for hyperparathyroidism in October 2022. This was discovered because her calcium continued to be elevated.   Otherwise, there have been no changes to her past medical history, surgical history, family history, or social history.    Review of Systems  Constitutional: Negative.  Negative for chills, fever, malaise/fatigue and weight loss.  HENT:  Negative for congestion, ear discharge, ear pain and sinus pain.   Eyes:  Negative for pain, discharge and redness.  Respiratory:  Negative for cough, sputum production, shortness of breath and wheezing.   Cardiovascular: Negative.  Negative for chest pain and palpitations.  Gastrointestinal:  Negative for abdominal pain, constipation, diarrhea, heartburn, nausea and vomiting.  Skin: Negative.  Negative for itching and rash.  Neurological:  Negative for dizziness and headaches.  Endo/Heme/Allergies:  Negative for environmental allergies. Does not bruise/bleed easily.       Objective:   Blood pressure (!) 148/86, pulse 81, temperature 97.9 F (36.6 C), height 5\' 2"  (1.575 m), weight 240 lb 9.6 oz (109.1 kg), SpO2 98 %. Body mass index is 44.01 kg/m.    Physical Exam Vitals reviewed.  Constitutional:      Appearance: She is well-developed. She is obese.     Comments: Very lovely and friendly. Cooperative with the exam.   HENT:     Head: Normocephalic and atraumatic.     Right Ear: Tympanic membrane, ear canal and external ear normal.     Left Ear: Tympanic membrane, ear canal and external ear normal.     Nose: No nasal deformity, septal deviation, mucosal edema or rhinorrhea.     Right Turbinates: Not enlarged, swollen or pale.     Left Turbinates: Not enlarged, swollen or pale.     Right Sinus: No maxillary sinus tenderness or frontal sinus tenderness.     Left Sinus: No maxillary sinus tenderness or frontal sinus tenderness.      Mouth/Throat:     Mouth: Mucous membranes are not pale and not dry.     Pharynx: Uvula midline.  Eyes:     General: Lids are normal. No allergic shiner.       Right eye: No discharge.        Left eye: No discharge.     Conjunctiva/sclera: Conjunctivae normal.     Right eye: Right conjunctiva is not injected. No chemosis.    Left eye: Left conjunctiva is not injected. No chemosis.    Pupils: Pupils are equal, round, and reactive to light.  Cardiovascular:     Rate and Rhythm: Normal rate and regular rhythm.     Heart sounds: Normal heart sounds.  Pulmonary:     Effort: Pulmonary effort is normal.  No tachypnea, accessory muscle usage or respiratory distress.     Breath sounds: Normal breath sounds. No wheezing, rhonchi or rales.     Comments: Moving air well in all lung fields.  No increased work of breathing. Chest:     Chest wall: No tenderness.  Lymphadenopathy:     Cervical: No cervical adenopathy.  Skin:    General: Skin is warm.     Capillary Refill: Capillary refill takes less than 2 seconds.     Coloration: Skin is not pale.     Findings: No abrasion, erythema, petechiae or rash. Rash is not papular, urticarial or vesicular.     Comments: Thickened skin on the bilateral elbows.  There are some excoriations.  Overall, her skin looks much better than previous exams.  Neurological:     Mental Status: She is alert.  Psychiatric:        Behavior: Behavior is cooperative.      Diagnostic studies: none       Malachi Bonds, MD  [facility]

## 2023-01-05 NOTE — Patient Instructions (Addendum)
1. Pruritus - with sensitization to tree pollen - Stop the cetirizine and start the following:   - AM: Xyzal 10mg  + Pepcid 20mg    - PM: Xyzal 10mg  + Pepcid 20mg  daily + Singulair 10mg  daily - We will continue with triamcinolone twice daily as needed. - Add on Elidel twice daily as needed (samples provided). - Elidel is a non-steroidal, so it can used from head to toe.  - We are going get some labs to recheck a few things. - We are going to check on peanut and tree nuts as well las coconut allergy testing. - I might consider adding on Dupixent or Rinvoq to help with the itching, but we have to "fail" some certain creams before we can get this approved.   2. Cough - with history of wheezing with COVID19 - Resolved.   3. Return in about 2 months or earlier if needed.   Please inform us of any Emergency Department visits, hospitalizations, or changes in symptoms. Call us before going to the ED for breathing or allergy symptoms since we might be able to fit you in for a sick visit. Feel free to contact us anytime with any questions, problems, or concerns.  It was a pleasure to see you again today!  Websites that have reliable patient information: 1. American Academy of Asthma, Allergy, and Immunology: www.aaaai.org 2. Food Allergy Research and Education (FARE): foodallergy.org 3. Mothers of Asthmatics: http://www.asthmacommunitynetwork.org 4. American College of Allergy, Asthma, and Immunology: www.acaai.org   COVID-19 Vaccine Information can be found at: PodExchange.nl For questions related to vaccine distribution or appointments, please email [email or web address] or call [phone].     "Like" [place] on Facebook and Instagram for our latest updates!      Make sure you are registered to vote! If you have moved or changed any of your contact information, you will need to get this updated before voting!  In some cases, you  MAY be able to register to vote online: [email or web address]

## 2023-01-06 ENCOUNTER — Ambulatory Visit: Payer: 59 | Admitting: Internal Medicine

## 2023-01-06 ENCOUNTER — Encounter: Payer: Self-pay | Admitting: Internal Medicine

## 2023-01-06 VITALS — BP 130/74 | HR 90 | Ht 62.0 in | Wt 239.0 lb

## 2023-01-06 DIAGNOSIS — E1169 Type 2 diabetes mellitus with other specified complication: Secondary | ICD-10-CM

## 2023-01-06 DIAGNOSIS — E785 Hyperlipidemia, unspecified: Secondary | ICD-10-CM | POA: Diagnosis not present

## 2023-01-06 DIAGNOSIS — Z7985 Long-term (current) use of injectable non-insulin antidiabetic drugs: Secondary | ICD-10-CM | POA: Diagnosis not present

## 2023-01-06 DIAGNOSIS — Z7984 Long term (current) use of oral hypoglycemic drugs: Secondary | ICD-10-CM

## 2023-01-06 LAB — ALLERGENS W/COMP RFLX AREA 2

## 2023-01-06 LAB — PROTEIN ELECTROPHORESIS, SERUM, WITH REFLEX

## 2023-01-06 LAB — SEDIMENTATION RATE: Sed Rate: 32 mm/hr (ref 0–40)

## 2023-01-06 LAB — IGE NUT PROF. W/COMPONENT RFLX

## 2023-01-06 NOTE — Progress Notes (Signed)
Name: Jill [name]  Age/ Sex: 62 y.o., female   MRN/ DOB: [id number], October 23, 1960     PCP: Pincus Sanes, MD   Reason for Endocrinology Evaluation: Type 2 Diabetes Mellitus/ Hypercalcemia   Initial Endocrine Consultative Visit: 06/25/2020    PATIENT IDENTIFIER: Jill [name] is a 62 y.o. female with a past medical history of T2DM, HTN, Ashtma and Primary Hyperparathyroidism ( S/P parathyroidectomy 2022). The patient has followed with Endocrinology clinic since 06/25/2020 for consultative assistance with management of her diabetes.  DIABETIC HISTORY:  Jill [name] was diagnosed with DM > 30 yrs ago. Ozempic caused GI side effects. No reported intolerance to Trajenta .She was started on Glipizide and Jardiance in 08/2020 Her hemoglobin A1c has ranged from  6.7% in 2019 to 11.7% in 2021   Glipizide was stopped by PCP , we restarted at smaller dose in 12/2020   We stopped Kombiglyze and switch Jardiance to [place] by April 2023 Started Trulicity April 2023, this was eventually switched to [facility] in 2023  Glipizide stopped 03/2022 with an A1c of 6.7%   Glimepiride started 09/2022 with an A1c of 9.4%  Hyperparathyroid History  The patient was first diagnosed with hypercalcemia in 2015, with maximum serum calcium level of  level 11.2 mg/dL ( Non corrected)    Her 24- hr urinary Ca/Cr ratio of 0.012 with a 191 mg of 24-hr calcium (06/2020) DXA (10/06/2020) low bone density    She met surgical criteria  due to low GFR  She is S/P left inferior parathyroidectomy 05/24/2021  SUBJECTIVE:   During the last visit (09/27/2022): A1c 6.7 %   Today (01/06/2023): Jill [name]  She checks her blood sugars multiple  times daily, through the CGM. The patient has not  had hypoglycemic episodes since the last clinic visit.    She has not taken her meds in 3 weeks, she retired recently and did not have the funds    Has noted constipation over the past week  Denies nausea or vomiting   Noted LE swelling    HOME DIABETES REGIMEN:  -Glimepiride 1 mg daily -Metformin 750 mg BID  -Jardiance 25 daily -Mounjaro 5 mg weekly  -Rosuvastatin 5 mg daily      Statin: yes ACE-I/ARB: yes    CONTINUOUS GLUCOSE MONITORING RECORD INTERPRETATION    Dates of Recording:5/17-5/30/2024  Sensor description:freestyle libre  Results statistics:   CGM use % of time 94  Average and SD 166/28.3  Time in range 33  % Time Above 180 5  % Time above 250 0  % Time Below target 0   Glycemic patterns summary: Optimal BG's through night and mots of the day, increase at supper   Hyperglycemic episodes  postprandial   Hypoglycemic episodes occurred na  Overnight periods: optimal     DIABETIC COMPLICATIONS: Microvascular complications:  CKD III Denies: Neuropathy, retinopathy Last Eye Exam: Completed 03/2021  Macrovascular complications:   Denies: CAD, CVA, PVD   HISTORY:  Past Medical History:  Past Medical History:  Diagnosis Date   Allergy    Anemia    Arthritis    Asthma    8yrs ago   Chronic deafness of both ears    left ear has 40% hearing-right ear has 60 % hearing   Colon polyps    Complication of anesthesia    Hard to wake up   Constipation    Diabetes mellitus without complication (HCC)    Heartburn    High cholesterol  Migraines    none in last 11 yrs   Obesity    Pelvic kidney    left   Pneumonia    Post partum depression    Urinary incontinence    Past Surgical History:  Past Surgical History:  Procedure Laterality Date   CESAREAN SECTION     COLONOSCOPY WITH PROPOFOL N/A 03/24/2015   Procedure: COLONOSCOPY WITH PROPOFOL;  Surgeon: Charna Elizabeth, MD;  Location: [facility];  Service: Endoscopy;  Laterality: N/A;   HEMORRHOID BANDING  08/2017, 09/2017   KNEE ARTHROSCOPY WITH MEDIAL MENISECTOMY Left 04/17/2014   Procedure: LEFT KNEE ARTHROSCOPY WITH PARTIAL MEDIAL MENISCECTOMY AND CHONDROPLASTY;  Surgeon: Javier Docker, MD;  Location:  [facility];  Service: Orthopedics;  Laterality: Left;   PARATHYROIDECTOMY Left 05/24/2021   Procedure: LEFT INFERIOR PARATHYROIDECTOMY, POSSIBLE NECK EXPLORATION;  Surgeon: Darnell Level, MD;  Location: [facility];  Service: General;  Laterality: Left;   TYMPANOSTOMY TUBE PLACEMENT Right    Social History:  reports that she has never smoked. She has never used smokeless tobacco. She reports that she does not currently use alcohol. She reports that she does not use drugs. Family History:  Family History  Problem Relation Age of Onset   Hypertension Mother    Arthritis Mother    Hypercalcemia Mother    Obesity Mother    Allergic rhinitis Mother    Asthma Mother    Hypertension Sister    Deep vein thrombosis Sister    Pulmonary embolism Sister    Asthma Daughter    Diabetes Maternal Grandmother    Heart failure Maternal Grandmother    Heart disease Maternal Grandmother    Hyperlipidemia Maternal Grandmother    Hypertension Maternal Grandmother    Hypertension Maternal Grandfather    Hyperlipidemia Maternal Grandfather    Breast cancer Paternal Aunt        diagnosed in her 1's   Breast cancer Paternal Grandmother      HOME MEDICATIONS: Allergies as of 01/06/2023       Reactions   Linzess [linaclotide] Other (See Comments)   Abdominal pain    Pravastatin Other (See Comments)   Body aches   Adhesive [tape]    Pulls skin off   Atorvastatin    Muscle aches, stomach upset   Crestor [rosuvastatin]    Stomach pain   Latex Itching   Ozempic (0.25 Or 0.5 Mg-dose) [semaglutide(0.25 Or 0.5mg -dos)] Nausea And Vomiting   Stomach pain   Toradol [ketorolac Tromethamine]    Joint pain and swelling   Tramadol Other (See Comments)   JOINT PAIN        Medication List        Accurate as of Jan 06, 2023  8:15 AM. If you have any questions, ask your nurse or doctor.          CERAVE DAILY MOISTURIZING EX Apply 1 application topically in the morning and at bedtime. Mixed with  triamcinolone cream   cetirizine 10 MG tablet Commonly known as: ZYRTEC TAKE 2 TABLETS (20 MG TOTAL) BY MOUTH DAILY AS NEEDED.   chlorhexidine 0.12 % solution Commonly known as: PERIDEX Use as directed 15 mLs in the mouth or throat in the morning and at bedtime.   empagliflozin 25 MG Tabs tablet Commonly known as: Jardiance Take 1 tablet (25 mg total) by mouth daily before breakfast.   ezetimibe 10 MG tablet Commonly known as: Zetia Take 1 tablet (10 mg total) by mouth daily.   famotidine 20 MG tablet Commonly  known as: Pepcid Take 1 tablet (20 mg total) by mouth 2 (two) times daily.   fluticasone 50 MCG/ACT nasal spray Commonly known as: FLONASE Place 2 sprays into both nostrils daily.   FreeStyle Libre 3 Sensor Misc 1 Device by Does not apply route every 14 (fourteen) days. Place 1 sensor on the skin every 14 days. Use to check glucose continuously   glimepiride 1 MG tablet Commonly known as: AMARYL Take 1 tablet (1 mg total) by mouth daily with breakfast.   levocetirizine 5 MG tablet Commonly known as: XYZAL Take 1 tablet (5 mg total) by mouth every evening.   losartan 25 MG tablet Commonly known as: COZAAR Take 1 tablet (25 mg total) by mouth daily.   metFORMIN 750 MG 24 hr tablet Commonly known as: GLUCOPHAGE-XR Take 1 tablet (750 mg total) by mouth in the morning and at bedtime.   montelukast 10 MG tablet Commonly known as: Singulair Take 1 tablet (10 mg total) by mouth at bedtime.   olopatadine 0.1 % ophthalmic solution Commonly known as: PATANOL Place 1 drop into both eyes 2 (two) times daily.   pimecrolimus 1 % cream Commonly known as: Elidel Apply topically 2 (two) times daily.   tirzepatide 5 MG/0.5ML Pen Commonly known as: MOUNJARO Inject 5 mg into the skin once a week.   triamcinolone ointment 0.1 % Commonly known as: KENALOG Apply 1 Application topically 2 (two) times daily.   Vitamin D3 50 MCG (2000 UT) Tabs Take 2,000 Units by mouth in  the morning.         OBJECTIVE:   Vital Signs: BP 130/74 (BP Location: Left Arm, Patient Position: Sitting, Cuff Size: Large)   Pulse 90   Ht 5\' 2"  (1.575 m)   Wt 239 lb (108.4 kg)   SpO2 98%   BMI 43.71 kg/m   Wt Readings from Last 3 Encounters:  01/06/23 239 lb (108.4 kg)  01/05/23 240 lb 9.6 oz (109.1 kg)  12/23/22 237 lb (107.5 kg)     Exam: General: Pt appears well and is in NAD  Lungs: Clear with good BS bilat   Heart: RRR   Extremities: Trace pretibial edema.   Neuro: MS is good with appropriate affect, pt is alert and Ox3     DM Foot Exam 09/27/2022  The skin of the feet is intact without sores or ulcerations. The pedal pulses are 2+ on right and 2+ on left. The sensation is intact to a screening 5.07, 10 gram monofilament bilaterally     DATA REVIEWED:  Lab Results  Component Value Date   HGBA1C 6.1 12/23/2022   HGBA1C 9.6 Repeated and verified X2. (H) 09/27/2022   HGBA1C 9.4 (A) 09/27/2022    Latest Reference Range & Units 12/23/22 10:59  Sodium 135 - 145 mEq/L 140  Potassium 3.5 - 5.1 mEq/L 4.4  Chloride 96 - 112 mEq/L 103  CO2 19 - 32 mEq/L 30  Glucose 70 - 99 mg/dL 161 (H)  BUN 6 - 23 mg/dL 10  Creatinine 0.96 - 0.45 mg/dL 4.09  Calcium 8.6 - 81.1 mg/dL 8.4 - 91.4 mg/dL 9.7 9.7  Alkaline Phosphatase 39 - 117 U/L 74  Albumin 3.5 - 5.2 g/dL 4.2  AST 0 - 37 U/L 16  ALT 0 - 35 U/L 24  Total Protein 6.0 - 8.3 g/dL 7.7  Total Bilirubin 0.2 - 1.2 mg/dL 0.5  GFR >78.29 mL/min 61.88     Latest Reference Range & Units 12/23/22 10:59  Total CHOL/HDL  Ratio  4  Cholesterol 0 - 200 mg/dL 409  HDL Cholesterol >81.19 mg/dL 14.78  LDL (calc) 0 - 99 mg/dL 295 (H)  MICROALB/CREAT RATIO 0.0 - 30.0 mg/g 0.9  NonHDL  149.16  Triglycerides 0.0 - 149.0 mg/dL 62.1  VLDL 0.0 - 30.8 mg/dL 65.7  (H): Data is abnormally high    ASSESSMENT / PLAN / RECOMMENDATIONS:   1) Type 2 Diabetes Mellitus, Optimally  controlled, Without complications - Most  recent A1c of 6.1 %. Goal A1c < 7.0 %.    -Her A1c is optimized on the current regimen but unfortunately for the past 3 weeks she has not been able to get her medications due to changes in financial status after retirement -Patient is working on her budget at this time, and will take her medications as soon as possible -No changes at this time   MEDICATIONS: - [place] Glimepiride 1 mg, 1 tablet before breakfast - Continue metformin 750 mg twice daily - Continue Jardiance 25 mg daily - Continue Mounjaro 5 mg weekly    EDUCATION / INSTRUCTIONS: BG monitoring instructions: Patient is instructed to check her blood sugars 3  times a day, before meals . Call [facility] if: BG persistently < 70 I reviewed the Rule of 15 for the treatment of hypoglycemia in detail with the patient. Literature supplied.  2) Diabetic complications:  Eye: Does not have known diabetic retinopathy.  Neuro/ Feet: Does not have known diabetic peripheral neuropathy .  Renal: Patient does  have known baseline CKD. She   is not on an ACEI/ARB at present.      3) Dyslipidemia:   -She has reported prior  intolerance to daily rosuvastatin and atorvastatin in the past   - We stopped Pravastatin and started weekly Rosuvastatin 12/2020 -Following parathyroidectomy, the patient has noted resolution of joint aches and pains and increased rosuvastatin to daily without any side effects 2024  -LDL has increased from 65  to 137 mg/dL, the patient has been without rosuvastatin for approximately 2 weeks prior to the last checkup -Her PCP had added Zetia, in the past this has been cost prohibitive for the patient, she will try to pick it up from the pharmacy once her budget has been figured out  Continue Rosuvastatin 5 mg daily      F/U in 6 months     Signed electronically by: Lyndle Herrlich, MD  [facility]  [facility] [address] [place] [place], [place]  [address] Phone: [phone] FAX: [phone]   CC: Pincus Sanes, MD [address] [place] [address] Phone: [phone]  Fax: [phone]  Return to Endocrinology clinic as below: Future Appointments  Date Time Provider Department Center  03/07/2023  3:30 PM Alfonse Spruce, MD [facility] None  06/26/2023 10:00 AM Pincus Sanes, MD [facility] None

## 2023-01-06 NOTE — Patient Instructions (Signed)
-   Continue Glimepiride 1 mg daily  - Continue Metformin 750 mg twice daily  - Continue  Jardiance 25 mg daily  - Continue Mounjaro 5 mg weekly      HOW TO TREAT LOW BLOOD SUGARS (Blood sugar LESS THAN 70 MG/DL) Please follow the RULE OF 15 for the treatment of hypoglycemia treatment (when your (blood sugars are less than 70 mg/dL)   STEP 1: Take 15 grams of carbohydrates when your blood sugar is low, which includes:  3-4 GLUCOSE TABS  OR 3-4 OZ OF JUICE OR REGULAR SODA OR ONE TUBE OF GLUCOSE GEL    STEP 2: RECHECK blood sugar in 15 MINUTES STEP 3: If your blood sugar is still low at the 15 minute recheck --> then, go back to STEP 1 and treat AGAIN with another 15 grams of carbohydrates. Stop Jardiance stop Jardiance start Synjardy 12 point 12-998, 2 tabs daily Synjardy 12.12-998 mg,

## 2023-01-07 LAB — ALLERGENS W/COMP RFLX AREA 2

## 2023-01-07 LAB — ALLERGEN COCONUT IGE: Allergen Coconut IgE: <0.1 kU/L

## 2023-01-07 LAB — PROTEIN ELECTROPHORESIS, SERUM, WITH REFLEX

## 2023-01-07 LAB — IGE NUT PROF. W/COMPONENT RFLX

## 2023-01-08 LAB — ALLERGENS W/COMP RFLX AREA 2
Cockroach, German IgE: <0.1 kU/L
Common Silver Birch IgE: <0.1 kU/L
Pigweed, Rough IgE: <0.1 kU/L
Sheep Sorrel IgE Qn: <0.1 kU/L
Timothy Grass IgE: <0.1 kU/L

## 2023-01-08 LAB — ANTINUCLEAR ANTIBODIES, IFA

## 2023-01-08 LAB — TRYPTASE: Tryptase: 7.2 ug/L (ref 2.2–13.2)

## 2023-01-09 ENCOUNTER — Encounter: Payer: Self-pay | Admitting: Allergy & Immunology

## 2023-01-09 LAB — IGE NUT PROF. W/COMPONENT RFLX
F018-IgE Brazil Nut: <0.1 kU/L
F020-IgE Almond: <0.1 kU/L
F202-IgE Cashew Nut: <0.1 kU/L
Macadamia Nut, IgE: <0.1 kU/L

## 2023-01-09 LAB — ALLERGENS W/COMP RFLX AREA 2
Cladosporium Herbarum IgE: <0.1 kU/L
D Farinae IgE: <0.1 kU/L
IgE (Immunoglobulin E), Serum: 30 IU/mL (ref 6–495)

## 2023-01-09 LAB — C-REACTIVE PROTEIN: CRP: 24 mg/L — ABNORMAL HIGH (ref 0–10)

## 2023-01-09 LAB — PROTEIN ELECTROPHORESIS, SERUM, WITH REFLEX

## 2023-01-12 ENCOUNTER — Encounter: Payer: Self-pay | Admitting: Internal Medicine

## 2023-01-12 LAB — PROTEIN ELECTROPHORESIS, SERUM, WITH REFLEX
A/G Ratio: 1.1 (ref 0.7–1.7)
Alpha 1: 0.3 g/dL (ref 0.0–0.4)
Beta: 1.3 g/dL (ref 0.7–1.3)
Globulin, Total: 3.4 g/dL (ref 2.2–3.9)

## 2023-01-12 LAB — IGE NUT PROF. W/COMPONENT RFLX
F017-IgE Hazelnut (Filbert): <0.1 kU/L
F203-IgE Pistachio Nut: <0.1 kU/L

## 2023-01-12 LAB — ALLERGENS W/COMP RFLX AREA 2
Bermuda Grass IgE: <0.1 kU/L
D Pteronyssinus IgE: <0.1 kU/L
E001-IgE Cat Dander: <0.1 kU/L
E005-IgE Dog Dander: <0.1 kU/L
Elm, American IgE: <0.1 kU/L
Oak, White IgE: <0.1 kU/L

## 2023-01-23 NOTE — Progress Notes (Signed)
I called to move up her follow up visit and she stated she wanted to wait till the end of August. She states she hasn't been able to start on any of the medications that we sent in. She doesn't have the money to pay for everything until her pay day on next Tuesday. She is going to call us next week to have all of her medications changed over to [facility] pharmacy as it is about $200 cheaper than the local pharmacy. Patient states she is doing fine at this time & will call if she needs to be seen sooner.

## 2023-01-31 ENCOUNTER — Encounter: Payer: Self-pay | Admitting: Internal Medicine

## 2023-01-31 ENCOUNTER — Encounter: Payer: Self-pay | Admitting: Allergy & Immunology

## 2023-02-01 ENCOUNTER — Other Ambulatory Visit: Payer: Self-pay

## 2023-02-01 MED ORDER — MONTELUKAST SODIUM 10 MG PO TABS: 10.0000 mg | ORAL_TABLET | Freq: Every day | ORAL | 0 refills | Status: DC

## 2023-02-01 MED ORDER — LEVOCETIRIZINE DIHYDROCHLORIDE 5 MG PO TABS: 5.0000 mg | ORAL_TABLET | Freq: Every evening | ORAL | 1 refills | Status: DC

## 2023-02-01 MED ORDER — FREESTYLE LIBRE 3 SENSOR MISC: 1.0000 | 3 refills | Status: DC

## 2023-02-01 MED ORDER — TIRZEPATIDE 5 MG/0.5ML ~~LOC~~ SOAJ: 5.0000 mg | SUBCUTANEOUS | 0 refills | Status: DC

## 2023-02-01 MED ORDER — FAMOTIDINE 20 MG PO TABS: 20.0000 mg | ORAL_TABLET | Freq: Two times a day (BID) | ORAL | 0 refills | Status: DC

## 2023-02-01 MED ORDER — GLIMEPIRIDE 1 MG PO TABS: 1.0000 mg | ORAL_TABLET | Freq: Every day | ORAL | 1 refills | Status: DC

## 2023-02-01 MED ORDER — METFORMIN HCL ER 750 MG PO TB24: 750.0000 mg | ORAL_TABLET | Freq: Two times a day (BID) | ORAL | 1 refills | Status: DC

## 2023-02-01 MED ORDER — EMPAGLIFLOZIN 25 MG PO TABS: 25.0000 mg | ORAL_TABLET | Freq: Every day | ORAL | 1 refills | Status: DC

## 2023-02-02 MED ORDER — ROSUVASTATIN CALCIUM 5 MG PO TABS: 5.0000 mg | ORAL_TABLET | Freq: Every day | ORAL | 3 refills | Status: DC

## 2023-02-02 MED ORDER — EZETIMIBE 10 MG PO TABS: 10.0000 mg | ORAL_TABLET | Freq: Every day | ORAL | 1 refills | Status: DC

## 2023-02-02 MED ORDER — LOSARTAN POTASSIUM 25 MG PO TABS: 25.0000 mg | ORAL_TABLET | Freq: Every day | ORAL | 1 refills | Status: DC

## 2023-02-02 MED ORDER — TRIAMCINOLONE ACETONIDE 0.1 % EX OINT: 1.0000 | TOPICAL_OINTMENT | Freq: Two times a day (BID) | CUTANEOUS | 3 refills | Status: DC

## 2023-02-02 NOTE — Addendum Note (Signed)
Addended by: Pincus Sanes on: 02/02/2023 05:26 PM   Modules accepted: Orders

## 2023-02-06 ENCOUNTER — Telehealth: Payer: Self-pay

## 2023-02-06 DIAGNOSIS — E1169 Type 2 diabetes mellitus with other specified complication: Secondary | ICD-10-CM

## 2023-02-06 MED ORDER — METFORMIN HCL ER 750 MG PO TB24
750.0000 mg | ORAL_TABLET | Freq: Two times a day (BID) | ORAL | 1 refills | Status: DC
Start: 2023-02-06 — End: 2023-08-14

## 2023-02-06 NOTE — Telephone Encounter (Signed)
Ebenezer Mccaskey Ann Aja Bolander, CMA  ?

## 2023-03-07 ENCOUNTER — Ambulatory Visit: Payer: 59 | Admitting: Allergy & Immunology

## 2023-03-17 ENCOUNTER — Other Ambulatory Visit: Payer: Self-pay | Admitting: Allergy & Immunology

## 2023-03-30 ENCOUNTER — Ambulatory Visit: Payer: 59 | Admitting: Allergy & Immunology

## 2023-04-05 ENCOUNTER — Other Ambulatory Visit: Payer: Self-pay | Admitting: Internal Medicine

## 2023-04-09 ENCOUNTER — Other Ambulatory Visit: Payer: Self-pay | Admitting: Allergy & Immunology

## 2023-04-10 ENCOUNTER — Other Ambulatory Visit: Payer: Self-pay | Admitting: Internal Medicine

## 2023-05-02 NOTE — Progress Notes (Unsigned)
Subjective:    Patient ID: Jill [name], female    DOB: 1961-01-14, 62 y.o.   MRN: [id number]     HPI Michalina is here for follow up of her chronic medical problems.  Check A1c, lipid  Elevated crp in 12/2022, esr normal, ANA normal.   Medications and allergies reviewed with patient and updated if appropriate.  Current Outpatient Medications on File Prior to Visit  Medication Sig Dispense Refill   cetirizine (ZYRTEC) 10 MG tablet TAKE 2 TABLETS (20 MG TOTAL) BY MOUTH DAILY AS NEEDED. 180 tablet 0   chlorhexidine (PERIDEX) 0.12 % solution Use as directed 15 mLs in the mouth or throat in the morning and at bedtime.     Cholecalciferol (VITAMIN D3) 50 MCG (2000 UT) TABS Take 2,000 Units by mouth in the morning.     Continuous Glucose Sensor (FREESTYLE LIBRE 3 SENSOR) MISC 1 Device by Does not apply route every 14 (fourteen) days. Place 1 sensor on the skin every 14 days. Use to check glucose continuously 6 each 3   Emollient (CERAVE DAILY MOISTURIZING EX) Apply 1 application topically in the morning and at bedtime. Mixed with triamcinolone cream     empagliflozin (JARDIANCE) 25 MG TABS tablet Take 1 tablet (25 mg total) by mouth daily before breakfast. 90 tablet 1   ezetimibe (ZETIA) 10 MG tablet Take 1 tablet (10 mg total) by mouth daily. 90 tablet 1   famotidine (PEPCID) 20 MG tablet TAKE 1 TABLET BY MOUTH TWICE  DAILY 120 tablet 5   fluticasone (FLONASE) 50 MCG/ACT nasal spray Place 2 sprays into both nostrils daily. (Patient not taking: Reported on 01/06/2023)     glimepiride (AMARYL) 1 MG tablet TAKE 1 TABLET BY MOUTH DAILY  WITH BREAKFAST 90 tablet 1   levocetirizine (XYZAL) 5 MG tablet Take 1 tablet (5 mg total) by mouth every evening. 90 tablet 1   losartan (COZAAR) 25 MG tablet Take 1 tablet (25 mg total) by mouth daily. 90 tablet 1   metFORMIN (GLUCOPHAGE-XR) 750 MG 24 hr tablet Take 1 tablet (750 mg total) by mouth in the morning and at bedtime. 180 tablet 1    montelukast (SINGULAIR) 10 MG tablet TAKE 1 TABLET BY MOUTH AT  BEDTIME 90 tablet 3   MOUNJARO 5 MG/0.5ML Pen INJECT THE CONTENTS OF ONE PEN  SUBCUTANEOUSLY WEEKLY AS  DIRECTED 6 mL 3   olopatadine (PATANOL) 0.1 % ophthalmic solution Place 1 drop into both eyes 2 (two) times daily. 5 mL 5   pimecrolimus (ELIDEL) 1 % cream Apply topically 2 (two) times daily. 30 g 5   rosuvastatin (CRESTOR) 5 MG tablet Take 1 tablet (5 mg total) by mouth daily. 90 tablet 3   triamcinolone ointment (KENALOG) 0.1 % Apply 1 Application topically 2 (two) times daily. 454 g 3   No current facility-administered medications on file prior to visit.     Review of Systems     Objective:  There were no vitals filed for this visit. BP Readings from Last 3 Encounters:  01/06/23 130/74  01/05/23 (!) 148/86  12/23/22 124/72   Wt Readings from Last 3 Encounters:  01/06/23 239 lb (108.4 kg)  01/05/23 240 lb 9.6 oz (109.1 kg)  12/23/22 237 lb (107.5 kg)   There is no height or weight on file to calculate BMI.    Physical Exam     Lab Results  Component Value Date   WBC 5.0 12/23/2022   HGB 13.7  12/23/2022   HCT 41.0 12/23/2022   PLT 232.0 12/23/2022   GLUCOSE 144 (H) 12/23/2022   CHOL 198 12/23/2022   TRIG 61.0 12/23/2022   HDL 49.30 12/23/2022   LDLDIRECT 125 (H) 09/10/2014   LDLCALC 137 (H) 12/23/2022   ALT 24 12/23/2022   AST 16 12/23/2022   NA 140 12/23/2022   K 4.4 12/23/2022   CL 103 12/23/2022   CREATININE 0.99 12/23/2022   BUN 10 12/23/2022   CO2 30 12/23/2022   TSH 1.29 12/15/2020   HGBA1C 6.1 12/23/2022   MICROALBUR 1.7 12/23/2022     Assessment & Plan:    See Problem List for Assessment and Plan of chronic medical problems.

## 2023-05-02 NOTE — Patient Instructions (Signed)
      Blood work was ordered.   The lab is on the first floor.    Medications changes include :       A referral was ordered and someone will call you to schedule an appointment.     No follow-ups on file.  

## 2023-05-03 ENCOUNTER — Other Ambulatory Visit: Payer: Self-pay | Admitting: Internal Medicine

## 2023-05-03 ENCOUNTER — Ambulatory Visit: Payer: 59 | Admitting: Internal Medicine

## 2023-05-03 VITALS — BP 132/70 | HR 83 | Temp 98.1°F | Ht 62.0 in | Wt 224.0 lb

## 2023-05-03 DIAGNOSIS — E782 Mixed hyperlipidemia: Secondary | ICD-10-CM | POA: Diagnosis not present

## 2023-05-03 DIAGNOSIS — H90A31 Mixed conductive and sensorineural hearing loss, unilateral, right ear with restricted hearing on the contralateral side: Secondary | ICD-10-CM

## 2023-05-03 DIAGNOSIS — E1165 Type 2 diabetes mellitus with hyperglycemia: Secondary | ICD-10-CM

## 2023-05-03 DIAGNOSIS — Z6841 Body Mass Index (BMI) 40.0 and over, adult: Secondary | ICD-10-CM

## 2023-05-03 DIAGNOSIS — I10 Essential (primary) hypertension: Secondary | ICD-10-CM

## 2023-05-03 DIAGNOSIS — M2550 Pain in unspecified joint: Secondary | ICD-10-CM

## 2023-05-03 DIAGNOSIS — Z7984 Long term (current) use of oral hypoglycemic drugs: Secondary | ICD-10-CM

## 2023-05-03 LAB — LIPID PANEL
Cholesterol: 107 mg/dL (ref 0–200)
HDL: 44.7 mg/dL (ref >39.00)
LDL Cholesterol: 51 mg/dL (ref 0–99)
NonHDL: 61.85
Total CHOL/HDL Ratio: 2
Triglycerides: 56 mg/dL (ref 0.0–149.0)
VLDL: 11.2 mg/dL (ref 0.0–40.0)

## 2023-05-03 LAB — COMPREHENSIVE METABOLIC PANEL
ALT: 14 U/L (ref 0–35)
AST: 12 U/L (ref 0–37)
Albumin: 4.3 g/dL (ref 3.5–5.2)
Alkaline Phosphatase: 80 U/L (ref 39–117)
BUN: 13 mg/dL (ref 6–23)
CO2: 27 mEq/L (ref 19–32)
Calcium: 9.4 mg/dL (ref 8.4–10.5)
Chloride: 104 mEq/L (ref 96–112)
Creatinine, Ser: 1.07 mg/dL (ref 0.40–1.20)
GFR: 56.23 mL/min — ABNORMAL LOW (ref >60.00)
Glucose, Bld: 123 mg/dL — ABNORMAL HIGH (ref 70–99)
Potassium: 4.4 mEq/L (ref 3.5–5.1)
Sodium: 140 mEq/L (ref 135–145)
Total Bilirubin: 0.5 mg/dL (ref 0.2–1.2)
Total Protein: 7.7 g/dL (ref 6.0–8.3)

## 2023-05-03 LAB — HEMOGLOBIN A1C: Hgb A1c MFr Bld: 6.3 % (ref 4.6–6.5)

## 2023-05-03 MED ORDER — LANCET DEVICE MISC: 1.0000 | Freq: Three times a day (TID) | 0 refills | Status: AC

## 2023-05-03 MED ORDER — BLOOD GLUCOSE TEST VI STRP: 1.0000 | ORAL_STRIP | Freq: Three times a day (TID) | 0 refills | Status: DC

## 2023-05-03 MED ORDER — BLOOD GLUCOSE MONITORING SUPPL DEVI: 1.0000 | Freq: Three times a day (TID) | 0 refills | Status: AC

## 2023-05-03 MED ORDER — LANCETS MISC. MISC: 1.0000 | Freq: Three times a day (TID) | 0 refills | Status: AC

## 2023-05-03 NOTE — Assessment & Plan Note (Signed)
Chronic BP well controlled Continue losartan 25 mg daily cmp

## 2023-05-03 NOTE — Assessment & Plan Note (Signed)
Chronic   Lab Results  Component Value Date   HGBA1C 6.1 12/23/2022   Sugars controlled Testing sugars 1 times a day Check A1c Following with endocrine Continue Jardiance 25 mg daily, glimepiride 1 mg daily, metformin 750 mg twice daily, Mounjaro 5 mg daily Discussed exercise, diabetic diet

## 2023-05-03 NOTE — Assessment & Plan Note (Signed)
Chronic OA of knees and ankles Crp recently elevated - discussed that this is nonspecific Autoimmune labs in past have been negative Joint pain is related to OA Discussed treatment No further evaluation needed at this time

## 2023-05-03 NOTE — Assessment & Plan Note (Signed)
Chronic Needs hearing aids but not covered by insurance - brought a form but she is not eligible for medical disability She will look into other options to help with payment of the hearing aids

## 2023-05-03 NOTE — Assessment & Plan Note (Signed)
Chronic Has lost some weight She is doing some stretching-stressed the importance of regular exercise to help control the sugars Diabetic diet, small portions

## 2023-05-03 NOTE — Assessment & Plan Note (Addendum)
Chronic Check lipid panel Continue crestor 5 mg daily, zetia 10 mg daily Regular exercise and healthy diet encouraged

## 2023-05-11 ENCOUNTER — Encounter: Payer: Self-pay | Admitting: Internal Medicine

## 2023-06-07 ENCOUNTER — Encounter: Payer: Self-pay | Admitting: Internal Medicine

## 2023-06-09 ENCOUNTER — Other Ambulatory Visit: Payer: Self-pay

## 2023-06-11 MED ORDER — DEXCOM G7 SENSOR MISC: 3 refills | Status: DC

## 2023-06-11 MED ORDER — RYBELSUS 3 MG PO TABS: 3.0000 mg | ORAL_TABLET | Freq: Every day | ORAL | 1 refills | Status: DC

## 2023-06-15 ENCOUNTER — Ambulatory Visit: Payer: 59 | Admitting: Allergy & Immunology

## 2023-06-20 ENCOUNTER — Other Ambulatory Visit (HOSPITAL_BASED_OUTPATIENT_CLINIC_OR_DEPARTMENT_OTHER): Payer: Self-pay | Admitting: Internal Medicine

## 2023-06-20 DIAGNOSIS — Z1231 Encounter for screening mammogram for malignant neoplasm of breast: Secondary | ICD-10-CM

## 2023-06-26 ENCOUNTER — Ambulatory Visit: Payer: 59 | Admitting: Internal Medicine

## 2023-06-27 ENCOUNTER — Encounter: Payer: Self-pay | Admitting: Internal Medicine

## 2023-06-27 ENCOUNTER — Ambulatory Visit: Payer: 59 | Admitting: Internal Medicine

## 2023-06-27 VITALS — BP 136/80 | HR 70 | Ht 62.0 in | Wt 233.0 lb

## 2023-06-27 DIAGNOSIS — E785 Hyperlipidemia, unspecified: Secondary | ICD-10-CM | POA: Diagnosis not present

## 2023-06-27 DIAGNOSIS — Z7984 Long term (current) use of oral hypoglycemic drugs: Secondary | ICD-10-CM | POA: Diagnosis not present

## 2023-06-27 DIAGNOSIS — E1169 Type 2 diabetes mellitus with other specified complication: Secondary | ICD-10-CM

## 2023-06-27 MED ORDER — ROSUVASTATIN CALCIUM 10 MG PO TABS: 10.0000 mg | ORAL_TABLET | Freq: Every day | ORAL | 3 refills | Status: DC

## 2023-06-27 NOTE — Patient Instructions (Signed)
-   Continue Glimepiride 1 mg daily  - Continue Metformin 750 mg twice daily  - Continue  Jardiance 25 mg daily   Increase rosuvastatin 10 mg daily, no Zetia  HOW TO TREAT LOW BLOOD SUGARS (Blood sugar LESS THAN 70 MG/DL) Please follow the RULE OF 15 for the treatment of hypoglycemia treatment (when your (blood sugars are less than 70 mg/dL)   STEP 1: Take 15 grams of carbohydrates when your blood sugar is low, which includes:  3-4 GLUCOSE TABS  OR 3-4 OZ OF JUICE OR REGULAR SODA OR ONE TUBE OF GLUCOSE GEL    STEP 2: RECHECK blood sugar in 15 MINUTES STEP 3: If your blood sugar is still low at the 15 minute recheck --> then, go back to STEP 1 and treat AGAIN with another 15 grams of carbohydrates. Stop Jardiance stop Jardiance start Synjardy 12 point 12-998, 2 tabs daily Synjardy 12.12-998 mg,

## 2023-06-27 NOTE — Progress Notes (Signed)
Name: Jill [name]  Age/ Sex: 62 y.o., female   MRN/ DOB: [id number], 1961-04-25     PCP: Pincus Sanes, MD   Reason for Endocrinology Evaluation: Type 2 Diabetes Mellitus/ Hypercalcemia   Initial Endocrine Consultative Visit: 06/25/2020    PATIENT IDENTIFIER: Jill [name] is a 62 y.o. female with a past medical history of T2DM, HTN, Ashtma and Primary Hyperparathyroidism ( S/P parathyroidectomy 2022). The patient has followed with Endocrinology clinic since 06/25/2020 for consultative assistance with management of her diabetes.  DIABETIC HISTORY:  Jill [name] was diagnosed with DM > 30 yrs ago. Ozempic caused GI side effects. No reported intolerance to Trajenta .She was started on Glipizide and Jardiance in 08/2020 Her hemoglobin A1c has ranged from  6.7% in 2019 to 11.7% in 2021   Glipizide was stopped by PCP , we restarted at smaller dose in 12/2020   We stopped Kombiglyze and switch Jardiance to [place] by April 2023 Started Trulicity April 2023, this was eventually switched to [facility] in 2023  Glipizide stopped 03/2022 with an A1c of 6.7%   Glimepiride started 09/2022 with an A1c of 9.4%  Hyperparathyroid History  The patient was first diagnosed with hypercalcemia in 2015, with maximum serum calcium level of  level 11.2 mg/dL ( Non corrected)    Her 24- hr urinary Ca/Cr ratio of 0.012 with a 191 mg of 24-hr calcium (06/2020) DXA (10/06/2020) low bone density    She met surgical criteria  due to low GFR  She is S/P left inferior parathyroidectomy 05/24/2021  SUBJECTIVE:   During the last visit (01/06/2023): A1c 6.1 %     Today (06/27/2023): Jill [name]  She checks her blood sugars multiple  times daily, through the CGM. The patient has had hypoglycemic episodes since the last clinic visit, she is asymptomatic and questions the accuracy   She stopped mounjaro as she does not want give herself an injection, but she also Jardiance due to cost issues  She started  using a rybeslsus sample that she had previously  She was without Glimepiride for 2 weeks   Denies nausea or vomiting     HOME DIABETES REGIMEN:  -Glimepiride 1 mg daily -Metformin 750 mg BID  -Jardiance 25 daily -Mounjaro 5 mg weekly - not taking  -Rosuvastatin 5 mg daily - not taking      Statin: yes ACE-I/ARB: yes    CONTINUOUS GLUCOSE MONITORING RECORD INTERPRETATION    Dates of Recording:11/6-11/19/2024  Sensor description:dexcom  Results statistics:   CGM use % of time 57  Average and SD 125/36  Time in range 87  % Time Above 180 8  % Time above 250 0  % Time Below target 3   Glycemic patterns summary: Optimal BG's through night and day  Hyperglycemic episodes  postprandial   Hypoglycemic episodes occurred during the day  Overnight periods: optimal     DIABETIC COMPLICATIONS: Microvascular complications:  CKD III Denies: Neuropathy, retinopathy Last Eye Exam: Completed 03/2021  Macrovascular complications:   Denies: CAD, CVA, PVD   HISTORY:  Past Medical History:  Past Medical History:  Diagnosis Date   Allergy    Anemia    Arthritis    Asthma    28yrs ago   Chronic deafness of both ears    left ear has 40% hearing-right ear has 60 % hearing   Colon polyps    Complication of anesthesia    Hard to wake up   Constipation    Diabetes mellitus  without complication (HCC)    Heartburn    High cholesterol    Migraines    none in last 11 yrs   Obesity    Pelvic kidney    left   Pneumonia    Post partum depression    Urinary incontinence    Past Surgical History:  Past Surgical History:  Procedure Laterality Date   CESAREAN SECTION     COLONOSCOPY WITH PROPOFOL N/A 03/24/2015   Procedure: COLONOSCOPY WITH PROPOFOL;  Surgeon: Charna Elizabeth, MD;  Location: [facility];  Service: Endoscopy;  Laterality: N/A;   HEMORRHOID BANDING  08/2017, 09/2017   KNEE ARTHROSCOPY WITH MEDIAL MENISECTOMY Left 04/17/2014   Procedure: LEFT KNEE  ARTHROSCOPY WITH PARTIAL MEDIAL MENISCECTOMY AND CHONDROPLASTY;  Surgeon: Javier Docker, MD;  Location: [facility];  Service: Orthopedics;  Laterality: Left;   PARATHYROIDECTOMY Left 05/24/2021   Procedure: LEFT INFERIOR PARATHYROIDECTOMY, POSSIBLE NECK EXPLORATION;  Surgeon: Darnell Level, MD;  Location: [facility];  Service: General;  Laterality: Left;   TYMPANOSTOMY TUBE PLACEMENT Right    Social History:  reports that she has never smoked. She has never used smokeless tobacco. She reports that she does not currently use alcohol. She reports that she does not use drugs. Family History:  Family History  Problem Relation Age of Onset   Hypertension Mother    Arthritis Mother    Hypercalcemia Mother    Obesity Mother    Allergic rhinitis Mother    Asthma Mother    Hypertension Sister    Deep vein thrombosis Sister    Pulmonary embolism Sister    Asthma Daughter    Diabetes Maternal Grandmother    Heart failure Maternal Grandmother    Heart disease Maternal Grandmother    Hyperlipidemia Maternal Grandmother    Hypertension Maternal Grandmother    Hypertension Maternal Grandfather    Hyperlipidemia Maternal Grandfather    Breast cancer Paternal Aunt        diagnosed in her 2's   Breast cancer Paternal Grandmother      HOME MEDICATIONS: Allergies as of 06/27/2023       Reactions   Linzess [linaclotide] Other (See Comments)   Abdominal pain    Pravastatin Other (See Comments)   Body aches   Adhesive [tape]    Pulls skin off   Atorvastatin    Muscle aches, stomach upset   Latex Itching   Toradol [ketorolac Tromethamine]    Joint pain and swelling   Tramadol Other (See Comments)   JOINT PAIN        Medication List        Accurate as of June 27, 2023  1:39 PM. If you have any questions, ask your nurse or doctor.          Blood Glucose Monitoring Suppl Devi 1 each by Does not apply route in the morning, at noon, and at bedtime. May substitute to any manufacturer  covered by patient's insurance. E11.9   CERAVE DAILY MOISTURIZING EX Apply 1 application topically in the morning and at bedtime. Mixed with triamcinolone cream   cetirizine 10 MG tablet Commonly known as: ZYRTEC TAKE 2 TABLETS (20 MG TOTAL) BY MOUTH DAILY AS NEEDED.   chlorhexidine 0.12 % solution Commonly known as: PERIDEX Use as directed 15 mLs in the mouth or throat in the morning and at bedtime.   Dexcom G7 Sensor Misc UAD to monitor sugars  E11.9   empagliflozin 25 MG Tabs tablet Commonly known as: Jardiance Take 1 tablet (25  mg total) by mouth daily before breakfast.   ezetimibe 10 MG tablet Commonly known as: Zetia Take 1 tablet (10 mg total) by mouth daily.   famotidine 20 MG tablet Commonly known as: PEPCID TAKE 1 TABLET BY MOUTH TWICE  DAILY   fluticasone 50 MCG/ACT nasal spray Commonly known as: FLONASE Place 2 sprays into both nostrils daily.   glimepiride 1 MG tablet Commonly known as: AMARYL TAKE 1 TABLET BY MOUTH DAILY  WITH BREAKFAST   Glucose Control Soln UAD.   Training and development officer by insurance.  E11.9   levocetirizine 5 MG tablet Commonly known as: XYZAL Take 1 tablet (5 mg total) by mouth every evening.   losartan 25 MG tablet Commonly known as: COZAAR Take 1 tablet (25 mg total) by mouth daily.   metFORMIN 750 MG 24 hr tablet Commonly known as: GLUCOPHAGE-XR Take 1 tablet (750 mg total) by mouth in the morning and at bedtime.   montelukast 10 MG tablet Commonly known as: SINGULAIR TAKE 1 TABLET BY MOUTH AT  BEDTIME   olopatadine 0.1 % ophthalmic solution Commonly known as: PATANOL Place 1 drop into both eyes 2 (two) times daily.   pimecrolimus 1 % cream Commonly known as: Elidel Apply topically 2 (two) times daily.   rosuvastatin 5 MG tablet Commonly known as: Crestor Take 1 tablet (5 mg total) by mouth daily.   Rybelsus 3 MG Tabs Generic drug: Semaglutide Take 1 tablet (3 mg total) by mouth daily.   triamcinolone ointment 0.1  % Commonly known as: KENALOG Apply 1 Application topically 2 (two) times daily.         OBJECTIVE:   Vital Signs: BP 136/80 (BP Location: Right Arm, Patient Position: Sitting, Cuff Size: Large)   Pulse 70   Ht 5\' 2"  (1.575 m)   Wt 233 lb (105.7 kg)   SpO2 99%   BMI 42.62 kg/m   Wt Readings from Last 3 Encounters:  06/27/23 233 lb (105.7 kg)  05/03/23 224 lb (101.6 kg)  01/06/23 239 lb (108.4 kg)     Exam: General: Pt appears well and is in NAD  Lungs: Clear with good BS bilat   Heart: RRR   Extremities: Trace pretibial edema.   Neuro: MS is good with appropriate affect, pt is alert and Ox3     DM Foot Exam 06/27/2023  The skin of the feet is intact without sores or ulcerations. The pedal pulses are 2+ on right and 2+ on left. The sensation is intact to a screening 5.07, 10 gram monofilament bilaterally     DATA REVIEWED:  Lab Results  Component Value Date   HGBA1C 6.3 05/03/2023   HGBA1C 6.1 12/23/2022   HGBA1C 9.6 Repeated and verified X2. (H) 09/27/2022    Latest Reference Range & Units 05/03/23 08:49  Sodium 135 - 145 mEq/L 140  Potassium 3.5 - 5.1 mEq/L 4.4  Chloride 96 - 112 mEq/L 104  CO2 19 - 32 mEq/L 27  Glucose 70 - 99 mg/dL 846 (H)  BUN 6 - 23 mg/dL 13  Creatinine 9.62 - 9.52 mg/dL 8.41  Calcium 8.4 - 32.4 mg/dL 9.4  Alkaline Phosphatase 39 - 117 U/L 80  Albumin 3.5 - 5.2 g/dL 4.3  AST 0 - 37 U/L 12  ALT 0 - 35 U/L 14  Total Protein 6.0 - 8.3 g/dL 7.7  Total Bilirubin 0.2 - 1.2 mg/dL 0.5  GFR >40.10 mL/min 56.23 (L)    Latest Reference Range & Units 05/03/23 08:49  Total CHOL/HDL  Ratio  2  Cholesterol 0 - 200 mg/dL 409  HDL Cholesterol >81.19 mg/dL 14.78  LDL (calc) 0 - 99 mg/dL 51  NonHDL  29.56  Triglycerides 0.0 - 149.0 mg/dL 21.3  VLDL 0.0 - 08.6 mg/dL 57.8     ASSESSMENT / PLAN / RECOMMENDATIONS:   1) Type 2 Diabetes Mellitus, Optimally  controlled, Without complications - Most recent A1c of 6.3 %. Goal A1c < 7.0 %.     -Patient with social determinants and has not been able to afford all her medications hence she has not been on Jardiance nor Mounjaro -Her A1c is optimal with hypoglycemia, I will continue metformin and glimepiride at this time as she has been switching between glimepiride and Rybelsus -We will hold off on GLP-1 agonist at this time -She was provided with patient assistance forms for Jardiance    MEDICATIONS: - Contine Glimepiride 1 mg, 1 tablet before breakfast - Continue metformin 750 mg twice daily - Continue Jardiance 25 mg daily    EDUCATION / INSTRUCTIONS: BG monitoring instructions: Patient is instructed to check her blood sugars 3  times a day, before meals . Call [facility] if: BG persistently < 70 I reviewed the Rule of 15 for the treatment of hypoglycemia in detail with the patient. Literature supplied.  2) Diabetic complications:  Eye: Does not have known diabetic retinopathy.  Neuro/ Feet: Does not have known diabetic peripheral neuropathy .  Renal: Patient does  have known baseline CKD. She   is not on an ACEI/ARB at present.      3) Dyslipidemia:   -She has reported prior  intolerance to daily rosuvastatin and atorvastatin in the past   - We stopped Pravastatin and started weekly Rosuvastatin 12/2020 -Following parathyroidectomy, the patient has noted resolution of joint aches and pains and increased rosuvastatin to daily without any side effects 2024  -LDL has increased from 65  to 137 mg/dL in May 2024, PCP started her on Zetia, but this has been cost prohibitive -I have recommended not to continue with the Zetia due to cost, and increasing rosuvastatin as below   Increase Rosuvastatin 10 mg daily  Stop Zetia 10 mg daily     F/U in 6 months     Signed electronically by: Lyndle Herrlich, MD  [facility] Endocrinology  [facility] [address] [place], [place] [address] Phone: [phone] FAX:  [phone]   CC: Pincus Sanes, MD [address] [place] [address] Phone: [phone]  Fax: [phone]  Return to Endocrinology clinic as below: Future Appointments  Date Time Provider Department Center  06/27/2023  1:40 PM Elody Kleinsasser, Konrad Dolores, MD [facility] None  08/15/2023  3:45 PM Alfonse Spruce, MD [facility] None  09/19/2023 12:20 PM [facility]  09/19/2023  1:15 PM Jerene Bears, MD [facility]  10/31/2023  9:10 AM Pincus Sanes, MD [facility] None

## 2023-06-30 ENCOUNTER — Telehealth: Payer: Self-pay | Admitting: Internal Medicine

## 2023-06-30 NOTE — Telephone Encounter (Signed)
Patient dropped off patient assistance paperwork.  This was placed in the providers in box at the front desk.  Company is - [employer]

## 2023-07-11 ENCOUNTER — Ambulatory Visit: Payer: 59 | Admitting: Internal Medicine

## 2023-08-03 ENCOUNTER — Other Ambulatory Visit: Payer: Self-pay

## 2023-08-03 ENCOUNTER — Encounter: Payer: Self-pay | Admitting: Internal Medicine

## 2023-08-03 DIAGNOSIS — E1169 Type 2 diabetes mellitus with other specified complication: Secondary | ICD-10-CM

## 2023-08-03 MED ORDER — EMPAGLIFLOZIN 25 MG PO TABS: 25.0000 mg | ORAL_TABLET | Freq: Every day | ORAL | 1 refills | Status: DC

## 2023-08-10 ENCOUNTER — Encounter: Payer: Self-pay | Admitting: Internal Medicine

## 2023-08-13 ENCOUNTER — Other Ambulatory Visit: Payer: Self-pay | Admitting: Internal Medicine

## 2023-08-13 DIAGNOSIS — E1169 Type 2 diabetes mellitus with other specified complication: Secondary | ICD-10-CM

## 2023-08-15 ENCOUNTER — Ambulatory Visit: Payer: 59 | Admitting: Allergy & Immunology

## 2023-09-14 ENCOUNTER — Ambulatory Visit (HOSPITAL_BASED_OUTPATIENT_CLINIC_OR_DEPARTMENT_OTHER): Payer: 59 | Admitting: Radiology

## 2023-09-19 ENCOUNTER — Ambulatory Visit (HOSPITAL_BASED_OUTPATIENT_CLINIC_OR_DEPARTMENT_OTHER): Payer: 59 | Admitting: Radiology

## 2023-09-19 ENCOUNTER — Ambulatory Visit (HOSPITAL_BASED_OUTPATIENT_CLINIC_OR_DEPARTMENT_OTHER): Payer: 59 | Admitting: Obstetrics & Gynecology

## 2023-10-25 ENCOUNTER — Encounter: Payer: Self-pay | Admitting: Internal Medicine

## 2023-10-25 ENCOUNTER — Ambulatory Visit: Payer: 59 | Admitting: Internal Medicine

## 2023-10-25 VITALS — BP 120/76 | HR 77 | Ht 62.0 in | Wt 242.0 lb

## 2023-10-25 DIAGNOSIS — E1169 Type 2 diabetes mellitus with other specified complication: Secondary | ICD-10-CM

## 2023-10-25 DIAGNOSIS — E785 Hyperlipidemia, unspecified: Secondary | ICD-10-CM | POA: Diagnosis not present

## 2023-10-25 DIAGNOSIS — Z7984 Long term (current) use of oral hypoglycemic drugs: Secondary | ICD-10-CM

## 2023-10-25 LAB — POCT GLYCOSYLATED HEMOGLOBIN (HGB A1C): Hemoglobin A1C: 6.8 % — AB (ref 4.0–5.6)

## 2023-10-25 MED ORDER — METFORMIN HCL ER 750 MG PO TB24: 750.0000 mg | ORAL_TABLET | Freq: Two times a day (BID) | ORAL | 3 refills | Status: DC

## 2023-10-25 MED ORDER — ROSUVASTATIN CALCIUM 10 MG PO TABS: 10.0000 mg | ORAL_TABLET | Freq: Every day | ORAL | 3 refills | Status: DC

## 2023-10-25 MED ORDER — EMPAGLIFLOZIN 25 MG PO TABS: 25.0000 mg | ORAL_TABLET | Freq: Every day | ORAL | 3 refills | Status: DC

## 2023-10-25 MED ORDER — GLIMEPIRIDE 1 MG PO TABS: 1.0000 mg | ORAL_TABLET | Freq: Every day | ORAL | 3 refills | Status: DC

## 2023-10-25 NOTE — Patient Instructions (Signed)
-   Continue Glimepiride 1 mg daily  - Continue Metformin 750 mg twice daily  - Continue Jardiance 25 mg daily    HOW TO TREAT LOW BLOOD SUGARS (Blood sugar LESS THAN 70 MG/DL) Please follow the RULE OF 15 for the treatment of hypoglycemia treatment (when your (blood sugars are less than 70 mg/dL)   STEP 1: Take 15 grams of carbohydrates when your blood sugar is low, which includes:  3-4 GLUCOSE TABS  OR 3-4 OZ OF JUICE OR REGULAR SODA OR ONE TUBE OF GLUCOSE GEL    STEP 2: RECHECK blood sugar in 15 MINUTES STEP 3: If your blood sugar is still low at the 15 minute recheck --> then, go back to STEP 1 and treat AGAIN with another 15 grams of carbohydrates. Stop Jardiance stop Jardiance start Synjardy 12 point 12-998, 2 tabs daily Synjardy 12.12-998 mg,

## 2023-10-25 NOTE — Progress Notes (Signed)
 Name: Jill [name]  Age/ Sex: 63 y.o., female   MRN/ DOB: [id number], 02-03-1961     PCP: Pincus Sanes, MD   Reason for Endocrinology Evaluation: Type 2 Diabetes Mellitus/ Hypercalcemia   Initial Endocrine Consultative Visit: 06/25/2020    PATIENT IDENTIFIER: Jill [name] is a 63 y.o. female with a past medical history of T2DM, HTN, Ashtma and Primary Hyperparathyroidism ( S/P parathyroidectomy 2022). The patient has followed with Endocrinology clinic since 06/25/2020 for consultative assistance with management of her diabetes.  DIABETIC HISTORY:  Jill [name] was diagnosed with DM > 30 yrs ago. Ozempic caused GI side effects. No reported intolerance to Trajenta .She was started on Glipizide and Jardiance in 08/2020 Her hemoglobin A1c has ranged from  6.7% in 2019 to 11.7% in 2021   Glipizide was stopped by PCP , we restarted at smaller dose in 12/2020   We stopped Kombiglyze and switch Jardiance to [place] by April 2023 Started Trulicity April 2023, this was eventually switched to [facility] in 2023  Glipizide stopped 03/2022 with an A1c of 6.7%   Glimepiride started 09/2022 with an A1c of 9.4%  Mounjaro discontinued 2024 due to cost  Was provided with pt assistance forms 06/2023 for Jardiance   Hyperparathyroid History  The patient was first diagnosed with hypercalcemia in 2015, with maximum serum calcium level of  level 11.2 mg/dL ( Non corrected)    Her 24- hr urinary Ca/Cr ratio of 0.012 with a 191 mg of 24-hr calcium (06/2020) DXA (10/06/2020) low bone density    She met surgical criteria  due to low GFR  She is S/P left inferior parathyroidectomy 05/24/2021  SUBJECTIVE:   During the last visit (06/27/2023): A1c 6.3 %     Today (10/25/2023): Jill [name]  She checks her blood sugars multiple  times daily, through the CGM. The patient has not had hypoglycemic episodes since the last clinic visit.   Denies nausea or vomiting  Denies constipation  Has chronic  pruritus , sees Dr. Dellis Anes   HOME DIABETES REGIMEN:  -Glimepiride 1 mg daily -Metformin 750 mg BID  -Jardiance 25 daily -Rosuvastatin 10 mg daily     Statin: yes ACE-I/ARB: yes    CONTINUOUS GLUCOSE MONITORING RECORD INTERPRETATION    Dates of Recording:3/6-3/19/2025  Sensor description:dexcom  Results statistics:   CGM use % of time 87  Average and SD 189/63  Time in range 51  % Time Above 180 32  % Time above 250 17  % Time Below target 0   Glycemic patterns summary: BGs are optimal overnight and in between the meals  Hyperglycemic episodes  postprandial   Hypoglycemic episodes occurred N/A  Overnight periods: optimal     DIABETIC COMPLICATIONS: Microvascular complications:  CKD III Denies: Neuropathy, retinopathy Last Eye Exam: Completed 03/2021  Macrovascular complications:   Denies: CAD, CVA, PVD   HISTORY:  Past Medical History:  Past Medical History:  Diagnosis Date   Allergy    Anemia    Arthritis    Asthma    17yrs ago   Chronic deafness of both ears    left ear has 40% hearing-right ear has 60 % hearing   Colon polyps    Complication of anesthesia    Hard to wake up   Constipation    Diabetes mellitus without complication (HCC)    Heartburn    High cholesterol    Migraines    none in last 11 yrs   Obesity    Pelvic  kidney    left   Pneumonia    Post partum depression    Urinary incontinence    Past Surgical History:  Past Surgical History:  Procedure Laterality Date   CESAREAN SECTION     COLONOSCOPY WITH PROPOFOL N/A 03/24/2015   Procedure: COLONOSCOPY WITH PROPOFOL;  Surgeon: Charna Elizabeth, MD;  [facility];  Service: Endoscopy;  Laterality: N/A;   HEMORRHOID BANDING  08/2017, 09/2017   KNEE ARTHROSCOPY WITH MEDIAL MENISECTOMY Left 04/17/2014   Procedure: LEFT KNEE ARTHROSCOPY WITH PARTIAL MEDIAL MENISCECTOMY AND CHONDROPLASTY;  Surgeon: Javier Docker, MD;  [facility]: [facility];  Service: Orthopedics;  Laterality:  Left;   PARATHYROIDECTOMY Left 05/24/2021   Procedure: LEFT INFERIOR PARATHYROIDECTOMY, POSSIBLE NECK EXPLORATION;  Surgeon: Darnell Level, MD;  Location: [facility];  Service: General;  Laterality: Left;   TYMPANOSTOMY TUBE PLACEMENT Right    Social History:  reports that she has never smoked. She has never used smokeless tobacco. She reports that she does not currently use alcohol. She reports that she does not use drugs. Family History:  Family History  Problem Relation Age of Onset   Hypertension Mother    Arthritis Mother    Hypercalcemia Mother    Obesity Mother    Allergic rhinitis Mother    Asthma Mother    Hypertension Sister    Deep vein thrombosis Sister    Pulmonary embolism Sister    Asthma Daughter    Diabetes Maternal Grandmother    Heart failure Maternal Grandmother    Heart disease Maternal Grandmother    Hyperlipidemia Maternal Grandmother    Hypertension Maternal Grandmother    Hypertension Maternal Grandfather    Hyperlipidemia Maternal Grandfather    Breast cancer Paternal Aunt        diagnosed in her 53's   Breast cancer Paternal Grandmother      HOME MEDICATIONS: Allergies as of 10/25/2023       Reactions   Linzess [linaclotide] Other (See Comments)   Abdominal pain    Pravastatin Other (See Comments)   Body aches   Adhesive [tape]    Pulls skin off   Atorvastatin    Muscle aches, stomach upset   Latex Itching   Toradol [ketorolac Tromethamine]    Joint pain and swelling   Tramadol Other (See Comments)   JOINT PAIN        Medication List        Accurate as of October 25, 2023  7:59 AM. If you have any questions, ask your nurse or doctor.          Blood Glucose Monitoring Suppl Devi 1 each by Does not apply route in the morning, at noon, and at bedtime. May substitute to any manufacturer covered by patient's insurance. E11.9   CERAVE DAILY MOISTURIZING EX Apply 1 application topically in the morning and at bedtime. Mixed with  triamcinolone cream   cetirizine 10 MG tablet Commonly known as: ZYRTEC TAKE 2 TABLETS (20 MG TOTAL) BY MOUTH DAILY AS NEEDED.   chlorhexidine 0.12 % solution Commonly known as: PERIDEX Use as directed 15 mLs in the mouth or throat in the morning and at bedtime.   Dexcom G7 Sensor Misc UAD to monitor sugars  E11.9   empagliflozin 25 MG Tabs tablet Commonly known as: Jardiance Take 1 tablet (25 mg total) by mouth daily before breakfast.   famotidine 20 MG tablet Commonly known as: PEPCID TAKE 1 TABLET BY MOUTH TWICE  DAILY   fluticasone 50 MCG/ACT nasal  spray Commonly known as: FLONASE Place 2 sprays into both nostrils daily.   glimepiride 1 MG tablet Commonly known as: AMARYL TAKE 1 TABLET BY MOUTH DAILY  WITH BREAKFAST   Glucose Control Soln UAD.   Training and development officer by insurance.  E11.9   levocetirizine 5 MG tablet Commonly known as: XYZAL Take 1 tablet (5 mg total) by mouth every evening.   losartan 25 MG tablet Commonly known as: COZAAR TAKE 1 TABLET BY MOUTH DAILY   metFORMIN 750 MG 24 hr tablet Commonly known as: GLUCOPHAGE-XR TAKE 1 TABLET BY MOUTH IN THE  MORNING AND AT BEDTIME   montelukast 10 MG tablet Commonly known as: SINGULAIR TAKE 1 TABLET BY MOUTH AT  BEDTIME   olopatadine 0.1 % ophthalmic solution Commonly known as: PATANOL Place 1 drop into both eyes 2 (two) times daily.   pimecrolimus 1 % cream Commonly known as: Elidel Apply topically 2 (two) times daily.   rosuvastatin 10 MG tablet Commonly known as: Crestor Take 1 tablet (10 mg total) by mouth daily.   triamcinolone ointment 0.1 % Commonly known as: KENALOG Apply 1 Application topically 2 (two) times daily.         OBJECTIVE:   Vital Signs: BP 120/76 (BP Location: Left Arm, Patient Position: Sitting, Cuff Size: Normal)   Pulse 77   Ht 5\' 2"  (1.575 m)   Wt 242 lb (109.8 kg)   SpO2 98%   BMI 44.26 kg/m   Wt Readings from Last 3 Encounters:  10/25/23 242 lb (109.8 kg)   06/27/23 233 lb (105.7 kg)  05/03/23 224 lb (101.6 kg)     Exam: General: Pt appears well and is in NAD  Lungs: Clear with good BS bilat   Heart: RRR   Extremities: Trace pretibial edema.   Neuro: MS is good with appropriate affect, pt is alert and Ox3     DM Foot Exam 06/27/2023  The skin of the feet is intact without sores or ulcerations. The pedal pulses are 2+ on right and 2+ on left. The sensation is intact to a screening 5.07, 10 gram monofilament bilaterally     DATA REVIEWED:  Lab Results  Component Value Date   HGBA1C 6.8 (A) 10/25/2023   HGBA1C 6.3 05/03/2023   HGBA1C 6.1 12/23/2022    Latest Reference Range & Units 05/03/23 08:49  Sodium 135 - 145 mEq/L 140  Potassium 3.5 - 5.1 mEq/L 4.4  Chloride 96 - 112 mEq/L 104  CO2 19 - 32 mEq/L 27  Glucose 70 - 99 mg/dL 086 (H)  BUN 6 - 23 mg/dL 13  Creatinine 5.78 - 4.69 mg/dL 6.29  Calcium 8.4 - 52.8 mg/dL 9.4  Alkaline Phosphatase 39 - 117 U/L 80  Albumin 3.5 - 5.2 g/dL 4.3  AST 0 - 37 U/L 12  ALT 0 - 35 U/L 14  Total Protein 6.0 - 8.3 g/dL 7.7  Total Bilirubin 0.2 - 1.2 mg/dL 0.5  GFR >41.32 mL/min 56.23 (L)    Latest Reference Range & Units 05/03/23 08:49  Total CHOL/HDL Ratio  2  Cholesterol 0 - 200 mg/dL 440  HDL Cholesterol >10.27 mg/dL 25.36  LDL (calc) 0 - 99 mg/dL 51  NonHDL  64.40  Triglycerides 0.0 - 149.0 mg/dL 34.7  VLDL 0.0 - 42.5 mg/dL 95.6     ASSESSMENT / PLAN / RECOMMENDATIONS:   1) Type 2 Diabetes Mellitus, Optimally  controlled, Without complications - Most recent A1c of 6.8 %. Goal A1c < 7.0 %.    -  A1c remains at goal  -She was provided with patient assistance forms for Jardiance but this was not filled out and is currently able to to obtain it through pharmacy  - Greggory Keen was cost prohibitive 2024  MEDICATIONS: - Contine Glimepiride 1 mg, 1 tablet before breakfast - Continue metformin 750 mg twice daily - Continue Jardiance 25 mg daily    EDUCATION /  INSTRUCTIONS: BG monitoring instructions: Patient is instructed to check her blood sugars 3  times a day, before meals . Call [facility] if: BG persistently < 70 I reviewed the Rule of 15 for the treatment of hypoglycemia in detail with the patient. Literature supplied.  2) Diabetic complications:  Eye: Does not have known diabetic retinopathy.  Neuro/ Feet: Does not have known diabetic peripheral neuropathy .  Renal: Patient does  have known baseline CKD. She   is not on an ACEI/ARB at present.      3) Dyslipidemia:   -She has reported prior  intolerance to daily rosuvastatin and atorvastatin in the past   - We stopped Pravastatin and started weekly Rosuvastatin 12/2020 -Following parathyroidectomy, the patient has noted resolution of joint aches and pains and increased rosuvastatin to daily without any side effects 2024  - LDL at goal   Medication   Continue Rosuvastatin 10 mg daily     F/U in 6 months     Signed electronically by: Lyndle Herrlich, MD  [facility] Endocrinology  [facility] [address] [place] [place], [place] [address] Phone: [phone] FAX: [phone]   CC: Pincus Sanes, MD [address] [place] [address] Phone: [phone]  Fax: [phone]  Return to Endocrinology clinic as below: Future Appointments  Date Time Provider Department Center  10/31/2023  9:10 AM Pincus Sanes, MD [facility] None  12/19/2023 10:00 AM [facility]  12/19/2023 10:35 AM Jerene Bears, MD [facility]

## 2023-10-30 NOTE — Patient Instructions (Addendum)
      Blood work was ordered.       Medications changes include :   mounjaro 2.5 mg weekly    Start use Vanicream twice a day     Return in about 6 months (around 05/02/2024) for Physical Exam.

## 2023-10-30 NOTE — Progress Notes (Unsigned)
 Subjective:    Patient ID: Jill [name], female    DOB: Dec 24, 1960, 63 y.o.   MRN: [id number]     HPI Jill [name] is here for follow up of her chronic medical problems.  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  She will be placed in assisted living at some point soon, but until that is done she has been able to stay with her at night.  Itching skin for years.  Not allergic in nature - sees an allergist.  Using triamcinolone ointment on skin.  It helps.  Takes an anti-histamine.   She has regained the weight that she lost with Mounjaro.  She stopped it because of cost and also did not like injecting herself.  She wonders if she needs to go back on it.   Medications and allergies reviewed with patient and updated if appropriate.  Current Outpatient Medications on File Prior to Visit  Medication Sig Dispense Refill   Blood Glucose Calibration (GLUCOSE CONTROL) SOLN UAD.   Training and development officer by insurance.  E11.9 1 each 0   Blood Glucose Monitoring Suppl DEVI 1 each by Does not apply route in the morning, at noon, and at bedtime. May substitute to any manufacturer covered by patient's insurance. E11.9 1 each 0   cetirizine (ZYRTEC) 10 MG tablet TAKE 2 TABLETS (20 MG TOTAL) BY MOUTH DAILY AS NEEDED. 180 tablet 0   chlorhexidine (PERIDEX) 0.12 % solution Use as directed 15 mLs in the mouth or throat in the morning and at bedtime.     Continuous Glucose Sensor (DEXCOM G7 SENSOR) MISC UAD to monitor sugars  E11.9 9 each 3   empagliflozin (JARDIANCE) 25 MG TABS tablet Take 1 tablet (25 mg total) by mouth daily before breakfast. 90 tablet 3   glimepiride (AMARYL) 1 MG tablet Take 1 tablet (1 mg total) by mouth daily with breakfast. 90 tablet 3   losartan (COZAAR) 25 MG tablet TAKE 1 TABLET BY MOUTH DAILY 90 tablet 3   metFORMIN (GLUCOPHAGE-XR) 750 MG 24 hr tablet Take 1 tablet (750 mg total) by mouth 2 (two) times daily. 180 tablet 3   olopatadine (PATANOL) 0.1 % ophthalmic solution Place 1  drop into both eyes 2 (two) times daily. 5 mL 5   rosuvastatin (CRESTOR) 10 MG tablet Take 1 tablet (10 mg total) by mouth daily. 90 tablet 3   triamcinolone ointment (KENALOG) 0.1 % Apply 1 Application topically 2 (two) times daily. 454 g 3   Emollient (CERAVE DAILY MOISTURIZING EX) Apply 1 application topically in the morning and at bedtime. Mixed with triamcinolone cream (Patient not taking: Reported on 10/31/2023)     famotidine (PEPCID) 20 MG tablet TAKE 1 TABLET BY MOUTH TWICE  DAILY (Patient not taking: Reported on 10/31/2023) 120 tablet 5   fluticasone (FLONASE) 50 MCG/ACT nasal spray Place 2 sprays into both nostrils daily. (Patient not taking: Reported on 10/31/2023)     levocetirizine (XYZAL) 5 MG tablet Take 1 tablet (5 mg total) by mouth every evening. (Patient not taking: Reported on 10/31/2023) 90 tablet 1   montelukast (SINGULAIR) 10 MG tablet TAKE 1 TABLET BY MOUTH AT  BEDTIME (Patient not taking: Reported on 10/31/2023) 90 tablet 3   pimecrolimus (ELIDEL) 1 % cream Apply topically 2 (two) times daily. (Patient not taking: Reported on 10/31/2023) 30 g 5   No current facility-administered medications on file prior to visit.     Review of Systems  Constitutional:  Negative for fever.  Respiratory:  Negative for cough, shortness of breath and wheezing.   Cardiovascular:  Negative for chest pain, palpitations and leg swelling.  Skin:        Diffuse dry and itchy skin  Neurological:  Negative for light-headedness and headaches.       Objective:   Vitals:   10/31/23 0853  BP: 128/74  Pulse: 79  Temp: 98.6 F (37 C)  SpO2: 99%   BP Readings from Last 3 Encounters:  10/31/23 128/74  10/25/23 120/76  06/27/23 136/80   Wt Readings from Last 3 Encounters:  10/31/23 239 lb (108.4 kg)  10/25/23 242 lb (109.8 kg)  06/27/23 233 lb (105.7 kg)   Body mass index is 43.71 kg/m.    Physical Exam Constitutional:      General: She is not in acute distress.    Appearance:  Normal appearance.  HENT:     Head: Normocephalic and atraumatic.  Eyes:     Conjunctiva/sclera: Conjunctivae normal.  Cardiovascular:     Rate and Rhythm: Normal rate and regular rhythm.     Heart sounds: Normal heart sounds.  Pulmonary:     Effort: Pulmonary effort is normal. No respiratory distress.     Breath sounds: Normal breath sounds. No wheezing.  Musculoskeletal:     Cervical back: Neck supple.     Right lower leg: No edema.     Left lower leg: No edema.  Lymphadenopathy:     Cervical: No cervical adenopathy.  Skin:    General: Skin is warm and dry.     Findings: No rash.  Neurological:     Mental Status: She is alert. Mental status is at baseline.  Psychiatric:        Mood and Affect: Mood normal.        Behavior: Behavior normal.        Lab Results  Component Value Date   WBC 5.0 12/23/2022   HGB 13.7 12/23/2022   HCT 41.0 12/23/2022   PLT 232.0 12/23/2022   GLUCOSE 123 (H) 05/03/2023   CHOL 107 05/03/2023   TRIG 56.0 05/03/2023   HDL 44.70 05/03/2023   LDLDIRECT 125 (H) 09/10/2014   LDLCALC 51 05/03/2023   ALT 14 05/03/2023   AST 12 05/03/2023   NA 140 05/03/2023   K 4.4 05/03/2023   CL 104 05/03/2023   CREATININE 1.07 05/03/2023   BUN 13 05/03/2023   CO2 27 05/03/2023   TSH 1.29 12/15/2020   HGBA1C 6.8 (A) 10/25/2023   MICROALBUR 1.7 12/23/2022     Assessment & Plan:    See Problem List for Assessment and Plan of chronic medical problems.

## 2023-10-31 ENCOUNTER — Ambulatory Visit: Payer: 59 | Admitting: Internal Medicine

## 2023-10-31 ENCOUNTER — Encounter: Payer: Self-pay | Admitting: Internal Medicine

## 2023-10-31 VITALS — BP 128/74 | HR 79 | Temp 98.6°F | Ht 62.0 in | Wt 239.0 lb

## 2023-10-31 DIAGNOSIS — E1165 Type 2 diabetes mellitus with hyperglycemia: Secondary | ICD-10-CM

## 2023-10-31 DIAGNOSIS — I10 Essential (primary) hypertension: Secondary | ICD-10-CM

## 2023-10-31 DIAGNOSIS — E782 Mixed hyperlipidemia: Secondary | ICD-10-CM | POA: Diagnosis not present

## 2023-10-31 DIAGNOSIS — R350 Frequency of micturition: Secondary | ICD-10-CM | POA: Insufficient documentation

## 2023-10-31 DIAGNOSIS — L853 Xerosis cutis: Secondary | ICD-10-CM | POA: Insufficient documentation

## 2023-10-31 DIAGNOSIS — Z7984 Long term (current) use of oral hypoglycemic drugs: Secondary | ICD-10-CM

## 2023-10-31 MED ORDER — TIRZEPATIDE 2.5 MG/0.5ML ~~LOC~~ SOAJ: 2.5000 mg | SUBCUTANEOUS | 0 refills | Status: DC

## 2023-10-31 MED ORDER — CETIRIZINE HCL 10 MG PO TABS: 10.0000 mg | ORAL_TABLET | Freq: Two times a day (BID) | ORAL | 3 refills | Status: DC

## 2023-10-31 NOTE — Assessment & Plan Note (Addendum)
 Chronic Check lipid panel Continue crestor 5 mg daily No longer on Zetia secondary to price of medication Regular exercise and healthy diet encouraged

## 2023-10-31 NOTE — Assessment & Plan Note (Addendum)
 Chronic  Lab Results  Component Value Date   HGBA1C 6.8 (A) 10/25/2023   Sugars controlled Check urine microalbumin Following with endocrine Continue Jardiance 25 mg daily, glimepiride 1 mg daily, metformin 750 mg twice daily She has regained weight since going off Mounjaro and wonders about going back on to help keep her sugars controlled-start Mounjaro 2.5 mg weekly-endocrine can take over this prescription since she is managing her diabetes Advised monitoring sugars closely and contacting endocrine with any concerns On losartan 25 mg daily, Jardiance 25 mg daily for renal protection Discussed exercise, diabetic diet

## 2023-10-31 NOTE — Assessment & Plan Note (Signed)
 Chronic States chronic itching of her skin Currently using triamcinolone ointment on her skin She states CeraVe lotion has not helped in the past Advised using triamcinolone ointment as needed only Start Vanicream to body twice daily

## 2023-10-31 NOTE — Assessment & Plan Note (Addendum)
 Having increased frequency and urgency recently ?  UTI Check UA, urine culture

## 2023-10-31 NOTE — Assessment & Plan Note (Signed)
Chronic BP well controlled Continue losartan 25 mg daily Cmp, cbc

## 2023-11-01 ENCOUNTER — Other Ambulatory Visit (INDEPENDENT_AMBULATORY_CARE_PROVIDER_SITE_OTHER)

## 2023-11-01 DIAGNOSIS — E782 Mixed hyperlipidemia: Secondary | ICD-10-CM

## 2023-11-01 DIAGNOSIS — I10 Essential (primary) hypertension: Secondary | ICD-10-CM | POA: Diagnosis not present

## 2023-11-01 DIAGNOSIS — E1165 Type 2 diabetes mellitus with hyperglycemia: Secondary | ICD-10-CM

## 2023-11-01 DIAGNOSIS — R350 Frequency of micturition: Secondary | ICD-10-CM

## 2023-11-01 LAB — COMPREHENSIVE METABOLIC PANEL
ALT: 16 U/L (ref 0–35)
AST: 15 U/L (ref 0–37)
Albumin: 4.5 g/dL (ref 3.5–5.2)
Alkaline Phosphatase: 73 U/L (ref 39–117)
BUN: 21 mg/dL (ref 6–23)
CO2: 25 meq/L (ref 19–32)
Calcium: 9.4 mg/dL (ref 8.4–10.5)
Chloride: 104 meq/L (ref 96–112)
Creatinine, Ser: 1.05 mg/dL (ref 0.40–1.20)
GFR: 57.32 mL/min — ABNORMAL LOW (ref >60.00)
Glucose, Bld: 146 mg/dL — ABNORMAL HIGH (ref 70–99)
Potassium: 4.3 meq/L (ref 3.5–5.1)
Sodium: 138 meq/L (ref 135–145)
Total Bilirubin: 0.4 mg/dL (ref 0.2–1.2)
Total Protein: 7.8 g/dL (ref 6.0–8.3)

## 2023-11-01 LAB — MICROALBUMIN / CREATININE URINE RATIO
Creatinine,U: 109.5 mg/dL
Microalb Creat Ratio: 7.4 mg/g (ref 0.0–30.0)
Microalb, Ur: 0.8 mg/dL (ref 0.0–1.9)

## 2023-11-01 LAB — URINALYSIS, ROUTINE W REFLEX MICROSCOPIC
Bilirubin Urine: NEGATIVE
Hgb urine dipstick: NEGATIVE
Ketones, ur: NEGATIVE
Leukocytes,Ua: NEGATIVE
Nitrite: NEGATIVE
RBC / HPF: NONE SEEN (ref 0–?)
Specific Gravity, Urine: 1.015 (ref 1.000–1.030)
Total Protein, Urine: NEGATIVE
Urine Glucose: >=1000 — AB
Urobilinogen, UA: 0.2 (ref 0.0–1.0)
pH: 6 (ref 5.0–8.0)

## 2023-11-01 LAB — CBC WITH DIFFERENTIAL/PLATELET
Basophils Absolute: 0 10*3/uL (ref 0.0–0.1)
Basophils Relative: 0.7 % (ref 0.0–3.0)
Eosinophils Absolute: 0.1 10*3/uL (ref 0.0–0.7)
Eosinophils Relative: 1.5 % (ref 0.0–5.0)
HCT: 42.8 % (ref 36.0–46.0)
Hemoglobin: 14.1 g/dL (ref 12.0–15.0)
Lymphocytes Relative: 33.7 % (ref 12.0–46.0)
Lymphs Abs: 1.5 10*3/uL (ref 0.7–4.0)
MCHC: 32.9 g/dL (ref 30.0–36.0)
MCV: 84.6 fl (ref 78.0–100.0)
Monocytes Absolute: 0.3 10*3/uL (ref 0.1–1.0)
Monocytes Relative: 6.5 % (ref 3.0–12.0)
Neutro Abs: 2.6 10*3/uL (ref 1.4–7.7)
Neutrophils Relative %: 57.6 % (ref 43.0–77.0)
Platelets: 206 10*3/uL (ref 150.0–400.0)
RBC: 5.06 Mil/uL (ref 3.87–5.11)
RDW: 14.5 % (ref 11.5–15.5)
WBC: 4.5 10*3/uL (ref 4.0–10.5)

## 2023-11-01 LAB — LIPID PANEL
Cholesterol: 116 mg/dL (ref 0–200)
HDL: 42.6 mg/dL (ref >39.00)
LDL Cholesterol: 62 mg/dL (ref 0–99)
NonHDL: 73.15
Total CHOL/HDL Ratio: 3
Triglycerides: 55 mg/dL (ref 0.0–149.0)
VLDL: 11 mg/dL (ref 0.0–40.0)

## 2023-11-01 LAB — HEPATIC FUNCTION PANEL
ALT: 16 U/L (ref 0–35)
AST: 15 U/L (ref 0–37)
Albumin: 4.5 g/dL (ref 3.5–5.2)
Alkaline Phosphatase: 73 U/L (ref 39–117)
Bilirubin, Direct: 0.1 mg/dL (ref 0.0–0.3)
Total Bilirubin: 0.4 mg/dL (ref 0.2–1.2)
Total Protein: 7.8 g/dL (ref 6.0–8.3)

## 2023-11-02 LAB — URINE CULTURE: Result:: NO GROWTH

## 2023-11-03 ENCOUNTER — Encounter: Payer: Self-pay | Admitting: Internal Medicine

## 2023-11-13 ENCOUNTER — Encounter: Payer: Self-pay | Admitting: Internal Medicine

## 2023-11-15 ENCOUNTER — Other Ambulatory Visit: Payer: Self-pay

## 2023-11-15 MED ORDER — CETIRIZINE HCL 10 MG PO TABS
10.0000 mg | ORAL_TABLET | Freq: Two times a day (BID) | ORAL | 3 refills | Status: AC
Start: 2023-11-15 — End: ?

## 2023-11-15 MED ORDER — TIRZEPATIDE 2.5 MG/0.5ML ~~LOC~~ SOAJ: 2.5000 mg | SUBCUTANEOUS | 0 refills | Status: DC

## 2023-11-16 ENCOUNTER — Other Ambulatory Visit: Payer: Self-pay

## 2023-11-16 MED ORDER — TIRZEPATIDE 2.5 MG/0.5ML ~~LOC~~ SOAJ: 2.5000 mg | SUBCUTANEOUS | 0 refills | Status: DC

## 2023-11-27 LAB — HM DIABETES EYE EXAM

## 2023-12-06 ENCOUNTER — Ambulatory Visit (HOSPITAL_BASED_OUTPATIENT_CLINIC_OR_DEPARTMENT_OTHER)

## 2023-12-06 ENCOUNTER — Ambulatory Visit (HOSPITAL_BASED_OUTPATIENT_CLINIC_OR_DEPARTMENT_OTHER): Admitting: Orthopaedic Surgery

## 2023-12-06 DIAGNOSIS — M25851 Other specified joint disorders, right hip: Secondary | ICD-10-CM | POA: Diagnosis not present

## 2023-12-06 DIAGNOSIS — M25551 Pain in right hip: Secondary | ICD-10-CM | POA: Diagnosis not present

## 2023-12-06 MED ORDER — TRIAMCINOLONE ACETONIDE 40 MG/ML IJ SUSP
80.0000 mg | INTRAMUSCULAR | Status: AC | PRN
  Administered 2023-12-06: 80 mg via INTRA_ARTICULAR

## 2023-12-06 MED ORDER — LIDOCAINE HCL 1 % IJ SOLN
4.0000 mL | INTRAMUSCULAR | Status: AC | PRN
  Administered 2023-12-06: 4 mL

## 2023-12-06 NOTE — Progress Notes (Signed)
 Chief Complaint: Right thigh and hip pain     History of Present Illness:   12/06/2023: Experiencing pain in the right hip today.  She is experiencing this more of an a C-shaped distribution deep in the groin.  Jill [name] is a 63 y.o. female presents today with ongoing right hip and thigh pain as a referral from Dr. Alease Hunter.  She is here today for further assessment to see if she would be a possible candidate for hip arthroscopy.  Overall she states that she has had pain in the right hip area since May 2023.  She not have any known injury.  She does feel like the pain radiates.  She has not had any physical therapy.  She has had 2 injections in June and July into the femoral acetabular joint with the last injection providing only a day of relief.  She is here today for further assessment.  She does have a history of diabetes with elevated A1c.    Surgical History:   None  PMH/PSH/Family History/Social History/Meds/Allergies:    Past Medical History:  Diagnosis Date  . Allergy   . Anemia   . Arthritis   . Asthma    37yrs ago  . Chronic deafness of both ears    left ear has 40% hearing-right ear has 60 % hearing  . Colon polyps   . Complication of anesthesia    Hard to wake up  . Constipation   . Diabetes mellitus without complication (HCC)   . Heartburn   . High cholesterol   . Migraines    none in last 11 yrs  . Obesity   . Pelvic kidney    left  . Pneumonia   . Post partum depression   . Urinary incontinence    Past Surgical History:  Procedure Laterality Date  . CESAREAN SECTION    . COLONOSCOPY WITH PROPOFOL  N/A 03/24/2015   Procedure: COLONOSCOPY WITH PROPOFOL ;  Surgeon: Tami Falcon, MD;  Location: [facility];  Service: Endoscopy;  Laterality: N/A;  . HEMORRHOID BANDING  08/2017, 09/2017  . KNEE ARTHROSCOPY WITH MEDIAL MENISECTOMY Left 04/17/2014   Procedure: LEFT KNEE ARTHROSCOPY WITH PARTIAL MEDIAL MENISCECTOMY AND  CHONDROPLASTY;  Surgeon: Loel Ring, MD;  Location: [facility];  Service: Orthopedics;  Laterality: Left;  . PARATHYROIDECTOMY Left 05/24/2021   Procedure: LEFT INFERIOR PARATHYROIDECTOMY, POSSIBLE NECK EXPLORATION;  Surgeon: Oralee Billow, MD;  Location: [facility];  Service: General;  Laterality: Left;  . TYMPANOSTOMY TUBE PLACEMENT Right    Social History   Socioeconomic History  . Marital status: Single    Spouse name: Not on file  . Number of children: 1  . Years of education: college   . Highest education level: Some college, no degree  Occupational History  . Occupation: social services case worker    Employer: [employer]  Tobacco Use  . Smoking status: Never  . Smokeless tobacco: Never  Vaping Use  . Vaping status: Never Used  Substance and Sexual Activity  . Alcohol use: Not Currently    Comment: rare  . Drug use: No  . Sexual activity: Not Currently    Partners: Male    Birth control/protection: Abstinence, I.U.D.    Comment: Mirena  placed 05/2016  Other Topics Concern  . Not on file  Social History Narrative  Single. Education: [employer].    Social Drivers of Health   Financial Resource Strain: Medium Risk (05/02/2023)   Overall Financial Resource Strain (CARDIA)   . Difficulty of Paying Living Expenses: Somewhat hard  Food Insecurity: Food Insecurity Present (05/02/2023)   Hunger Vital Sign   . Worried About Programme researcher, broadcasting/film/video in the Last Year: Sometimes true   . Ran Out of Food in the Last Year: Sometimes true  Transportation Needs: No Transportation Needs (05/02/2023)   PRAPARE - Transportation   . Lack of Transportation (Medical): No   . Lack of Transportation (Non-Medical): No  Physical Activity: Unknown (11/09/2018)   Exercise Vital Sign   . Days of Exercise per Week: Patient declined   . Minutes of Exercise per Session: Patient declined  Stress: No Stress Concern Present (05/02/2023)   [employer] of Occupational Health - Occupational Stress  Questionnaire   . Feeling of Stress : Not at all  Social Connections: Socially Isolated (05/02/2023)   Social Connection and Isolation Panel [NHANES]   . Frequency of Communication with Friends and Family: Never   . Frequency of Social Gatherings with Friends and Family: Never   . Attends Religious Services: Never   . Active Member of Clubs or Organizations: No   . Attends Banker Meetings: Not on file   . Marital Status: Never married   Family History  Problem Relation Age of Onset  . Hypertension Mother   . Arthritis Mother   . Hypercalcemia Mother   . Obesity Mother   . Allergic rhinitis Mother   . Asthma Mother   . Hypertension Sister   . Deep vein thrombosis Sister   . Pulmonary embolism Sister   . Asthma Daughter   . Diabetes Maternal Grandmother   . Heart failure Maternal Grandmother   . Heart disease Maternal Grandmother   . Hyperlipidemia Maternal Grandmother   . Hypertension Maternal Grandmother   . Hypertension Maternal Grandfather   . Hyperlipidemia Maternal Grandfather   . Breast cancer Paternal Aunt        diagnosed in her 45's  . Breast cancer Paternal Grandmother    Allergies  Allergen Reactions  . Linzess  [Linaclotide ] Other (See Comments)    Abdominal pain   . Pravastatin  Other (See Comments)    Body aches  . Adhesive [Tape]     Pulls skin off  . Atorvastatin      Muscle aches, stomach upset  . Latex Itching  . Toradol  [Ketorolac  Tromethamine ]     Joint pain and swelling  . Tramadol  Other (See Comments)    JOINT PAIN   Current Outpatient Medications  Medication Sig Dispense Refill  . Blood Glucose Calibration (GLUCOSE CONTROL) SOLN UAD.   Training and development officer by insurance.  E11.9 1 each 0  . Blood Glucose Monitoring Suppl DEVI 1 each by Does not apply route in the morning, at noon, and at bedtime. May substitute to any manufacturer covered by patient's insurance. E11.9 1 each 0  . cetirizine  (ZYRTEC ) 10 MG tablet Take 1 tablet (10 mg  total) by mouth 2 (two) times daily. 180 tablet 3  . chlorhexidine  (PERIDEX ) 0.12 % solution Use as directed 15 mLs in the mouth or throat in the morning and at bedtime.    . Continuous Glucose Sensor (DEXCOM G7 SENSOR) MISC UAD to monitor sugars  E11.9 9 each 3  . Emollient (CERAVE DAILY MOISTURIZING EX) Apply 1 application topically in the morning and at bedtime. Mixed with triamcinolone  cream (Patient not  taking: Reported on 10/31/2023)    . empagliflozin  (JARDIANCE ) 25 MG TABS tablet Take 1 tablet (25 mg total) by mouth daily before breakfast. 90 tablet 3  . famotidine  (PEPCID ) 20 MG tablet TAKE 1 TABLET BY MOUTH TWICE  DAILY (Patient not taking: Reported on 10/31/2023) 120 tablet 5  . fluticasone  (FLONASE ) 50 MCG/ACT nasal spray Place 2 sprays into both nostrils daily. (Patient not taking: Reported on 10/31/2023)    . glimepiride  (AMARYL ) 1 MG tablet Take 1 tablet (1 mg total) by mouth daily with breakfast. 90 tablet 3  . losartan  (COZAAR ) 25 MG tablet TAKE 1 TABLET BY MOUTH DAILY 90 tablet 3  . metFORMIN  (GLUCOPHAGE -XR) 750 MG 24 hr tablet Take 1 tablet (750 mg total) by mouth 2 (two) times daily. 180 tablet 3  . montelukast  (SINGULAIR ) 10 MG tablet TAKE 1 TABLET BY MOUTH AT  BEDTIME (Patient not taking: Reported on 10/31/2023) 90 tablet 3  . olopatadine  (PATANOL) 0.1 % ophthalmic solution Place 1 drop into both eyes 2 (two) times daily. 5 mL 5  . rosuvastatin  (CRESTOR ) 10 MG tablet Take 1 tablet (10 mg total) by mouth daily. 90 tablet 3  . tirzepatide  (MOUNJARO ) 2.5 MG/0.5ML Pen Inject 2.5 mg into the skin once a week. 6 mL 0  . triamcinolone  ointment (KENALOG ) 0.1 % Apply 1 Application topically 2 (two) times daily. 454 g 3   No current facility-administered medications for this visit.   No results found.  Review of Systems:   A ROS was performed including pertinent positives and negatives as documented in the HPI.  Physical Exam :   Constitutional: NAD and appears stated  age Neurological: Alert and oriented Psych: Appropriate affect and cooperative There were no vitals taken for this visit.   Comprehensive Musculoskeletal Exam:    Inspection Right Left  Skin No atrophy or gross abnormalities appreciated No atrophy or gross abnormalities appreciated  Palpation    Tenderness Greater trochanter None  Crepitus None None  Range of Motion    Flexion (passive) 100 with pain in groin 120  Extension 30 30  IR 20 with pain 20  ER 30 30  Strength    Flexion  5/5 5/5  Extension 5/5 5/5  Special Tests    FABER Negative Negative  FADIR Negative Negative  ER Lag/Capsular Insufficiency Negative Negative  Instability Negative Negative  Sacroiliac pain Negative  Negative   Instability    Generalized Laxity No No  Neurologic    sciatic, femoral, obturator nerves intact to light sensation  Vascular/Lymphatic    DP pulse 2+ 2+  Lumbar Exam    Patient has symmetric lumbar range of motion with negative pain referral to hip   Positive Trendelenburg on right  Imaging:   Xray (3 views right hip): 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   Assessment:   63 y.o. female with right thigh and hip pain.  I did confirm that she does not fact have evidence of femoral acetabular impingement given this I have recommended ultrasound-guided injection of the right hip.  Plan :    - Plan for right hip femoral acetabular injection    Procedure Note  Patient: Jill [name]  Date of Birth: 1961/01/26           MRN: [id number]             Visit Date: 12/06/2023  Procedures: Visit Diagnoses:  1. Pain in right hip     Large Joint Inj: R hip joint on 12/06/2023 12:26  PM Indications: pain Details: 22 G 3.5 in needle, ultrasound-guided anterolateral approach  Arthrogram: No  Medications: 4 mL lidocaine  1 %; 80 mg triamcinolone  acetonide 40 MG/ML Outcome: tolerated well, no immediate complications Procedure, treatment alternatives, risks and benefits explained, specific risks discussed. Consent was given by the patient. Immediately prior to procedure a time out was called to verify the correct patient, procedure, equipment, support staff and site/side marked as required. Patient was prepped and draped in the usual sterile fashion.         I personally saw and evaluated the patient, and participated in the management and treatment plan.  Wilhelmenia Harada, MD Attending Physician, Orthopedic Surgery  This document was dictated using Dragon voice recognition software. A reasonable attempt at proof reading has been made to minimize errors.

## 2023-12-07 ENCOUNTER — Encounter: Payer: Self-pay | Admitting: Internal Medicine

## 2023-12-07 NOTE — Progress Notes (Signed)
 Outside notes received. Information abstracted. Notes sent to scan.

## 2023-12-19 ENCOUNTER — Ambulatory Visit (HOSPITAL_BASED_OUTPATIENT_CLINIC_OR_DEPARTMENT_OTHER): Payer: 59 | Admitting: Obstetrics & Gynecology

## 2023-12-19 ENCOUNTER — Encounter (HOSPITAL_BASED_OUTPATIENT_CLINIC_OR_DEPARTMENT_OTHER): Payer: Self-pay | Admitting: Radiology

## 2023-12-19 ENCOUNTER — Encounter (HOSPITAL_BASED_OUTPATIENT_CLINIC_OR_DEPARTMENT_OTHER): Payer: Self-pay | Admitting: Obstetrics & Gynecology

## 2023-12-19 ENCOUNTER — Ambulatory Visit (HOSPITAL_BASED_OUTPATIENT_CLINIC_OR_DEPARTMENT_OTHER)
Admission: RE | Admit: 2023-12-19 | Discharge: 2023-12-19 | Disposition: A | Discharge status: Home / Self Care | Payer: 59 | Source: Ambulatory Visit | Attending: Internal Medicine | Admitting: Internal Medicine

## 2023-12-19 VITALS — BP 127/63 | HR 79 | Ht 62.0 in | Wt 236.0 lb

## 2023-12-19 DIAGNOSIS — E119 Type 2 diabetes mellitus without complications: Secondary | ICD-10-CM

## 2023-12-19 DIAGNOSIS — Z01419 Encounter for gynecological examination (general) (routine) without abnormal findings: Secondary | ICD-10-CM | POA: Diagnosis not present

## 2023-12-19 DIAGNOSIS — Z1231 Encounter for screening mammogram for malignant neoplasm of breast: Secondary | ICD-10-CM | POA: Diagnosis present

## 2023-12-19 DIAGNOSIS — D251 Intramural leiomyoma of uterus: Secondary | ICD-10-CM | POA: Diagnosis not present

## 2023-12-19 DIAGNOSIS — N912 Amenorrhea, unspecified: Secondary | ICD-10-CM

## 2023-12-19 DIAGNOSIS — Z975 Presence of (intrauterine) contraceptive device: Secondary | ICD-10-CM

## 2023-12-19 NOTE — Progress Notes (Signed)
 ANNUAL EXAM Patient name: Jill [name] MRN [id number]  Date of birth: 1960-08-15 Chief Complaint:   Annual Exam  History of Present Illness:   Jill [name] is a 63 y.o. G39P1001 African-American female being seen today for a routine annual exam.  IUD due for removal this year.  Pt wants to be sure does not need another one.  Denies vaginal bleeding.    Having some issues with hip pain and seeing Dr. Hermina Loosen.  Had a steroid injection and this didn't help.  So now considering surgery.    Last hbA1C was 6.8 in March.    No LMP recorded. (Menstrual status: IUD).   Last pap  08/30/2021 . Results were: NILM w/ HRHPV negative. H/O abnormal pap: yes remote hx Last mammogram: 12/19/2023. Results are pending. Family h/o breast cancer: yes paternal aunt and paternal GM Last colonoscopy: 03/24/2015 . Results were: normal. F/u 10 years. Family h/o colorectal cancer: no DEXA:  10/2020, -1.3 t score in femoral neck     12/19/2023   10:35 AM 10/31/2023    9:03 AM 09/05/2022    3:08 PM 06/23/2022   10:13 AM 12/21/2021   10:32 AM  Depression screen PHQ 2/9  Decreased Interest 0 0 0 0 0  Down, Depressed, Hopeless 0 0 0 0 0  PHQ - 2 Score 0 0 0 0 0  Altered sleeping    0 1  Tired, decreased energy    0 1  Change in appetite    0 1  Feeling bad or failure about yourself     0 0  Trouble concentrating    0 1  Moving slowly or fidgety/restless    0 0  Suicidal thoughts    0 0  PHQ-9 Score    0 4  Difficult doing work/chores    Not difficult at all Not difficult at all    Review of Systems:   Pertinent items are noted in HPI  Denies any bladder or bowel changes Pertinent History Reviewed:  Reviewed past medical,surgical, social and family history.  Reviewed problem list, medications and allergies. Physical Assessment:   Vitals:   12/19/23 1033  BP: 127/63  Pulse: 79  Weight: 236 lb (107 kg)  Height: 5\' 2"  (1.575 m)  Body mass index is 43.16 kg/m.        Physical Examination:    General appearance - well appearing, and in no distress  Mental status - alert, oriented to person, place, and time  Psych:  She has a normal mood and affect  Skin - warm and dry, normal color, no suspicious lesions noted  Chest - effort normal, all lung fields clear to auscultation bilaterally  Heart - normal rate and regular rhythm  Neck:  midline trachea, no thyromegaly or nodules  Breasts - breasts appear normal, no suspicious masses, no skin or nipple changes or  axillary nodes  Abdomen - soft, nontender, nondistended, no masses or organomegaly  Pelvic - VULVA: normal appearing vulva with no masses, tenderness or lesions   VAGINA: normal appearing vagina with normal color and discharge, no lesions   CERVIX: normal appearing cervix without discharge or lesions, no CMT  Thin prep pap is not obtained today  UTERUS: uterus is felt to be normal size, shape, consistency and nontender   ADNEXA: No adnexal masses or tenderness noted.  Rectal - normal rectal, good sphincter tone, no masses felt.    Extremities:  No swelling or varicosities noted  Chaperone present for  exam  Assessment & Plan:  1. Well woman exam with routine gynecological exam (Primary) - Pap smear 2023.  Will plan to repeat next year.  - Mammogram done today.  No results yet. - Colonoscopy 2016.  Follow up 10 years. - Bone mineral density 2022, t score -1.3.   - lab work done 10/2023 - vaccines reviewed/updated.  Did not have shingles vaccine as she had chicken pox vaccine completed.  2. Amenorrhea - Follicle stimulating hormone  3. Fibroids, intramural - stable exam  4. Type 2 diabetes mellitus without complication, without long-term current use of insulin  (HCC)  5. IUD (intrauterine device) in place   Orders Placed This Encounter  Procedures   Follicle stimulating hormone    Meds: No orders of the defined types were placed in this encounter.   Follow-up: Return in about 1 year (around  12/18/2024).  [name], MD 12/19/2023 11:29 AM

## 2023-12-20 LAB — FOLLICLE STIMULATING HORMONE: FSH: 45.2 m[IU]/mL (ref 25.8–134.8)

## 2023-12-26 ENCOUNTER — Other Ambulatory Visit (HOSPITAL_COMMUNITY): Payer: Self-pay

## 2024-01-05 ENCOUNTER — Encounter (HOSPITAL_BASED_OUTPATIENT_CLINIC_OR_DEPARTMENT_OTHER): Payer: Self-pay | Admitting: Obstetrics & Gynecology

## 2024-01-08 ENCOUNTER — Ambulatory Visit (HOSPITAL_BASED_OUTPATIENT_CLINIC_OR_DEPARTMENT_OTHER): Payer: Self-pay | Admitting: Obstetrics & Gynecology

## 2024-01-08 NOTE — Telephone Encounter (Signed)
 Per Annabell Key, patient was notified of results and recommendations on 01/08/2024.

## 2024-01-08 NOTE — Telephone Encounter (Signed)
See lab documentation

## 2024-01-24 ENCOUNTER — Encounter: Payer: Self-pay | Admitting: Podiatry

## 2024-01-24 ENCOUNTER — Ambulatory Visit: Admitting: Podiatry

## 2024-01-24 DIAGNOSIS — B351 Tinea unguium: Secondary | ICD-10-CM | POA: Diagnosis not present

## 2024-01-24 DIAGNOSIS — E1149 Type 2 diabetes mellitus with other diabetic neurological complication: Secondary | ICD-10-CM

## 2024-01-24 DIAGNOSIS — M79674 Pain in right toe(s): Secondary | ICD-10-CM

## 2024-01-24 DIAGNOSIS — M79675 Pain in left toe(s): Secondary | ICD-10-CM | POA: Diagnosis not present

## 2024-01-24 DIAGNOSIS — E1140 Type 2 diabetes mellitus with diabetic neuropathy, unspecified: Secondary | ICD-10-CM

## 2024-01-25 NOTE — Progress Notes (Signed)
 Subjective:   Patient ID: Jill [name], female   DOB: 63 y.o.   MRN: [id number]   HPI Patient presents stating that she has got very thickened nails that she cannot take care of herself and she does have diabetes that she would like checked.  States they get sore and it is hard and she is nervous because of her diabetes and she does try to keep it under good control does not smoke likes to be active   Review of Systems  All other systems reviewed and are negative.       Objective:  Physical Exam Vitals and nursing note reviewed.  Constitutional:      Appearance: She is well-developed.  Pulmonary:     Effort: Pulmonary effort is normal.   Musculoskeletal:        General: Normal range of motion.   Skin:    General: Skin is warm.   Neurological:     Mental Status: She is alert.     Neurovascular status intact muscle strength was found to be moderately diminished with diminishment of sharp dull vibratory consistent with diabetes.  Patient does have significant thickness of nailbeds 1-5 both feet dystrophic painful and request inability for her to cut them herself and she is scared about doing this     Assessment:  Chronic mycotic nail infection patient with diabetes that I explained overall is in pretty good control but she needs to be careful as far as foot care and doing daily foot inspections     Plan:  H&P reviewed and went ahead today and debrided nailbeds 1-5 both feet no iatrogenic bleeding and reappoint for routine care and also I want her to do daily inspections with footcare discussed with patient at today's visit

## 2024-03-02 ENCOUNTER — Other Ambulatory Visit: Payer: Self-pay | Admitting: Internal Medicine

## 2024-04-09 ENCOUNTER — Encounter: Payer: Self-pay | Admitting: Sports Medicine

## 2024-04-26 ENCOUNTER — Encounter: Payer: Self-pay | Admitting: Internal Medicine

## 2024-04-26 ENCOUNTER — Ambulatory Visit: Admitting: Internal Medicine

## 2024-04-26 VITALS — BP 136/82 | HR 83 | Ht 62.0 in | Wt 245.0 lb

## 2024-04-26 DIAGNOSIS — E1169 Type 2 diabetes mellitus with other specified complication: Secondary | ICD-10-CM | POA: Diagnosis not present

## 2024-04-26 LAB — POCT GLYCOSYLATED HEMOGLOBIN (HGB A1C): Hemoglobin A1C: 7.1 % — AB (ref 4.0–5.6)

## 2024-04-26 LAB — POCT GLUCOSE (DEVICE FOR HOME USE): Glucose Fasting, POC: 177 mg/dL — AB (ref 70–99)

## 2024-04-26 MED ORDER — GLIMEPIRIDE 1 MG PO TABS: 1.0000 mg | ORAL_TABLET | Freq: Every day | ORAL | 3 refills | Status: DC

## 2024-04-26 MED ORDER — TOPIRAMATE 50 MG PO TABS: 50.0000 mg | ORAL_TABLET | Freq: Every day | ORAL | 3 refills | Status: DC

## 2024-04-26 MED ORDER — METFORMIN HCL ER 750 MG PO TB24: 750.0000 mg | ORAL_TABLET | Freq: Two times a day (BID) | ORAL | 3 refills | Status: DC

## 2024-04-26 MED ORDER — EMPAGLIFLOZIN 25 MG PO TABS: 25.0000 mg | ORAL_TABLET | Freq: Every day | ORAL | 3 refills | Status: DC

## 2024-04-26 MED ORDER — ROSUVASTATIN CALCIUM 10 MG PO TABS: 10.0000 mg | ORAL_TABLET | Freq: Every day | ORAL | 3 refills | Status: DC

## 2024-04-26 NOTE — Patient Instructions (Addendum)
 You can get liquid Skin-Tac to apply to the skin before the applying the sensor   Start topiramate  50 mg, half a tablet at bedtime for 2 weeks, then increase to 1 tablet at bedtime daily  - Continue Glimepiride  1 mg daily  - Continue Metformin  750 mg twice daily  - Continue Jardiance  25 mg daily    HOW TO TREAT LOW BLOOD SUGARS (Blood sugar LESS THAN 70 MG/DL) Please follow the RULE OF 15 for the treatment of hypoglycemia treatment (when your (blood sugars are less than 70 mg/dL)   STEP 1: Take 15 grams of carbohydrates when your blood sugar is low, which includes:  3-4 GLUCOSE TABS  OR 3-4 OZ OF JUICE OR REGULAR SODA OR ONE TUBE OF GLUCOSE GEL    STEP 2: RECHECK blood sugar in 15 MINUTES STEP 3: If your blood sugar is still low at the 15 minute recheck --> then, go back to STEP 1 and treat AGAIN with another 15 grams of carbohydrates. Stop Jardiance  stop Jardiance  start Synjardy  12 point 12-998, 2 tabs daily Synjardy  12.12-998 mg,

## 2024-04-26 NOTE — Progress Notes (Signed)
 Name: Jill [name]  Age/ Sex: 63 y.o., female   MRN/ DOB: [id number], 1961-01-25     PCP: Geofm Glade PARAS, MD   Reason for Endocrinology Evaluation: Type 2 Diabetes Mellitus/ Hypercalcemia   Initial Endocrine Consultative Visit: 06/25/2020    PATIENT IDENTIFIER: Jill [name] is a 63 y.o. female with a past medical history of T2DM, HTN, Ashtma and Primary Hyperparathyroidism ( S/P parathyroidectomy 2022). The patient has followed with Endocrinology clinic since 06/25/2020 for consultative assistance with management of her diabetes.  DIABETIC HISTORY:  Jill [name] was diagnosed with DM > 30 yrs ago. Ozempic  caused GI side effects. No reported intolerance to Trajenta .She was started on Glipizide  and Jardiance  in 08/2020 Her hemoglobin A1c has ranged from  6.7% in 2019 to 11.7% in 2021   Glipizide  was stopped by PCP , we restarted at smaller dose in 12/2020   We stopped Kombiglyze and switch Jardiance  to Synjardy  by April 2023 Started Trulicity  April 2023, this was eventually switched to Mounjaro  in 2023  Glipizide  stopped 03/2022 with an A1c of 6.7%   Glimepiride  started 09/2022 with an A1c of 9.4%  Mounjaro  discontinued 2024 due to cost  Was provided with pt assistance forms 06/2023 for Jardiance  but wasn't filled    Hyperparathyroid History  The patient was first diagnosed with hypercalcemia in 2015, with maximum serum calcium  level of  level 11.2 mg/dL ( Non corrected)    Her 24- hr urinary Ca/Cr ratio of 0.012 with a 191 mg of 24-hr calcium  (06/2020) DXA (10/06/2020) low bone density    She met surgical criteria  due to low GFR  She is S/P left inferior parathyroidectomy 05/24/2021  SUBJECTIVE:   During the last visit (10/25/2023): A1c 6.8 %     Today (04/26/2024): Jill [name] is here for follow-up on diabetes management.  She was using Dexcom to check glucose multiple times daily, but this became cost prohibitive , but hasn't been using glucose meter . Pt states  she has not budget to buy strips     Patient has been noted weight gain since her last visit here She is having increase sugar cravings , she is drinking regular soda, down from 4 cans a day to one  She denies depression but has been stressed, as mother had cardiac issues  No  nausea or vomiting  No  constipation or diarrhea  Chadha follow-up with podiatry June, 2025   HOME DIABETES REGIMEN:  -Glimepiride  1 mg daily -Metformin  750 mg BID  -Jardiance  25 daily -Rosuvastatin  10 mg daily     Statin: yes ACE-I/ARB: yes    CONTINUOUS GLUCOSE MONITORING RECORD INTERPRETATION    Dates of Recording:   8/16-8/29/2025  Sensor description:dexcom  Results statistics:   CGM use % of time 97  Average and SD 198/65  Time in range 48  % Time Above 180 31  % Time above 250 21  % Time Below target 0   Glycemic patterns summary: BGs trend down overnight and fluctuate throughout the day  Hyperglycemic episodes  postprandial   Hypoglycemic episodes occurred N/A  Overnight periods: optimal     DIABETIC COMPLICATIONS: Microvascular complications:  CKD III Denies: Neuropathy, retinopathy Last Eye Exam: Completed 03/2021  Macrovascular complications:   Denies: CAD, CVA, PVD   HISTORY:  Past Medical History:  Past Medical History:  Diagnosis Date   Allergy    Anemia    Arthritis    Asthma    30yrs ago   Chronic deafness  of both ears    left ear has 40% hearing-right ear has 60 % hearing   Colon polyps    Complication of anesthesia    Hard to wake up   Constipation    Diabetes mellitus without complication (HCC)    Heartburn    High cholesterol    Migraines    none in last 11 yrs   Obesity    Pelvic kidney    left   Pneumonia    Post partum depression    Urinary incontinence    Past Surgical History:  Past Surgical History:  Procedure Laterality Date   CESAREAN SECTION     COLONOSCOPY WITH PROPOFOL  N/A 03/24/2015   Procedure: COLONOSCOPY WITH PROPOFOL ;   Surgeon: Renaye Sous, MD;  Location: [facility];  Service: Endoscopy;  Laterality: N/A;   HEMORRHOID BANDING  08/2017, 09/2017   KNEE ARTHROSCOPY WITH MEDIAL MENISECTOMY Left 04/17/2014   Procedure: LEFT KNEE ARTHROSCOPY WITH PARTIAL MEDIAL MENISCECTOMY AND CHONDROPLASTY;  Surgeon: Reyes JAYSON Billing, MD;  Location: [facility];  Service: Orthopedics;  Laterality: Left;   PARATHYROIDECTOMY Left 05/24/2021   Procedure: LEFT INFERIOR PARATHYROIDECTOMY, POSSIBLE NECK EXPLORATION;  Surgeon: Eletha Boas, MD;  Location: [facility];  Service: General;  Laterality: Left;   TYMPANOSTOMY TUBE PLACEMENT Right    Social History:  reports that she has never smoked. She has never used smokeless tobacco. She reports that she does not currently use alcohol. She reports that she does not use drugs. Family History:  Family History  Problem Relation Age of Onset   Hypertension Mother    Arthritis Mother    Hypercalcemia Mother    Obesity Mother    Allergic rhinitis Mother    Asthma Mother    Hypertension Sister    Deep vein thrombosis Sister    Pulmonary embolism Sister    Asthma Daughter    Diabetes Maternal Grandmother    Heart failure Maternal Grandmother    Heart disease Maternal Grandmother    Hyperlipidemia Maternal Grandmother    Hypertension Maternal Grandmother    Hypertension Maternal Grandfather    Hyperlipidemia Maternal Grandfather    Breast cancer Paternal Aunt        diagnosed in her 17's   Breast cancer Paternal Grandmother      HOME MEDICATIONS: Allergies as of 04/26/2024       Reactions   Linzess  [linaclotide ] Other (See Comments)   Abdominal pain    Pravastatin  Other (See Comments)   Body aches   Adhesive [tape]    Pulls skin off   Atorvastatin     Muscle aches, stomach upset   Latex Itching        Medication List        Accurate as of April 26, 2024  8:12 AM. If you have any questions, ask your nurse or doctor.          Blood Glucose Monitoring Suppl Devi 1 each  by Does not apply route in the morning, at noon, and at bedtime. May substitute to any manufacturer covered by patient's insurance. E11.9   cetirizine  10 MG tablet Commonly known as: ZYRTEC  Take 1 tablet (10 mg total) by mouth 2 (two) times daily.   chlorhexidine  0.12 % solution Commonly known as: PERIDEX  Use as directed 15 mLs in the mouth or throat in the morning and at bedtime.   Dexcom G7 Sensor Misc UAD to monitor sugars  E11.9   empagliflozin  25 MG Tabs tablet Commonly known as: Jardiance  Take 1 tablet (25  mg total) by mouth daily before breakfast.   glimepiride  1 MG tablet Commonly known as: AMARYL  Take 1 tablet (1 mg total) by mouth daily with breakfast.   Glucose Control Soln UAD.   Training and development officer by insurance.  E11.9   losartan  25 MG tablet Commonly known as: COZAAR  TAKE 1 TABLET BY MOUTH DAILY   metFORMIN  750 MG 24 hr tablet Commonly known as: GLUCOPHAGE -XR Take 1 tablet (750 mg total) by mouth 2 (two) times daily.   rosuvastatin  10 MG tablet Commonly known as: Crestor  Take 1 tablet (10 mg total) by mouth daily.   tirzepatide  2.5 MG/0.5ML Pen Commonly known as: MOUNJARO  Inject 2.5 mg into the skin once a week.   triamcinolone  ointment 0.1 % Commonly known as: KENALOG  APPLY TOPICALLY TO AFFECTED  AREA(S) TWICE DAILY         OBJECTIVE:   Vital Signs: BP 136/82 (BP Location: Left Arm, Patient Position: Sitting, Cuff Size: Normal)   Pulse 83   Ht 5' 2 (1.575 m)   Wt 245 lb (111.1 kg)   SpO2 96%   BMI 44.81 kg/m   Wt Readings from Last 3 Encounters:  04/26/24 245 lb (111.1 kg)  12/19/23 236 lb (107 kg)  10/31/23 239 lb (108.4 kg)     Exam: General: Pt appears well and is in NAD  Lungs: Clear with good BS bilat   Heart: RRR   Extremities: Trace pretibial edema.   Neuro: MS is good with appropriate affect, pt is alert and Ox3     DM Foot Exam 01/24/2024 per podiatry     DATA REVIEWED:  Lab Results  Component Value Date    HGBA1C 7.1 (A) 04/26/2024   HGBA1C 6.8 (A) 10/25/2023   HGBA1C 6.3 05/03/2023    Latest Reference Range & Units 11/01/23 08:59  Comprehensive metabolic panel with GFR  Rpt !  Sodium 135 - 145 mEq/L 138  Potassium 3.5 - 5.1 mEq/L 4.3  Chloride 96 - 112 mEq/L 104  CO2 19 - 32 mEq/L 25  Glucose 70 - 99 mg/dL 853 (H)  BUN 6 - 23 mg/dL 21  Creatinine 9.59 - 8.79 mg/dL 8.94  Calcium  8.4 - 10.5 mg/dL 9.4  Alkaline Phosphatase 39 - 117 U/L 39 - 117 U/L 73 73  Albumin 3.5 - 5.2 g/dL 3.5 - 5.2 g/dL 4.5 4.5  AST 0 - 37 U/L 0 - 37 U/L 15 15  ALT 0 - 35 U/L 0 - 35 U/L 16 16  Total Protein 6.0 - 8.3 g/dL 6.0 - 8.3 g/dL 7.8 7.8  Bilirubin, Direct 0.0 - 0.3 mg/dL 0.1  Total Bilirubin 0.2 - 1.2 mg/dL 0.2 - 1.2 mg/dL 0.4 0.4  GFR >39.99 mL/min 57.32 (L)    Latest Reference Range & Units 11/01/23 08:59  Total CHOL/HDL Ratio  3  Cholesterol 0 - 200 mg/dL 883  HDL Cholesterol >60.99 mg/dL 57.39  LDL (calc) 0 - 99 mg/dL 62  MICROALB/CREAT RATIO 0.0 - 30.0 mg/g 7.4  NonHDL  73.15  Triglycerides 0.0 - 149.0 mg/dL 44.9  VLDL 0.0 - 59.9 mg/dL 88.9     Old records , labs and images have been reviewed.   ASSESSMENT / PLAN / RECOMMENDATIONS:   1) Type 2 Diabetes Mellitus, Optimally  controlled, Without complications - Most recent A1c of 7.1 %. Goal A1c < 7.0 %.    - A1c slightly increased, patient admits to sugar cravings, she is working on trying to offset the cravings, Mounjaro  has been cost prohibitive -Dexcom  has been cost prohibitive - Patient with social determinants, patient states she is unable to afford glucose testing strips even with $0.20 -No changes to glycemic agents at this time, but I have recommended starting her on topiramate  to see if that would help with cravings -Patient advised to obtain skin TAC to help with sensor adherence    MEDICATIONS: - Contine Glimepiride  1 mg, 1 tablet before breakfast - Continue metformin  750 mg twice daily - Continue Jardiance  25 mg  daily    EDUCATION / INSTRUCTIONS: BG monitoring instructions: Patient is instructed to check her blood sugars 3  times a day, before meals . Call [facility] if: BG persistently < 70 I reviewed the Rule of 15 for the treatment of hypoglycemia in detail with the patient. Literature supplied.  2) Diabetic complications:  Eye: Does not have known diabetic retinopathy.  Neuro/ Feet: Does not have known diabetic peripheral neuropathy .  Renal: Patient does  have known baseline CKD. She   is not on an ACEI/ARB at present.      3) Dyslipidemia:   -She has reported prior  intolerance to daily rosuvastatin  and atorvastatin  in the past   - We stopped Pravastatin  and started weekly Rosuvastatin  12/2020 -Following parathyroidectomy, the patient has noted resolution of joint aches and pains and increased rosuvastatin  to daily without any side effects 2024  - LDL at goal   Medication   Continue Rosuvastatin  10 mg daily     4) Weight gain  -She is having difficulty controlling her cravings - GLP-1 agonist have been cost prohibitive - I have recommended topiramate , caution against tingling and numbness  Medication Topiramate  50 mg, half a tablet at bedtime for 2 weeks, then increase to 1 tablet at bedtime    F/U in 4months     Signed electronically by: Stefano Redgie Butts, MD  [facility]  [facility] [address] [place], [place] [address] Phone: [phone] FAX: [phone]   CC: Geofm Glade PARAS, MD [address] [place] [address] Phone: [phone]  Fax: [phone]  Return to Endocrinology clinic as below: Future Appointments  Date Time Provider Department Center  08/20/2024  9:30 AM Geofm Glade PARAS, MD [facility]  12/23/2024 10:35 AM Cleotilde Ronal RAMAN, MD [facility] [id number]  01/23/2025  1:15 PM [place], [place] RAMAN, DPM [facility]

## 2024-05-09 ENCOUNTER — Encounter: Admitting: Internal Medicine

## 2024-06-09 ENCOUNTER — Encounter: Payer: Self-pay | Admitting: Internal Medicine

## 2024-06-10 ENCOUNTER — Encounter: Payer: Self-pay | Admitting: Radiology

## 2024-06-10 ENCOUNTER — Other Ambulatory Visit (HOSPITAL_COMMUNITY): Payer: Self-pay

## 2024-06-10 ENCOUNTER — Other Ambulatory Visit: Payer: Self-pay

## 2024-06-10 MED ORDER — LOSARTAN POTASSIUM 25 MG PO TABS
25.0000 mg | ORAL_TABLET | Freq: Every day | ORAL | 3 refills | Status: DC
  Filled 2024-06-10: qty 30, 30d supply, fill #0
  Filled 2024-06-20: qty 90, 90d supply, fill #0

## 2024-06-20 ENCOUNTER — Other Ambulatory Visit (HOSPITAL_COMMUNITY): Payer: Self-pay

## 2024-07-17 ENCOUNTER — Ambulatory Visit: Admitting: Internal Medicine

## 2024-07-28 ENCOUNTER — Encounter: Payer: Self-pay | Admitting: Internal Medicine

## 2024-07-28 DIAGNOSIS — E1169 Type 2 diabetes mellitus with other specified complication: Secondary | ICD-10-CM

## 2024-07-29 ENCOUNTER — Other Ambulatory Visit: Payer: Self-pay

## 2024-07-29 ENCOUNTER — Other Ambulatory Visit (HOSPITAL_COMMUNITY): Payer: Self-pay

## 2024-07-29 MED ORDER — TOPIRAMATE 50 MG PO TABS
50.0000 mg | ORAL_TABLET | Freq: Every day | ORAL | 3 refills | Status: AC
  Filled 2024-07-29: qty 30, 30d supply, fill #0

## 2024-07-29 MED ORDER — TRIAMCINOLONE ACETONIDE 0.1 % EX OINT
1.0000 | TOPICAL_OINTMENT | Freq: Two times a day (BID) | CUTANEOUS | 3 refills | Status: DC
  Filled 2024-07-29: qty 80, 30d supply, fill #0

## 2024-07-29 MED ORDER — GLIMEPIRIDE 1 MG PO TABS
1.0000 mg | ORAL_TABLET | Freq: Every day | ORAL | 3 refills | Status: DC
  Filled 2024-07-29: qty 90, 90d supply, fill #0

## 2024-07-29 MED ORDER — ROSUVASTATIN CALCIUM 10 MG PO TABS
10.0000 mg | ORAL_TABLET | Freq: Every day | ORAL | 3 refills | Status: DC
  Filled 2024-07-29: qty 90, 90d supply, fill #0

## 2024-07-29 MED ORDER — METFORMIN HCL ER 750 MG PO TB24
750.0000 mg | ORAL_TABLET | Freq: Two times a day (BID) | ORAL | 3 refills | Status: DC
  Filled 2024-07-29: qty 180, 90d supply, fill #0

## 2024-07-30 ENCOUNTER — Other Ambulatory Visit (HOSPITAL_COMMUNITY): Payer: Self-pay

## 2024-08-02 MED ORDER — GLIMEPIRIDE 1 MG PO TABS: 1.0000 mg | ORAL_TABLET | Freq: Every day | ORAL | 3 refills | Status: AC

## 2024-08-02 MED ORDER — ROSUVASTATIN CALCIUM 10 MG PO TABS: 10.0000 mg | ORAL_TABLET | Freq: Every day | ORAL | 3 refills | Status: AC

## 2024-08-02 MED ORDER — METFORMIN HCL ER 750 MG PO TB24: 750.0000 mg | ORAL_TABLET | Freq: Two times a day (BID) | ORAL | 3 refills | Status: AC

## 2024-08-02 MED ORDER — TOPIRAMATE 50 MG PO TABS: 50.0000 mg | ORAL_TABLET | Freq: Every day | ORAL | 3 refills | Status: DC

## 2024-08-02 NOTE — Addendum Note (Signed)
 Addended by: Kimya Mccahill M on: 08/02/2024 10:34 AM   Modules accepted: Orders

## 2024-08-18 ENCOUNTER — Encounter: Payer: Self-pay | Admitting: Internal Medicine

## 2024-08-18 NOTE — Patient Instructions (Addendum)
 "     Blood work was ordered.       Medications changes include :   None    A referral was ordered and someone will call you to schedule an appointment.     Return in about 6 months (around 02/17/2025) for follow up.   Health Maintenance, Female Adopting a healthy lifestyle and getting preventive care are important in promoting health and wellness. Ask your health care provider about: The right schedule for you to have regular tests and exams. Things you can do on your own to prevent diseases and keep yourself healthy. What should I know about diet, weight, and exercise? Eat a healthy diet  Eat a diet that includes plenty of vegetables, fruits, low-fat dairy products, and lean protein. Do not eat a lot of foods that are high in solid fats, added sugars, or sodium. Maintain a healthy weight Body mass index (BMI) is used to identify weight problems. It estimates body fat based on height and weight. Your health care provider can help determine your BMI and help you achieve or maintain a healthy weight. Get regular exercise Get regular exercise. This is one of the most important things you can do for your health. Most adults should: Exercise for at least 150 minutes each week. The exercise should increase your heart rate and make you sweat (moderate-intensity exercise). Do strengthening exercises at least twice a week. This is in addition to the moderate-intensity exercise. Spend less time sitting. Even light physical activity can be beneficial. Watch cholesterol and blood lipids Have your blood tested for lipids and cholesterol at 63 years of age, then have this test every 5 years. Have your cholesterol levels checked more often if: Your lipid or cholesterol levels are high. You are older than 63 years of age. You are at high risk for heart disease. What should I know about cancer screening? Depending on your health history and family history, you may need to have cancer  screening at various ages. This may include screening for: Breast cancer. Cervical cancer. Colorectal cancer. Skin cancer. Lung cancer. What should I know about heart disease, diabetes, and high blood pressure? Blood pressure and heart disease High blood pressure causes heart disease and increases the risk of stroke. This is more likely to develop in people who have high blood pressure readings or are overweight. Have your blood pressure checked: Every 3-5 years if you are 43-89 years of age. Every year if you are 58 years old or older. Diabetes Have regular diabetes screenings. This checks your fasting blood sugar level. Have the screening done: Once every three years after age 74 if you are at a normal weight and have a low risk for diabetes. More often and at a younger age if you are overweight or have a high risk for diabetes. What should I know about preventing infection? Hepatitis B If you have a higher risk for hepatitis B, you should be screened for this virus. Talk with your health care provider to find out if you are at risk for hepatitis B infection. Hepatitis C Testing is recommended for: Everyone born from [birth year] through 1964. Anyone with known risk factors for hepatitis C. Sexually transmitted infections (STIs) Get screened for STIs, including gonorrhea and chlamydia, if: You are sexually active and are younger than 63 years of age. You are older than 63 years of age and your health care provider tells you that you are at risk for this type of infection. Your sexual activity  has changed since you were last screened, and you are at increased risk for chlamydia or gonorrhea. Ask your health care provider if you are at risk. Ask your health care provider about whether you are at high risk for HIV. Your health care provider may recommend a prescription medicine to help prevent HIV infection. If you choose to take medicine to prevent HIV, you should first get tested for HIV. You  should then be tested every 3 months for as long as you are taking the medicine. Pregnancy If you are about to stop having your period (premenopausal) and you may become pregnant, seek counseling before you get pregnant. Take 400 to 800 micrograms (mcg) of folic acid every day if you become pregnant. Ask for birth control (contraception) if you want to prevent pregnancy. Osteoporosis and menopause Osteoporosis is a disease in which the bones lose minerals and strength with aging. This can result in bone fractures. If you are 81 years old or older, or if you are at risk for osteoporosis and fractures, ask your health care provider if you should: Be screened for bone loss. Take a calcium or vitamin D  supplement to lower your risk of fractures. Be given hormone replacement therapy (HRT) to treat symptoms of menopause. Follow these instructions at home: Alcohol use Do not drink alcohol if: Your health care provider tells you not to drink. You are pregnant, may be pregnant, or are planning to become pregnant. If you drink alcohol: Limit how much you have to: 0-1 drink a day. Know how much alcohol is in your drink. In the [place] one drink equals one 12 oz bottle of beer (355 mL), one 5 oz glass of wine (148 mL), or one 1 oz glass of hard liquor (44 mL). Lifestyle Do not use any products that contain nicotine or tobacco. These products include cigarettes, chewing tobacco, and vaping devices, such as e-cigarettes. If you need help quitting, ask your health care provider. Do not use street drugs. Do not share needles. Ask your health care provider for help if you need support or information about quitting drugs. General instructions Schedule regular health, dental, and eye exams. Stay current with your vaccines. Tell your health care provider if: You often feel depressed. You have ever been abused or do not feel safe at home. Summary Adopting a healthy lifestyle and getting preventive care are  important in promoting health and wellness. Follow your health care provider's instructions about healthy diet, exercising, and getting tested or screened for diseases. Follow your health care provider's instructions on monitoring your cholesterol and blood pressure. This information is not intended to replace advice given to you by your health care provider. Make sure you discuss any questions you have with your health care provider. Document Revised: 12/14/2020 Document Reviewed: 12/14/2020 Elsevier Patient Education  2024 [employer]. "

## 2024-08-18 NOTE — Progress Notes (Unsigned)
 "   Subjective:    Patient ID: Jill [name], female    DOB: 1961-01-21, 64 y.o.   MRN: [id number]      HPI Jill [name] is here for a Physical exam and her chronic medical problems.    Discussed the use of AI scribe software for clinical note transcription with the patient, who gave verbal consent to proceed.  History of Present Illness Jill [name] is a 64 year old female who presents with chronic itching.  She has been experiencing persistent itching for over a year, primarily affecting her hands, behind her ears, under her breasts, and under her stomach. The itching is intense, with a desire to scratch beneath the skin. Little bumps are present on her hand along with a callus-like spot, but there is no redness or rash. Showering temporarily relieves the itching, although hot water causes pain.  She uses triamcinolone  ointment daily after showering at night, which soothes the itching and prevents dryness. An unspecified lotion is used in the morning. Various antihistamines, including Zyrtec  and Claritin, have been tried but are ineffective. She alternates between Tide and [employer] laundry detergents, both dye-free, and uses fragrance-free products, but these changes have not improved her symptoms.  She has a history of using a cream prescribed by a dermatologist for non-exfoliating skin but has not consulted a dermatologist specifically for the itching. An allergist was previously consulted, but no allergies were identified.  She is currently taking several medications, including Jardiance , glimepiride , losartan , rosuvastatin , metformin , and topiramate . Jardiance  has not been used for the past 30 days due to insurance changes.  She reports a scratchy throat and ear pain, possibly related to sinus issues, and a runny nose for the past two days. No fever, chest pain, palpitations, leg swelling, heartburn, abdominal pain, diarrhea, constipation, blood in stool, pain with urination,  joint pain, back pain, headaches, lightheadedness, numbness, tingling, depression, or anxiety. She mentions transient vision loss in her right eye, occurring every few months, lasting a few seconds each time. Her eye doctor has not found any issues but suggested mentioning it to her primary care provider.  She is currently not engaging in regular exercise due to caregiving responsibilities for her mother, who has lupus and other health issues.     Medications and allergies reviewed with patient and updated if appropriate.  Medications Ordered Prior to Encounter[1]  Review of Systems  Constitutional:  Negative for fever.  HENT:  Positive for ear pain (today), rhinorrhea and sore throat (scratchy today). Negative for congestion and sinus pressure.   Eyes:  Positive for visual disturbance (right eye occ goes black for 3-4 sec - rare - eye doctor did not find any issues).  Respiratory:  Negative for cough, shortness of breath and wheezing.   Cardiovascular:  Negative for chest pain, palpitations and leg swelling.  Gastrointestinal:  Negative for abdominal pain, blood in stool, constipation and diarrhea.       No gerd  Genitourinary:  Negative for dysuria.  Musculoskeletal:  Negative for arthralgias and back pain.  Skin:  Negative for rash.       itching  Neurological:  Negative for light-headedness, numbness and headaches.  Psychiatric/Behavioral:  Negative for dysphoric mood. The patient is not nervous/anxious.        Objective:   Vitals:   08/20/24 0944  BP: 132/70  Pulse: 70  Temp: 98.1 F (36.7 C)  SpO2: 100%   Filed Weights   08/20/24 0944  Weight: 241 lb (109.3  kg)   Body mass index is 44.08 kg/m.  BP Readings from Last 3 Encounters:  08/20/24 132/70  04/26/24 136/82  12/19/23 127/63    Wt Readings from Last 3 Encounters:  08/20/24 241 lb (109.3 kg)  04/26/24 245 lb (111.1 kg)  12/19/23 236 lb (107 kg)       Physical Exam Constitutional: She appears  well-developed and well-nourished. No distress.  HENT:  Head: Normocephalic and atraumatic.  Right Ear: External ear normal. Normal ear canal and TM Left Ear: External ear normal.  Normal ear canal and TM Mouth/Throat: Oropharynx is clear and moist.  Eyes: Conjunctivae normal.  Neck: Neck supple. No tracheal deviation present. No thyromegaly present.  No carotid bruit  Cardiovascular: Normal rate, regular rhythm and normal heart sounds.   No murmur heard.  No edema. Pulmonary/Chest: Effort normal and breath sounds normal. No respiratory distress. She has no wheezes. She has no rales.  Breast: deferred   Abdominal: Soft. She exhibits no distension. There is no tenderness.  Lymphadenopathy: She has no cervical adenopathy.  Skin: Skin is warm and dry. She is not diaphoretic.  No rash Psychiatric: She has a normal mood and affect. Her behavior is normal.     Lab Results  Component Value Date   WBC 4.5 11/01/2023   HGB 14.1 11/01/2023   HCT 42.8 11/01/2023   PLT 206.0 11/01/2023   GLUCOSE 146 (H) 11/01/2023   CHOL 116 11/01/2023   TRIG 55.0 11/01/2023   HDL 42.60 11/01/2023   LDLDIRECT 125 (H) 09/10/2014   LDLCALC 62 11/01/2023   ALT 16 11/01/2023   ALT 16 11/01/2023   AST 15 11/01/2023   AST 15 11/01/2023   NA 138 11/01/2023   K 4.3 11/01/2023   CL 104 11/01/2023   CREATININE 1.05 11/01/2023   BUN 21 11/01/2023   CO2 25 11/01/2023   TSH 1.29 12/15/2020   HGBA1C 7.1 (A) 04/26/2024   MICROALBUR 0.8 11/01/2023         Assessment & Plan:   Physical exam: Screening blood work  ordered Exercise  none - stressed regular exercise Weight  obese Substance abuse  none   Reviewed recommended immunizations.  Flu immunization administered today.     Health Maintenance  Topic Date Due   Influenza Vaccine  03/08/2024   COVID-19 Vaccine (4 - 2025-26 season) 09/03/2024 (Originally 04/08/2024)   HEMOGLOBIN A1C  10/24/2024   Diabetic kidney evaluation - eGFR measurement   10/31/2024   Diabetic kidney evaluation - Urine ACR  10/31/2024   OPHTHALMOLOGY EXAM  11/26/2024   FOOT EXAM  01/23/2025   Colonoscopy  03/23/2025   Mammogram  12/18/2025   Cervical Cancer Screening (HPV/Pap Cotest)  08/30/2026   DTaP/Tdap/Td (5 - Td or Tdap) 12/27/2026   Pneumococcal Vaccine: 50+ Years  Completed   Hepatitis C Screening  Completed   HIV Screening  Completed   Hepatitis B Vaccines 19-59 Average Risk  Aged Out   HPV VACCINES  Aged Out   Meningococcal B Vaccine  Aged Out   Zoster Vaccines- Shingrix  Discontinued          See Problem List for Assessment and Plan of chronic medical problems.        [1]  Current Outpatient Medications on File Prior to Visit  Medication Sig Dispense Refill   Blood Glucose Calibration (GLUCOSE CONTROL) SOLN UAD.   Training and development officer by insurance.  E11.9 1 each 0   Blood Glucose Monitoring Suppl DEVI 1 each by Does  not apply route in the morning, at noon, and at bedtime. May substitute to any manufacturer covered by patient's insurance. E11.9 1 each 0   cetirizine  (ZYRTEC ) 10 MG tablet Take 1 tablet (10 mg total) by mouth 2 (two) times daily. 180 tablet 3   empagliflozin  (JARDIANCE ) 25 MG TABS tablet Take 1 tablet (25 mg total) by mouth daily before breakfast. 90 tablet 3   glimepiride  (AMARYL ) 1 MG tablet Take 1 tablet (1 mg total) by mouth daily with breakfast. 90 tablet 3   metFORMIN  (GLUCOPHAGE -XR) 750 MG 24 hr tablet Take 1 tablet (750 mg total) by mouth 2 (two) times daily. 180 tablet 3   rosuvastatin  (CRESTOR ) 10 MG tablet Take 1 tablet (10 mg total) by mouth daily. 90 tablet 3   topiramate  (TOPAMAX ) 50 MG tablet Take 1 tablet (50 mg total) by mouth at bedtime. 90 tablet 3   No current facility-administered medications on file prior to visit.   "

## 2024-08-20 ENCOUNTER — Ambulatory Visit: Admitting: Internal Medicine

## 2024-08-20 VITALS — BP 132/70 | HR 70 | Temp 98.1°F | Ht 62.0 in | Wt 241.0 lb

## 2024-08-20 DIAGNOSIS — E1169 Type 2 diabetes mellitus with other specified complication: Secondary | ICD-10-CM

## 2024-08-20 DIAGNOSIS — H539 Unspecified visual disturbance: Secondary | ICD-10-CM | POA: Diagnosis not present

## 2024-08-20 DIAGNOSIS — Z0000 Encounter for general adult medical examination without abnormal findings: Secondary | ICD-10-CM | POA: Diagnosis not present

## 2024-08-20 DIAGNOSIS — L299 Pruritus, unspecified: Secondary | ICD-10-CM

## 2024-08-20 DIAGNOSIS — E785 Hyperlipidemia, unspecified: Secondary | ICD-10-CM | POA: Diagnosis not present

## 2024-08-20 DIAGNOSIS — Z7984 Long term (current) use of oral hypoglycemic drugs: Secondary | ICD-10-CM | POA: Diagnosis not present

## 2024-08-20 DIAGNOSIS — E559 Vitamin D deficiency, unspecified: Secondary | ICD-10-CM | POA: Diagnosis not present

## 2024-08-20 DIAGNOSIS — E1159 Type 2 diabetes mellitus with other circulatory complications: Secondary | ICD-10-CM

## 2024-08-20 DIAGNOSIS — I152 Hypertension secondary to endocrine disorders: Secondary | ICD-10-CM | POA: Diagnosis not present

## 2024-08-20 LAB — MICROALBUMIN / CREATININE URINE RATIO
Creatinine,U: 145.9 mg/dL
Microalb Creat Ratio: 13.6 mg/g (ref 0.0–30.0)
Microalb, Ur: 2 mg/dL — ABNORMAL HIGH (ref 0.7–1.9)

## 2024-08-20 LAB — COMPREHENSIVE METABOLIC PANEL WITH GFR
ALT: 14 U/L (ref 3–35)
AST: 11 U/L (ref 5–37)
Albumin: 4.1 g/dL (ref 3.5–5.2)
Alkaline Phosphatase: 81 U/L (ref 39–117)
BUN: 17 mg/dL (ref 6–23)
CO2: 25 meq/L (ref 19–32)
Calcium: 9.4 mg/dL (ref 8.4–10.5)
Chloride: 105 meq/L (ref 96–112)
Creatinine, Ser: 0.93 mg/dL (ref 0.40–1.20)
GFR: 65.93 mL/min
Glucose, Bld: 144 mg/dL — ABNORMAL HIGH (ref 70–99)
Potassium: 4.4 meq/L (ref 3.5–5.1)
Sodium: 138 meq/L (ref 135–145)
Total Bilirubin: 0.4 mg/dL (ref 0.2–1.2)
Total Protein: 7.5 g/dL (ref 6.0–8.3)

## 2024-08-20 LAB — CBC
HCT: 39.5 % (ref 36.0–46.0)
Hemoglobin: 13.1 g/dL (ref 12.0–15.0)
MCHC: 33.2 g/dL (ref 30.0–36.0)
MCV: 82.3 fl (ref 78.0–100.0)
Platelets: 195 K/uL (ref 150.0–400.0)
RBC: 4.8 Mil/uL (ref 3.87–5.11)
RDW: 15.8 % — ABNORMAL HIGH (ref 11.5–15.5)
WBC: 6.1 K/uL (ref 4.0–10.5)

## 2024-08-20 LAB — LIPID PANEL
Cholesterol: 110 mg/dL (ref 28–200)
HDL: 53.4 mg/dL
LDL Cholesterol: 47 mg/dL (ref 10–99)
NonHDL: 56.29
Total CHOL/HDL Ratio: 2
Triglycerides: 46 mg/dL (ref 10.0–149.0)
VLDL: 9.2 mg/dL (ref 0.0–40.0)

## 2024-08-20 LAB — HEMOGLOBIN A1C: Hgb A1c MFr Bld: 6.9 % — ABNORMAL HIGH (ref 4.6–6.5)

## 2024-08-20 LAB — TSH: TSH: 1.14 u[IU]/mL (ref 0.35–5.50)

## 2024-08-20 LAB — VITAMIN D 25 HYDROXY (VIT D DEFICIENCY, FRACTURES): VITD: 24.39 ng/mL — ABNORMAL LOW (ref 30.00–100.00)

## 2024-08-20 MED ORDER — LOSARTAN POTASSIUM 25 MG PO TABS: 25.0000 mg | ORAL_TABLET | Freq: Every day | ORAL | 3 refills | Status: AC

## 2024-08-20 MED ORDER — TRIAMCINOLONE ACETONIDE 0.1 % EX OINT: 1.0000 | TOPICAL_OINTMENT | Freq: Two times a day (BID) | CUTANEOUS | 3 refills | Status: AC | PRN

## 2024-08-20 NOTE — Assessment & Plan Note (Signed)
Chronic BP well controlled Continue losartan 25 mg daily Cmp, cbc

## 2024-08-20 NOTE — Assessment & Plan Note (Signed)
 Acute Has had a few episodes of transient complete loss of vision in her right eye that last 3-4 seconds Has discussed with her eye doctor and no abnormalities seen Will obtain carotid artery ultrasound Further evaluation depending on those results

## 2024-08-20 NOTE — Assessment & Plan Note (Addendum)
 Chronic Associated with hyperlipidemia, hypertension Lab Results  Component Value Date   HGBA1C 7.1 (A) 04/26/2024   Management per Dr. Sam Check urine albumin/creatinine ratio, A1c Continue Jardiance  25 mg daily, glimepiride  1 mg daily, metformin  XR 750 mg twice daily On losartan  25 mg daily, Jardiance  25 mg daily for renal protection Discussed exercise-stressed the importance of making time for regular exercise Diabetic diet

## 2024-08-20 NOTE — Assessment & Plan Note (Signed)
 Chronic Check lipid panel, CMP, TSH Continue crestor  10 mg daily Regular exercise and healthy diet encouraged Encouraged weight loss

## 2024-08-20 NOTE — Assessment & Plan Note (Signed)
 Chronic Taking vitamin D daily Check vitamin D level

## 2024-08-20 NOTE — Assessment & Plan Note (Addendum)
 Chronic Has seen allergy and she states no allergies were identified Using Vanicream daily to prevent dry skin which has helped more than CeraVe Using triamcinolone  daily-advised she should not be using this on a daily basis-discussed long-term effects with chronic use-okay to continue daily as needed and not for more than 14 days in a row Continue Zyrtec  10 mg daily Advised to make an appointment with her dermatologist for further evaluation and treatment

## 2024-08-22 ENCOUNTER — Ambulatory Visit: Payer: Self-pay | Admitting: Internal Medicine

## 2024-08-22 DIAGNOSIS — H547 Unspecified visual loss: Secondary | ICD-10-CM

## 2024-08-23 ENCOUNTER — Other Ambulatory Visit: Payer: Self-pay

## 2024-08-23 ENCOUNTER — Ambulatory Visit (HOSPITAL_COMMUNITY)
Admission: RE | Admit: 2024-08-23 | Discharge: 2024-08-23 | Disposition: A | Discharge status: Home / Self Care | Source: Ambulatory Visit | Attending: Internal Medicine | Admitting: Internal Medicine

## 2024-08-23 DIAGNOSIS — H539 Unspecified visual disturbance: Secondary | ICD-10-CM | POA: Diagnosis present

## 2024-08-23 MED ORDER — TRIAMCINOLONE ACETONIDE 0.1 % EX OINT: 1.0000 | TOPICAL_OINTMENT | Freq: Two times a day (BID) | CUTANEOUS | 3 refills | Status: AC

## 2024-08-24 ENCOUNTER — Encounter: Payer: Self-pay | Admitting: Internal Medicine

## 2024-08-24 DIAGNOSIS — I779 Disorder of arteries and arterioles, unspecified: Secondary | ICD-10-CM | POA: Insufficient documentation

## 2024-08-26 ENCOUNTER — Ambulatory Visit: Admitting: Internal Medicine

## 2024-08-26 ENCOUNTER — Encounter: Payer: Self-pay | Admitting: Internal Medicine

## 2024-08-26 VITALS — BP 138/80 | HR 90 | Ht 62.0 in | Wt 240.0 lb

## 2024-08-26 DIAGNOSIS — E669 Obesity, unspecified: Secondary | ICD-10-CM | POA: Diagnosis not present

## 2024-08-26 DIAGNOSIS — Z6841 Body Mass Index (BMI) 40.0 and over, adult: Secondary | ICD-10-CM | POA: Diagnosis not present

## 2024-08-26 DIAGNOSIS — E785 Hyperlipidemia, unspecified: Secondary | ICD-10-CM | POA: Diagnosis not present

## 2024-08-26 DIAGNOSIS — E1169 Type 2 diabetes mellitus with other specified complication: Secondary | ICD-10-CM | POA: Diagnosis not present

## 2024-08-26 DIAGNOSIS — Z7984 Long term (current) use of oral hypoglycemic drugs: Secondary | ICD-10-CM | POA: Diagnosis not present

## 2024-08-26 MED ORDER — TOPIRAMATE 100 MG PO TABS: 100.0000 mg | ORAL_TABLET | Freq: Every day | ORAL | 3 refills | Status: AC

## 2024-08-26 NOTE — Patient Instructions (Signed)
 Increase  topiramate  100 mg, 1 tablet at bedtime daily  - Continue Glimepiride  1 mg daily  - Continue Metformin  750 mg twice daily  - Continue Jardiance  25 mg daily    HOW TO TREAT LOW BLOOD SUGARS (Blood sugar LESS THAN 70 MG/DL) Please follow the RULE OF 15 for the treatment of hypoglycemia treatment (when your (blood sugars are less than 70 mg/dL)   STEP 1: Take 15 grams of carbohydrates when your blood sugar is low, which includes:  3-4 GLUCOSE TABS  OR 3-4 OZ OF JUICE OR REGULAR SODA OR ONE TUBE OF GLUCOSE GEL    STEP 2: RECHECK blood sugar in 15 MINUTES STEP 3: If your blood sugar is still low at the 15 minute recheck --> then, go back to STEP 1 and treat AGAIN with another 15 grams of carbohydrates. Stop Jardiance  stop Jardiance  start Synjardy  12 point 12-998, 2 tabs daily Synjardy  12.12-998 mg,

## 2024-08-26 NOTE — Progress Notes (Signed)
 "   Name: Jill [name]  Age/ Sex: 64 y.o., female   MRN/ DOB: [id number], 05-06-1961     PCP: Geofm Glade PARAS, MD   Reason for Endocrinology Evaluation: Type 2 Diabetes Mellitus/ Hypercalcemia   Initial Endocrine Consultative Visit: 06/25/2020    PATIENT IDENTIFIER: Jill [name] is a 64 y.o. female with a past medical history of T2DM, HTN, Ashtma and Primary Hyperparathyroidism ( S/P parathyroidectomy 2022). The patient has followed with Endocrinology clinic since 06/25/2020 for consultative assistance with management of her diabetes.  DIABETIC HISTORY:  Jill [name] was diagnosed with DM > 30 yrs ago. Ozempic  caused GI side effects. No reported intolerance to Trajenta .She was started on Glipizide  and Jardiance  in 08/2020 Her hemoglobin A1c has ranged from  6.7% in 2019 to 11.7% in 2021   Glipizide  was stopped by PCP , we restarted at smaller dose in 12/2020   We stopped Kombiglyze and switch Jardiance  to Synjardy  by April 2023 Started Trulicity  April 2023, this was eventually switched to Mounjaro  in 2023  Glipizide  stopped 03/2022 with an A1c of 6.7%   Glimepiride  started 09/2022 with an A1c of 9.4%  Mounjaro  discontinued 2024 due to cost  Was provided with pt assistance forms 06/2023 for Jardiance  but wasn't filled   Started topiramate  in September, 2025 for weight management  Hyperparathyroid History  The patient was first diagnosed with hypercalcemia in 2015, with maximum serum calcium  level of  level 11.2 mg/dL ( Non corrected)    Her 24- hr urinary Ca/Cr ratio of 0.012 with a 191 mg of 24-hr calcium  (06/2020) DXA (10/06/2020) low bone density    She met surgical criteria  due to low GFR  She is S/P left inferior parathyroidectomy 05/24/2021  SUBJECTIVE:   During the last visit (04/26/2024): A1c 7.1%     Today (08/26/2024): Jill [name] is here for follow-up on diabetes management.  She has not been able to use Dexcom due to cost issues.  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, follows with ophthalmology and PCP, most recent lipid panel is optimal, she has had carotid ultrasound, these records are not available to me   HOME DIABETES REGIMEN:  -Glimepiride  1 mg daily -Metformin  750 mg BID  -Jardiance  25 daily -Topiramate  50 mg daily -Rosuvastatin  10 mg daily     Statin: yes ACE-I/ARB: yes    CONTINUOUS GLUCOSE MONITORING RECORD INTERPRETATION : Cost prohibitive      DIABETIC COMPLICATIONS: Microvascular complications:  CKD III Denies: Neuropathy, retinopathy Last Eye Exam: Completed 03/2021  Macrovascular complications:   Denies: CAD, CVA, PVD   HISTORY:  Past Medical History:  Past Medical History:  Diagnosis Date   Allergy    Anemia    Arthritis    Asthma    10yrs ago   Chronic deafness of both ears    left ear has 40% hearing-right ear has 60 % hearing   Colon polyps    Complication of anesthesia    Hard to wake up   Constipation    Diabetes mellitus without complication (HCC)    Heartburn    High cholesterol    Migraines  none in last 11 yrs   Obesity    Pelvic kidney    left   Pneumonia    Post partum depression    Urinary incontinence    Past Surgical History:  Past Surgical History:  Procedure Laterality Date   CESAREAN SECTION     COLONOSCOPY WITH PROPOFOL  N/A 03/24/2015   Procedure: COLONOSCOPY WITH PROPOFOL ;  Surgeon: Renaye Sous, MD;  Location: [facility];  Service: Endoscopy;  Laterality: N/A;   HEMORRHOID BANDING  08/2017, 09/2017   KNEE ARTHROSCOPY WITH MEDIAL MENISECTOMY Left 04/17/2014   Procedure: LEFT KNEE ARTHROSCOPY WITH PARTIAL MEDIAL MENISCECTOMY AND  CHONDROPLASTY;  Surgeon: Reyes JAYSON Billing, MD;  Location: [facility];  Service: Orthopedics;  Laterality: Left;   PARATHYROIDECTOMY Left 05/24/2021   Procedure: LEFT INFERIOR PARATHYROIDECTOMY, POSSIBLE NECK EXPLORATION;  Surgeon: Eletha Boas, MD;  Location: [facility];  Service: General;  Laterality: Left;   TYMPANOSTOMY TUBE PLACEMENT Right    Social History:  reports that she has never smoked. She has never used smokeless tobacco. She reports that she does not currently use alcohol. She reports that she does not use drugs. Family History:  Family History  Problem Relation Age of Onset   Hypertension Mother    Arthritis Mother    Hypercalcemia Mother    Obesity Mother    Allergic rhinitis Mother    Asthma Mother    Hypertension Sister    Deep vein thrombosis Sister    Pulmonary embolism Sister    Asthma Daughter    Diabetes Maternal Grandmother    Heart failure Maternal Grandmother    Heart disease Maternal Grandmother    Hyperlipidemia Maternal Grandmother    Hypertension Maternal Grandmother    Hypertension Maternal Grandfather    Hyperlipidemia Maternal Grandfather    Breast cancer Paternal Aunt        diagnosed in her 74's   Breast cancer Paternal Grandmother      HOME MEDICATIONS: Allergies as of 08/26/2024       Reactions   Linzess  [linaclotide ] Other (See Comments)   Abdominal pain    Pravastatin  Other (See Comments)   Body aches   Adhesive [tape]    Pulls skin off   Atorvastatin     Muscle aches, stomach upset   Latex Itching        Medication List        Accurate as of August 26, 2024  8:21 AM. If you have any questions, ask your nurse or doctor.          STOP taking these medications    Glucose Control Soln Stopped by: Donell Butts, MD       TAKE these medications    Blood Glucose Monitoring Suppl Devi 1 each by Does not apply route in the morning, at noon, and at bedtime. May substitute to any manufacturer covered by patient's insurance.  E11.9   cetirizine  10 MG tablet Commonly known as: ZYRTEC  Take 1 tablet (10 mg total) by mouth 2 (two) times daily.   empagliflozin  25 MG Tabs tablet Commonly known as: Jardiance  Take 1 tablet (25 mg total) by mouth daily before breakfast.   glimepiride  1 MG tablet Commonly known as: AMARYL  Take 1 tablet (1 mg total) by mouth daily with breakfast.   losartan  25 MG tablet Commonly known as: COZAAR  Take 1 tablet (25 mg total) by mouth daily.   metFORMIN  750 MG 24 hr tablet Commonly known as: GLUCOPHAGE -XR Take 1 tablet (750 mg total) by mouth 2 (two) times daily.  rosuvastatin  10 MG tablet Commonly known as: Crestor  Take 1 tablet (10 mg total) by mouth daily.   topiramate  50 MG tablet Commonly known as: Topamax  Take 1 tablet (50 mg total) by mouth at bedtime.   triamcinolone  ointment 0.1 % Commonly known as: KENALOG  Apply 1 Application topically 2 (two) times daily as needed (Do not use for more than 14 days consistently).   triamcinolone  ointment 0.1 % Commonly known as: KENALOG  Apply 1 Application topically 2 (two) times daily.         OBJECTIVE:   Vital Signs: BP 138/80   Pulse 90   Ht 5' 2 (1.575 m)   Wt 240 lb (108.9 kg)   SpO2 99%   BMI 43.90 kg/m   Wt Readings from Last 3 Encounters:  08/26/24 240 lb (108.9 kg)  08/20/24 241 lb (109.3 kg)  04/26/24 245 lb (111.1 kg)     Exam: General: Pt appears well and is in NAD  Lungs: Clear with good BS bilat   Heart: RRR   Extremities: Trace pretibial edema.   Neuro: MS is good with appropriate affect, pt is alert and Ox3     DM Foot Exam 01/24/2024 per podiatry     DATA REVIEWED:  Lab Results  Component Value Date   HGBA1C 6.9 (H) 08/20/2024   HGBA1C 7.1 (A) 04/26/2024   HGBA1C 6.8 (A) 10/25/2023    Latest Reference Range & Units 08/20/24 10:37  Sodium 135 - 145 mEq/L 138  Potassium 3.5 - 5.1 mEq/L 4.4  Chloride 96 - 112 mEq/L 105  CO2 19 - 32 mEq/L 25  Glucose 70 - 99 mg/dL 855 (H)   BUN 6 - 23 mg/dL 17  Creatinine 9.59 - 8.79 mg/dL 9.06  Calcium  8.4 - 10.5 mg/dL 9.4  Alkaline Phosphatase 39 - 117 U/L 81  Albumin 3.5 - 5.2 g/dL 4.1  AST 5 - 37 U/L 11  ALT 3 - 35 U/L 14  Total Protein 6.0 - 8.3 g/dL 7.5  Total Bilirubin 0.2 - 1.2 mg/dL 0.4  GFR >39.99 mL/min 65.93  Total CHOL/HDL Ratio  2  Cholesterol 28 - 200 mg/dL 889  HDL Cholesterol >60.99 mg/dL 46.59  LDL (calc) 10 - 99 mg/dL 47  MICROALB/CREAT RATIO 0.0 - 30.0 mg/g 13.6  NonHDL  56.29  Triglycerides 89.9 - 149.0 mg/dL 53.9  VLDL 0.0 - 59.9 mg/dL 9.2    Latest Reference Range & Units 08/20/24 10:37  Creatinine,U mg/dL 854.0  Microalb, Ur 0.7 - 1.9 mg/dL 2.0 (H)  MICROALB/CREAT RATIO 0.0 - 30.0 mg/g 13.6  (H): Data is abnormally high   Old records , labs and images have been reviewed.   ASSESSMENT / PLAN / RECOMMENDATIONS:   1) Type 2 Diabetes Mellitus, Optimally  controlled, Without complications - Most recent A1c of 6.9 %. Goal A1c < 7.0 %.     -Most recent A1c has been optimal through PCPs office -Dexcom has been cost prohibitive - Patient with social determinants, patient states she is unable to afford glucose testing strips even with $0.20 -Patient will be provided with Jardiance  samples #30 , she did drop off patient assistance for Jardiance  - No changes to glycemic agents   MEDICATIONS: - Contine Glimepiride  1 mg, 1 tablet before breakfast - Continue metformin  750 mg twice daily - Continue Jardiance  25 mg daily    EDUCATION / INSTRUCTIONS: BG monitoring instructions: Patient is instructed to check her blood sugars 3  times a day, before meals . Call [facility] Endocrinology clinic  if: BG persistently < 70 I reviewed the Rule of 15 for the treatment of hypoglycemia in detail with the patient. Literature supplied.  2) Diabetic complications:  Eye: Does not have known diabetic retinopathy.  Neuro/ Feet: Does not have known diabetic peripheral neuropathy .  Renal: Patient does  have  known baseline CKD. She   is not on an ACEI/ARB at present.      3) Dyslipidemia:   -She has reported prior  intolerance to daily rosuvastatin  and atorvastatin  in the past   - We stopped Pravastatin  and started weekly Rosuvastatin  12/2020 -Following parathyroidectomy, the patient has noted resolution of joint aches and pains and increased rosuvastatin  to daily without any side effects 2024  - LDL remains at goal  Medication   Continue Rosuvastatin  10 mg daily     4) Obesity:  - Patient has been oral weight loss - She has started lifestyle changes with avoiding sugar sweetened beverages, choosing 0 sugar drinks/foods as well as started daily exercise - GLP-1 agonist have been cost prohibitive - She is tolerating topiramate , will increase  Medication Increase topiramate  100 mg, 1 tablet at bedtime    F/U in 6 months     Signed electronically by: Stefano Redgie Butts, MD  [facility]  [facility] [address] [place] [place], [place] [address] Phone: [phone] FAX: [phone]   CC: Geofm Glade PARAS, MD [address] [place] [address] Phone: [phone]  Fax: [phone]  Return to Endocrinology clinic as below: Future Appointments  Date Time Provider Department Center  12/23/2024 10:35 AM Cleotilde Ronal RAMAN, MD [facility] [id number]  01/23/2025  1:15 PM Magdalen Pasco RAMAN, DPM [facility]  02/17/2025  9:30 AM Geofm, Glade PARAS, MD [facility]    "

## 2024-08-27 DIAGNOSIS — E1169 Type 2 diabetes mellitus with other specified complication: Secondary | ICD-10-CM

## 2024-08-28 MED ORDER — EMPAGLIFLOZIN 25 MG PO TABS: 25.0000 mg | ORAL_TABLET | Freq: Every day | ORAL | 3 refills | Status: AC

## 2024-08-28 NOTE — Addendum Note (Signed)
 Addended by: Paxten Appelt M on: 08/28/2024 09:06 AM   Modules accepted: Orders

## 2024-09-04 ENCOUNTER — Ambulatory Visit: Payer: Self-pay | Admitting: Internal Medicine

## 2024-09-04 ENCOUNTER — Inpatient Hospital Stay: Admission: RE | Admit: 2024-09-04 | Discharge: 2024-09-04 | Attending: Internal Medicine

## 2024-09-04 DIAGNOSIS — H547 Unspecified visual loss: Secondary | ICD-10-CM

## 2024-09-04 MED ORDER — GADOPICLENOL 0.5 MMOL/ML IV SOLN
10.0000 mL | Freq: Once | INTRAVENOUS | Status: AC | PRN
  Administered 2024-09-04: 10 mL via INTRAVENOUS

## 2024-12-11 ENCOUNTER — Ambulatory Visit (HOSPITAL_BASED_OUTPATIENT_CLINIC_OR_DEPARTMENT_OTHER): Admitting: Orthopaedic Surgery

## 2024-12-23 ENCOUNTER — Ambulatory Visit (HOSPITAL_BASED_OUTPATIENT_CLINIC_OR_DEPARTMENT_OTHER): Admitting: Obstetrics & Gynecology

## 2025-01-23 ENCOUNTER — Ambulatory Visit: Admitting: Podiatry

## 2025-02-17 ENCOUNTER — Ambulatory Visit: Admitting: Internal Medicine

## 2025-03-10 ENCOUNTER — Ambulatory Visit: Admitting: Internal Medicine
